# Patient Record
Sex: Female | Born: 1945 | Race: White | Hispanic: No | Marital: Married | Smoking: Former smoker
Health system: Southern US, Community
[De-identification: ages and names within clinical notes are randomized; demographics above are authoritative.]

## PROBLEM LIST (undated history)

## (undated) DIAGNOSIS — E119 Type 2 diabetes mellitus without complications: Secondary | ICD-10-CM

## (undated) DIAGNOSIS — F988 Other specified behavioral and emotional disorders with onset usually occurring in childhood and adolescence: Secondary | ICD-10-CM

## (undated) DIAGNOSIS — G4730 Sleep apnea, unspecified: Secondary | ICD-10-CM

## (undated) DIAGNOSIS — F419 Anxiety disorder, unspecified: Secondary | ICD-10-CM

## (undated) DIAGNOSIS — C50919 Malignant neoplasm of unspecified site of unspecified female breast: Secondary | ICD-10-CM

## (undated) DIAGNOSIS — R51 Headache: Secondary | ICD-10-CM

## (undated) DIAGNOSIS — R519 Headache, unspecified: Secondary | ICD-10-CM

## (undated) DIAGNOSIS — K219 Gastro-esophageal reflux disease without esophagitis: Secondary | ICD-10-CM

## (undated) DIAGNOSIS — M542 Cervicalgia: Secondary | ICD-10-CM

## (undated) DIAGNOSIS — E7800 Pure hypercholesterolemia, unspecified: Secondary | ICD-10-CM

## (undated) DIAGNOSIS — E669 Obesity, unspecified: Secondary | ICD-10-CM

## (undated) DIAGNOSIS — M25472 Effusion, left ankle: Secondary | ICD-10-CM

## (undated) DIAGNOSIS — M1990 Unspecified osteoarthritis, unspecified site: Secondary | ICD-10-CM

## (undated) DIAGNOSIS — Z923 Personal history of irradiation: Secondary | ICD-10-CM

## (undated) DIAGNOSIS — M25471 Effusion, right ankle: Secondary | ICD-10-CM

## (undated) HISTORY — PX: TUBAL LIGATION: SHX77

## (undated) HISTORY — PX: JOINT REPLACEMENT: SHX530

## (undated) HISTORY — PX: BREAST LUMPECTOMY: SHX2

## (undated) HISTORY — DX: Cervicalgia: M54.2

## (undated) HISTORY — DX: Headache, unspecified: R51.9

## (undated) HISTORY — PX: ABDOMINAL HYSTERECTOMY: SHX81

## (undated) HISTORY — PX: PAROTID GLAND TUMOR EXCISION: SHX5221

## (undated) HISTORY — PX: TONSILLECTOMY: SUR1361

## (undated) HISTORY — PX: OTHER SURGICAL HISTORY: SHX169

## (undated) HISTORY — PX: CHOLECYSTECTOMY: SHX55

## (undated) HISTORY — DX: Headache: R51

---

## 1997-10-22 ENCOUNTER — Other Ambulatory Visit: Admission: RE | Admit: 1997-10-22 | Discharge: 1997-10-22 | Payer: Self-pay | Admitting: *Deleted

## 1998-03-01 ENCOUNTER — Ambulatory Visit (HOSPITAL_COMMUNITY): Admission: RE | Admit: 1998-03-01 | Discharge: 1998-03-01 | Payer: Self-pay | Admitting: Gastroenterology

## 1999-01-06 ENCOUNTER — Other Ambulatory Visit: Admission: RE | Admit: 1999-01-06 | Discharge: 1999-01-06 | Payer: Self-pay | Admitting: *Deleted

## 1999-11-25 ENCOUNTER — Ambulatory Visit (HOSPITAL_COMMUNITY): Admission: RE | Admit: 1999-11-25 | Discharge: 1999-11-25 | Payer: Self-pay | Admitting: Family Medicine

## 2000-01-26 ENCOUNTER — Other Ambulatory Visit: Admission: RE | Admit: 2000-01-26 | Discharge: 2000-01-26 | Payer: Self-pay | Admitting: *Deleted

## 2000-06-05 ENCOUNTER — Encounter: Admission: RE | Admit: 2000-06-05 | Discharge: 2000-06-05 | Payer: Self-pay | Admitting: Family Medicine

## 2000-06-05 ENCOUNTER — Encounter: Payer: Self-pay | Admitting: Family Medicine

## 2001-01-02 ENCOUNTER — Other Ambulatory Visit: Admission: RE | Admit: 2001-01-02 | Discharge: 2001-01-02 | Payer: Self-pay | Admitting: Obstetrics and Gynecology

## 2001-02-15 ENCOUNTER — Encounter: Admission: RE | Admit: 2001-02-15 | Discharge: 2001-02-15 | Payer: Self-pay | Admitting: Family Medicine

## 2001-02-15 ENCOUNTER — Encounter: Payer: Self-pay | Admitting: Family Medicine

## 2001-02-27 ENCOUNTER — Encounter (INDEPENDENT_AMBULATORY_CARE_PROVIDER_SITE_OTHER): Payer: Self-pay | Admitting: *Deleted

## 2001-02-27 ENCOUNTER — Ambulatory Visit (HOSPITAL_COMMUNITY): Admission: RE | Admit: 2001-02-27 | Discharge: 2001-02-27 | Payer: Self-pay | Admitting: Gastroenterology

## 2002-03-07 ENCOUNTER — Ambulatory Visit (HOSPITAL_COMMUNITY): Admission: RE | Admit: 2002-03-07 | Discharge: 2002-03-07 | Payer: Self-pay | Admitting: Obstetrics and Gynecology

## 2002-03-07 ENCOUNTER — Encounter: Payer: Self-pay | Admitting: Obstetrics and Gynecology

## 2002-04-11 ENCOUNTER — Other Ambulatory Visit: Admission: RE | Admit: 2002-04-11 | Discharge: 2002-04-11 | Payer: Self-pay | Admitting: Obstetrics and Gynecology

## 2002-05-01 ENCOUNTER — Encounter (INDEPENDENT_AMBULATORY_CARE_PROVIDER_SITE_OTHER): Payer: Self-pay | Admitting: Specialist

## 2002-05-01 ENCOUNTER — Ambulatory Visit (HOSPITAL_COMMUNITY): Admission: RE | Admit: 2002-05-01 | Discharge: 2002-05-01 | Payer: Self-pay | Admitting: Obstetrics and Gynecology

## 2003-06-20 DIAGNOSIS — Z923 Personal history of irradiation: Secondary | ICD-10-CM

## 2003-06-20 HISTORY — DX: Personal history of irradiation: Z92.3

## 2003-06-22 ENCOUNTER — Encounter: Admission: RE | Admit: 2003-06-22 | Discharge: 2003-06-22 | Payer: Self-pay | Admitting: Obstetrics and Gynecology

## 2003-06-26 ENCOUNTER — Encounter (INDEPENDENT_AMBULATORY_CARE_PROVIDER_SITE_OTHER): Payer: Self-pay | Admitting: *Deleted

## 2003-06-26 ENCOUNTER — Encounter: Admission: RE | Admit: 2003-06-26 | Discharge: 2003-06-26 | Payer: Self-pay | Admitting: Obstetrics and Gynecology

## 2003-06-26 ENCOUNTER — Encounter (INDEPENDENT_AMBULATORY_CARE_PROVIDER_SITE_OTHER): Payer: Self-pay | Admitting: Obstetrics and Gynecology

## 2003-07-06 ENCOUNTER — Encounter: Admission: RE | Admit: 2003-07-06 | Discharge: 2003-07-06 | Payer: Self-pay | Admitting: General Surgery

## 2003-07-07 ENCOUNTER — Encounter (HOSPITAL_COMMUNITY): Admission: RE | Admit: 2003-07-07 | Discharge: 2003-10-05 | Payer: Self-pay | Admitting: General Surgery

## 2003-07-09 ENCOUNTER — Encounter (INDEPENDENT_AMBULATORY_CARE_PROVIDER_SITE_OTHER): Payer: Self-pay | Admitting: Specialist

## 2003-07-09 ENCOUNTER — Ambulatory Visit (HOSPITAL_COMMUNITY): Admission: RE | Admit: 2003-07-09 | Discharge: 2003-07-09 | Payer: Self-pay | Admitting: General Surgery

## 2003-07-09 ENCOUNTER — Ambulatory Visit (HOSPITAL_BASED_OUTPATIENT_CLINIC_OR_DEPARTMENT_OTHER): Admission: RE | Admit: 2003-07-09 | Discharge: 2003-07-09 | Payer: Self-pay | Admitting: General Surgery

## 2003-07-09 ENCOUNTER — Encounter: Admission: RE | Admit: 2003-07-09 | Discharge: 2003-07-09 | Payer: Self-pay | Admitting: General Surgery

## 2003-08-03 ENCOUNTER — Other Ambulatory Visit: Admission: RE | Admit: 2003-08-03 | Discharge: 2003-08-03 | Payer: Self-pay | Admitting: Obstetrics and Gynecology

## 2003-08-11 ENCOUNTER — Ambulatory Visit: Admission: RE | Admit: 2003-08-11 | Discharge: 2003-10-23 | Payer: Self-pay | Admitting: Radiation Oncology

## 2003-08-14 ENCOUNTER — Encounter: Admission: RE | Admit: 2003-08-14 | Discharge: 2003-08-14 | Payer: Self-pay | Admitting: Family Medicine

## 2003-11-12 ENCOUNTER — Ambulatory Visit: Admission: RE | Admit: 2003-11-12 | Discharge: 2003-11-12 | Payer: Self-pay | Admitting: Radiation Oncology

## 2004-02-19 ENCOUNTER — Encounter: Admission: RE | Admit: 2004-02-19 | Discharge: 2004-02-19 | Payer: Self-pay | Admitting: General Surgery

## 2004-08-04 ENCOUNTER — Other Ambulatory Visit: Admission: RE | Admit: 2004-08-04 | Discharge: 2004-08-04 | Payer: Self-pay | Admitting: Obstetrics and Gynecology

## 2004-08-04 ENCOUNTER — Encounter: Admission: RE | Admit: 2004-08-04 | Discharge: 2004-08-04 | Payer: Self-pay | Admitting: General Surgery

## 2005-09-06 ENCOUNTER — Encounter: Admission: RE | Admit: 2005-09-06 | Discharge: 2005-09-06 | Payer: Self-pay | Admitting: General Surgery

## 2005-10-26 ENCOUNTER — Other Ambulatory Visit: Admission: RE | Admit: 2005-10-26 | Discharge: 2005-10-26 | Payer: Self-pay | Admitting: Obstetrics and Gynecology

## 2005-11-14 ENCOUNTER — Encounter: Admission: RE | Admit: 2005-11-14 | Discharge: 2005-11-14 | Payer: Self-pay | Admitting: General Surgery

## 2006-01-04 ENCOUNTER — Ambulatory Visit (HOSPITAL_COMMUNITY): Admission: RE | Admit: 2006-01-04 | Discharge: 2006-01-05 | Payer: Self-pay | Admitting: Obstetrics and Gynecology

## 2006-01-04 ENCOUNTER — Encounter (INDEPENDENT_AMBULATORY_CARE_PROVIDER_SITE_OTHER): Payer: Self-pay | Admitting: *Deleted

## 2006-08-15 ENCOUNTER — Ambulatory Visit (HOSPITAL_COMMUNITY): Admission: RE | Admit: 2006-08-15 | Discharge: 2006-08-15 | Payer: Self-pay | Admitting: Family Medicine

## 2006-08-19 ENCOUNTER — Ambulatory Visit: Payer: Self-pay | Admitting: Internal Medicine

## 2006-10-15 ENCOUNTER — Encounter: Admission: RE | Admit: 2006-10-15 | Discharge: 2006-10-15 | Payer: Self-pay | Admitting: Obstetrics and Gynecology

## 2006-12-17 ENCOUNTER — Ambulatory Visit: Payer: Self-pay | Admitting: Internal Medicine

## 2006-12-17 ENCOUNTER — Observation Stay (HOSPITAL_COMMUNITY): Admission: EM | Admit: 2006-12-17 | Discharge: 2006-12-19 | Payer: Self-pay | Admitting: Emergency Medicine

## 2006-12-18 ENCOUNTER — Encounter (INDEPENDENT_AMBULATORY_CARE_PROVIDER_SITE_OTHER): Payer: Self-pay | Admitting: Hospitalist

## 2006-12-24 ENCOUNTER — Encounter (HOSPITAL_COMMUNITY): Admission: RE | Admit: 2006-12-24 | Discharge: 2006-12-25 | Payer: Self-pay | Admitting: Internal Medicine

## 2007-10-17 ENCOUNTER — Encounter: Admission: RE | Admit: 2007-10-17 | Discharge: 2007-10-17 | Payer: Self-pay | Admitting: Obstetrics and Gynecology

## 2008-10-09 ENCOUNTER — Encounter: Admission: RE | Admit: 2008-10-09 | Discharge: 2008-10-09 | Payer: Self-pay | Admitting: Sports Medicine

## 2008-10-20 ENCOUNTER — Encounter: Admission: RE | Admit: 2008-10-20 | Discharge: 2008-10-20 | Payer: Self-pay | Admitting: Obstetrics and Gynecology

## 2010-07-09 ENCOUNTER — Encounter: Payer: Self-pay | Admitting: Obstetrics and Gynecology

## 2010-11-01 NOTE — Discharge Summary (Signed)
NAME:  [name], [name]              ACCOUNT NO.:  [id number]   MEDICAL RECORD NO.:  [id number]          PATIENT TYPE:  OBV   LOCATION:  4739                         FACILITY:  [facility]   PHYSICIAN:  Jason Coop, MD DATE OF BIRTH:  October 15, 1945   DATE OF ADMISSION:  12/17/2006  DATE OF DISCHARGE:  12/19/2006                               DISCHARGE SUMMARY   DISCHARGE DIAGNOSES:  1. Atypical chest pain.  2. Hypokalemia.  3. Questionable hyperthyroidism.  4. Osteopenia.  5. Decreased vitamin D.  6. Depression.  7. Attention deficit disorder.  8. Obstructive sleep apnea.   MEDICATIONS ON DISCHARGE:  1. Aspirin 325 mg PO daily.  2. Lipitor 40 mg PO daily.  3. Lopressor 25 mg PO b.i.d.  4. Nitroglycerin 25 mg 1 spray for chest pain, can repeat in 5      minutes, total times 3. If still persistent call 911.  5. Calcium and vitamin D  take 1 pill p.o. tid.  6. Nasonex spray 2 sprays daily.  7. Protonix 40 mg PO daily.  8. Ritalin 10 mg PO daily.   DISPOSITION:  She is discharged home.  She will be seen by her primary  care physician Dr. Purnell Shoemaker and the patient will schedule the appointment  herself. She needs follow up on HTN and anti-hypertensive medications,  any persisting signs of chest pain,  any persisting signs of  hyperthyroidism and depression. She is scheduled for radioactive iodine  uptake on December 24, 2006 and will follow up with her PCP.   PROCEDURE:  1. 2D echo, dated July 1, ejection fraction of around 60-70%.  No left      ventricular regional wall motion abnormality.  The left ventricle      diastolic parameters are normal.  Left atrial size is normal.  2. Myoview shows normal left ventricular function with an estimated      ejection fraction of 71%.  No evidence of ischemia, apical thinning      was present.   CONSULTATIONS:  Cardiology.   BRIEF HISTORY ON ADMISSION:  She is a 65 year old female with past  history significant for OSA, hiatal hernia, right  breast cancer,  presented with a 24 hour history of chest pain.  It was central, 6-7/10,  nonradiating, dull and it started while she was eating breakfast.  It  occurred multiple times over the last 24 hours, each time lasting 1-2  minutes.  There is no aggravating or alleviating factor.  No associated  nausea or vomiting.  It does spread to her right side and patient took  nitroglycerin spray and aspirin, but there was no significant relief.   EXAMINATION:  VITAL SIGNS: Temperature 98.3.  BP 112/42.  Pulse 68-70.  Oxygen saturation 90% on 2 liters.  GENERAL:  She is obese and alert.  Her BMI is 36.  NECK:  JVD is negative.  RESPIRATORY SYSTEM:  Clear to auscultation bilaterally.  CARDIOVASCULAR:  Normal.  Regular rate and rhythm.  First and second  heart sound normal.  EXTREMITIES:  No edema, swelling.  No cyanosis.   LABS ON ADMISSION:  WBC  6.9, hemoglobin 12.6, hematocrit 37.3, platelets  264.  Sodium 140, potassium 3.3, chloride 104, bicarb 27, BUN 14,  creatinine 0.8 and glucose 106. PT 13, PTT 33, INR 1.0.  D-dimer is  0.38.  Cardiac enzyme WNL.  Magnesium 1.9.  Her TSH is 0.005, but free  T4 normal at 1.21 and her T3 3.1.  Her EKG was normal at admission.   HOSPITAL COURSE:  1. Chest Pain: We followed with repeated EKG and cardiac enzymes.      Cardiac enzymes were WNL and EKG was negative for new changes. 2D      echo and Myoview were WNL  and had ejection fraction of 60-70, no      wall motion abnormalities and no evidence of ischemia.  2. Questionable hyperthyroidism: She is scheduled her for radioactive      iodine uptake and she will follow it with her primary care      physician.  3. Hypokalemia: On potassium and her potassium at discharge is 3.6.  4. OSA:  On CPAP  5. For depression we continued her previous medication.  6. Attention deficit disorder: Ritalin.  7. Vitamin D deficiency and her osteopenia:  On calcium and vitamin D.   DISCHARGE LABS:  Prolactin 7.5.   FSH 77.8.  Lipid profile:  TG 185, HDL  54, LDL 128, VLDL 37,  potassium 3.6 and creatinine 0.76.   Vitals on discharge  T: 98.1 BP 110/58 P: 58 and R: 18 Oxygen sat 95 on RA      Jason Coop, MD  Electronically Signed     YP/MEDQ  D:  12/20/2006  T:  12/21/2006  Job:  [id number]   cc:   Lianne Bushy, M.D.

## 2010-11-04 NOTE — Op Note (Signed)
NAME:  [name], [name]                        ACCOUNT NO.:  [id number]   MEDICAL RECORD NO.:  [id number]                   PATIENT TYPE:  AMB   LOCATION:  DSC                                  FACILITY:  [facility]   PHYSICIAN:  Rose Phi. Young, M.D.                DATE OF BIRTH:  [birth year]   DATE OF PROCEDURE:  07/09/2003  DATE OF DISCHARGE:                                 OPERATIVE REPORT   PREOPERATIVE DIAGNOSIS:  Intraductal carcinoma of the right breast.   POSTOPERATIVE DIAGNOSIS:  Intraductal carcinoma of the right breast.   PROCEDURE:  Right partial mastectomy with needle localization and specimen  mammography.   SURGEON:  Rose Phi. Maple Hudson, M.D.   ANESTHESIA:  General.   DESCRIPTION OF PROCEDURE:  Prior to coming to the operating room, the  patient had a wire localization of the lesion in the medial portion of her  right breast.   After suitable general anesthesia was induced, the patient was placed in the  supine position and the right breast prepped and draped in the usual  fashion.  A radial incision incorporating a wedge of skin using the  previously placed wire as a guide was then outlined and made and then a wide  excision of the wire and surrounding tissue was carried out.  Specimen  mammography confirmed the removal of the lesion.  Hemostasis was obtained  with the cautery.  We then infiltrated the area with 0.25% Marcaine; 3-0  Vicryl and then subcuticular 4-0 Monocryl sutures were used to close the  wound and Steri-Strips were applied.  A dressing was then applied and the  patient transferred to the recovery room in satisfactory condition having  tolerated the procedure well.                                               Rose Phi. Maple Hudson, M.D.    PRY/MEDQ  D:  07/09/2003  T:  07/09/2003  Job:  [id number]

## 2010-11-04 NOTE — H&P (Signed)
   NAME:  [name], [name] NO.:  [id number]   MEDICAL RECORD NO.:  [id number]                   PATIENT TYPE:   LOCATION:                                       FACILITY:   PHYSICIAN:  Huel Cote, M.D.              DATE OF BIRTH:   DATE OF ADMISSION:  05/01/2002  DATE OF DISCHARGE:                                HISTORY & PHYSICAL   HISTORY OF PRESENT ILLNESS:  The patient is a 65 year old G2 P2 who is  admitted to undergo an operative hysteroscopy, dilatation and curettage  given a history of postmenopausal bleeding and a thickened endometrial  stripe on ultrasound.  The patient first began to experience postmenopausal  bleeding in September 2003, and at that point underwent an endometrial  biopsy which revealed disordered proliferative endometrium with a stripe on  ultrasound measuring 1.5 cm.  She has continued to have spotting although no  heavy bleeding.   PAST MEDICAL HISTORY:  1. History of a parotid tumor.  2. History of borderline hypercholesterolemia.   PAST SURGICAL HISTORY:  1. Cholecystectomy.  2. Parotid tumor resection.  3. Bilateral tubal ligation.  4. D&C.   PAST OBSTETRICAL HISTORY:  She had two normal spontaneous vaginal  deliveries.   PAST GYNECOLOGICAL HISTORY:  History of cervical conization approximately 15  years ago, with normal Pap smears after that point.   MEDICATIONS:  Prempro.  Wellbutrin.  Hydrochlorothiazide.  Allegra.   FAMILY HISTORY:  Significant for breast cancer in two maternal aunts and  colon cancer in her father.  Her mother also had a history of colon polyps.   ALLERGIES:  The patient has no known drug allergies.   PHYSICAL EXAMINATION:  VITAL SIGNS:  Blood pressure 130/90.  Weight 202  pounds.  BREASTS:  Normal.  With no masses, discharge, or adenopathy noted.  CARDIAC:  Regular rate and rhythm.  LUNGS:  Clear.  ABDOMEN:  Soft and nontender.  PELVIC:  Normal external genitalia.  Cervix is  normal, with no lesions.  The  uterus is normal in size.  The adnexa have no masses.  RECTAL:  Heme-negative.    PLAN:  The patient was counseled of the risks and benefits of operative  hysteroscopy including bleeding and infection and possible uterine  perforation.  She understands these risks and is desirous of proceeding with  the surgery as stated.  If there is any uterine pathology including a polyp  or submucosal fibroid, she understands we will resect that at the time of  surgery.                                               Huel Cote, M.D.    KR/MEDQ  D:  04/25/2002  T:  04/25/2002  Job:  [id number]

## 2010-11-04 NOTE — Op Note (Signed)
NAME:  [name], [name]              ACCOUNT NO.:  [id number]   MEDICAL RECORD NO.:  [id number]          PATIENT TYPE:  INP   LOCATION:  9399                          FACILITY:  [facility]   PHYSICIAN:  Huel Cote, M.D. DATE OF BIRTH:  06-16-1946   DATE OF PROCEDURE:  01/04/2006  DATE OF DISCHARGE:                                 OPERATIVE REPORT   PREOP DIAGNOSIS:  1.  Pelvic pain.  2.  Dyspareunia.  3.  Left ovarian mass.   POSTOP DIAGNOSIS:  1.  Pelvic pain.  2.  Dyspareunia.  3.  Left ovarian mass.  4.  Apparent simple cyst of left ovary.   PROCEDURE:  1.  Laparoscopic-assisted vaginal hysterectomy.  2.  Bilateral salpingo-oophorectomy and posterior repair.   SURGEON:  Huel Cote, M.D.   ASSISTANT:  Zenaida Niece, M.D.   ANESTHESIA:  General.   SPECIMENS:  Uterus, tubes, ovaries, and cervix were sent to pathology.   ESTIMATED BLOOD LOSS:  250 mL.  Urine output 200 mL clear urine.  IV fluids  2500 LR.   COMPLICATIONS:  Just as the patient was getting ready to be moved off the  table, there was some bleeding noted from the vagina which seemed excessive;  therefore, the packing was removed; and the vagina reinspected.  There was  found to be a small arterial bleeder along the posterior repair which  required two additional figure-of-eight sutures, and gained excellent  hemostasis, at this point; therefore, the packing was replaced and the  patient awakened.   FINDINGS:  There was a small uterus.  The left ovary had an apparent simple  cyst approximately 2 cm in size.  The right ovary was normal.  Posterior  repair was necessary given a large rectocele.  There was no significant  cystocele noted; after the removal of the cervix and uterus.   DESCRIPTION OF PROCEDURE:  The patient was taken to operating room where  general anesthesia was obtained without difficulty.  She was then prepped  and draped in the normal sterile fashion in the dorsal lithotomy  position.  The patient had a Foley catheter placed and attention was then turned to her  abdomen where infraumbilical incision was made with the scalpel after  injection with 1/4% Marcaine.  A Veress needle was then placed into the  peritoneal cavity with intraperitoneal placement confirmed by both  aspiration injection with normal saline.   The gas flow was then applied and a pneumoperitoneum obtained with  approximately 2.5 liters of CO2 gas.  The Excel 5-mm trocar was then placed  under direct visualization with the camera in place; and the peritoneal  cavity entered easily.  Two additional ports were then placed under direct  visualization 5-mm in size; in both the right and left mid quadrant area.  The patient was then placed in steep Trendelenburg; and as could best be  performed her bowel was swept away from the pelvic area.  This was somewhat  redundant and difficult to completely get out of the way.  The tube and  ovary were then grasped on the patient's left and  retracted medially,  exposing the infundibulopelvic ligament.  This was then taken down with the  harmonic scalpel, sequentially; and the remainder of the broad ligament and  round ligament taken down with the Harmonic as well, was slightly difficult  secondary to poor visualization, with the bowel having to be constantly  pushed away; however, this was accomplished down to the level of the bladder  flap.  The bladder flap was then created with the harmonic as well.   Attention was then turned to the patient's right where similarly the  infundibulopelvic ligament was exposed; and the right tube and ovary taken  down with the harmonic scalpel.  Good hemostasis was obtained; and this was  taken down to the level of the bladder flap, as well.  The Hulka tenaculum  was then utilized to retract the uterine fundus directly; and this did  assist with completing the bladder flap.  The bladder was then pushed away  nicely; and  no further dissection was felt possible; therefore, all  instruments were removed; and the trocar was left in place.  The gas was  turned off, and the abdomen covered.  Attention was then turned vaginally  where a weighted speculum was placed within the vagina.  The cervix was then  identified and though it was quite small in nature secondary to previous  conization; it was able to be grasped with [place] tenaculums x2.  This was  retracted downwards; and the loose solution of Pitressin injected  circumferentially around the cervix.  The Bovie cautery was then utilized to  circumferentially incise the cervix, and push away the overlying vaginal  mucosa.  The Mayo scissors were then utilized to enter the posterior cul-de-  sac; and the banana speculum placed within it.  Two bites were then taken,  the uterosacral ligaments were taken with the Zeppelin clamp, transected,  and suture ligated with #0 Vicryl bilaterally.  These were held in  hemostats.  Attention was then turned anteriorly where the anterior cul-de-  sac was then entered bluntly.  The bladder and rectum then isolated from the  uterus.   The remaining broad ligament was taken down bilaterally with a Zeppelin  clamp; and each segment was transected and suture ligated with #0 Vicryl.  Once the previous dissection, laparoscopically, had been reached; the uterus  was removed in its entirety.  A small area of bleeding along the right cuff  angle was controlled with a figure-of-eight suture of #0 Vicryl.  There is  no active bleeding noted from the anterior pedicles,  therefore, the short  weighted retractor was then placed within the vagina; and the Bonano  removed.  The posterior cuff was then run with a running locked suture of 2-  0 Vicryl and good hemostasis noted.  The uterosacral ligaments were then  reapproximated to one another; and the anterior vagina inspected.  There was no obvious cystocele noted.  Therefore, the vaginal  cuff was closed with 2-0  Vicryl in a running locked fashion.  This appeared hemostatic.   Attention was then returned posteriorly, where the posterior vagina was  grasped at each sulcal area at the introitus with Allis clamps.  A small  amount of mucosa was then trimmed away with Mayo scissors.  The mucosa,  itself, was then underscored after being injected with a dilute solution of  Pitressin.  This was underscored; and taken up the midline of the posterior  vagina, until it reached the vaginal cuff.  Once the vaginal  mucosa was  retracted laterally, the underlying rectovaginal fascia was trimmed away  from the mucosal flaps; and retracted medially, exposing the rectocele.  The  rectovaginal fascia was then reapproximated with several interrupted sutures  of #0 Vicryl; and the rectocele completely reduced.  The mucosal flaps were  then trimmed with Mayo scissors; and the mucosa overlying repaired with 2-0  Vicryl in a running locked suture.  Vaginal packing was then placed; and  attention was returned to the patient's abdomen.  Pneumoperitoneum was, once  again, obtained; and the camera was introduced into the patient's abdomen  and pelvis.  The pelvis was inspected with only two small areas of bleeding  noted, which were controlled with Kleppinger cautery.  These were along the  patient's left vaginal cuff angle.  There was no significant bleeding noted  further at that time; therefore, the ports were removed under direct  visualization, pneumoperitoneum reduced, and the umbilical port then removed  as well.  The incisions were then closed with 1 deep suture of #0 Vicryl at  the umbilicus as well as a 3-0 subcuticular stitch at each incision.   Sponge lap, and needle counts were correct x2; and preparations were made to  awaken the patient.  When the drapes were removed, and we were preparing to  remove the patient; it was noted that she had excessive vaginal bleeding;  and, therefore,  she was kept asleep.  The vaginal packing was removed; and  the vagina reinspected.  There was a small arterial bleeder identified along  the posterior repair which required two additional figure-of-eight sutures  of #0 Vicryl to control.  At this point, there was no other significant  active bleeding noted; and the vagina was repacked with Estrace coated  gauze.  Again, sponge, lap, and needle counts were correct; and the patient  was then awakened; and taken to the recovery room in good condition      Huel Cote, M.D.  Electronically Signed     KR/MEDQ  D:  01/04/2006  T:  01/04/2006  Job:  [id number]

## 2010-11-04 NOTE — H&P (Signed)
NAME:  [name], [name]              ACCOUNT NO.:  [id number]   MEDICAL RECORD NO.:  [id number]          PATIENT TYPE:  AMB   LOCATION:  SDC                           FACILITY:  [facility]   PHYSICIAN:  Huel Cote, M.D. DATE OF BIRTH:  1946/01/21   DATE OF ADMISSION:  01/04/2006  DATE OF DISCHARGE:                                HISTORY & PHYSICAL   PREOPERATIVE HISTORY AND PHYSICAL   DATE OF SURGERY:  January 04, 2006   The patient is a 65 year old G2 P2 who is coming in for an attempted  laparoscopic assisted vaginal hysterectomy and bilateral salpingo-  oophorectomy with a anterior/posterior repair.  She also has a good  possibility of requiring an abdominal hysterectomy.  The patient has had an  ongoing problem with dyspareunia with a persistent left-sided pain which has  been present for several years.  She actually has foregone sexual activity  related to this pain and recently was discovered to have a ovarian cyst  which appeared benign on the left.  This could not be clearly delineated due  to the patient's body habitus on ultrasound and the patient has significant  worry regarding ovarian cancer given a personal history of breast cancer as  well.  Given her longstanding issues with dyspareunia and pelvic pain of  unclear etiology the patient wishes to proceed with a complete hysterectomy,  both to solve the problem of the pelvic pain and to remove her ovaries in  terms of any future ovarian cancer risk.   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  She also has a history of a  parotid tumor and borderline cholesterol.   PAST SURGICAL HISTORY:  Includes cholecystectomy, parotid tumor, tubal  ligation, dilatation and curettage, and hysteroscopy in November 2003 with a  polyp and focal hyperplasia noted at that time.   PAST OBSTETRICAL HISTORY:  She has had 2 vaginal deliveries; one was a  breech delivery.   PAST GYN HISTORY:  History of colonization with Pap smears normal since  approximately 15 years ago.   FAMILY HISTORY:  Shows a breast cancer in 2 maternal aunts.  She has no  heart disease.  She has colon cancer in her father.  Mother has lung cancer  and polyps.   PHYSICAL EXAMINATION:  VITAL SIGNS:  The patient's weight is 217 pounds,  blood pressure 110/80.  BREAST EXAM:  Shows no masses, discharge, or adenopathy.  CARDIAC EXAM:  Regular rate and rhythm.  LUNGS:  Clear.  ABDOMEN:  Soft and nontender.  She has a redundant vagina with a grade 3  rectocele which is noted and visible at the introitus with Valsalva  maneuver.  She also has a grade 2-3 cystocele.  Uterus is small and the  adnexa are not palpable.  Cervix is also slightly small from her history of  multiple conizations.  She does have some descensus although it is not quite  as substantial as would be hoped with her redundant vagina.   CURRENT MEDICATIONS:  1.  Celexa.  2.  Strattera.  3.  Hydrochlorothiazide.  4.  Doxycycline.  5.  Flonase.  6.  Allegra.   ALLERGIES:  She has no specific drug allergies.   Recent CA125 was performed and was noted to be normal at 10.3.  The patient  also has significant prolapse of rectocele and cystocele.  She does wear  Poise pads daily and has urinary leakage which is not always associated with  exertion.  She does have some stress urinary incontinence but this seems to  be inconsistent.  Several options were reviewed with the patient who  adamantly desires a hysterectomy given her longstanding issues with pelvic  pain and her fear of ovarian cancer.  We discussed possibilities of  continued surveillance, although this would certainly not provide her any  relief from her dyspareunia and pelvic pain and given her additional  prolapse noted in the vagina it is certainly reasonable to be proceed with  surgery.  We discussed multiple options including abdominal laparoscopic   assisted vaginal  hysterectomy and a laparoscopic supracervical  hysterectomy.  The patient chooses to proceed with a laparoscopic assisted  vaginal hysterectomy as she wishes complete removal of her cervix given her  past history of multiple conizations and for that reason wishes the cervix  removed.  Given the uterus is small in size we will attempt a laparoscopic  assisted vaginal hysterectomy and if it proves too difficult given her  redundant vagina and difficult descensus convert to an abdominal  hysterectomy as needed.  The patient was encouraged to proceed with further  urological evaluation regarding her bladder symptoms as they appear to be a  mixed incontinence pattern and not specifically stress urinary incontinence.  It was advised that she may wish to delay the surgery and get Urology input  to see if she would benefit from any additional procedure with a sling at  the time of surgery.  The patient declined this evaluation and wishes to  proceed with surgery as stated with only workup of that if it is a more  persistent problem after the surgery.  She is aware that it could in fact  worsen with the hysterectomy.  We discussed multiple risks and benefits  including bleeding, infection, and possible damage to bowel and bladder.  She understands that any of these complications would likely require a  larger abdominal incision and is okay with this.      Huel Cote, M.D.  Electronically Signed     KR/MEDQ  D:  01/03/2006  T:  01/03/2006  Job:  [id number]

## 2010-11-04 NOTE — Op Note (Signed)
NAME:  [name], [name]                        ACCOUNT NO.:  [id number]   MEDICAL RECORD NO.:  [id number]                   PATIENT TYPE:  AMB   LOCATION:  DAY                                  FACILITY:  [facility]   PHYSICIAN:  Huel Cote, M.D.              DATE OF BIRTH:  1945/11/07   DATE OF PROCEDURE:  05/01/2002  DATE OF DISCHARGE:                                 OPERATIVE REPORT   PREOPERATIVE DIAGNOSES:  1. Postmenopausal bleeding.  2. Normal endometrial biopsy.  3. Thickened endometrial stripe on ultrasound.   POSTOPERATIVE DIAGNOSES:  1. Postmenopausal bleeding.  2. Normal endometrial biopsy.  3. Thickened endometrial stripe on ultrasound.  4. Endometrial polyp.   SURGEON:  Huel Cote, M.D.   ANESTHESIA:  General with LMA.   ESTIMATED BLOOD LOSS:  100 cc.   URINE OUTPUT:  200 cc prior to surgery.   INTRAVENOUS FLUIDS:  2000 cc LR.  Hysteroscopic deficit was 200 cc of  Sorbitol.   FINDINGS:  There was an endometrial polyp which was removed in fragments  from the right lateral uterine fundus.   PROCEDURE:  The patient was taken to the operating room where general  anesthesia was obtained without difficulty and an LMA placed. She was then  prepped and draped in the normal sterile fashion in the dorsal lithotomy  position. A speculum was then placed within the vagina and the cervix was  grasped with the single tooth tenaculum and the cervix sequentially dilated  to approximately 30 with the [facility] dilators.   The hysteroscope was then introduced into the uterus and with some  difficulty, the cavity was visualized, although a large endometrial polyp  obscured most of the cavity and the camera's view.  Because the base of the  polyp could not be clearly visualized and obscured the field, the  hysteroscope was removed and the polyp forceps introduced into the uterine  cavity. Several fragments of polyp were removed with the polyp forceps and  handed off to  pathology.   The hysteroscope was then reintroduced into the uterus and the cavity was  slightly better visualized. Several additional fragments of polyp were  shaved away with the resectoscope under direct visualization at the base of  the polyp. At the conclusion of the procedure endometrial curettage was  performed to remove any remaining fragments of polyp and these were also  handed to pathology.   The hysteroscope was introduced a final time into the uterine cavity and the  cavity appeared essentially  clear of any further large polyp fragments.  Therefore, the hysteroscope was removed and the tenaculum and speculum were  removed from the patient's vagina. There was a small area of bleeding at the  tenaculum site which was controlled with grasping  with a ring forceps and  clamping for several minutes.   At the conclusion of the procedure, there was no significant active bleeding  from the  vagina and therefore all instruments and sponges were removed from  the vagina and the patient was  awakened and transferred to the recovery  room in stable condition.                                                       Huel Cote, M.D.    KR/MEDQ  D:  05/01/2002  T:  05/01/2002  Job:  [id number]

## 2010-11-04 NOTE — Procedures (Signed)
NAME:  [name], [name]              ACCOUNT NO.:  [id number]   MEDICAL RECORD NO.:  [id number]          PATIENT TYPE:  OUT   LOCATION:  SLEEP CENTER                 FACILITY:  [facility]   PHYSICIAN:  Clinton D. Maple Hudson, MD, FCCP, FACPDATE OF BIRTH:  1945/12/13   DATE OF STUDY:  08/15/2006                            NOCTURNAL POLYSOMNOGRAM   INDICATION FOR STUDY:  Hypersomnia with sleep apnea.   EPWORTH SLEEPINESS SCORE:  15/24, BMI 35.8, weight 210 pounds.   MEDICATIONS:  Home medications are listed and reviewed, significant for  Ritalin and citalopram.   SLEEP ARCHITECTURE:  Total sleep time 367 minutes with sleep efficiency  84%.  Stage 1 was 9%, stage II 79%, stages III and IV 2%, REM 10% of  total sleep time.  Sleep latency 22 minutes, REM latency 217 minutes,  awake after sleep onset 50 minutes, arousal index 17.  No bedtime  medication was taken.   RESPIRATORY DATA:  Split study protocol.  Apnea-hypopnea index (AHI,  RDI) 38.2 obstructive events per hour, indicating moderately severe  obstructive sleep apnea/hypopnea syndrome before CPAP.  There were 33  obstructive apneas and 47 hypopneas before CPAP.  Most events occurred  while sleeping supine.  REM AHI 8.1.  CPAP control appeared to be  achieved at 8 CWP with an AHI of 3.1 per hour.  However, because of  persistent snoring the technician continued advancing pressures with  progressively higher AHI to a final CPAP of 24 CWP.  This may have  reflected increasing nasal congestion as airflow was increased.  A  medium ResMed Mirage Quattro mask was used with heated humidifier.   OXYGEN DATA:  Loud snoring with oxygen desaturation to a nadir of 82%.  Oxygen saturation with CPAP control was at 94% on room air.   CARDIAC DATA:  Normal sinus rhythm.   MOVEMENT-PARASOMNIA:  Occasional limb jerk with arousal, of doubtful  significance.   IMPRESSIONS-RECOMMENDATIONS:  1. Moderately severe obstructive sleep apnea/hypopnea syndrome,  apnea-      hypopnea index 38.2 per hour with events most common while supine,      but not exclusively related to supine sleep position.  Loud snoring      with oxygen desaturation to a nadir of 82%.  2. CPAP control was initially achieved at 8 CWP, apnea-hypopnea index      3.1 per hour, and this will be the recommended initial therapeutic      setting for home trial.  A medium ResMed Ultra Mirage full face      mask was      used with a heated humidifier.  Higher pressures were tried and      seemed associated with worsening scores consistent with increasing      nasal congestion.      Clinton D. Maple Hudson, MD, [facility], FACP  Diplomate, Biomedical engineer of Sleep Medicine  Electronically Signed     CDY/MEDQ  D:  08/19/2006 11:02:56  T:  08/19/2006 16:24:56  Job:  [id number]

## 2010-11-04 NOTE — Op Note (Signed)
[place]  Patient:    [name], [name] Visit Number: [id number] MRN: [id number]          Service Type: END Location: ENDO Attending Physician:  Nelda Marseille Dictated by:   Petra Kuba, M.D. Proc. Date: 02/27/01 Admit Date:  02/27/2001   CC:         Maricela Bo, M.D.   Operative Report  PROCEDURE PERFORMED:  Colonoscopy.  ENDOSCOPIST:  Petra Kuba, M.D.  INDICATIONS FOR PROCEDURE:  Patient with history of colon polyps.  Family history of colon cancer both sides of the family, due for colonic screening.  Consent was signed after risks, benefits, methods, and options were thoroughly discussed in the office on multiple occasions.  MEDICATIONS USED:  Demerol 75 mg, Versed 10 mg.  DESCRIPTION OF PROCEDURE:  Rectal inspection was pertinent for external hemorrhoids.  Digital exam was negative.  Video colonoscope was inserted and easily advanced around the colon to the cecum.  This did require some abdominal pressure and rolling her on her back.  No obvious abnormalities seen on insertion.  The cecum was identified by the appendiceal orifice and the ileocecal valve.  The prep was adequate.  There was some liquid stool that required washing and suctioning.  On slow withdrawal through the colon, cecum, ascending transverse and majority of the left of the colon was normal.  There was an occasional left-sided diverticulum.  There was also in the sigmoid and rectum some tiny hyperplastic appearing polyps, a few of which were cold biopsies.  The scope was retroflexed revealing some internal hemorrhoids. Scope was straightened, air was withdrawn and the scope removed.  The patient tolerated the procedure well.  There was no obvious immediate complication.  ENDOSCOPIC DIAGNOSIS: 1. Internal and external hemorrhoids. 2. Left occasional diverticula. 3. Some rectal and distal sigmoid tiny hyperplastic-appearing polyps,    cold  biopsies. 4. Otherwise within normal limits to the cecum.  PLAN:  Await pathology.  Will probably recheck colon screening in four to five years.  GI follow-up p.r.n.  Otherwise yearly rectals and guaiacs per Dr. Purnell Shoemaker as well as other health care maintenance. Dictated by:   Petra Kuba, M.D. Attending Physician:  Nelda Marseille DD:  02/27/01 TD:  02/27/01 Job: [id number]

## 2011-03-13 ENCOUNTER — Other Ambulatory Visit: Payer: Self-pay | Admitting: Gastroenterology

## 2011-04-04 LAB — CBC
HCT: 36.9
Hemoglobin: 12.4
MCV: 89.3
RBC: 4.13
WBC: 4.8

## 2011-04-04 LAB — CARDIAC PANEL(CRET KIN+CKTOT+MB+TROPI)
CK, MB: 1.1
Relative Index: INVALID
Troponin I: 0.01

## 2011-04-04 LAB — BASIC METABOLIC PANEL
Chloride: 102
GFR calc Af Amer: >60
Potassium: 3.6
Sodium: 143

## 2011-04-04 LAB — T3, FREE: T3, Free: 3.1 (ref 2.3–4.2)

## 2011-04-04 LAB — LIPID PANEL: VLDL: 37

## 2011-04-04 LAB — ACTH: C206 ACTH: 9 — ABNORMAL LOW

## 2011-04-05 LAB — I-STAT 8, (EC8 V) (CONVERTED LAB)
Acid-Base Excess: 1
BUN: 14
Bicarbonate: 26.5 — ABNORMAL HIGH
Chloride: 106
Glucose, Bld: 106 — ABNORMAL HIGH
HCT: 39
Hemoglobin: 13.3
Operator id: 288331
Potassium: 3.3 — ABNORMAL LOW
Sodium: 140
TCO2: 28
pCO2, Ven: 46.5
pH, Ven: 7.364 — ABNORMAL HIGH

## 2011-04-05 LAB — POCT CARDIAC MARKERS
CKMB, poc: <1 — ABNORMAL LOW
CKMB, poc: <1 — ABNORMAL LOW
Myoglobin, poc: 68.7
Myoglobin, poc: 76.4
Operator id: 151321
Operator id: 288331
Troponin i, poc: <0.05
Troponin i, poc: <0.05

## 2011-04-05 LAB — URINALYSIS, ROUTINE W REFLEX MICROSCOPIC
Ketones, ur: NEGATIVE
Nitrite: NEGATIVE
pH: 7

## 2011-04-05 LAB — BASIC METABOLIC PANEL
BUN: 11
CO2: 29
Chloride: 105
Creatinine, Ser: 0.81
Glucose, Bld: 87

## 2011-04-05 LAB — POCT I-STAT CREATININE
Creatinine, Ser: 0.8
Operator id: 288331

## 2011-04-05 LAB — BASIC METABOLIC PANEL WITH GFR
Calcium: 9.3
GFR calc Af Amer: >60
GFR calc non Af Amer: >60
Potassium: 3.5
Sodium: 142

## 2011-04-05 LAB — CBC
HCT: 37.3
Hemoglobin: 12.6
MCHC: 33.8
MCV: 89.9
Platelets: 264
RBC: 4.15
RDW: 13.5
WBC: 6.9

## 2011-04-05 LAB — TSH: TSH: 0.005 — ABNORMAL LOW

## 2011-04-05 LAB — APTT: aPTT: 33

## 2011-04-05 LAB — MAGNESIUM: Magnesium: 1.9

## 2011-04-05 LAB — CK TOTAL AND CKMB (NOT AT ARMC)
CK, MB: 1.4
Relative Index: INVALID
Total CK: 76

## 2011-04-05 LAB — CARDIAC PANEL(CRET KIN+CKTOT+MB+TROPI): Total CK: 90

## 2011-04-05 LAB — PROTIME-INR
INR: 1
Prothrombin Time: 13

## 2011-04-05 LAB — TROPONIN I: Troponin I: <0.01

## 2011-04-05 LAB — D-DIMER, QUANTITATIVE: D-Dimer, Quant: 0.38

## 2011-04-05 LAB — RAPID URINE DRUG SCREEN, HOSP PERFORMED
Cocaine: NOT DETECTED
Tetrahydrocannabinol: NOT DETECTED

## 2011-04-23 ENCOUNTER — Emergency Department (HOSPITAL_COMMUNITY)
Admission: EM | Admit: 2011-04-23 | Discharge: 2011-04-23 | Disposition: A | Discharge status: Home / Self Care | Payer: Worker's Compensation | Attending: Emergency Medicine | Admitting: Emergency Medicine

## 2011-04-23 ENCOUNTER — Emergency Department (HOSPITAL_COMMUNITY): Payer: Worker's Compensation

## 2011-04-23 DIAGNOSIS — M25521 Pain in right elbow: Secondary | ICD-10-CM

## 2011-04-23 DIAGNOSIS — M79609 Pain in unspecified limb: Secondary | ICD-10-CM | POA: Insufficient documentation

## 2011-04-23 DIAGNOSIS — M25529 Pain in unspecified elbow: Secondary | ICD-10-CM | POA: Insufficient documentation

## 2011-04-23 NOTE — ED Provider Notes (Signed)
History     CSN: [id number] Arrival date & time: 04/23/2011 12:06 PM   First MD Initiated Contact with Patient 04/23/11 1437      Chief Complaint  Patient presents with  . Arm Pain    (Consider location/radiation/quality/duration/timing/severity/associated sxs/prior treatment) Patient is a 65 y.o. female presenting with arm injury.  Arm Injury  The incident occurred yesterday. The injury mechanism was a pulled limb.   Pt presents to the ED with complaints of right elbow pain. The patient says that he went to grab a heavy table that was falling yesterday when she reached out with her right arm to grab it and immediately felt severe right elbow pain. The pain died down a bit after she put it in a sling last night. However, this morning she has noticed that she has not been able to move her arm appropriately.    History reviewed. No pertinent past medical history.  Past Surgical History  Procedure Date  . Abdominal hysterectomy   . Breast lumpectomy   . Cholecystectomy     History reviewed. No pertinent family history.  History  Substance Use Topics  . Smoking status: Former Games developer  . Smokeless tobacco: Not on file  . Alcohol Use: Yes     seldom    OB History    Grav Para Term Preterm Abortions TAB SAB Ect Mult Living                  Review of Systems  Constitutional: Negative.   HENT: Negative.   Eyes: Negative.   Respiratory: Negative.   Cardiovascular: Negative.   Gastrointestinal: Negative.   Genitourinary: Negative.   Musculoskeletal: Positive for joint swelling.  Skin: Negative.   Neurological: Negative.   Hematological: Negative.   Psychiatric/Behavioral: Negative.   All other systems reviewed and are negative.    Allergies  Nickel  Home Medications   Current Outpatient Rx  Name Route Sig Dispense Refill  . VITAMIN D3 1000 UNITS PO CAPS Oral Take 2 capsules by mouth daily.      Marland Kitchen CITALOPRAM HYDROBROMIDE 40 MG PO TABS Oral Take 40 mg by mouth  daily.      Marland Kitchen FEXOFENADINE HCL 180 MG PO TABS Oral Take 180 mg by mouth daily.      Marland Kitchen FLUTICASONE PROPIONATE 50 MCG/ACT NA SUSP Nasal Place 2 sprays into the nose daily.      Marland Kitchen HYDROCHLOROTHIAZIDE 25 MG PO TABS Oral Take 25 mg by mouth daily.      Carma Leaven M PLUS PO TABS Oral Take 1 tablet by mouth daily.      Marland Kitchen OMEPRAZOLE 40 MG PO CPDR Oral Take 40 mg by mouth daily.      . SULINDAC 200 MG PO TABS Oral Take 200 mg by mouth 2 (two) times daily with a meal.        BP 133/62  Pulse 60  Temp(Src) 98 F (36.7 C) (Oral)  Resp 18  SpO2 94%  Physical Exam  Constitutional: She is oriented to person, place, and time. Vital signs are normal. She appears well-developed and well-nourished.  HENT:  Head: Normocephalic and atraumatic.  Eyes: Conjunctivae are normal. Pupils are equal, round, and reactive to light.  Neck: Trachea normal, normal range of motion and full passive range of motion without pain. Neck supple.  Cardiovascular: Normal rate, regular rhythm, normal heart sounds and normal pulses.   Pulmonary/Chest: Effort normal and breath sounds normal. Chest wall is not dull to percussion. She  exhibits no tenderness, no crepitus, no edema, no deformity and no retraction.  Abdominal: Soft. Normal appearance and bowel sounds are normal.  Musculoskeletal: Normal range of motion.       Arms: Lymphadenopathy:       Head (right side): No submental, no submandibular, no tonsillar, no preauricular, no posterior auricular and no occipital adenopathy present.       Head (left side): No submental, no submandibular, no tonsillar, no preauricular, no posterior auricular and no occipital adenopathy present.    She has no cervical adenopathy.    She has no axillary adenopathy.  Neurological: She is alert and oriented to person, place, and time. She has normal strength.  Skin: Skin is warm, dry and intact.  Psychiatric: She has a normal mood and affect. Her speech is normal and behavior is normal. Judgment  and thought content normal. Cognition and memory are normal.    ED Course  Procedures (including critical care time)  Labs Reviewed - No data to display No results found.   No diagnosis found.  Dg Elbow Complete Right  04/23/2011  *RADIOLOGY REPORT*  Clinical Data: Fall, pop, pain.  RIGHT ELBOW - COMPLETE 3+ VIEW  Comparison: None.  Findings: Early arthritic changes in the right elbow. No acute bony abnormality.  Specifically, no fracture, subluxation, or dislocation.  Soft tissues are intact.  No joint effusion.  IMPRESSION: No acute bony abnormality.  Original Report Authenticated By: Cyndie Chime, M.D.     MDM  No fracture noted to the right elbow. Pt can follow-up with an Orthopedist if she continue to have pain symptoms.       Dorthula Matas, PA 04/29/11 [phone]

## 2011-04-23 NOTE — ED Notes (Signed)
Pt states she injured her right arm yesterday while lifting a table. States she heard "a popping and a crunching sound." Pt has pain that increases upon movement.

## 2011-04-29 NOTE — ED Provider Notes (Signed)
Medical screening examination/treatment/procedure(s) were performed by non-physician practitioner and as supervising physician I was immediately available for consultation/collaboration.  Cyndra Numbers, MD 04/29/11 [phone]

## 2011-05-01 ENCOUNTER — Other Ambulatory Visit: Payer: Self-pay | Admitting: Obstetrics and Gynecology

## 2011-05-01 DIAGNOSIS — C50919 Malignant neoplasm of unspecified site of unspecified female breast: Secondary | ICD-10-CM

## 2011-05-01 DIAGNOSIS — Z9889 Other specified postprocedural states: Secondary | ICD-10-CM

## 2011-05-02 ENCOUNTER — Other Ambulatory Visit: Payer: Self-pay | Admitting: Orthopedic Surgery

## 2011-05-02 DIAGNOSIS — M25521 Pain in right elbow: Secondary | ICD-10-CM

## 2011-05-05 ENCOUNTER — Ambulatory Visit
Admission: RE | Admit: 2011-05-05 | Discharge: 2011-05-05 | Disposition: A | Discharge status: Home / Self Care | Payer: 59 | Source: Ambulatory Visit | Attending: Orthopedic Surgery | Admitting: Orthopedic Surgery

## 2011-05-05 DIAGNOSIS — M25521 Pain in right elbow: Secondary | ICD-10-CM

## 2011-05-08 ENCOUNTER — Ambulatory Visit
Admission: RE | Admit: 2011-05-08 | Discharge: 2011-05-08 | Disposition: A | Discharge status: Home / Self Care | Payer: 59 | Source: Ambulatory Visit | Attending: Obstetrics and Gynecology | Admitting: Obstetrics and Gynecology

## 2011-05-08 DIAGNOSIS — C50919 Malignant neoplasm of unspecified site of unspecified female breast: Secondary | ICD-10-CM

## 2011-05-08 DIAGNOSIS — Z9889 Other specified postprocedural states: Secondary | ICD-10-CM

## 2012-01-01 ENCOUNTER — Other Ambulatory Visit: Payer: Self-pay | Admitting: Orthopedic Surgery

## 2012-01-01 DIAGNOSIS — M25561 Pain in right knee: Secondary | ICD-10-CM

## 2012-01-07 ENCOUNTER — Ambulatory Visit
Admission: RE | Admit: 2012-01-07 | Discharge: 2012-01-07 | Disposition: A | Discharge status: Home / Self Care | Payer: 59 | Source: Ambulatory Visit | Attending: Orthopedic Surgery | Admitting: Orthopedic Surgery

## 2012-01-07 DIAGNOSIS — M25561 Pain in right knee: Secondary | ICD-10-CM

## 2012-04-03 ENCOUNTER — Other Ambulatory Visit: Payer: Self-pay | Admitting: Obstetrics and Gynecology

## 2012-04-03 DIAGNOSIS — Z1231 Encounter for screening mammogram for malignant neoplasm of breast: Secondary | ICD-10-CM

## 2012-04-03 DIAGNOSIS — Z9889 Other specified postprocedural states: Secondary | ICD-10-CM

## 2012-04-03 DIAGNOSIS — Z853 Personal history of malignant neoplasm of breast: Secondary | ICD-10-CM

## 2012-05-09 ENCOUNTER — Ambulatory Visit
Admission: RE | Admit: 2012-05-09 | Discharge: 2012-05-09 | Disposition: A | Discharge status: Home / Self Care | Payer: Medicare Other | Source: Ambulatory Visit | Attending: Obstetrics and Gynecology | Admitting: Obstetrics and Gynecology

## 2012-05-09 DIAGNOSIS — Z9889 Other specified postprocedural states: Secondary | ICD-10-CM

## 2012-05-09 DIAGNOSIS — Z853 Personal history of malignant neoplasm of breast: Secondary | ICD-10-CM

## 2012-05-09 DIAGNOSIS — Z1231 Encounter for screening mammogram for malignant neoplasm of breast: Secondary | ICD-10-CM

## 2012-07-05 ENCOUNTER — Encounter (HOSPITAL_COMMUNITY): Payer: Self-pay | Admitting: Pharmacy Technician

## 2012-07-09 ENCOUNTER — Other Ambulatory Visit: Payer: Self-pay | Admitting: Physician Assistant

## 2012-07-10 NOTE — Pre-Procedure Instructions (Signed)
[name]  07/10/2012   Your procedure is scheduled on:  Friday, January 31st.  Report to [facility] at 5:30AM.  Call this number if you have problems the morning of surgery: [phone]   Remember:   Do not eat food or drink liquids after midnight.    Take these medicines the morning of surgery with A SIP OF WATER: Citalopram (Celexa), Ranitidine (Zantac) and Fexofenadine (Allegra).  Use Nasal Spray.   Do not wear jewelry, make-up or nail polish.  Do not wear lotions, powders, or perfumes. You may wear deodorant.  Do not shave 48 hours prior to surgery.   Do not bring valuables to the hospital.  Contacts, dentures or bridgework may not be worn into surgery.  Leave suitcase in the car. After surgery it may be brought to your room.  For patients admitted to the hospital, checkout time is 11:00 AM the day of  discharge.   Patients discharged the day of surgery will not be allowed to drive  home.  Name and phone number of your driver: NA   Special Instructions: Shower using CHG 2 nights before surgery and the night before surgery.  If you shower the day of surgery use CHG.  Use special wash - you have one bottle of CHG for all showers.  You should use approximately 1/3 of the bottle for each shower.   Please read over the following fact sheets that you were given: Pain Booklet, Coughing and Deep Breathing, Blood Transfusion Information and Surgical Site Infection Prevention

## 2012-07-11 ENCOUNTER — Encounter (HOSPITAL_COMMUNITY)
Admission: RE | Admit: 2012-07-11 | Discharge: 2012-07-11 | Disposition: A | Discharge status: Home / Self Care | Payer: Medicare Other | Source: Ambulatory Visit | Attending: Orthopedic Surgery | Admitting: Orthopedic Surgery

## 2012-07-11 ENCOUNTER — Other Ambulatory Visit: Payer: Self-pay

## 2012-07-11 ENCOUNTER — Encounter (HOSPITAL_COMMUNITY): Payer: Self-pay

## 2012-07-11 HISTORY — DX: Other specified behavioral and emotional disorders with onset usually occurring in childhood and adolescence: F98.8

## 2012-07-11 HISTORY — DX: Effusion, left ankle: M25.472

## 2012-07-11 HISTORY — DX: Unspecified osteoarthritis, unspecified site: M19.90

## 2012-07-11 HISTORY — DX: Gastro-esophageal reflux disease without esophagitis: K21.9

## 2012-07-11 HISTORY — DX: Effusion, left ankle: M25.471

## 2012-07-11 HISTORY — DX: Sleep apnea, unspecified: G47.30

## 2012-07-11 HISTORY — DX: Anxiety disorder, unspecified: F41.9

## 2012-07-11 LAB — CBC WITH DIFFERENTIAL/PLATELET
Basophils Absolute: 0.1 10*3/uL (ref 0.0–0.1)
Basophils Relative: 1 % (ref 0–1)
Eosinophils Absolute: 0.2 10*3/uL (ref 0.0–0.7)
Eosinophils Relative: 3 % (ref 0–5)
Lymphocytes Relative: 28 % (ref 12–46)
MCH: 31.5 pg (ref 26.0–34.0)
MCHC: 33.8 g/dL (ref 30.0–36.0)
MCV: 93.3 fL (ref 78.0–100.0)
Platelets: 283 10*3/uL (ref 150–400)
RDW: 13 % (ref 11.5–15.5)
WBC: 7.5 10*3/uL (ref 4.0–10.5)

## 2012-07-11 LAB — URINALYSIS, ROUTINE W REFLEX MICROSCOPIC
Hgb urine dipstick: NEGATIVE
Ketones, ur: NEGATIVE mg/dL
Leukocytes, UA: NEGATIVE
Protein, ur: NEGATIVE mg/dL
Urobilinogen, UA: 1 mg/dL (ref 0.0–1.0)

## 2012-07-11 LAB — COMPREHENSIVE METABOLIC PANEL
ALT: 40 U/L — ABNORMAL HIGH (ref 0–35)
AST: 37 U/L (ref 0–37)
Albumin: 4 g/dL (ref 3.5–5.2)
Calcium: 9.9 mg/dL (ref 8.4–10.5)
Sodium: 140 mEq/L (ref 135–145)
Total Protein: 7.9 g/dL (ref 6.0–8.3)

## 2012-07-11 LAB — PROTIME-INR: INR: 0.93 (ref 0.00–1.49)

## 2012-07-11 LAB — ABO/RH: ABO/RH(D): O NEG

## 2012-07-11 LAB — SURGICAL PCR SCREEN: Staphylococcus aureus: NEGATIVE

## 2012-07-11 LAB — TYPE AND SCREEN
ABO/RH(D): O NEG
Antibody Screen: NEGATIVE

## 2012-07-12 LAB — URINE CULTURE: Culture: NO GROWTH

## 2012-07-18 MED ORDER — BUPIVACAINE-EPINEPHRINE PF 0.25-1:200000 % IJ SOLN
INTRAMUSCULAR | Status: AC
  Filled 2012-07-18: qty 30

## 2012-07-18 MED ORDER — DEXTROSE 5 % IV SOLN
3.0000 g | INTRAVENOUS | Status: AC
  Administered 2012-07-19: 3 g via INTRAVENOUS
  Filled 2012-07-18: qty 3000

## 2012-07-18 MED ORDER — SODIUM CHLORIDE 0.9 % IV SOLN: INTRAVENOUS | Status: DC

## 2012-07-19 ENCOUNTER — Encounter (HOSPITAL_COMMUNITY)
Admission: RE | Disposition: A | Discharge status: Skilled Nursing Facility | Payer: Self-pay | Source: Ambulatory Visit | Attending: Orthopedic Surgery

## 2012-07-19 ENCOUNTER — Encounter (HOSPITAL_COMMUNITY): Payer: Self-pay | Admitting: *Deleted

## 2012-07-19 ENCOUNTER — Inpatient Hospital Stay (HOSPITAL_COMMUNITY)
Admission: RE | Admit: 2012-07-19 | Discharge: 2012-07-22 | DRG: 470 | Disposition: A | Discharge status: Skilled Nursing Facility | Payer: Medicare Other | Source: Ambulatory Visit | Attending: Orthopedic Surgery | Admitting: Orthopedic Surgery

## 2012-07-19 ENCOUNTER — Encounter (HOSPITAL_COMMUNITY): Payer: Self-pay | Admitting: Anesthesiology

## 2012-07-19 ENCOUNTER — Ambulatory Visit (HOSPITAL_COMMUNITY): Payer: Medicare Other | Admitting: Anesthesiology

## 2012-07-19 DIAGNOSIS — F988 Other specified behavioral and emotional disorders with onset usually occurring in childhood and adolescence: Secondary | ICD-10-CM | POA: Diagnosis present

## 2012-07-19 DIAGNOSIS — K5900 Constipation, unspecified: Secondary | ICD-10-CM | POA: Diagnosis present

## 2012-07-19 DIAGNOSIS — Z79899 Other long term (current) drug therapy: Secondary | ICD-10-CM

## 2012-07-19 DIAGNOSIS — Z87891 Personal history of nicotine dependence: Secondary | ICD-10-CM

## 2012-07-19 DIAGNOSIS — K219 Gastro-esophageal reflux disease without esophagitis: Secondary | ICD-10-CM | POA: Diagnosis present

## 2012-07-19 DIAGNOSIS — M1710 Unilateral primary osteoarthritis, unspecified knee: Principal | ICD-10-CM | POA: Diagnosis present

## 2012-07-19 DIAGNOSIS — Z01812 Encounter for preprocedural laboratory examination: Secondary | ICD-10-CM

## 2012-07-19 DIAGNOSIS — D62 Acute posthemorrhagic anemia: Secondary | ICD-10-CM | POA: Diagnosis not present

## 2012-07-19 DIAGNOSIS — G4730 Sleep apnea, unspecified: Secondary | ICD-10-CM | POA: Diagnosis present

## 2012-07-19 DIAGNOSIS — F411 Generalized anxiety disorder: Secondary | ICD-10-CM | POA: Diagnosis present

## 2012-07-19 HISTORY — PX: TOTAL KNEE ARTHROPLASTY: SHX125

## 2012-07-19 LAB — CBC
HCT: 36 % (ref 36.0–46.0)
Hemoglobin: 11.9 g/dL — ABNORMAL LOW (ref 12.0–15.0)
MCH: 30.7 pg (ref 26.0–34.0)
MCHC: 33.1 g/dL (ref 30.0–36.0)
RDW: 12.8 % (ref 11.5–15.5)

## 2012-07-19 LAB — CREATININE, SERUM: GFR calc non Af Amer: 71 mL/min — ABNORMAL LOW (ref >90)

## 2012-07-19 SURGERY — ARTHROPLASTY, KNEE, TOTAL
Anesthesia: General | Site: Knee | Laterality: Right | Wound class: Clean

## 2012-07-19 MED ORDER — HYDROMORPHONE HCL PF 1 MG/ML IJ SOLN
INTRAMUSCULAR | Status: AC
  Administered 2012-07-19: 0.5 mg
  Filled 2012-07-19: qty 1

## 2012-07-19 MED ORDER — ACETAMINOPHEN 10 MG/ML IV SOLN
INTRAVENOUS | Status: AC
Start: 2012-07-19 — End: 2012-07-19
  Filled 2012-07-19: qty 100

## 2012-07-19 MED ORDER — BISACODYL 10 MG RE SUPP: 10.0000 mg | Freq: Every day | RECTAL | Status: DC | PRN

## 2012-07-19 MED ORDER — OMEGA-3-ACID ETHYL ESTERS 1 G PO CAPS
1.0000 g | ORAL_CAPSULE | Freq: Every day | ORAL | Status: DC
  Administered 2012-07-19 – 2012-07-22 (×4): 1 g via ORAL
  Filled 2012-07-19 (×4): qty 1

## 2012-07-19 MED ORDER — HYDROMORPHONE HCL PF 1 MG/ML IJ SOLN
1.0000 mg | INTRAMUSCULAR | Status: DC | PRN
  Administered 2012-07-19 – 2012-07-20 (×3): 1 mg via INTRAVENOUS
  Filled 2012-07-19 (×3): qty 1

## 2012-07-19 MED ORDER — METOCLOPRAMIDE HCL 5 MG/ML IJ SOLN: 5.0000 mg | Freq: Three times a day (TID) | INTRAMUSCULAR | Status: DC | PRN

## 2012-07-19 MED ORDER — FAMOTIDINE 20 MG PO TABS
20.0000 mg | ORAL_TABLET | Freq: Every day | ORAL | Status: DC
  Administered 2012-07-19 – 2012-07-22 (×4): 20 mg via ORAL
  Filled 2012-07-19 (×4): qty 1

## 2012-07-19 MED ORDER — ONDANSETRON HCL 4 MG/2ML IJ SOLN: 4.0000 mg | Freq: Once | INTRAMUSCULAR | Status: DC | PRN

## 2012-07-19 MED ORDER — METOCLOPRAMIDE HCL 5 MG PO TABS
5.0000 mg | ORAL_TABLET | Freq: Three times a day (TID) | ORAL | Status: DC | PRN
  Filled 2012-07-19: qty 2

## 2012-07-19 MED ORDER — LACTATED RINGERS IV SOLN
INTRAVENOUS | Status: DC | PRN
  Administered 2012-07-19 (×2): via INTRAVENOUS

## 2012-07-19 MED ORDER — HYDROCHLOROTHIAZIDE 25 MG PO TABS
25.0000 mg | ORAL_TABLET | Freq: Every day | ORAL | Status: DC
  Administered 2012-07-19 – 2012-07-22 (×4): 25 mg via ORAL
  Filled 2012-07-19 (×5): qty 1

## 2012-07-19 MED ORDER — METHOCARBAMOL 500 MG PO TABS
500.0000 mg | ORAL_TABLET | Freq: Four times a day (QID) | ORAL | Status: DC | PRN
  Administered 2012-07-19 – 2012-07-21 (×6): 500 mg via ORAL
  Filled 2012-07-19 (×7): qty 1

## 2012-07-19 MED ORDER — SODIUM CHLORIDE 0.9 % IR SOLN
Status: DC | PRN
  Administered 2012-07-19: 3000 mL

## 2012-07-19 MED ORDER — SIMVASTATIN 20 MG PO TABS
20.0000 mg | ORAL_TABLET | Freq: Every day | ORAL | Status: DC
  Administered 2012-07-19 – 2012-07-21 (×3): 20 mg via ORAL
  Filled 2012-07-19 (×4): qty 1

## 2012-07-19 MED ORDER — ACETAMINOPHEN 10 MG/ML IV SOLN
1000.0000 mg | Freq: Four times a day (QID) | INTRAVENOUS | Status: DC
  Administered 2012-07-19: 1000 mg via INTRAVENOUS
  Filled 2012-07-19 (×3): qty 100

## 2012-07-19 MED ORDER — CHLORHEXIDINE GLUCONATE 4 % EX LIQD: 60.0000 mL | Freq: Once | CUTANEOUS | Status: DC

## 2012-07-19 MED ORDER — ONDANSETRON HCL 4 MG PO TABS: 4.0000 mg | ORAL_TABLET | Freq: Four times a day (QID) | ORAL | Status: DC | PRN

## 2012-07-19 MED ORDER — DEXAMETHASONE SODIUM PHOSPHATE 4 MG/ML IJ SOLN
INTRAMUSCULAR | Status: DC | PRN
  Administered 2012-07-19: 4 mg via INTRAVENOUS

## 2012-07-19 MED ORDER — DOCUSATE SODIUM 100 MG PO CAPS
100.0000 mg | ORAL_CAPSULE | Freq: Two times a day (BID) | ORAL | Status: DC
  Administered 2012-07-19 – 2012-07-22 (×7): 100 mg via ORAL
  Filled 2012-07-19 (×8): qty 1

## 2012-07-19 MED ORDER — GLYCOPYRROLATE 0.2 MG/ML IJ SOLN
INTRAMUSCULAR | Status: DC | PRN
  Administered 2012-07-19: .4 mg via INTRAVENOUS

## 2012-07-19 MED ORDER — HYDROMORPHONE HCL PF 1 MG/ML IJ SOLN
INTRAMUSCULAR | Status: AC
  Filled 2012-07-19: qty 1

## 2012-07-19 MED ORDER — ACETAMINOPHEN 325 MG PO TABS
650.0000 mg | ORAL_TABLET | Freq: Four times a day (QID) | ORAL | Status: DC | PRN
  Administered 2012-07-20: 650 mg via ORAL
  Filled 2012-07-19: qty 2

## 2012-07-19 MED ORDER — ONDANSETRON HCL 4 MG/2ML IJ SOLN: 4.0000 mg | Freq: Four times a day (QID) | INTRAMUSCULAR | Status: DC | PRN

## 2012-07-19 MED ORDER — ACETAMINOPHEN 650 MG RE SUPP: 650.0000 mg | Freq: Four times a day (QID) | RECTAL | Status: DC | PRN

## 2012-07-19 MED ORDER — ENOXAPARIN SODIUM 30 MG/0.3ML ~~LOC~~ SOLN
30.0000 mg | Freq: Two times a day (BID) | SUBCUTANEOUS | Status: DC
  Administered 2012-07-20 – 2012-07-22 (×5): 30 mg via SUBCUTANEOUS
  Filled 2012-07-19 (×7): qty 0.3

## 2012-07-19 MED ORDER — SODIUM CHLORIDE 0.9 % IV SOLN
INTRAVENOUS | Status: DC
  Administered 2012-07-19: 20:00:00 via INTRAVENOUS

## 2012-07-19 MED ORDER — MENTHOL 3 MG MT LOZG: 1.0000 | LOZENGE | OROMUCOSAL | Status: DC | PRN

## 2012-07-19 MED ORDER — OMEGA-3 FATTY ACIDS 1000 MG PO CAPS: 2.0000 g | ORAL_CAPSULE | Freq: Every day | ORAL | Status: DC

## 2012-07-19 MED ORDER — 0.9 % SODIUM CHLORIDE (POUR BTL) OPTIME
TOPICAL | Status: DC | PRN
  Administered 2012-07-19: 1000 mL

## 2012-07-19 MED ORDER — CITALOPRAM HYDROBROMIDE 40 MG PO TABS
40.0000 mg | ORAL_TABLET | Freq: Every day | ORAL | Status: DC
  Administered 2012-07-20 – 2012-07-22 (×3): 40 mg via ORAL
  Filled 2012-07-19 (×3): qty 1

## 2012-07-19 MED ORDER — CEFAZOLIN SODIUM-DEXTROSE 2-3 GM-% IV SOLR
2.0000 g | Freq: Four times a day (QID) | INTRAVENOUS | Status: AC
  Administered 2012-07-19 (×2): 2 g via INTRAVENOUS
  Filled 2012-07-19 (×2): qty 50

## 2012-07-19 MED ORDER — FENTANYL CITRATE 0.05 MG/ML IJ SOLN
INTRAMUSCULAR | Status: DC | PRN
  Administered 2012-07-19: 100 ug via INTRAVENOUS
  Administered 2012-07-19: 150 ug via INTRAVENOUS
  Administered 2012-07-19: 100 ug via INTRAVENOUS

## 2012-07-19 MED ORDER — NEOSTIGMINE METHYLSULFATE 1 MG/ML IJ SOLN
INTRAMUSCULAR | Status: DC | PRN
  Administered 2012-07-19: 3 mg via INTRAVENOUS

## 2012-07-19 MED ORDER — DIPHENHYDRAMINE HCL 12.5 MG/5ML PO ELIX: 12.5000 mg | ORAL_SOLUTION | ORAL | Status: DC | PRN

## 2012-07-19 MED ORDER — ACETAMINOPHEN 10 MG/ML IV SOLN
1000.0000 mg | Freq: Four times a day (QID) | INTRAVENOUS | Status: AC
  Administered 2012-07-19 – 2012-07-20 (×3): 1000 mg via INTRAVENOUS
  Filled 2012-07-19 (×4): qty 100

## 2012-07-19 MED ORDER — METHOCARBAMOL 100 MG/ML IJ SOLN
500.0000 mg | Freq: Four times a day (QID) | INTRAVENOUS | Status: DC | PRN
  Administered 2012-07-19: 500 mg via INTRAVENOUS
  Filled 2012-07-19: qty 5

## 2012-07-19 MED ORDER — SENNOSIDES-DOCUSATE SODIUM 8.6-50 MG PO TABS
1.0000 | ORAL_TABLET | Freq: Every evening | ORAL | Status: DC | PRN
  Administered 2012-07-21: 1 via ORAL
  Filled 2012-07-19: qty 1

## 2012-07-19 MED ORDER — FLEET ENEMA 7-19 GM/118ML RE ENEM: 1.0000 | ENEMA | Freq: Once | RECTAL | Status: AC | PRN

## 2012-07-19 MED ORDER — HYDROMORPHONE HCL PF 1 MG/ML IJ SOLN
0.2500 mg | INTRAMUSCULAR | Status: DC | PRN
  Administered 2012-07-19: 0.5 mg via INTRAVENOUS

## 2012-07-19 MED ORDER — SUCCINYLCHOLINE CHLORIDE 20 MG/ML IJ SOLN
INTRAMUSCULAR | Status: DC | PRN
  Administered 2012-07-19: 140 mg via INTRAVENOUS

## 2012-07-19 MED ORDER — ONDANSETRON HCL 4 MG/2ML IJ SOLN
INTRAMUSCULAR | Status: DC | PRN
  Administered 2012-07-19: 4 mg via INTRAVENOUS

## 2012-07-19 MED ORDER — ROCURONIUM BROMIDE 100 MG/10ML IV SOLN
INTRAVENOUS | Status: DC | PRN
  Administered 2012-07-19: 50 mg via INTRAVENOUS
  Administered 2012-07-19: 15 mg via INTRAVENOUS

## 2012-07-19 MED ORDER — FLUTICASONE PROPIONATE 50 MCG/ACT NA SUSP
2.0000 | Freq: Every day | NASAL | Status: DC | PRN
  Administered 2012-07-20 – 2012-07-21 (×2): 2 via NASAL
  Filled 2012-07-19: qty 16

## 2012-07-19 MED ORDER — PROPOFOL 10 MG/ML IV BOLUS
INTRAVENOUS | Status: DC | PRN
  Administered 2012-07-19: 200 mg via INTRAVENOUS

## 2012-07-19 MED ORDER — LORATADINE 10 MG PO TABS
10.0000 mg | ORAL_TABLET | Freq: Every day | ORAL | Status: DC | PRN
  Administered 2012-07-20 – 2012-07-21 (×2): 10 mg via ORAL
  Filled 2012-07-19 (×3): qty 1

## 2012-07-19 MED ORDER — LIDOCAINE HCL 4 % MT SOLN
OROMUCOSAL | Status: DC | PRN
  Administered 2012-07-19: 4 mL via TOPICAL

## 2012-07-19 MED ORDER — VITAMIN D3 25 MCG (1000 UT) PO CAPS: 3.0000 | ORAL_CAPSULE | Freq: Every day | ORAL | Status: DC

## 2012-07-19 MED ORDER — PHENOL 1.4 % MT LIQD: 1.0000 | OROMUCOSAL | Status: DC | PRN

## 2012-07-19 MED ORDER — LIDOCAINE HCL (CARDIAC) 20 MG/ML IV SOLN
INTRAVENOUS | Status: DC | PRN
  Administered 2012-07-19: 100 mg via INTRAVENOUS

## 2012-07-19 MED ORDER — OXYCODONE HCL 5 MG PO TABS
5.0000 mg | ORAL_TABLET | ORAL | Status: DC | PRN
  Administered 2012-07-19 – 2012-07-21 (×12): 10 mg via ORAL
  Administered 2012-07-22: 5 mg via ORAL
  Administered 2012-07-22: 10 mg via ORAL
  Filled 2012-07-19 (×8): qty 2
  Filled 2012-07-19: qty 1
  Filled 2012-07-19 (×5): qty 2

## 2012-07-19 MED ORDER — MIDAZOLAM HCL 5 MG/5ML IJ SOLN
INTRAMUSCULAR | Status: DC | PRN
  Administered 2012-07-19: 2 mg via INTRAVENOUS

## 2012-07-19 MED ORDER — VITAMIN D3 25 MCG (1000 UNIT) PO TABS
3000.0000 [IU] | ORAL_TABLET | Freq: Every day | ORAL | Status: DC
  Administered 2012-07-19 – 2012-07-22 (×4): 3000 [IU] via ORAL
  Filled 2012-07-19 (×4): qty 3

## 2012-07-19 SURGICAL SUPPLY — 66 items
45MM RIMMED SPEED PIN ×1 IMPLANT
BANDAGE ELASTIC 4 VELCRO ST LF (GAUZE/BANDAGES/DRESSINGS) ×2 IMPLANT
BANDAGE ELASTIC 6 VELCRO ST LF (GAUZE/BANDAGES/DRESSINGS) ×2 IMPLANT
BANDAGE ESMARK 6X9 LF (GAUZE/BANDAGES/DRESSINGS) ×1 IMPLANT
BLADE SAGITTAL 25.0X1.19X90 (BLADE) ×2 IMPLANT
BLADE SAW SAG 90X13X1.27 (BLADE) ×2 IMPLANT
BLADE SURG 10 STRL SS (BLADE) ×1 IMPLANT
BNDG CMPR 9X6 STRL LF SNTH (GAUZE/BANDAGES/DRESSINGS) ×1
BNDG ESMARK 6X9 LF (GAUZE/BANDAGES/DRESSINGS) ×2
BOWL SMART MIX CTS (DISPOSABLE) ×2 IMPLANT
CEMENT HV SMART SET (Cement) ×2 IMPLANT
CLOTH BEACON ORANGE TIMEOUT ST (SAFETY) ×2 IMPLANT
COVER BACK TABLE 24X17X13 BIG (DRAPES) IMPLANT
COVER SURGICAL LIGHT HANDLE (MISCELLANEOUS) ×2 IMPLANT
CUFF TOURNIQUET SINGLE 34IN LL (TOURNIQUET CUFF) ×2 IMPLANT
CUFF TOURNIQUET SINGLE 44IN (TOURNIQUET CUFF) IMPLANT
DRAPE INCISE IOBAN 66X45 STRL (DRAPES) IMPLANT
DRAPE ORTHO SPLIT 77X108 STRL (DRAPES) ×4
DRAPE SURG ORHT 6 SPLT 77X108 (DRAPES) ×2 IMPLANT
DRAPE U-SHAPE 47X51 STRL (DRAPES) ×2 IMPLANT
DRSG ADAPTIC 3X8 NADH LF (GAUZE/BANDAGES/DRESSINGS) ×2 IMPLANT
DRSG PAD ABDOMINAL 8X10 ST (GAUZE/BANDAGES/DRESSINGS) ×3 IMPLANT
DURAPREP 26ML APPLICATOR (WOUND CARE) ×2 IMPLANT
ELECT REM PT RETURN 9FT ADLT (ELECTROSURGICAL) ×2
ELECTRODE REM PT RTRN 9FT ADLT (ELECTROSURGICAL) ×1 IMPLANT
EVACUATOR 1/8 PVC DRAIN (DRAIN) ×2 IMPLANT
FACESHIELD LNG OPTICON STERILE (SAFETY) ×4 IMPLANT
FLOSEAL 10ML (HEMOSTASIS) IMPLANT
GLOVE BIOGEL PI IND STRL 8 (GLOVE) ×4 IMPLANT
GLOVE BIOGEL PI INDICATOR 8 (GLOVE) ×4
GLOVE ORTHO TXT STRL SZ7.5 (GLOVE) ×6 IMPLANT
GLOVE SURG ORTHO 8.0 STRL STRW (GLOVE) ×6 IMPLANT
GOWN PREVENTION PLUS XLARGE (GOWN DISPOSABLE) ×2 IMPLANT
GOWN PREVENTION PLUS XXLARGE (GOWN DISPOSABLE) ×2 IMPLANT
GOWN STRL NON-REIN LRG LVL3 (GOWN DISPOSABLE) ×4 IMPLANT
HANDPIECE INTERPULSE COAX TIP (DISPOSABLE) ×2
HOOD PEEL AWAY FACE SHEILD DIS (HOOD) ×2 IMPLANT
IMMOBILIZER KNEE 22 UNIV (SOFTGOODS) ×1 IMPLANT
KIT BASIN OR (CUSTOM PROCEDURE TRAY) ×2 IMPLANT
KIT ROOM TURNOVER OR (KITS) ×2 IMPLANT
MANIFOLD NEPTUNE II (INSTRUMENTS) ×2 IMPLANT
NEEDLE 22X1 1/2 (OR ONLY) (NEEDLE) IMPLANT
NS IRRIG 1000ML POUR BTL (IV SOLUTION) ×2 IMPLANT
PACK TOTAL JOINT (CUSTOM PROCEDURE TRAY) ×2 IMPLANT
PACK UNIVERSAL I (CUSTOM PROCEDURE TRAY) ×1 IMPLANT
PAD ARMBOARD 7.5X6 YLW CONV (MISCELLANEOUS) ×4 IMPLANT
PAD CAST 4YDX4 CTTN HI CHSV (CAST SUPPLIES) ×1 IMPLANT
PADDING CAST COTTON 4X4 STRL (CAST SUPPLIES) ×2
PADDING CAST COTTON 6X4 STRL (CAST SUPPLIES) ×2 IMPLANT
PADDING CAST SYNTHETIC 4 (CAST SUPPLIES) ×1
PADDING CAST SYNTHETIC 4X4 STR (CAST SUPPLIES) IMPLANT
SET HNDPC FAN SPRY TIP SCT (DISPOSABLE) ×1 IMPLANT
SPONGE GAUZE 4X4 12PLY (GAUZE/BANDAGES/DRESSINGS) ×2 IMPLANT
STAPLER VISISTAT 35W (STAPLE) ×2 IMPLANT
SUCTION FRAZIER TIP 10 FR DISP (SUCTIONS) ×2 IMPLANT
SUT ETHIBOND NAB CT1 #1 30IN (SUTURE) ×6 IMPLANT
SUT ETHILON 3 0 PS 1 (SUTURE) ×2 IMPLANT
SUT VIC AB 0 CT1 27 (SUTURE) ×2
SUT VIC AB 0 CT1 27XBRD ANBCTR (SUTURE) ×1 IMPLANT
SUT VIC AB 2-0 CT1 27 (SUTURE) ×4
SUT VIC AB 2-0 CT1 TAPERPNT 27 (SUTURE) ×2 IMPLANT
SYR CONTROL 10ML LL (SYRINGE) IMPLANT
TOWEL OR 17X24 6PK STRL BLUE (TOWEL DISPOSABLE) ×2 IMPLANT
TOWEL OR 17X26 10 PK STRL BLUE (TOWEL DISPOSABLE) ×2 IMPLANT
TRAY FOLEY CATH 14FR (SET/KITS/TRAYS/PACK) ×2 IMPLANT
WATER STERILE IRR 1000ML POUR (IV SOLUTION) ×6 IMPLANT

## 2012-07-19 NOTE — Brief Op Note (Signed)
07/19/2012  11:11 AM  PATIENT:  [name]  68 y.o. female  PRE-OPERATIVE DIAGNOSIS:  OA RIGHT KNEE  POST-OPERATIVE DIAGNOSIS:  OA RIGHT KNEE  PROCEDURE:  Procedure(s) (LRB) with comments: TOTAL KNEE ARTHROPLASTY (Right) - right total knee arthroplasty  SURGEON:  Surgeon(s) and Role:    * W D Carloyn Manner., MD - Primary  PHYSICIAN ASSISTANT:   ASSISTANTS: Margart Sickles, PA-C   ANESTHESIA:   general and femoral block  EBL:  Total I/O In: 1500 [I.V.:1500] Out: 330 [Urine:300; Blood:30]  BLOOD ADMINISTERED:none  DRAINS:hemovac right knee self suction  LOCAL MEDICATIONS USED:  NONE  SPECIMEN:  No Specimen  DISPOSITION OF SPECIMEN:  N/A  COUNTS:  YES  TOURNIQUET:   Total Tourniquet Time Documented: Thigh (Right) - 95 minutes  DICTATION: .Other Dictation: Dictation Number   PLAN OF CARE: Admit to inpatient   PATIENT DISPOSITION:  PACU - hemodynamically stable.   Delay start of Pharmacological VTE agent (>24hrs) due to surgical blood loss or risk of bleeding: yes

## 2012-07-19 NOTE — Anesthesia Postprocedure Evaluation (Signed)
Anesthesia Post Note  Patient: [name]  Procedure(s) Performed: Procedure(s) (LRB): TOTAL KNEE ARTHROPLASTY (Right)  Anesthesia type: general  Patient location: PACU  Post pain: Pain level controlled  Post assessment: Patient's Cardiovascular Status Stable  Last Vitals:  Filed Vitals:   07/19/12 1200  BP:   Pulse:   Temp: 36.4 C  Resp:     Post vital signs: Reviewed and stable  Level of consciousness: sedated  Complications: No apparent anesthesia complications

## 2012-07-19 NOTE — H&P (Signed)
TOTAL KNEE ADMISSION H&P  Patient is being admitted for right total knee arthroplasty.  Subjective:  Chief Complaint:right knee pain.  HPI: [name], 67 y.o. female, has a history of pain and functional disability in the right knee due to arthritis and has failed non-surgical conservative treatments for greater than 12 weeks to includeNSAID's and/or analgesics, corticosteriod injections, viscosupplementation injections and activity modification.  Onset of symptoms was gradual, starting 5 years ago with gradually worsening course since that time. The patient noted no past surgery on the right knee(s).  Patient currently rates pain in the right knee(s) as moderate to severe with activity. Patient has night pain, worsening of pain with activity and weight bearing, pain that interferes with activities of daily living, pain with passive range of motion, crepitus and joint swelling.  Patient has evidence of periarticular osteophytes and joint space narrowing by imaging studies. There is no active infection.  There are no active problems to display for this patient.  Past Medical History  Diagnosis Date  . Swelling of both ankles   . GERD (gastroesophageal reflux disease)     barretts  . ADD (attention deficit disorder)   . Anxiety   . Arthritis     os  . Sleep apnea     cpap   last sleep study> 3 yrs    Past Surgical History  Procedure Date  . Abdominal hysterectomy   . Breast lumpectomy   . Cholecystectomy   . Tubal ligation   . Bicept surgery      (Not in a hospital admission) Allergies  Allergen Reactions  . Nickel Other (See Comments)    UNKNOWN     History  Substance Use Topics  . Smoking status: Former Smoker    Quit date: 07/11/1997  . Smokeless tobacco: Not on file  . Alcohol Use: Yes     Comment: seldom    No family history on file.   Review of Systems  Constitutional: Negative.   HENT: Positive for nosebleeds. Negative for hearing loss, ear pain,  congestion, sore throat, tinnitus and ear discharge.   Eyes: Negative.   Respiratory: Negative.  Negative for stridor.   Cardiovascular: Positive for chest pain. Negative for palpitations, orthopnea, claudication and leg swelling.  Gastrointestinal: Positive for constipation. Negative for heartburn, nausea, vomiting, abdominal pain, diarrhea, blood in stool and melena.  Genitourinary: Positive for dysuria, frequency and hematuria. Negative for urgency and flank pain.  Musculoskeletal: Positive for joint pain. Negative for falls.  Skin: Positive for rash. Negative for itching.  Neurological: Positive for headaches. Negative for dizziness, tingling, tremors, sensory change, speech change, focal weakness, seizures and loss of consciousness.  Endo/Heme/Allergies: Negative.   Psychiatric/Behavioral: Negative for depression, suicidal ideas and substance abuse.    Objective:  Physical Exam  Constitutional: She is oriented to person, place, and time. She appears well-developed and well-nourished. No distress.  HENT:  Head: Normocephalic and atraumatic.  Nose: Nose normal.  Eyes: Conjunctivae normal and EOM are normal. Pupils are equal, round, and reactive to light.  Neck: Normal range of motion. Neck supple.  Cardiovascular: Normal rate, regular rhythm and normal heart sounds.   No murmur heard. Respiratory: Effort normal and breath sounds normal. No respiratory distress. She has no wheezes.  GI: Soft. Bowel sounds are normal. She exhibits no distension. There is no tenderness.  Musculoskeletal:       Right knee: tenderness found.       RLE NVI, intact DF and PF with the ankle,   no calf tenderness, ROM knee 0-110, positive patellofemoral crepitus, stable ligamentous testing  Lymphadenopathy:    She has no cervical adenopathy.  Neurological: She is alert and oriented to person, place, and time. No cranial nerve deficit.  Skin: Skin is warm and dry. No rash noted. No erythema.  Psychiatric: She  has a normal mood and affect. Her behavior is normal.    Vital signs in last 24 hours: @VSRANGES@  Labs:   Ht 5'4'' Wt 235 BMI 40.3  Imaging Review Plain radiographs demonstrate severe degenerative joint disease of the right knee(s). The overall alignment isneutral. The bone quality appears to be good for age and reported activity level.  Assessment/Plan:  End stage arthritis, right knee   The patient history, physical examination, clinical judgment of the provider and imaging studies are consistent with end stage degenerative joint disease of the right knee(s) and total knee arthroplasty is deemed medically necessary. The treatment options including medical management, injection therapy arthroscopy and arthroplasty were discussed at length. The risks and benefits of total knee arthroplasty were presented and reviewed. The risks due to aseptic loosening, infection, stiffness, patella tracking problems, thromboembolic complications and other imponderables were discussed. The patient acknowledged the explanation, agreed to proceed with the plan and consent was signed. Patient is being admitted for inpatient treatment for surgery, pain control, PT, OT, prophylactic antibiotics, VTE prophylaxis, progressive ambulation and ADL's and discharge planning. The patient is planning to be discharged to skilled nursing facility [facility]  Patient with nickel allergy will use [employer] system possible revision stem due to BMI, no surgical staples.   

## 2012-07-19 NOTE — Interval H&P Note (Signed)
History and Physical Interval Note:  07/19/2012 7:28 AM  [name]  has presented today for surgery, with the diagnosis of OA RIGHT KNEE  The various methods of treatment have been discussed with the patient and family. After consideration of risks, benefits and other options for treatment, the patient has consented to  Procedure(s) (LRB) with comments: TOTAL KNEE ARTHROPLASTY (Right) -  Left Knee arthroplasty knee medial and leteral compartments with patella resurfacing  as a surgical intervention .  The patient's history has been reviewed, patient examined, no change in status, stable for surgery.  I have reviewed the patient's chart and labs.  Questions were answered to the patient's satisfaction.     [name],W D

## 2012-07-19 NOTE — Transfer of Care (Signed)
Immediate Anesthesia Transfer of Care Note  Patient: [name]  Procedure(s) Performed: Procedure(s) (LRB) with comments: TOTAL KNEE ARTHROPLASTY (Right) - right total knee arthroplasty  Patient Location: PACU  Anesthesia Type:General  Level of Consciousness: awake, alert  and oriented  Airway & Oxygen Therapy: Patient Spontanous Breathing and Patient connected to face mask oxygen  Post-op Assessment: Report given to PACU RN and Post -op Vital signs reviewed and stable  Post vital signs: Reviewed and stable  Complications: No apparent anesthesia complications

## 2012-07-19 NOTE — Op Note (Signed)
NAME:  [name], [name] NO.:  [id number]  MEDICAL RECORD NO.:  [id number]  LOCATION:  [facility]                         FACILITY:  [facility]  PHYSICIAN:  Dyke Brackett, M.D.    DATE OF BIRTH:  1945-12-30  DATE OF PROCEDURE:  07/19/2012 DATE OF DISCHARGE:                              OPERATIVE REPORT   INDICATIONS:  A 67 year old female with metal allergy with an end-stage knee thought to be amenable to outpatient surgery.  PREOPERATIVE DIAGNOSIS:  Severe osteoarthritis, right knee.  POSTOPERATIVE DIAGNOSIS:  Severe osteoarthritis, right knee.  OPERATION:  Right total knee replacement ([employer] Oxinium posterior stabilized knee) size 5 femur, size 3 tibia, 13 mm tibial insert with 32 mm all poly patella one.  Also, open lateral release of the right knee is secondary procedure.  SURGEON:  Dyke Brackett, M.D.  ASSISTANT:  Margart Sickles, PA-C.  TOURNIQUET TIME:  90 minutes.  DESCRIPTION OF PROCEDURES:  After sterile prep and drape, exsanguination leg inflation to 350.  She had a straight skin incision, medial parapatellar approach __________ made, cut the distal femur cut 5 degree valgus and then dropped back 4 mm to get enough bone.  We then cut the tibia at about 9 mm below the most diseased medial compartment.  We then created extension gap eventually measuring 13 mm.  We then placed the external rotation guide to set the rotation of the anterior-posterior chamfer cuts with the 3-degree external rotation and pinned this and then eventually cut this for a size 5 femur.  We then sized the tibial trial with tibial base plates to be a size 3, pinned this in place. Again, the box cut was made for the femur to a combination of reaming and chisel cut and also the trial was then assembled using the appropriate attachment for the box.  Patella was cut leaving about 14 mm of native patella for 9 mm cut resurfacing the patella with a size 32, 9 mm  patella.  Attention was next directed back at the tibia of the base plate and holes were cut for the tibia.  Prior to this, we did a trial with 9, 11, and then 13 insert, 13 full extension was noted with good stability medially laterally.  We did have to perform arthroscopic open lateral release to improve patellar tracking.  The trials were all removed. Cement was attached, which was prepared on the back table __________ batches of cement.  Cement was inserted in the doughy state __________ the prosthesis tibia followed by femur patella with a trial on the tibia.  Cement was allowed to harden and excess cement was removed from the surfaces of all the prosthetic components.  Again, trial bearing was placed __________ 13 mm bearing was deemed to be acceptable.  The tourniquet was released.  No excessive bleeding was noted.  Prior to this, no excessive cement was noted in the posterior aspect of the knee. Then, the final bearing fixed bearing knee was placed and snapped into place, and all parameters acceptable.  Hemovac drain was placed superolaterally.  Closure was effected with #1 Ethibond, 2-0 Vicryl, and due to the patient even stating that she had  metal sensitivity to staples, interrupted 3-0 nylon on the skin. __________ compressive sterile dressing, knee immobilizer applied, taken to recovery room in stable condition.     Dyke Brackett, M.D.     WDC/MEDQ  D:  07/19/2012  T:  07/19/2012  Job:  [id number]

## 2012-07-19 NOTE — H&P (View-Only) (Signed)
TOTAL KNEE ADMISSION H&P  Patient is being admitted for right total knee arthroplasty.  Subjective:  Chief Complaint:right knee pain.  HPI: [name], 67 y.o. female, has a history of pain and functional disability in the right knee due to arthritis and has failed non-surgical conservative treatments for greater than 12 weeks to includeNSAID's and/or analgesics, corticosteriod injections, viscosupplementation injections and activity modification.  Onset of symptoms was gradual, starting 5 years ago with gradually worsening course since that time. The patient noted no past surgery on the right knee(s).  Patient currently rates pain in the right knee(s) as moderate to severe with activity. Patient has night pain, worsening of pain with activity and weight bearing, pain that interferes with activities of daily living, pain with passive range of motion, crepitus and joint swelling.  Patient has evidence of periarticular osteophytes and joint space narrowing by imaging studies. There is no active infection.  There are no active problems to display for this patient.  Past Medical History  Diagnosis Date  . Swelling of both ankles   . GERD (gastroesophageal reflux disease)     barretts  . ADD (attention deficit disorder)   . Anxiety   . Arthritis     os  . Sleep apnea     cpap   last sleep study> 3 yrs    Past Surgical History  Procedure Date  . Abdominal hysterectomy   . Breast lumpectomy   . Cholecystectomy   . Tubal ligation   . Bicept surgery      (Not in a hospital admission) Allergies  Allergen Reactions  . Nickel Other (See Comments)    UNKNOWN     History  Substance Use Topics  . Smoking status: Former Smoker    Quit date: 07/11/1997  . Smokeless tobacco: Not on file  . Alcohol Use: Yes     Comment: seldom    No family history on file.   Review of Systems  Constitutional: Negative.   HENT: Positive for nosebleeds. Negative for hearing loss, ear pain,  congestion, sore throat, tinnitus and ear discharge.   Eyes: Negative.   Respiratory: Negative.  Negative for stridor.   Cardiovascular: Positive for chest pain. Negative for palpitations, orthopnea, claudication and leg swelling.  Gastrointestinal: Positive for constipation. Negative for heartburn, nausea, vomiting, abdominal pain, diarrhea, blood in stool and melena.  Genitourinary: Positive for dysuria, frequency and hematuria. Negative for urgency and flank pain.  Musculoskeletal: Positive for joint pain. Negative for falls.  Skin: Positive for rash. Negative for itching.  Neurological: Positive for headaches. Negative for dizziness, tingling, tremors, sensory change, speech change, focal weakness, seizures and loss of consciousness.  Endo/Heme/Allergies: Negative.   Psychiatric/Behavioral: Negative for depression, suicidal ideas and substance abuse.    Objective:  Physical Exam  Constitutional: She is oriented to person, place, and time. She appears well-developed and well-nourished. No distress.  HENT:  Head: Normocephalic and atraumatic.  Nose: Nose normal.  Eyes: Conjunctivae normal and EOM are normal. Pupils are equal, round, and reactive to light.  Neck: Normal range of motion. Neck supple.  Cardiovascular: Normal rate, regular rhythm and normal heart sounds.   No murmur heard. Respiratory: Effort normal and breath sounds normal. No respiratory distress. She has no wheezes.  GI: Soft. Bowel sounds are normal. She exhibits no distension. There is no tenderness.  Musculoskeletal:       Right knee: tenderness found.       RLE NVI, intact DF and PF with the ankle,  no calf tenderness, ROM knee 0-110, positive patellofemoral crepitus, stable ligamentous testing  Lymphadenopathy:    She has no cervical adenopathy.  Neurological: She is alert and oriented to person, place, and time. No cranial nerve deficit.  Skin: Skin is warm and dry. No rash noted. No erythema.  Psychiatric: She  has a normal mood and affect. Her behavior is normal.    Vital signs in last 24 hours: @VSRANGES @  Labs:   Ht 5'4'' Wt 235 BMI 40.3  Imaging Review Plain radiographs demonstrate severe degenerative joint disease of the right knee(s). The overall alignment isneutral. The bone quality appears to be good for age and reported activity level.  Assessment/Plan:  End stage arthritis, right knee   The patient history, physical examination, clinical judgment of the provider and imaging studies are consistent with end stage degenerative joint disease of the right knee(s) and total knee arthroplasty is deemed medically necessary. The treatment options including medical management, injection therapy arthroscopy and arthroplasty were discussed at length. The risks and benefits of total knee arthroplasty were presented and reviewed. The risks due to aseptic loosening, infection, stiffness, patella tracking problems, thromboembolic complications and other imponderables were discussed. The patient acknowledged the explanation, agreed to proceed with the plan and consent was signed. Patient is being admitted for inpatient treatment for surgery, pain control, PT, OT, prophylactic antibiotics, VTE prophylaxis, progressive ambulation and ADL's and discharge planning. The patient is planning to be discharged to skilled nursing facility [facility]  Patient with nickel allergy will use [employer] Oxinium system possible revision stem due to BMI, no surgical staples.

## 2012-07-19 NOTE — Preoperative (Signed)
Beta Blockers   Reason not to administer Beta Blockers:Not Applicable 

## 2012-07-19 NOTE — Progress Notes (Signed)
CARE MANAGEMENT NOTE 07/19/2012  Patient:  [name], [name]   Account Number:  [id number]  Date Initiated:  07/19/2012  Documentation initiated by:  Vance Peper  Subjective/Objective Assessment:   67 yr old female s/p right total knee arthroplasty.     Action/Plan:   Patient is for shortterm rehab at [facility]. Social Worker is aware.   Anticipated DC Date:  07/22/2012   Anticipated DC Plan:  SKILLED NURSING FACILITY  In-house referral  Clinical Social Worker      DC Planning Services  CM consult      Choice offered to / List presented to:             Status of service:  Completed, signed off Medicare Important Message given?   (If response is "NO", the following Medicare IM given date fields will be blank) Date Medicare IM given:   Date Additional Medicare IM given:    Discharge Disposition:  SKILLED NURSING FACILITY  Per UR Regulation:    If discussed at Long Length of Stay Meetings, dates discussed:    Comments:

## 2012-07-19 NOTE — Progress Notes (Signed)
Orthopedic Tech Progress Note Patient Details:  [name] [facility] October 11, 1945 [id number] CPM applied to Right LE with appropriate settings. OHF applied to bed.  CPM Right Knee CPM Right Knee: On Right Knee Flexion (Degrees): 60  Right Knee Extension (Degrees): 0    Asia R Thompson 07/19/2012, 11:32 AM

## 2012-07-19 NOTE — Progress Notes (Signed)
UR COMPLETED  

## 2012-07-19 NOTE — Anesthesia Preprocedure Evaluation (Signed)
Anesthesia Evaluation  Patient identified by MRN, date of birth, ID band Patient awake    Reviewed: Allergy & Precautions, H&P , NPO status , Patient's Chart, lab work & pertinent test results  Airway Mallampati: I TM Distance: >3 FB Neck ROM: Full    Dental   Pulmonary sleep apnea ,          Cardiovascular     Neuro/Psych    GI/Hepatic GERD-  Poorly Controlled and Medicated,  Endo/Other    Renal/GU      Musculoskeletal   Abdominal   Peds  Hematology   Anesthesia Other Findings   Reproductive/Obstetrics                           Anesthesia Physical Anesthesia Plan  ASA: III  Anesthesia Plan: General   Post-op Pain Management:    Induction: Intravenous, Rapid sequence and Cricoid pressure planned  Airway Management Planned: Oral ETT  Additional Equipment:   Intra-op Plan:   Post-operative Plan: Extubation in OR  Informed Consent: I have reviewed the patients History and Physical, chart, labs and discussed the procedure including the risks, benefits and alternatives for the proposed anesthesia with the patient or authorized representative who has indicated his/her understanding and acceptance.     Plan Discussed with: CRNA and Surgeon  Anesthesia Plan Comments:         Anesthesia Quick Evaluation

## 2012-07-20 LAB — CBC
HCT: 30.9 % — ABNORMAL LOW (ref 36.0–46.0)
MCHC: 32.7 g/dL (ref 30.0–36.0)
RDW: 12.9 % (ref 11.5–15.5)
WBC: 8.2 10*3/uL (ref 4.0–10.5)

## 2012-07-20 LAB — BASIC METABOLIC PANEL
BUN: 14 mg/dL (ref 6–23)
Chloride: 102 mEq/L (ref 96–112)
GFR calc Af Amer: 74 mL/min — ABNORMAL LOW (ref >90)
GFR calc non Af Amer: 64 mL/min — ABNORMAL LOW (ref >90)
Potassium: 3.8 mEq/L (ref 3.5–5.1)
Sodium: 138 mEq/L (ref 135–145)

## 2012-07-20 MED ORDER — ACETAMINOPHEN 10 MG/ML IV SOLN
1000.0000 mg | Freq: Four times a day (QID) | INTRAVENOUS | Status: AC
  Administered 2012-07-21: 1000 mg via INTRAVENOUS
  Filled 2012-07-20 (×4): qty 100

## 2012-07-20 MED ORDER — ACETAMINOPHEN 10 MG/ML IV SOLN
1000.0000 mg | Freq: Four times a day (QID) | INTRAVENOUS | Status: DC
  Filled 2012-07-20 (×3): qty 100

## 2012-07-20 NOTE — Progress Notes (Signed)
Physical Therapy Treatment Patient Details Name: [name] MRN: [id number] DOB: 10/29/1945 Today's Date: 07/20/2012 Time: 1631-1700 PT Time Calculation (min): 29 min  PT Assessment / Plan / Recommendation Comments on Treatment Session  Pt unable to tolerate gait trainning secondary to pain.  Pt upset about sitting in chair all day and not being placed in CPM.  Applied CPM to RLE.  Pt unable to tolerat 0-60 degrees.  Started pt at 0-45 degrees.     Follow Up Recommendations  SNF;Supervision - Intermittent     Does the patient have the potential to tolerate intense rehabilitation     Barriers to Discharge        Equipment Recommendations  Other (comment)    Recommendations for Other Services    Frequency 7X/week   Plan Discharge plan remains appropriate;Frequency remains appropriate    Precautions / Restrictions Precautions Precautions: Fall Required Braces or Orthoses: Knee Immobilizer - Right Knee Immobilizer - Right: On when out of bed or walking Restrictions Weight Bearing Restrictions: Yes RLE Weight Bearing: Weight bearing as tolerated   Pertinent Vitals/Pain 8/10 pain in knee with activity.  Repositioned for pain relief and notified RN.    Mobility  Bed Mobility Bed Mobility: Sit to Supine (Simultaneous filing. User may not have seen previous data.) Supine to Sit: 3: Mod assist Sit to Supine: 3: Mod assist Details for Bed Mobility Assistance: Assist for bilateral LE, cues for technique.  Transfers Transfers: Sit to Stand;Stand to Sit Sit to Stand: 3: Mod assist Stand to Sit: 3: Mod assist Details for Transfer Assistance: VCs for hand placement assist to steady pt and assist to manage pt's body wt.  Manual facilitation to position L LE forward to minmize pain when transitioning from stand to sit.   Ambulation/Gait Ambulation/Gait Assistance: Not tested (comment)    Exercises     PT Diagnosis:    PT Problem List:   PT Treatment Interventions:     PT  Goals Acute Rehab PT Goals PT Goal Formulation: With patient Time For Goal Achievement: 08/03/12 Potential to Achieve Goals: Good Pt will go Supine/Side to Sit: Independently Pt will go Sit to Supine/Side: Independently;with HOB 0 degrees PT Goal: Sit to Supine/Side - Progress: Progressing toward goal Pt will Transfer Bed to Chair/Chair to Bed: with modified independence PT Transfer Goal: Bed to Chair/Chair to Bed - Progress: Progressing toward goal Pt will Ambulate: 51 - 150 feet;with modified independence;with rolling walker Pt will Perform Home Exercise Program: Independently  Visit Information  Assistance Needed: +1    Subjective Data  Subjective: I sat in this chair all day Patient Stated Goal: walk without pain.    Cognition  Overall Cognitive Status: Appears within functional limits for tasks assessed/performed Arousal/Alertness: Awake/alert Orientation Level: Appears intact for tasks assessed Behavior During Session: [facility] for tasks performed    Balance     End of Session PT - End of Session Equipment Utilized During Treatment: Gait belt;Right knee immobilizer Activity Tolerance: Patient limited by pain Patient left: in bed;in CPM;with call bell/phone within reach;with bed alarm set CPM Right Knee CPM Right Knee: On Right Knee Flexion (Degrees): 45  Right Knee Extension (Degrees): 0  Additional Comments: CPM on at 1645   GP     Brenn Deziel 07/20/2012, 5:20 PM Leeann Bady L. Bayan Kushnir DPT [phone]

## 2012-07-20 NOTE — Evaluation (Signed)
Physical Therapy Evaluation Patient Details Name: [name] MRN: [id number] DOB: [birth year] Today's Date: 07/20/2012 Time: 4098-1191 PT Time Calculation (min): 38 min  PT Assessment / Plan / Recommendation Clinical Impression  Pt is a 67 y/o female s/p R TKA.  Pt lives with her spouse who works full time and travels for work.  Spouse will not be available to assist pt at home.  Pt would benefit from continued PT to maximize functional mobility.  Short term SNF most appropriate setting for continuned PT.       PT Assessment  Patient needs continued PT services    Follow Up Recommendations  SNF;Supervision - Intermittent    Does the patient have the potential to tolerate intense rehabilitation      Barriers to Discharge Decreased caregiver support spouse unavailable    Equipment Recommendations  Other (comment) (3 in 1)    Recommendations for Other Services     Frequency 7X/week    Precautions / Restrictions Precautions Precautions: Fall Required Braces or Orthoses: Knee Immobilizer - Right Knee Immobilizer - Right: On when out of bed or walking Restrictions Weight Bearing Restrictions: Yes RLE Weight Bearing: Weight bearing as tolerated   Pertinent Vitals/Pain 3-4/10 pain in R knee.  Pt medicated prior to session.       Mobility  Bed Mobility Bed Mobility: Supine to Sit;Sit to Supine Supine to Sit: 4: Min assist;HOB elevated Details for Bed Mobility Assistance: Assist for RLE cues for technique.  Transfers Transfers: Sit to Stand;Stand to Sit Sit to Stand: 3: Mod assist;From bed;With upper extremity assist Stand to Sit: 3: Mod assist;To chair/3-in-1;With upper extremity assist Details for Transfer Assistance: VCs for hand placement assist to steady pt and assist to manage pt's body wt.  Manual facilitation to position L LE forward to minmize pain when transitioning from stand to sit.   Ambulation/Gait Ambulation/Gait Assistance: 4: Min assist Ambulation  Distance (Feet): 18 Feet Assistive device: Rolling walker Ambulation/Gait Assistance Details: Cues for gait sequencing and WBAT on RLE.  VC to take full step with LLE.   Gait Pattern: Step-to pattern;Decreased stance time - right;Decreased weight shift to right Gait velocity: slow Stairs: No    Shoulder Instructions     Exercises Total Joint Exercises Ankle Circles/Pumps: 10 reps;Both Goniometric ROM: 0-44 degrees AAROM   PT Diagnosis: Difficulty walking;Generalized weakness;Abnormality of gait;Acute pain  PT Problem List: Decreased strength;Decreased range of motion;Decreased activity tolerance;Decreased mobility;Decreased knowledge of use of DME;Decreased knowledge of precautions;Pain;Obesity PT Treatment Interventions: Gait training;Functional mobility training;Therapeutic activities;Therapeutic exercise;Patient/family education;DME instruction   PT Goals Acute Rehab PT Goals PT Goal Formulation: With patient Time For Goal Achievement: 08/03/12 Potential to Achieve Goals: Good Pt will go Supine/Side to Sit: Independently PT Goal: Supine/Side to Sit - Progress: Goal set today Pt will go Sit to Supine/Side: Independently;with HOB 0 degrees PT Goal: Sit to Supine/Side - Progress: Goal set today Pt will Transfer Bed to Chair/Chair to Bed: with modified independence PT Transfer Goal: Bed to Chair/Chair to Bed - Progress: Goal set today Pt will Ambulate: 51 - 150 feet;with modified independence;with rolling walker PT Goal: Ambulate - Progress: Goal set today Pt will Perform Home Exercise Program: Independently PT Goal: Perform Home Exercise Program - Progress: Goal set today  Visit Information  Last PT Received On: 07/20/12 Assistance Needed: +1    Subjective Data  Subjective: Agree to eval.  Patient Stated Goal: walk without pain.    Prior Functioning  Home Living Lives With: Spouse Available Help at  Discharge: Skilled Nursing Facility Home Adaptive Equipment: Dan Humphreys -  rolling;Straight cane Prior Function Level of Independence: Independent with assistive device(s) Able to Take Stairs?: Yes Driving: Yes Vocation: On disability Communication Communication: No difficulties    Cognition  Overall Cognitive Status: Appears within functional limits for tasks assessed/performed Arousal/Alertness: Awake/alert Orientation Level: Appears intact for tasks assessed Behavior During Session: [facility] for tasks performed    Extremity/Trunk Assessment Right Upper Extremity Assessment RUE ROM/Strength/Tone: Within functional levels Left Upper Extremity Assessment LUE ROM/Strength/Tone: Within functional levels Right Lower Extremity Assessment RLE ROM/Strength/Tone: Unable to fully assess Left Lower Extremity Assessment LLE ROM/Strength/Tone: WFL for tasks assessed   Balance    End of Session PT - End of Session Equipment Utilized During Treatment: Gait belt;Right knee immobilizer Activity Tolerance: Patient tolerated treatment well Patient left: in chair;with call bell/phone within reach CPM Right Knee CPM Right Knee: Off  GP     Yeng Frankie 07/20/2012, 10:52 AM Theron Arista L. Randolf Sansoucie DPT [phone]

## 2012-07-20 NOTE — Progress Notes (Signed)
Occupational Therapy Evaluation Patient Details Name: [name] MRN: [id number] DOB: 1946/05/07 Today's Date: 07/20/2012 Time: 4098-1191 OT Time Calculation (min): 12 min  OT Assessment / Plan / Recommendation Clinical Impression  67 yo morbidly obese pt s/p R TKA. Pt will benefit from SNF prior to return home with husband. All further OT to be addressed at next venue of care.    OT Assessment  All further OT needs can be met in the next venue of care    Follow Up Recommendations  SNF    Barriers to Discharge  decreased caregiver support. Husband works out of town.    Equipment Recommendations  None recommended by OT (TBA at SNF)    Recommendations for Other Services  none  Frequency    eval only   Precautions / Restrictions Precautions Precautions: Fall Required Braces or Orthoses: Knee Immobilizer - Right Knee Immobilizer - Right: On when out of bed or walking Restrictions Weight Bearing Restrictions: Yes RLE Weight Bearing: Weight bearing as tolerated   Pertinent Vitals/Pain 10. Pain meds given    ADL  Grooming: Set up Where Assessed - Grooming: Unsupported sitting Upper Body Bathing: Set up Where Assessed - Upper Body Bathing: Unsupported sitting Lower Body Bathing: Moderate assistance Where Assessed - Lower Body Bathing: Supported sit to stand Upper Body Dressing: Set up Where Assessed - Upper Body Dressing: Unsupported sitting Lower Body Dressing: Moderate assistance Where Assessed - Lower Body Dressing: Supported sit to Pharmacist, hospital: Moderate assistance;Simulated Toilet Transfer Method: Sit to Barista: Other (comment) (bed - bed) Equipment Used: Knee Immobilizer;Rolling walker;Gait belt Transfers/Ambulation Related to ADLs: mod A. Pt fatigues this pm and required increased assistance ADL Comments: limited by pain, mobility and body habitus    OT Diagnosis: Generalized weakness;Acute pain  OT Problem List: Decreased  strength;Decreased range of motion;Decreased activity tolerance;Decreased knowledge of use of DME or AE;Decreased knowledge of precautions;Obesity;Pain;Increased edema OT Treatment Interventions:     OT Goals Acute Rehab OT Goals OT Goal Formulation: With patient (Eval only)  Visit Information  Last OT Received On: 07/20/12 Assistance Needed: +1    Subjective Data   I want to go to [facility]   Prior Functioning     Home Living Lives With: Spouse Available Help at Discharge: Skilled Nursing Facility Home Adaptive Equipment: Walker - rolling;Straight cane Prior Function Level of Independence: Independent with assistive device(s) Able to Take Stairs?: Yes Driving: Yes Vocation: On disability Communication Communication: No difficulties Dominant Hand: Right         Vision/Perception     Cognition  Overall Cognitive Status: Appears within functional limits for tasks assessed/performed Arousal/Alertness: Awake/alert Orientation Level: Appears intact for tasks assessed Behavior During Session: [facility] for tasks performed    Extremity/Trunk Assessment Right Upper Extremity Assessment RUE ROM/Strength/Tone: Deficits RUE ROM/Strength/Tone Deficits: ruptured R biceps tendon RUE Sensation: WFL - Light Touch;WFL - Proprioception Left Upper Extremity Assessment LUE ROM/Strength/Tone: WFL for tasks assessed LUE Sensation: WFL - Light Touch;WFL - Proprioception Right Lower Extremity Assessment RLE ROM/Strength/Tone: Unable to fully assess;Due to pain (TKA) Left Lower Extremity Assessment LLE ROM/Strength/Tone: [facility] for tasks assessed Trunk Assessment Trunk Assessment: Normal     Mobility Bed Mobility Bed Mobility: Sit to Supine (Simultaneous filing. User may not have seen previous data.) Supine to Sit: 3: Mod assist Sit to Supine: 3: Mod assist Details for Bed Mobility Assistance: Assist for bilateral LE, cues for technique.  Transfers Transfers: Sit to Stand;Stand to  Sit Sit to Stand: 3:  Mod assist Stand to Sit: 3: Mod assist Details for Transfer Assistance: VCs for hand placement assist to steady pt and assist to manage pt's body wt.  Manual facilitation to position L LE forward to minmize pain when transitioning from stand to sit.       Shoulder Instructions     Exercise     Balance  S   End of Session OT - End of Session Activity Tolerance: Patient limited by pain;Patient limited by fatigue Patient left: in bed;with call bell/phone within reach;Other (comment) (in CPM) Nurse Communication: Mobility status CPM Right Knee CPM Right Knee: On Right Knee Flexion (Degrees): 45  Right Knee Extension (Degrees): 0  Additional Comments: CPM on at 1645  GO     Beth Spackman,HILLARY 07/20/2012, 5:20 PM [facility], OTR/L  [phone] 07/20/2012

## 2012-07-20 NOTE — Progress Notes (Signed)
SPORTS MEDICINE AND JOINT REPLACEMENT  Georgena Spurling, MD   Altamese Cabal, PA-C [address] [place], [place], [place]  [address]                             [phone]   PROGRESS NOTE  Subjective:  negative for Chest Pain  negative for Shortness of Breath  negative for Nausea/Vomiting   negative for Calf Pain  negative for Bowel Movement   Tolerating Diet: yes         Patient reports pain as 5 on 0-10 scale.    Objective: Vital signs in last 24 hours:   Patient Vitals for the past 24 hrs:  BP Temp Temp src Pulse Resp SpO2 Height Weight  07/20/12 0539 113/51 mmHg 98.2 F (36.8 C) - 73  20  98 % - -  07/20/12 0154 120/59 mmHg 99.1 F (37.3 C) - 79  18  96 % - -  07/19/12 2043 123/51 mmHg 97.9 F (36.6 C) - 82  20  97 % - -  07/19/12 1730 126/62 mmHg 98.1 F (36.7 C) - 76  20  97 % - -  07/19/12 1552 135/62 mmHg 98.5 F (36.9 C) - 78  20  97 % - -  07/19/12 1245 124/68 mmHg 98.2 F (36.8 C) Oral 68  18  94 % 5\' 4"  (1.626 m) 122.471 kg (270 lb)  07/19/12 1200 - 97.5 F (36.4 C) - - - - - -  07/19/12 1022 - 97.8 F (36.6 C) - - - - - -    @flow {1959:LAST@   Intake/Output from previous day:   01/31 0701 - 02/01 0700 In: 2918.8 [P.O.:360; I.V.:2203.8] Out: 1745 [Urine:1650; Drains:65]   Intake/Output this shift:       Intake/Output      01/31 0701 - 02/01 0700 02/01 0701 - 02/02 0700   P.O. 360    I.V. (mL/kg) 2203.8 (18)    IV Piggyback 355    Total Intake(mL/kg) 2918.8 (23.8)    Urine (mL/kg/hr) 1650 (0.6)    Drains 65    Blood 30    Total Output 1745    Net +1173.8            LABORATORY DATA:  Basename 07/20/12 0623 07/19/12 1320  WBC 8.2 14.4*  HGB 10.1* 11.9*  HCT 30.9* 36.0  PLT 260 276    Basename 07/20/12 0623 07/19/12 1320  NA 138 --  K 3.8 --  CL 102 --  CO2 29 --  BUN 14 --  CREATININE 0.92 0.84  GLUCOSE 118* --  CALCIUM 9.5 --   Lab Results  Component Value Date   INR 0.93 07/11/2012   INR 1.0 12/17/2006     Examination:  General appearance: alert, cooperative and no distress Extremities: Homans sign is negative, no sign of DVT  Wound Exam: clean, dry, intact   Drainage:  Scant/small amount Serosanguinous exudate  Motor Exam: EHL and FHL Intact  Sensory Exam: Deep Peroneal normal   Assessment:    1 Day Post-Op  Procedure(s) (LRB): TOTAL KNEE ARTHROPLASTY (Right)  ADDITIONAL DIAGNOSIS:  Active Problems:  * No active hospital problems. *   Acute Blood Loss Anemia   Plan: Physical Therapy as ordered Weight Bearing as Tolerated (WBAT)  DVT Prophylaxis:  Lovenox  DISCHARGE PLAN: Skilled Nursing Facility/Rehab  DISCHARGE NEEDS: HHPT, CPM, Walker and 3-in-1 comode seat         [name] 07/20/2012,  9:58 AM

## 2012-07-21 LAB — CBC
HCT: 30.5 % — ABNORMAL LOW (ref 36.0–46.0)
Hemoglobin: 10 g/dL — ABNORMAL LOW (ref 12.0–15.0)
RBC: 3.22 MIL/uL — ABNORMAL LOW (ref 3.87–5.11)
WBC: 8.6 10*3/uL (ref 4.0–10.5)

## 2012-07-21 NOTE — Clinical Social Work Placement (Signed)
Clinical Social Work Department CLINICAL SOCIAL WORK PLACEMENT NOTE 07/21/2012  Patient:  [name], [name]  Account Number:  [id number] Admit date:  07/19/2012  Clinical Social Worker:  Skip Mayer  Date/time:  07/21/2012 04:00 PM  Clinical Social Work is seeking post-discharge placement for this patient at the following level of care:   SKILLED NURSING   (*CSW will update this form in Epic as items are completed)   07/21/2012  Patient/family provided with [facility] Department of Clinical Social Work's list of facilities offering this level of care within the geographic area requested by the patient (or if unable, by the patient's family).  07/21/2012  Patient/family informed of their freedom to choose among providers that offer the needed level of care, that participate in Medicare, Medicaid or managed care program needed by the patient, have an available bed and are willing to accept the patient.  07/21/2012  Patient/family informed of [facility]' ownership interest in [facility], as well as of the fact that they are under no obligation to receive care at this facility.  PASARR submitted to EDS on 07/21/2012 PASARR number received from EDS on 07/21/2012  FL2 transmitted to all facilities in geographic area requested by pt/family on  07/21/2012 FL2 transmitted to all facilities within larger geographic area on   Patient informed that his/her managed care company has contracts with or will negotiate with  certain facilities, including the following:     Patient/family informed of bed offers received:   Patient chooses bed at  Physician recommends and patient chooses bed at    Patient to be transferred to  on   Patient to be transferred to facility by   The following physician request were entered in Epic:   Additional Comments:

## 2012-07-21 NOTE — Progress Notes (Signed)
SPORTS MEDICINE AND JOINT REPLACEMENT  Georgena Spurling, MD   Altamese Cabal, PA-C [address] [place], [place], [place]  [address]                             [phone]   PROGRESS NOTE  Subjective:  negative for Chest Pain  negative for Shortness of Breath  negative for Nausea/Vomiting   negative for Calf Pain  negative for Bowel Movement   Tolerating Diet: yes         Patient reports pain as 5 on 0-10 scale.    Objective: Vital signs in last 24 hours:   Patient Vitals for the past 24 hrs:  BP Temp Temp src Pulse Resp SpO2  07/21/12 0737 124/54 mmHg 98.8 F (37.1 C) Oral 87  16  92 %  07/21/12 0130 - 99.6 F (37.6 C) Oral - - -  07/21/12 0011 - - - - - 93 %  07/20/12 2309 132/56 mmHg 102 F (38.9 C) Oral 107  16  85 %  07/20/12 1500 129/54 mmHg 100.4 F (38 C) Oral 87  18  96 %    @flow {1959:LAST@   Intake/Output from previous day:   02/01 0701 - 02/02 0700 In: 480 [P.O.:480] Out: 1800 [Urine:1750; Drains:50]   Intake/Output this shift:       Intake/Output      02/01 0701 - 02/02 0700 02/02 0701 - 02/03 0700   P.O. 480    I.V. (mL/kg)     IV Piggyback     Total Intake(mL/kg) 480 (3.9)    Urine (mL/kg/hr) 1750 (0.6)    Drains 50    Blood     Total Output 1800    Net -1320            LABORATORY DATA:  Basename 07/21/12 0620 07/20/12 0623 07/19/12 1320  WBC 8.6 8.2 14.4*  HGB 10.0* 10.1* 11.9*  HCT 30.5* 30.9* 36.0  PLT 232 260 276    Basename 07/20/12 0623 07/19/12 1320  NA 138 --  K 3.8 --  CL 102 --  CO2 29 --  BUN 14 --  CREATININE 0.92 0.84  GLUCOSE 118* --  CALCIUM 9.5 --   Lab Results  Component Value Date   INR 0.93 07/11/2012   INR 1.0 12/17/2006    Examination:  General appearance: alert, cooperative and no distress Extremities: Homans sign is negative, no sign of DVT  Wound Exam: clean, dry, intact   Drainage:  None: wound tissue dry  Motor Exam: EHL and FHL Intact  Sensory Exam: Deep Peroneal normal   Assessment:     2 Days Post-Op  Procedure(s) (LRB): TOTAL KNEE ARTHROPLASTY (Right)  ADDITIONAL DIAGNOSIS:  Active Problems:  * No active hospital problems. *   Acute Blood Loss Anemia   Plan: Physical Therapy as ordered Weight Bearing as Tolerated (WBAT)  DVT Prophylaxis:  Lovenox  DISCHARGE PLAN: Skilled Nursing Facility/Rehab  DISCHARGE NEEDS: HHPT, CPM, Walker and 3-in-1 comode seat         Jewelia Bocchino 07/21/2012, 10:06 AM

## 2012-07-21 NOTE — Progress Notes (Signed)
Physical Therapy Progress Note  07/21/12 1339  PT Visit Information  Last PT Received On 07/21/12  Assistance Needed +1  PT Time Calculation  PT Start Time 1309  PT Stop Time 1338  PT Time Calculation (min) 29 min  Subjective Data  Subjective "I'm ready to get back in bed."  Precautions  Precautions Knee;Fall  Required Braces or Orthoses Knee Immobilizer - Right  Knee Immobilizer - Right On when out of bed or walking  Restrictions  Weight Bearing Restrictions Yes  RLE Weight Bearing WBAT  Cognition  Overall Cognitive Status Appears within functional limits for tasks assessed/performed  Arousal/Alertness Awake/alert  Orientation Level Appears intact for tasks assessed  Behavior During Session [facility] for tasks performed  Bed Mobility  Bed Mobility Sit to Supine  Sit to Supine 4: Min assist;HOB flat  Details for Bed Mobility Assistance Verbal cues for technique.  Assist to raise LE onto bed.  Transfers  Transfers Sit to Stand;Stand to Sit  Sit to Stand 3: Mod assist;With upper extremity assist;With armrests;From chair/3-in-1  Stand to Sit 4: Min assist;With upper extremity assist;With armrests;To chair/3-in-1;To bed  Details for Transfer Assistance Verbal cues for hand placement and placement of RLE during transitions  Ambulation/Gait  Ambulation/Gait Assistance 4: Min assist  Ambulation Distance (Feet) 42 Feet  Assistive device Rolling walker  Ambulation/Gait Assistance Details Cues to stand upright.  Safe use of RW.  Gait Pattern Step-to pattern;Decreased stance time - right;Decreased weight shift to right  Gait velocity slow  Exercises  Exercises Total Joint  Total Joint Exercises  Ankle Circles/Pumps AROM;Both;10 reps;Supine  Quad Sets AROM;Right;10 reps;Supine  Short Arc Quad AAROM;Right;10 reps;Supine  Heel Slides AAROM;Right;10 reps;Supine  Hip ABduction/ADduction AAROM;Right;10 reps;Supine  PT - End of Session  Equipment Utilized During Treatment Gait belt;Right knee  immobilizer  Activity Tolerance Patient limited by pain;Patient limited by fatigue  Patient left in bed;in CPM;with call bell/phone within reach  Nurse Communication Mobility status (CPM reapplied at 13:30)  PT - Assessment/Plan  Comments on Treatment Session Mobility continues to improve.  Pain remains limiting factor.  PT Plan Discharge plan remains appropriate;Frequency remains appropriate  PT Frequency 7X/week  Follow Up Recommendations SNF  PT equipment Other (comment) (3-in-1)  Acute Rehab PT Goals  PT Goal: Sit to Supine/Side - Progress Progressing toward goal  PT Transfer Goal: Bed to Chair/Chair to Bed - Progress Progressing toward goal  PT Goal: Ambulate - Progress Progressing toward goal  PT Goal: Perform Home Exercise Program - Progress Progressing toward goal  PT General Charges  $$ ACUTE PT VISIT 1 Procedure  PT Treatments  $Gait Training 8-22 mins  $Therapeutic Exercise 8-22 mins   Durenda Hurt. Renaldo Fiddler, [facility] Acute Rehab Services Pager [phone]

## 2012-07-21 NOTE — Clinical Social Work Psychosocial (Signed)
Clinical Social Work Department BRIEF PSYCHOSOCIAL ASSESSMENT 07/21/2012  Patient:  AZRAEL, HUSS     Account Number:  [id number]     Admit date:  07/19/2012  Clinical Social Worker:  Skip Mayer  Date/Time:  07/21/2012 04:00 PM  Referred by:  Physician  Date Referred:  07/21/2012 Referred for  SNF Placement   Other Referral:   Interview type:  Patient Other interview type:    PSYCHOSOCIAL DATA Living Status:  FAMILY Admitted from facility:   Level of care:   Primary support name:  Molly Maduro Primary support relationship to patient:  SPOUSE Degree of support available:   Adequate, per pt    CURRENT CONCERNS Current Concerns  Post-Acute Placement   Other Concerns:    SOCIAL WORK ASSESSMENT / PLAN CSW met with pt re: PT/OT recommendation for SNF.  CSW explained placement process and answered questions.  Pt reports preference is [facility].  CSW completed FL2 and initiated SNF search.  Weekday CSW to f/u with offers.   Assessment/plan status:  Information/Referral to [facility] Other assessment/ plan:   Information/referral to community resources:   SNF  PTAR    PATIENT'S/FAMILY'S RESPONSE TO PLAN OF CARE: Pt reports agreeable to ST SNF stay at [facility], [facility], or [facility].  Pt verbalized understanding of placement process and appreciation for CSW assist.        Dellie Burns, MSW, LCSWA [phone] (Weekends 8:00am-4:30pm)

## 2012-07-21 NOTE — Progress Notes (Signed)
RT Note: Pt placed on CPAP 8cmH20 with a full face mask and 3L 02 bleed in. Pt tolerating well. RT/RN will continue to monitor.

## 2012-07-21 NOTE — Progress Notes (Signed)
RT Note: placed pt on CPAP 8cmH20 with 3L 02 bleed in and a full face mask. Pt tolerating well. RT will continue to monitor.

## 2012-07-21 NOTE — Progress Notes (Signed)
Physical Therapy Treatment Patient Details Name: [name] MRN: [id number] DOB: [birth year] Today's Date: 07/21/2012 Time: 6295-2841 PT Time Calculation (min): 20 min  PT Assessment / Plan / Recommendation Comments on Treatment Session  Improved with ambulation.  Pain continues to impact mobility.    Follow Up Recommendations  SNF     Does the patient have the potential to tolerate intense rehabilitation     Barriers to Discharge        Equipment Recommendations  Other (comment) (3-in-1)    Recommendations for Other Services    Frequency 7X/week   Plan Discharge plan remains appropriate;Frequency remains appropriate    Precautions / Restrictions Precautions Precautions: Knee;Fall Required Braces or Orthoses: Knee Immobilizer - Right Knee Immobilizer - Right: On when out of bed or walking Restrictions Weight Bearing Restrictions: Yes RLE Weight Bearing: Weight bearing as tolerated   Pertinent Vitals/Pain Pain 8/10 - impacting mobility    Mobility  Bed Mobility Bed Mobility: Not assessed Transfers Transfers: Sit to Stand;Stand to Sit Sit to Stand: 3: Mod assist;With upper extremity assist;With armrests;From chair/3-in-1 Stand to Sit: 3: Mod assist;With upper extremity assist;With armrests;To chair/3-in-1 Details for Transfer Assistance: Verbal cues for hand placement.  Assist to rise from [facility]. Ambulation/Gait Ambulation/Gait Assistance: 4: Min assist Ambulation Distance (Feet): 96 Feet Assistive device: Rolling walker Ambulation/Gait Assistance Details: Verbal cues for gait sequence and to stand upright during gait. Gait Pattern: Step-to pattern;Decreased stance time - right;Decreased weight shift to right Gait velocity: slow    Exercises Total Joint Exercises Ankle Circles/Pumps: AROM;Both;10 reps;Seated Quad Sets: AROM;Right;10 reps;Seated     PT Goals Acute Rehab PT Goals PT Goal: Ambulate - Progress: Progressing toward goal PT Goal: Perform Home  Exercise Program - Progress: Progressing toward goal  Visit Information  Last PT Received On: 07/21/12 Assistance Needed: +1    Subjective Data  Subjective: "Please don't let me stay in this chair all day"   Cognition  Overall Cognitive Status: Appears within functional limits for tasks assessed/performed Arousal/Alertness: Awake/alert Orientation Level: Appears intact for tasks assessed Behavior During Session: [facility] for tasks performed    Balance     End of Session PT - End of Session Equipment Utilized During Treatment: Gait belt;Right knee immobilizer Activity Tolerance: Patient limited by pain Patient left: in chair;with call bell/phone within reach Nurse Communication: Mobility status   GP     Vena Austria 07/21/2012, 12:34 PM Durenda Hurt. Renaldo Fiddler, [facility] Acute Rehab Services Pager [phone]

## 2012-07-22 ENCOUNTER — Encounter (HOSPITAL_COMMUNITY): Payer: Self-pay | Admitting: Orthopedic Surgery

## 2012-07-22 LAB — CBC
HCT: 29.4 % — ABNORMAL LOW (ref 36.0–46.0)
Hemoglobin: 9.6 g/dL — ABNORMAL LOW (ref 12.0–15.0)
MCH: 30.8 pg (ref 26.0–34.0)
MCV: 94.2 fL (ref 78.0–100.0)
Platelets: 234 10*3/uL (ref 150–400)
RBC: 3.12 MIL/uL — ABNORMAL LOW (ref 3.87–5.11)
WBC: 9.1 10*3/uL (ref 4.0–10.5)

## 2012-07-22 MED ORDER — OXYCODONE-ACETAMINOPHEN 5-325 MG PO TABS: ORAL_TABLET | ORAL | Status: DC

## 2012-07-22 MED ORDER — ENOXAPARIN SODIUM 30 MG/0.3ML ~~LOC~~ SOLN: 30.0000 mg | Freq: Two times a day (BID) | SUBCUTANEOUS | Status: DC

## 2012-07-22 MED ORDER — METHOCARBAMOL 500 MG PO TABS: 500.0000 mg | ORAL_TABLET | Freq: Four times a day (QID) | ORAL | Status: DC | PRN

## 2012-07-22 MED ORDER — DSS 100 MG PO CAPS: 100.0000 mg | ORAL_CAPSULE | Freq: Two times a day (BID) | ORAL | Status: DC

## 2012-07-22 NOTE — Discharge Summary (Signed)
Skilled Nursing Facility/Rehab  PATIENT ID: [name]        MRN:  [id number]          DOB/AGE: 1945/12/26 / 67 y.o.    DISCHARGE SUMMARY  ADMISSION DATE:    07/19/2012 DISCHARGE DATE:   07/22/2012   ADMISSION DIAGNOSIS: OA RIGHT KNEE    DISCHARGE DIAGNOSIS:  OA RIGHT KNEE    ADDITIONAL DIAGNOSIS: Active Problems:  * No active hospital problems. *   Past Medical History  Diagnosis Date  . Swelling of both ankles   . GERD (gastroesophageal reflux disease)     barretts  . ADD (attention deficit disorder)   . Anxiety   . Arthritis     os  . Sleep apnea     cpap   last sleep study> 3 yrs    PROCEDURE: Procedure(s): TOTAL KNEE ARTHROPLASTY  Right  on 07/19/2012  CONSULTS:     HISTORY: see admission H&P   HOSPITAL COURSE:  [name] is a 67 y.o. admitted on 07/19/2012 and found to have a diagnosis of OA RIGHT KNEE.  After appropriate laboratory studies were obtained  they were taken to the operating room on 07/19/2012 and underwent  Right Procedure(s): TOTAL KNEE ARTHROPLASTY.   They were given perioperative antibiotics:  Anti-infectives     Start     Dose/Rate Route Frequency Ordered Stop   07/19/12 1330   ceFAZolin (ANCEF) IVPB 2 g/50 mL premix        2 g 100 mL/hr over 30 Minutes Intravenous Every 6 hours 07/19/12 1252 07/19/12 2050   07/19/12 0600   ceFAZolin (ANCEF) 3 g in dextrose 5 % 50 mL IVPB        3 g 160 mL/hr over 30 Minutes Intravenous On call to O.R. 07/18/12 1454 07/19/12 0732        .  Tolerated the procedure well.  Placed with a foley intraoperatively.  Given Ofirmev at induction and for 24 hours.    POD #1, allowed out of bed to a chair.  PT for ambulation and exercise program.  Foley D/C'd in morning.  IV saline locked.  O2 discontionued.   POD #2, continued PT and ambulation.   Hemovac pulled. Dressing changed.   The remainder of the hospital course was dedicated to ambulation and strengthening.   The patient was discharged on 3  Days Post-Op in  Stable condition.  Blood products given:  none  DIAGNOSTIC STUDIES: Recent vital signs: Patient Vitals for the past 24 hrs:  BP Temp Temp src Pulse Resp SpO2  07/22/12 0652 119/55 mmHg 98.9 F (37.2 C) Oral 80  18  100 %  07/22/12 0400 - - - - 19  -  07/22/12 0000 - - - - 18  -  2012-08-06 2153 133/68 mmHg 98.9 F (37.2 C) Oral 95  18  92 %  06-Aug-2012 2000 - - - - 18  -  2012-08-06 1400 106/50 mmHg 98.8 F (37.1 C) Oral 82  18  93 %       Recent laboratory studies:  Basename 07/22/12 0918 Aug 06, 2012 0620 07/20/12 0623 07/19/12 1320  WBC 9.1 8.6 8.2 14.4*  HGB 9.6* 10.0* 10.1* 11.9*  HCT 29.4* 30.5* 30.9* 36.0  PLT 234 232 260 276    Basename 07/20/12 0623 07/19/12 1320  NA 138 --  K 3.8 --  CL 102 --  CO2 29 --  BUN 14 --  CREATININE 0.92 0.84  GLUCOSE 118* --  CALCIUM  9.5 --   Lab Results  Component Value Date   INR 0.93 07/11/2012   INR 1.0 12/17/2006     Recent Radiographic Studies :  Dg Chest 2 View  07/11/2012  *RADIOLOGY REPORT*  Clinical Data: Knee osteoarthritis. Pre-op respiratory exam. Orthopnea.  CHEST - 2 VIEW  Comparison:  12/17/2006  Findings:  The heart size and mediastinal contours are within normal limits.  Both lungs are clear.  The visualized skeletal structures are unremarkable.  IMPRESSION: No active cardiopulmonary disease.   Original Report Authenticated By: Myles Rosenthal, M.D.     DISCHARGE INSTRUCTIONS:   DISCHARGE MEDICATIONS:     Medication List     As of 07/22/2012 12:32 PM    STOP taking these medications         HYDROcodone-acetaminophen 5-325 MG per tablet   Commonly known as: NORCO/VICODIN      meloxicam 15 MG tablet   Commonly known as: MOBIC      sulindac 200 MG tablet   Commonly known as: CLINORIL      TAKE these medications         citalopram 40 MG tablet   Commonly known as: CELEXA   Take 40 mg by mouth daily.      fexofenadine 180 MG tablet   Commonly known as: ALLEGRA   Take 180 mg by mouth daily.       fish oil-omega-3 fatty acids 1000 MG capsule   Take 2 g by mouth daily.      fluticasone 50 MCG/ACT nasal spray   Commonly known as: FLONASE   Place 2 sprays into the nose daily.      hydrochlorothiazide 25 MG tablet   Commonly known as: HYDRODIURIL   Take 25 mg by mouth daily.      methylphenidate 10 MG tablet   Commonly known as: RITALIN   Take 10 mg by mouth daily.      multivitamins ther. w/minerals Tabs   Take 1 tablet by mouth daily.      ranitidine 150 MG tablet   Commonly known as: ZANTAC   Take 150 mg by mouth daily.      simvastatin 20 MG tablet   Commonly known as: ZOCOR   Take 20 mg by mouth every evening.      Vitamin D3 1000 UNITS Caps   Take 3 capsules by mouth daily.        FOLLOW UP VISIT:       Follow-up Information    Schedule an appointment as soon as possible for a visit with CAFFREY JR,W D, MD. (to be seen on 08/01/12)    Contact information:   [address] [place] [address] [phone]          DISPOSITION:  SNF  CONDITION:  {Stable  Margart Sickles 07/22/2012, 12:32 PM

## 2012-07-22 NOTE — Progress Notes (Signed)
Physical Therapy Treatment Patient Details Name: [name] MRN: [id number] DOB: 12/23/1945 Today's Date: 07/22/2012 Time: 4098-1191 PT Time Calculation (min): 12 min  PT Assessment / Plan / Recommendation Comments on Treatment Session  pt presents with R TKA.  pt noting having been up to bathroom multiple times today and that she is fatigued, but does agree to ambulate into hallway.      Follow Up Recommendations  SNF     Does the patient have the potential to tolerate intense rehabilitation     Barriers to Discharge        Equipment Recommendations   (3-in-1)    Recommendations for Other Services    Frequency 7X/week   Plan Discharge plan remains appropriate;Frequency remains appropriate    Precautions / Restrictions Precautions Precautions: Knee;Fall Required Braces or Orthoses: Knee Immobilizer - Right Knee Immobilizer - Right: On when out of bed or walking Restrictions Weight Bearing Restrictions: Yes RLE Weight Bearing: Weight bearing as tolerated   Pertinent Vitals/Pain Indicates pain 5/10.      Mobility  Bed Mobility Bed Mobility: Supine to Sit;Sitting - Scoot to Edge of Bed Supine to Sit: 4: Min assist Sitting - Scoot to [place] of Bed: 4: Min assist Details for Bed Mobility Assistance: cues for sequencing and A for R LE only.   Transfers Transfers: Sit to Stand;Stand to Sit Sit to Stand: 4: Min assist;With upper extremity assist;From bed Stand to Sit: 4: Min guard;With upper extremity assist;To bed Details for Transfer Assistance: cues for UE use and controlling descent to bed when sitting.   Ambulation/Gait Ambulation/Gait Assistance: 4: Min guard Ambulation Distance (Feet): 60 Feet Assistive device: Rolling walker Ambulation/Gait Assistance Details: cues for positioning in RW, upright posture.   Gait Pattern: Step-to pattern;Decreased step length - left;Decreased stance time - right;Decreased stride length;Trunk flexed Stairs: No Wheelchair  Mobility Wheelchair Mobility: No    Exercises     PT Diagnosis:    PT Problem List:   PT Treatment Interventions:     PT Goals Acute Rehab PT Goals Time For Goal Achievement: 08/03/12 Potential to Achieve Goals: Good PT Goal: Supine/Side to Sit - Progress: Progressing toward goal PT Goal: Sit to Supine/Side - Progress: Progressing toward goal PT Goal: Ambulate - Progress: Progressing toward goal  Visit Information  Last PT Received On: 07/22/12 Assistance Needed: +1    Subjective Data  Subjective: I have already been up today.     Cognition  Cognition Overall Cognitive Status: Appears within functional limits for tasks assessed/performed Arousal/Alertness: Awake/alert Orientation Level: Appears intact for tasks assessed Behavior During Session: [facility] for tasks performed    Balance  Balance Balance Assessed: No  End of Session PT - End of Session Equipment Utilized During Treatment: Gait belt;Right knee immobilizer Activity Tolerance: Patient limited by pain;Patient limited by fatigue Patient left: in bed;with call bell/phone within reach;with nursing in room (Sitting EOB with RN) Nurse Communication: Mobility status   GP     Reagen Haberman, Alison Murray, PT [phone] 07/22/2012, 1:27 PM

## 2012-07-22 NOTE — Progress Notes (Signed)
Subjective: 3 Days Post-Op Procedure(s) (LRB): TOTAL KNEE ARTHROPLASTY (Right) Patient reports pain as mild and moderate.    Objective: Vital signs in last 24 hours: Temp:  [98.8 F (37.1 C)-98.9 F (37.2 C)] 98.9 F (37.2 C) (02/03 0652) Pulse Rate:  [80-95] 80  (02/03 0652) Resp:  [18-19] 18  (02/03 0652) BP: (106-133)/(50-68) 119/55 mmHg (02/03 0652) SpO2:  [92 %-100 %] 100 % (02/03 0652)  Intake/Output from previous day: 02/02 0701 - 02/03 0700 In: 680 [P.O.:680] Out: -  Intake/Output this shift:     Basename 07/22/12 0918 07/21/12 0620 07/20/12 0623 07/19/12 1320  HGB 9.6* 10.0* 10.1* 11.9*    Basename 07/22/12 0918 07/21/12 0620  WBC 9.1 8.6  RBC 3.12* 3.22*  HCT 29.4* 30.5*  PLT 234 232    Basename 07/20/12 0623 07/19/12 1320  NA 138 --  K 3.8 --  CL 102 --  CO2 29 --  BUN 14 --  CREATININE 0.92 0.84  GLUCOSE 118* --  CALCIUM 9.5 --   No results found for this basename: LABPT:2,INR:2 in the last 72 hours  Neurovascular intact Sensation intact distally Intact pulses distally Dorsiflexion/Plantar flexion intact Incision: dressing C/D/I and scant drainage No cellulitis present Compartment soft  Assessment/Plan: 3 Days Post-Op Procedure(s) (LRB): TOTAL KNEE ARTHROPLASTY (Right) Up with therapy Discharge to SNF Discussed FDA warning as addressed by pharmacy with her home dose she is to discuss further with prescriber to determine appropriate dosing.  Margart Sickles 07/22/2012, 12:30 PM

## 2013-05-07 ENCOUNTER — Other Ambulatory Visit: Payer: Self-pay

## 2013-05-07 DIAGNOSIS — Z1231 Encounter for screening mammogram for malignant neoplasm of breast: Secondary | ICD-10-CM

## 2013-06-06 ENCOUNTER — Ambulatory Visit
Admission: RE | Admit: 2013-06-06 | Discharge: 2013-06-06 | Disposition: A | Discharge status: Home / Self Care | Payer: Medicare Other | Source: Ambulatory Visit

## 2013-06-06 DIAGNOSIS — Z1231 Encounter for screening mammogram for malignant neoplasm of breast: Secondary | ICD-10-CM

## 2014-03-26 ENCOUNTER — Other Ambulatory Visit: Payer: Self-pay

## 2014-06-09 ENCOUNTER — Other Ambulatory Visit: Payer: Self-pay | Admitting: Orthopedic Surgery

## 2014-06-09 DIAGNOSIS — M5416 Radiculopathy, lumbar region: Secondary | ICD-10-CM

## 2014-06-14 ENCOUNTER — Ambulatory Visit
Admission: RE | Admit: 2014-06-14 | Discharge: 2014-06-14 | Disposition: A | Discharge status: Home / Self Care | Payer: Medicare Other | Source: Ambulatory Visit | Attending: Orthopedic Surgery | Admitting: Orthopedic Surgery

## 2014-06-14 DIAGNOSIS — M5416 Radiculopathy, lumbar region: Secondary | ICD-10-CM

## 2014-08-19 ENCOUNTER — Other Ambulatory Visit: Payer: Self-pay

## 2014-08-19 DIAGNOSIS — Z1231 Encounter for screening mammogram for malignant neoplasm of breast: Secondary | ICD-10-CM

## 2014-08-19 DIAGNOSIS — Z853 Personal history of malignant neoplasm of breast: Secondary | ICD-10-CM

## 2014-09-11 ENCOUNTER — Ambulatory Visit
Admission: RE | Admit: 2014-09-11 | Discharge: 2014-09-11 | Disposition: A | Discharge status: Home / Self Care | Payer: Medicare Other | Source: Ambulatory Visit

## 2014-09-11 DIAGNOSIS — Z853 Personal history of malignant neoplasm of breast: Secondary | ICD-10-CM

## 2014-09-11 DIAGNOSIS — Z1231 Encounter for screening mammogram for malignant neoplasm of breast: Secondary | ICD-10-CM

## 2014-12-14 ENCOUNTER — Other Ambulatory Visit: Payer: Self-pay

## 2015-02-02 ENCOUNTER — Other Ambulatory Visit: Payer: Self-pay | Admitting: Family Medicine

## 2015-02-02 DIAGNOSIS — R220 Localized swelling, mass and lump, head: Secondary | ICD-10-CM

## 2015-02-05 ENCOUNTER — Ambulatory Visit
Admission: RE | Admit: 2015-02-05 | Discharge: 2015-02-05 | Disposition: A | Discharge status: Home / Self Care | Payer: Medicare Other | Source: Ambulatory Visit | Attending: Family Medicine | Admitting: Family Medicine

## 2015-02-05 DIAGNOSIS — R220 Localized swelling, mass and lump, head: Secondary | ICD-10-CM

## 2015-08-12 ENCOUNTER — Other Ambulatory Visit: Payer: Self-pay

## 2015-08-12 DIAGNOSIS — Z1231 Encounter for screening mammogram for malignant neoplasm of breast: Secondary | ICD-10-CM

## 2015-08-18 ENCOUNTER — Other Ambulatory Visit: Payer: Self-pay | Admitting: Gastroenterology

## 2015-09-14 ENCOUNTER — Ambulatory Visit
Admission: RE | Admit: 2015-09-14 | Discharge: 2015-09-14 | Disposition: A | Discharge status: Home / Self Care | Payer: Medicare Other | Source: Ambulatory Visit

## 2015-09-14 DIAGNOSIS — Z1231 Encounter for screening mammogram for malignant neoplasm of breast: Secondary | ICD-10-CM

## 2015-12-24 ENCOUNTER — Other Ambulatory Visit: Payer: Self-pay | Admitting: Family Medicine

## 2015-12-24 DIAGNOSIS — R519 Headache, unspecified: Secondary | ICD-10-CM

## 2015-12-24 DIAGNOSIS — R51 Headache: Principal | ICD-10-CM

## 2015-12-29 ENCOUNTER — Ambulatory Visit
Admission: RE | Admit: 2015-12-29 | Discharge: 2015-12-29 | Disposition: A | Discharge status: Home / Self Care | Payer: Medicare Other | Source: Ambulatory Visit | Attending: Family Medicine | Admitting: Family Medicine

## 2015-12-29 DIAGNOSIS — R519 Headache, unspecified: Secondary | ICD-10-CM

## 2015-12-29 DIAGNOSIS — R51 Headache: Principal | ICD-10-CM

## 2016-01-04 ENCOUNTER — Other Ambulatory Visit: Payer: Self-pay | Admitting: Orthopedic Surgery

## 2016-01-04 DIAGNOSIS — M542 Cervicalgia: Secondary | ICD-10-CM

## 2016-01-05 ENCOUNTER — Ambulatory Visit
Admission: RE | Admit: 2016-01-05 | Discharge: 2016-01-05 | Disposition: A | Discharge status: Home / Self Care | Payer: Medicare Other | Source: Ambulatory Visit | Attending: Orthopedic Surgery | Admitting: Orthopedic Surgery

## 2016-01-05 DIAGNOSIS — M542 Cervicalgia: Secondary | ICD-10-CM

## 2016-02-09 ENCOUNTER — Other Ambulatory Visit: Payer: Self-pay | Admitting: Neurological Surgery

## 2016-02-14 ENCOUNTER — Encounter (HOSPITAL_COMMUNITY)
Admission: RE | Admit: 2016-02-14 | Discharge: 2016-02-14 | Disposition: A | Discharge status: Home / Self Care | Payer: Medicare Other | Source: Ambulatory Visit | Attending: Neurological Surgery | Admitting: Neurological Surgery

## 2016-02-14 ENCOUNTER — Encounter (HOSPITAL_COMMUNITY): Payer: Self-pay

## 2016-02-14 HISTORY — DX: Pure hypercholesterolemia, unspecified: E78.00

## 2016-02-14 HISTORY — DX: Obesity, unspecified: E66.9

## 2016-02-14 LAB — CBC
HCT: 42.4 % (ref 36.0–46.0)
Hemoglobin: 13.5 g/dL (ref 12.0–15.0)
MCH: 31 pg (ref 26.0–34.0)
MCHC: 31.8 g/dL (ref 30.0–36.0)
MCV: 97.2 fL (ref 78.0–100.0)
PLATELETS: 232 10*3/uL (ref 150–400)
RBC: 4.36 MIL/uL (ref 3.87–5.11)
RDW: 12.9 % (ref 11.5–15.5)
WBC: 7.6 10*3/uL (ref 4.0–10.5)

## 2016-02-14 LAB — BASIC METABOLIC PANEL
Anion gap: 7 (ref 5–15)
BUN: 12 mg/dL (ref 6–20)
CO2: 25 mmol/L (ref 22–32)
CREATININE: 0.99 mg/dL (ref 0.44–1.00)
Calcium: 9.8 mg/dL (ref 8.9–10.3)
Chloride: 106 mmol/L (ref 101–111)
GFR, EST NON AFRICAN AMERICAN: 57 mL/min — AB (ref >60)
Glucose, Bld: 74 mg/dL (ref 65–99)
Potassium: 3.6 mmol/L (ref 3.5–5.1)
SODIUM: 138 mmol/L (ref 135–145)

## 2016-02-14 LAB — SURGICAL PCR SCREEN
MRSA, PCR: NEGATIVE
STAPHYLOCOCCUS AUREUS: NEGATIVE

## 2016-02-14 NOTE — Pre-Procedure Instructions (Signed)
    Tracie Brose Dantuono  02/14/2016      [id number] - [address], [place] [address] Phone: [phone] Fax: [place], [place] [address] Phone: [phone] Fax: [phone]    Your procedure is scheduled on 02/15/16.  Report to [facility] Admitting at 1230 pm  Call this number if you have problems the morning of surgery:  [phone]   Remember:  Do not eat food or drink liquids after midnight.  Take these medicines the morning of surgery with A SIP OF WATER --celexa,allegra,hydrocodone,zantac   Do not wear jewelry, make-up or nail polish.  Do not wear lotions, powders, or perfumes, or deoderant.  Do not shave 48 hours prior to surgery.  Men may shave face and neck.  Do not bring valuables to the hospital.  [facility] is not responsible for any belongings or valuables.  Contacts, dentures or bridgework may not be worn into surgery.  Leave your suitcase in the car.  After surgery it may be brought to your room.  For patients admitted to the hospital, discharge time will be determined by your treatment team.  Patients discharged the day of surgery will not be allowed to drive home.   Name and phone number of your driver:    Special instructions:  Do not take any aspirin,anti-inflammatories,vitamins,or herbal supplements 5-7 days prior to surgery.  Please read over the following fact sheets that you were given. MRSA Information

## 2016-02-15 ENCOUNTER — Inpatient Hospital Stay (HOSPITAL_COMMUNITY): Payer: Medicare Other | Admitting: Certified Registered"

## 2016-02-15 ENCOUNTER — Inpatient Hospital Stay (HOSPITAL_COMMUNITY): Payer: Medicare Other

## 2016-02-15 ENCOUNTER — Inpatient Hospital Stay (HOSPITAL_COMMUNITY)
Admission: RE | Admit: 2016-02-15 | Discharge: 2016-02-17 | DRG: 472 | Disposition: A | Discharge status: Home / Self Care | Payer: Medicare Other | Source: Ambulatory Visit | Attending: Neurological Surgery | Admitting: Neurological Surgery

## 2016-02-15 ENCOUNTER — Encounter (HOSPITAL_COMMUNITY): Payer: Self-pay | Admitting: Surgery

## 2016-02-15 ENCOUNTER — Encounter (HOSPITAL_COMMUNITY)
Admission: RE | Disposition: A | Discharge status: Home / Self Care | Payer: Self-pay | Source: Ambulatory Visit | Attending: Neurological Surgery

## 2016-02-15 DIAGNOSIS — Z96651 Presence of right artificial knee joint: Secondary | ICD-10-CM | POA: Diagnosis present

## 2016-02-15 DIAGNOSIS — E669 Obesity, unspecified: Secondary | ICD-10-CM | POA: Diagnosis present

## 2016-02-15 DIAGNOSIS — K219 Gastro-esophageal reflux disease without esophagitis: Secondary | ICD-10-CM | POA: Diagnosis present

## 2016-02-15 DIAGNOSIS — M50022 Cervical disc disorder at C5-C6 level with myelopathy: Secondary | ICD-10-CM | POA: Diagnosis present

## 2016-02-15 DIAGNOSIS — M5001 Cervical disc disorder with myelopathy,  high cervical region: Secondary | ICD-10-CM | POA: Diagnosis present

## 2016-02-15 DIAGNOSIS — F988 Other specified behavioral and emotional disorders with onset usually occurring in childhood and adolescence: Secondary | ICD-10-CM | POA: Diagnosis present

## 2016-02-15 DIAGNOSIS — K2270 Barrett's esophagus without dysplasia: Secondary | ICD-10-CM | POA: Diagnosis present

## 2016-02-15 DIAGNOSIS — M50023 Cervical disc disorder at C6-C7 level with myelopathy: Secondary | ICD-10-CM | POA: Diagnosis present

## 2016-02-15 DIAGNOSIS — G959 Disease of spinal cord, unspecified: Secondary | ICD-10-CM | POA: Diagnosis present

## 2016-02-15 DIAGNOSIS — F419 Anxiety disorder, unspecified: Secondary | ICD-10-CM | POA: Diagnosis present

## 2016-02-15 DIAGNOSIS — Z79899 Other long term (current) drug therapy: Secondary | ICD-10-CM

## 2016-02-15 DIAGNOSIS — Z7982 Long term (current) use of aspirin: Secondary | ICD-10-CM

## 2016-02-15 DIAGNOSIS — E7800 Pure hypercholesterolemia, unspecified: Secondary | ICD-10-CM | POA: Diagnosis present

## 2016-02-15 DIAGNOSIS — G4730 Sleep apnea, unspecified: Secondary | ICD-10-CM | POA: Diagnosis present

## 2016-02-15 DIAGNOSIS — Z6830 Body mass index (BMI) 30.0-30.9, adult: Secondary | ICD-10-CM

## 2016-02-15 DIAGNOSIS — M542 Cervicalgia: Secondary | ICD-10-CM | POA: Diagnosis present

## 2016-02-15 DIAGNOSIS — M4712 Other spondylosis with myelopathy, cervical region: Secondary | ICD-10-CM | POA: Diagnosis present

## 2016-02-15 DIAGNOSIS — Z87891 Personal history of nicotine dependence: Secondary | ICD-10-CM | POA: Diagnosis not present

## 2016-02-15 DIAGNOSIS — M5412 Radiculopathy, cervical region: Secondary | ICD-10-CM | POA: Diagnosis present

## 2016-02-15 DIAGNOSIS — Z419 Encounter for procedure for purposes other than remedying health state, unspecified: Secondary | ICD-10-CM

## 2016-02-15 HISTORY — PX: ANTERIOR CERVICAL DECOMP/DISCECTOMY FUSION: SHX1161

## 2016-02-15 SURGERY — ANTERIOR CERVICAL DECOMPRESSION/DISCECTOMY FUSION 3 LEVELS
Anesthesia: General

## 2016-02-15 MED ORDER — EPHEDRINE SULFATE 50 MG/ML IJ SOLN
INTRAMUSCULAR | Status: DC | PRN
  Administered 2016-02-15 (×2): 10 mg via INTRAVENOUS
  Administered 2016-02-15: 5 mg via INTRAVENOUS

## 2016-02-15 MED ORDER — THROMBIN 20000 UNITS EX SOLR
CUTANEOUS | Status: DC | PRN
  Administered 2016-02-15: 20 mL via TOPICAL

## 2016-02-15 MED ORDER — ROCURONIUM BROMIDE 10 MG/ML (PF) SYRINGE
PREFILLED_SYRINGE | INTRAVENOUS | Status: AC
  Filled 2016-02-15: qty 10

## 2016-02-15 MED ORDER — DEXAMETHASONE SODIUM PHOSPHATE 10 MG/ML IJ SOLN
INTRAMUSCULAR | Status: DC | PRN
  Administered 2016-02-15: 10 mg via INTRAVENOUS

## 2016-02-15 MED ORDER — CEFAZOLIN SODIUM 1 G IJ SOLR
INTRAMUSCULAR | Status: DC | PRN
  Administered 2016-02-15: 2 g via INTRAMUSCULAR

## 2016-02-15 MED ORDER — OXYCODONE HCL 5 MG/5ML PO SOLN: 5.0000 mg | Freq: Once | ORAL | Status: DC | PRN

## 2016-02-15 MED ORDER — SODIUM CHLORIDE 0.9 % IV SOLN: 250.0000 mL | INTRAVENOUS | Status: DC

## 2016-02-15 MED ORDER — POLYETHYLENE GLYCOL 3350 17 G PO PACK: 17.0000 g | PACK | Freq: Every day | ORAL | Status: DC | PRN

## 2016-02-15 MED ORDER — ONDANSETRON HCL 4 MG/2ML IJ SOLN: 4.0000 mg | INTRAMUSCULAR | Status: DC | PRN

## 2016-02-15 MED ORDER — FAMOTIDINE 20 MG PO TABS
20.0000 mg | ORAL_TABLET | Freq: Every day | ORAL | Status: DC
  Administered 2016-02-15 – 2016-02-17 (×3): 20 mg via ORAL
  Filled 2016-02-15 (×3): qty 1

## 2016-02-15 MED ORDER — FENTANYL CITRATE (PF) 100 MCG/2ML IJ SOLN: 25.0000 ug | INTRAMUSCULAR | Status: DC | PRN

## 2016-02-15 MED ORDER — POLYVINYL ALCOHOL 1.4 % OP SOLN
1.0000 [drp] | OPHTHALMIC | Status: DC | PRN
  Filled 2016-02-15: qty 15

## 2016-02-15 MED ORDER — PROPYLENE GLYCOL 0.6 % OP SOLN: Freq: Every day | OPHTHALMIC | Status: DC

## 2016-02-15 MED ORDER — ACETAMINOPHEN 650 MG RE SUPP: 650.0000 mg | RECTAL | Status: DC | PRN

## 2016-02-15 MED ORDER — TIZANIDINE HCL 4 MG PO TABS: 2.0000 mg | ORAL_TABLET | Freq: Two times a day (BID) | ORAL | Status: DC | PRN

## 2016-02-15 MED ORDER — SUGAMMADEX SODIUM 500 MG/5ML IV SOLN
INTRAVENOUS | Status: AC
  Filled 2016-02-15: qty 5

## 2016-02-15 MED ORDER — DOCUSATE SODIUM 100 MG PO CAPS
100.0000 mg | ORAL_CAPSULE | Freq: Two times a day (BID) | ORAL | Status: DC
  Administered 2016-02-15 – 2016-02-17 (×4): 100 mg via ORAL
  Filled 2016-02-15 (×4): qty 1

## 2016-02-15 MED ORDER — METHYLPHENIDATE HCL 10 MG PO TABS: 10.0000 mg | ORAL_TABLET | Freq: Every day | ORAL | Status: DC | PRN

## 2016-02-15 MED ORDER — SODIUM CHLORIDE 0.9 % IR SOLN
Status: DC | PRN
  Administered 2016-02-15: 500 mL

## 2016-02-15 MED ORDER — MIDAZOLAM HCL 5 MG/5ML IJ SOLN
INTRAMUSCULAR | Status: DC | PRN
  Administered 2016-02-15: 2 mg via INTRAVENOUS

## 2016-02-15 MED ORDER — MELATONIN 3 MG PO TABS
9.0000 mg | ORAL_TABLET | Freq: Every day | ORAL | Status: DC
  Administered 2016-02-16: 9 mg via ORAL
  Filled 2016-02-15 (×3): qty 3

## 2016-02-15 MED ORDER — SALINE SPRAY 0.65 % NA SOLN
1.0000 | Freq: Every day | NASAL | Status: DC
  Filled 2016-02-15: qty 44

## 2016-02-15 MED ORDER — BISACODYL 10 MG RE SUPP: 10.0000 mg | Freq: Every day | RECTAL | Status: DC | PRN

## 2016-02-15 MED ORDER — METHOCARBAMOL 1000 MG/10ML IJ SOLN
500.0000 mg | Freq: Four times a day (QID) | INTRAVENOUS | Status: DC | PRN
  Filled 2016-02-15: qty 5

## 2016-02-15 MED ORDER — LIDOCAINE 2% (20 MG/ML) 5 ML SYRINGE
INTRAMUSCULAR | Status: AC
  Filled 2016-02-15: qty 5

## 2016-02-15 MED ORDER — ONDANSETRON HCL 4 MG/2ML IJ SOLN
INTRAMUSCULAR | Status: AC
  Filled 2016-02-15: qty 2

## 2016-02-15 MED ORDER — ROCURONIUM BROMIDE 100 MG/10ML IV SOLN
INTRAVENOUS | Status: DC | PRN
  Administered 2016-02-15: 60 mg via INTRAVENOUS
  Administered 2016-02-15: 40 mg via INTRAVENOUS

## 2016-02-15 MED ORDER — OXYCODONE-ACETAMINOPHEN 5-325 MG PO TABS
ORAL_TABLET | ORAL | Status: AC
  Administered 2016-02-15: 19:00:00
  Filled 2016-02-15: qty 2

## 2016-02-15 MED ORDER — CITALOPRAM HYDROBROMIDE 40 MG PO TABS
40.0000 mg | ORAL_TABLET | Freq: Every day | ORAL | Status: DC
  Administered 2016-02-16 – 2016-02-17 (×2): 40 mg via ORAL
  Filled 2016-02-15 (×2): qty 1

## 2016-02-15 MED ORDER — BUPIVACAINE HCL (PF) 0.25 % IJ SOLN
INTRAMUSCULAR | Status: DC | PRN
  Administered 2016-02-15: 3 mL

## 2016-02-15 MED ORDER — SUGAMMADEX SODIUM 200 MG/2ML IV SOLN
INTRAVENOUS | Status: DC | PRN
  Administered 2016-02-15: 175 mg via INTRAVENOUS

## 2016-02-15 MED ORDER — FENTANYL CITRATE (PF) 100 MCG/2ML IJ SOLN
25.0000 ug | INTRAMUSCULAR | Status: DC | PRN
  Administered 2016-02-15 (×2): 50 ug via INTRAVENOUS

## 2016-02-15 MED ORDER — LACTATED RINGERS IV SOLN
INTRAVENOUS | Status: DC
  Administered 2016-02-15 (×3): via INTRAVENOUS

## 2016-02-15 MED ORDER — PROPOFOL 10 MG/ML IV BOLUS
INTRAVENOUS | Status: AC
  Filled 2016-02-15: qty 20

## 2016-02-15 MED ORDER — DEXAMETHASONE SODIUM PHOSPHATE 10 MG/ML IJ SOLN
INTRAMUSCULAR | Status: AC
  Filled 2016-02-15: qty 1

## 2016-02-15 MED ORDER — FENTANYL CITRATE (PF) 100 MCG/2ML IJ SOLN
INTRAMUSCULAR | Status: AC
  Filled 2016-02-15: qty 4

## 2016-02-15 MED ORDER — ACETAMINOPHEN 325 MG PO TABS: 650.0000 mg | ORAL_TABLET | ORAL | Status: DC | PRN

## 2016-02-15 MED ORDER — OXYCODONE HCL 5 MG PO TABS: 5.0000 mg | ORAL_TABLET | Freq: Once | ORAL | Status: DC | PRN

## 2016-02-15 MED ORDER — SIMVASTATIN 20 MG PO TABS
20.0000 mg | ORAL_TABLET | Freq: Every day | ORAL | Status: DC
  Administered 2016-02-16 – 2016-02-17 (×2): 20 mg via ORAL
  Filled 2016-02-15 (×2): qty 1

## 2016-02-15 MED ORDER — FENTANYL CITRATE (PF) 100 MCG/2ML IJ SOLN
INTRAMUSCULAR | Status: AC
  Administered 2016-02-15: 19:00:00
  Filled 2016-02-15: qty 2

## 2016-02-15 MED ORDER — EPHEDRINE 5 MG/ML INJ
INTRAVENOUS | Status: AC
  Filled 2016-02-15: qty 10

## 2016-02-15 MED ORDER — MENTHOL 3 MG MT LOZG: 1.0000 | LOZENGE | OROMUCOSAL | Status: DC | PRN

## 2016-02-15 MED ORDER — ONDANSETRON HCL 4 MG/2ML IJ SOLN: 4.0000 mg | Freq: Once | INTRAMUSCULAR | Status: DC | PRN

## 2016-02-15 MED ORDER — SODIUM CHLORIDE 0.9% FLUSH: 3.0000 mL | Freq: Two times a day (BID) | INTRAVENOUS | Status: DC

## 2016-02-15 MED ORDER — SODIUM CHLORIDE 0.9% FLUSH: 3.0000 mL | INTRAVENOUS | Status: DC | PRN

## 2016-02-15 MED ORDER — HYDROCODONE-ACETAMINOPHEN 5-325 MG PO TABS: 1.0000 | ORAL_TABLET | Freq: Four times a day (QID) | ORAL | Status: DC | PRN

## 2016-02-15 MED ORDER — THROMBIN 5000 UNITS EX SOLR
OROMUCOSAL | Status: DC | PRN
  Administered 2016-02-15 (×2): via TOPICAL

## 2016-02-15 MED ORDER — MIDAZOLAM HCL 2 MG/2ML IJ SOLN
INTRAMUSCULAR | Status: AC
  Filled 2016-02-15: qty 2

## 2016-02-15 MED ORDER — MORPHINE SULFATE (PF) 2 MG/ML IV SOLN
1.0000 mg | INTRAVENOUS | Status: DC | PRN
  Administered 2016-02-15: 4 mg via INTRAVENOUS
  Filled 2016-02-15: qty 2

## 2016-02-15 MED ORDER — METHOCARBAMOL 500 MG PO TABS
500.0000 mg | ORAL_TABLET | Freq: Four times a day (QID) | ORAL | Status: DC | PRN
  Administered 2016-02-15 – 2016-02-17 (×5): 500 mg via ORAL
  Filled 2016-02-15 (×5): qty 1

## 2016-02-15 MED ORDER — SENNA 8.6 MG PO TABS
1.0000 | ORAL_TABLET | Freq: Two times a day (BID) | ORAL | Status: DC
  Administered 2016-02-15 – 2016-02-17 (×4): 8.6 mg via ORAL
  Filled 2016-02-15 (×4): qty 1

## 2016-02-15 MED ORDER — TRIAMTERENE-HCTZ 37.5-25 MG PO TABS
1.0000 | ORAL_TABLET | Freq: Every day | ORAL | Status: DC
  Administered 2016-02-16 – 2016-02-17 (×2): 1 via ORAL
  Filled 2016-02-15 (×3): qty 1

## 2016-02-15 MED ORDER — 0.9 % SODIUM CHLORIDE (POUR BTL) OPTIME
TOPICAL | Status: DC | PRN
  Administered 2016-02-15: 1000 mL

## 2016-02-15 MED ORDER — LORATADINE 10 MG PO TABS
10.0000 mg | ORAL_TABLET | Freq: Every day | ORAL | Status: DC
  Administered 2016-02-15 – 2016-02-17 (×3): 10 mg via ORAL
  Filled 2016-02-15 (×3): qty 1

## 2016-02-15 MED ORDER — PROPOFOL 10 MG/ML IV BOLUS
INTRAVENOUS | Status: DC | PRN
  Administered 2016-02-15: 190 mg via INTRAVENOUS

## 2016-02-15 MED ORDER — LIDOCAINE-EPINEPHRINE 1 %-1:100000 IJ SOLN
INTRAMUSCULAR | Status: DC | PRN
  Administered 2016-02-15: 3 mL

## 2016-02-15 MED ORDER — ALUM & MAG HYDROXIDE-SIMETH 200-200-20 MG/5ML PO SUSP: 30.0000 mL | Freq: Four times a day (QID) | ORAL | Status: DC | PRN

## 2016-02-15 MED ORDER — PHENOL 1.4 % MT LIQD: 1.0000 | OROMUCOSAL | Status: DC | PRN

## 2016-02-15 MED ORDER — OXYCODONE-ACETAMINOPHEN 5-325 MG PO TABS
1.0000 | ORAL_TABLET | ORAL | Status: DC | PRN
  Administered 2016-02-15 – 2016-02-17 (×7): 2 via ORAL
  Filled 2016-02-15 (×6): qty 2

## 2016-02-15 MED ORDER — FENTANYL CITRATE (PF) 100 MCG/2ML IJ SOLN
INTRAMUSCULAR | Status: AC
  Filled 2016-02-15: qty 2

## 2016-02-15 MED ORDER — ONDANSETRON HCL 4 MG/2ML IJ SOLN
INTRAMUSCULAR | Status: DC | PRN
  Administered 2016-02-15: 4 mg via INTRAVENOUS

## 2016-02-15 MED ORDER — POLYETHYL GLYCOL-PROPYL GLYCOL 0.4-0.3 % OP SOLN: Freq: Every day | OPHTHALMIC | Status: DC | PRN

## 2016-02-15 MED ORDER — FLUTICASONE PROPIONATE 50 MCG/ACT NA SUSP
2.0000 | Freq: Every day | NASAL | Status: DC
  Administered 2016-02-16: 2 via NASAL
  Filled 2016-02-15: qty 16

## 2016-02-15 MED ORDER — LIDOCAINE HCL (CARDIAC) 20 MG/ML IV SOLN
INTRAVENOUS | Status: DC | PRN
  Administered 2016-02-15: 60 mg via INTRAVENOUS

## 2016-02-15 MED ORDER — FENTANYL CITRATE (PF) 100 MCG/2ML IJ SOLN
INTRAMUSCULAR | Status: DC | PRN
  Administered 2016-02-15 (×8): 50 ug via INTRAVENOUS

## 2016-02-15 SURGICAL SUPPLY — 61 items
ADH SKN CLS APL DERMABOND .7 (GAUZE/BANDAGES/DRESSINGS) ×1
ALLOGRAFT LORDOTIC 8X11X14 (Bone Implant) ×2 IMPLANT
ALLOGRAFT TRIAD LORDOTIC CC (Bone Implant) ×1 IMPLANT
BAG DECANTER FOR FLEXI CONT (MISCELLANEOUS) ×2 IMPLANT
BIT DRILL NEURO 2X3.1 SFT TUCH (MISCELLANEOUS) ×1 IMPLANT
BIT DRILL POWER (BIT) IMPLANT
BNDG GAUZE ELAST 4 BULKY (GAUZE/BANDAGES/DRESSINGS) IMPLANT
BUR BARREL STRAIGHT FLUTE 4.0 (BURR) ×2 IMPLANT
CANISTER SUCT 3000ML PPV (MISCELLANEOUS) ×2 IMPLANT
DECANTER SPIKE VIAL GLASS SM (MISCELLANEOUS) ×2 IMPLANT
DERMABOND ADVANCED (GAUZE/BANDAGES/DRESSINGS) ×1
DERMABOND ADVANCED .7 DNX12 (GAUZE/BANDAGES/DRESSINGS) ×1 IMPLANT
DRAPE LAPAROTOMY 100X72 PEDS (DRAPES) ×2 IMPLANT
DRAPE MICROSCOPE LEICA (MISCELLANEOUS) IMPLANT
DRAPE POUCH INSTRU U-SHP 10X18 (DRAPES) ×2 IMPLANT
DRILL BIT POWER (BIT) ×2
DRILL NEURO 2X3.1 SOFT TOUCH (MISCELLANEOUS) ×2
DURAPREP 6ML APPLICATOR 50/CS (WOUND CARE) ×2 IMPLANT
ELECT REM PT RETURN 9FT ADLT (ELECTROSURGICAL) ×2
ELECTRODE REM PT RTRN 9FT ADLT (ELECTROSURGICAL) ×1 IMPLANT
GAUZE SPONGE 4X4 16PLY XRAY LF (GAUZE/BANDAGES/DRESSINGS) IMPLANT
GLOVE BIO SURGEON STRL SZ7.5 (GLOVE) IMPLANT
GLOVE BIOGEL PI IND STRL 7.5 (GLOVE) IMPLANT
GLOVE BIOGEL PI IND STRL 8.5 (GLOVE) ×1 IMPLANT
GLOVE BIOGEL PI INDICATOR 7.5 (GLOVE)
GLOVE BIOGEL PI INDICATOR 8.5 (GLOVE) ×1
GLOVE ECLIPSE 8.5 STRL (GLOVE) ×2 IMPLANT
GLOVE EXAM NITRILE LRG STRL (GLOVE) IMPLANT
GLOVE EXAM NITRILE XL STR (GLOVE) IMPLANT
GLOVE EXAM NITRILE XS STR PU (GLOVE) IMPLANT
GLOVE INDICATOR 7.5 STRL GRN (GLOVE) ×1 IMPLANT
GLOVE SS BIOGEL STRL SZ 7 (GLOVE) IMPLANT
GLOVE SUPERSENSE BIOGEL SZ 7 (GLOVE) ×1
GOWN STRL REUS W/ TWL LRG LVL3 (GOWN DISPOSABLE) IMPLANT
GOWN STRL REUS W/ TWL XL LVL3 (GOWN DISPOSABLE) ×1 IMPLANT
GOWN STRL REUS W/TWL 2XL LVL3 (GOWN DISPOSABLE) ×2 IMPLANT
GOWN STRL REUS W/TWL LRG LVL3 (GOWN DISPOSABLE) ×2
GOWN STRL REUS W/TWL XL LVL3 (GOWN DISPOSABLE) ×2
HALTER HD/CHIN CERV TRACTION D (MISCELLANEOUS) ×2 IMPLANT
HEMOSTAT POWDER KIT SURGIFOAM (HEMOSTASIS) ×3 IMPLANT
KIT BASIN OR (CUSTOM PROCEDURE TRAY) ×2 IMPLANT
KIT ROOM TURNOVER OR (KITS) ×2 IMPLANT
NDL SPNL 22GX3.5 QUINCKE BK (NEEDLE) ×1 IMPLANT
NEEDLE HYPO 22GX1.5 SAFETY (NEEDLE) ×2 IMPLANT
NEEDLE SPNL 22GX3.5 QUINCKE BK (NEEDLE) ×2 IMPLANT
NS IRRIG 1000ML POUR BTL (IV SOLUTION) ×2 IMPLANT
PACK LAMINECTOMY NEURO (CUSTOM PROCEDURE TRAY) ×2 IMPLANT
PAD ARMBOARD 7.5X6 YLW CONV (MISCELLANEOUS) ×6 IMPLANT
PATTIES SURGICAL 1X1 (DISPOSABLE) ×1 IMPLANT
PIN ARCHON ACP FIXATION TEMP (EXFIX) ×2
PIN FXSTD TEMP SPNE ARCHON (EXFIX) IMPLANT
PLATE ARCHON 1-LEVEL 22MM (Plate) ×1 IMPLANT
PLATE ARCHON 2-LEVEL 46MM (Plate) ×1 IMPLANT
RUBBERBAND STERILE (MISCELLANEOUS) IMPLANT
SCREW ARCHON ST VAR 4.0X15MM (Screw) ×20 IMPLANT
SCREW BN 15X4XST VA NS SPN (Screw) IMPLANT
SPONGE INTESTINAL PEANUT (DISPOSABLE) ×3 IMPLANT
SUT VIC AB 3-0 SH 8-18 (SUTURE) ×4 IMPLANT
TOWEL OR 17X24 6PK STRL BLUE (TOWEL DISPOSABLE) ×2 IMPLANT
TOWEL OR 17X26 10 PK STRL BLUE (TOWEL DISPOSABLE) ×2 IMPLANT
WATER STERILE IRR 1000ML POUR (IV SOLUTION) ×2 IMPLANT

## 2016-02-15 NOTE — Op Note (Signed)
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 Postoperative diagnosis: Same Procedure: Anterior cervical decompression C3-4, C5-6, C6-7. Anterior interbody arthrodesis with structural allograft. Anterior plate fixation 075-GRM and C5-C7 using nuvasive plates. Surgeon: Kristeen Miss M.D. First assistant: Cyndy Freeze M.D. Anesthesia: Gen. endotracheal Indications: [name] is a 70 year old individual who's had progressively worsening neck pain shoulder pain with weakness in her arms and hands and some difficulty with her gait. She has evidence of advanced spondylitic changes with central disc protrusion at C3-4 C5-6 and C6-C7. She's been advised regarding the need for surgical decompression and arthrodesis of these 3 levels.  Procedure: The patient was brought to the operating room supine on a stretcher. After the smooth induction of general endotracheal anesthesia her neck was prepped with alcohol and DuraPrep and draped in a sterile fashion. A transverse incision was made in the lower midportion of the neck. The dissection was carried down to the platysma and the plane between the sternocleidomastoid and strap muscles dissected bluntly. The first identifiable disc space was noted be that of C4-C5 on the radiograph. Then by dissecting inferiorly C5-6 was isolated. Large ventral osteophytes were removed with a Leksell rongeur and high-speed drill and osteophytectomy tools. The disc space was entered. The disc was noted be severely degenerated and desiccated. The endplates were drilled smoothed. The region of the posterior longitudinal ligament was reached. Ligament was lifted and dissected free and clear on either side. The exiting nerve roots were decompressed individually on the right and on the left. Hemostasis was obtained in the epidural space from around the dura and the nerve roots. When good decompression was  obtained the interspace was sized and it was felt that an 8 mm lordotic allograft would fit best. This was placed into the interspace. Attention was then turned to C6-C7 were similar dissection was carried out in a similar size graft was required to fill the interspace adequately. Then the retractors were removed from the lower portion of the incision and dissection was carried out superiorly to expose C3-C4. Caspar type retractors were again placed here in the disc space was identified and isolated. He was entered after removing some ventral osteophytosis. The disc space was evacuated of a significant quantity of moderately severely degenerated disc material. As region of the posterior longitudinal ligament was reached was identified to be subligamentous disc herniation in this area. This was taken up with a 2 mm Kerrison punch. The ligament was ultimately opened from side side in the lateral recesses were decompressed. The endplates were decompressed. The stasis was achieved in the epidural spaces and the interspace was then again sized for appropriate size spacer. Is felt that a 6 mm tall lordotic spacer would fit best into this interval. This was placed into the interspace. Traction was removed. A 22 mm plate was then fitted to the ventral aspect of the vertebral bodies at the C3 C4 level and secured with 15 mm locking screws. At C5-C7 a 46 mm plate was placed and secured with 6:15 millimeters screws. A final radiograph was obtained demonstrating good placement of the hardware. Is not able to be seen fully down below the level of C5. The wound was carefully irrigated with antibiotic irrigating solution hemostasis was meticulously obtained and then the platysma was closed with 3-0 Vicryl interrupted fashion and 3-0 Vicryl was used in the subcuticular tissues. Blood loss for the procedure was estimated at approximately 100 mL.

## 2016-02-15 NOTE — Progress Notes (Signed)
Patient ID: [name], female   DOB: 1946/06/07, 69 y.o.   MRN: [id number] Vital signs are stable Motor function appears good Incision is clean and dry Patient feels comfortable

## 2016-02-15 NOTE — Anesthesia Preprocedure Evaluation (Addendum)
Anesthesia Evaluation  Patient identified by MRN, date of birth, ID band Patient awake    Reviewed: Allergy & Precautions, NPO status , Patient's Chart, lab work & pertinent test results  Airway Mallampati: II  TM Distance: >3 FB Neck ROM: Full    Dental  (+) Teeth Intact, Dental Advisory Given   Pulmonary former smoker,    breath sounds clear to auscultation       Cardiovascular  Rhythm:Regular Rate:Normal     Neuro/Psych    GI/Hepatic   Endo/Other    Renal/GU      Musculoskeletal   Abdominal (+) + obese,   Peds  Hematology   Anesthesia Other Findings   Reproductive/Obstetrics                            Anesthesia Physical Anesthesia Plan  ASA: III  Anesthesia Plan: General   Post-op Pain Management:    Induction: Intravenous  Airway Management Planned: Oral ETT  Additional Equipment:   Intra-op Plan:   Post-operative Plan: Extubation in OR  Informed Consent: I have reviewed the patients History and Physical, chart, labs and discussed the procedure including the risks, benefits and alternatives for the proposed anesthesia with the patient or authorized representative who has indicated his/her understanding and acceptance.   Dental advisory given  Plan Discussed with: CRNA and Anesthesiologist  Anesthesia Plan Comments:        Anesthesia Quick Evaluation

## 2016-02-15 NOTE — H&P (Signed)
[name] is an 70 y.o. female.   Chief Complaint: Headaches with neck shoulder arm pain and weakness in the upper extremities. HPI: Patient is a 70 year old individual is had significant problems with headaches and neck pain and she was seen and evaluated by Dr. Flossie Dibble for this problem. In mid July she underwent an MRI of the cervical spine as she is not getting better with conservative efforts and was noted that she had evidence of cord compression at C3-4 and severe spondylitic narrowing at C5-6 and C6-C7. C4-5 appeared relatively spared. After careful consideration of her options I discussed that she would be a candidate for three-level anterior decompression arthrodesis. She is now admitted for this procedure.  Past Medical History:  Diagnosis Date  . ADD (attention deficit disorder)   . Anxiety   . Arthritis    os  . GERD (gastroesophageal reflux disease)    barretts  . High cholesterol   . Obesity   . Sleep apnea    cpap   last sleep study> 3 yrs  . Swelling of both ankles     Past Surgical History:  Procedure Laterality Date  . ABDOMINAL HYSTERECTOMY    . bicept surgery    . BREAST LUMPECTOMY    . CHOLECYSTECTOMY    . JOINT REPLACEMENT    . PAROTID GLAND TUMOR EXCISION    . TONSILLECTOMY    . TOTAL KNEE ARTHROPLASTY  07/19/2012   Procedure: TOTAL KNEE ARTHROPLASTY;  Surgeon: Yvette Rack., MD;  Location: [facility];  Service: Orthopedics;  Laterality: Right;  right total knee arthroplasty  . TUBAL LIGATION      History reviewed. No pertinent family history. Social History:  reports that she quit smoking about 18 years ago. She does not have any smokeless tobacco history on file. She reports that she drinks alcohol. She reports that she does not use drugs.  Allergies:  Allergies  Allergen Reactions  . Nickel Hives and Rash    Medications Prior to Admission  Medication Sig Dispense Refill  . aspirin EC 81 MG tablet Take 81 mg by mouth at bedtime.    Marland Kitchen b  complex vitamins capsule Take 1 capsule by mouth daily.    . Cholecalciferol (VITAMIN D3) 5000 units CAPS Take 10,000 Units by mouth daily.    . citalopram (CELEXA) 40 MG tablet Take 40 mg by mouth daily.      . Eyelid Cleansers (OCUSOFT LID SCRUB EX) Apply 1 application topically daily as needed (dry eyes).    . fexofenadine (ALLEGRA) 180 MG tablet Take 180 mg by mouth at bedtime.     . fluticasone (FLONASE) 50 MCG/ACT nasal spray Place 2 sprays into the nose daily.      Marland Kitchen HYDROcodone-acetaminophen (NORCO/VICODIN) 5-325 MG tablet Take 1 tablet by mouth every 6 (six) hours as needed for moderate pain.    . Melatonin 10 MG TABS Take 10 mg by mouth at bedtime.    . meloxicam (MOBIC) 15 MG tablet Take 15 mg by mouth daily.    Vladimir Faster Glycol-Propyl Glycol (SYSTANE ULTRA OP) Apply 1 drop to eye daily as needed (dry eyes).    . Propylene Glycol (SYSTANE BALANCE OP) Apply 1 drop to eye at bedtime.    . ranitidine (ZANTAC) 150 MG tablet Take 150 mg by mouth daily.    . simvastatin (ZOCOR) 20 MG tablet Take 20 mg by mouth daily.     . sodium chloride (OCEAN) 0.65 % SOLN nasal spray  Place 1 spray into both nostrils at bedtime.    Marland Kitchen tiZANidine (ZANAFLEX) 2 MG tablet Take 2 mg by mouth 2 (two) times daily as needed for muscle spasms.   0  . triamterene-hydrochlorothiazide (MAXZIDE-25) 37.5-25 MG tablet Take 1 tablet by mouth daily.  0  . methylphenidate (RITALIN) 10 MG tablet Take 10 mg by mouth daily as needed (focus).       Results for orders placed or performed during the hospital encounter of 02/14/16 (from the past 48 hour(s))  CBC     Status: None   Collection Time: 02/14/16  2:31 PM  Result Value Ref Range   WBC 7.6 4.0 - 10.5 K/uL   RBC 4.36 3.87 - 5.11 MIL/uL   Hemoglobin 13.5 12.0 - 15.0 g/dL   HCT 42.4 36.0 - 46.0 %   MCV 97.2 78.0 - 100.0 fL   MCH 31.0 26.0 - 34.0 pg   MCHC 31.8 30.0 - 36.0 g/dL   RDW 12.9 11.5 - 15.5 %   Platelets 232 150 - 400 K/uL  Basic metabolic panel      Status: Abnormal   Collection Time: 02/14/16  2:31 PM  Result Value Ref Range   Sodium 138 135 - 145 mmol/L   Potassium 3.6 3.5 - 5.1 mmol/L   Chloride 106 101 - 111 mmol/L   CO2 25 22 - 32 mmol/L   Glucose, Bld 74 65 - 99 mg/dL   BUN 12 6 - 20 mg/dL   Creatinine, Ser 0.99 0.44 - 1.00 mg/dL   Calcium 9.8 8.9 - 10.3 mg/dL   GFR calc non Af Amer 57 (L) >60 mL/min   GFR calc Af Amer >60 >60 mL/min    Comment: (NOTE) The eGFR has been calculated using the CKD EPI equation. This calculation has not been validated in all clinical situations. eGFR's persistently <60 mL/min signify possible Chronic Kidney Disease.    Anion gap 7 5 - 15  Surgical pcr screen     Status: None   Collection Time: 02/14/16  2:37 PM  Result Value Ref Range   MRSA, PCR NEGATIVE NEGATIVE   Staphylococcus aureus NEGATIVE NEGATIVE    Comment:        The Xpert SA Assay (FDA approved for NASAL specimens in patients over 90 years of age), is one component of a comprehensive surveillance program.  Test performance has been validated by [facility] for patients greater than or equal to 84 year old. It is not intended to diagnose infection nor to guide or monitor treatment.    No results found.  Review of Systems  Eyes: Negative.   Respiratory: Negative.   Cardiovascular: Negative.   Gastrointestinal: Negative.   Genitourinary: Negative.   Musculoskeletal: Positive for neck pain.  Neurological: Positive for tingling, sensory change, weakness and headaches.  Endo/Heme/Allergies: Negative.   Psychiatric/Behavioral: Negative.     Blood pressure 124/60, pulse (!) 57, temperature 97.9 F (36.6 C), temperature source Oral, resp. rate 20, height 5' 4"  (1.626 m), weight 81.2 kg (179 lb), SpO2 97 %. Physical Exam  Constitutional: She is oriented to person, place, and time. She appears well-developed and well-nourished.  HENT:  Head: Normocephalic and atraumatic.  Eyes: Conjunctivae and EOM are normal. Pupils  are equal, round, and reactive to light.  Neck: Normal range of motion. Neck supple.  Cardiovascular: Normal rate and regular rhythm.   Respiratory: Effort normal and breath sounds normal.  GI: Soft. Bowel sounds are normal.  Musculoskeletal: Normal range of motion.  Neurological: She is alert and oriented to person, place, and time.  Mild weakness in the deltoids. Grip strength is 4-5 bilaterally. No evidence of atrophy. 10 reflexes in upper extremities are depressed in the biceps and triceps 1+ in the patellae trace in the Achilles. Babinski reflexes are downgoing. Asian gait are intact.  Skin: Skin is warm and dry.  Psychiatric: She has a normal mood and affect. Her behavior is normal. Judgment and thought content normal.     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  Earleen Newport, MD 02/15/2016, 3:04 PM

## 2016-02-15 NOTE — Anesthesia Procedure Notes (Signed)
Procedure Name: Intubation Date/Time: 02/15/2016 3:42 PM Performed by: Lance Coon Pre-anesthesia Checklist: Patient identified, Emergency Drugs available, Suction available, Patient being monitored and Timeout performed Patient Re-evaluated:Patient Re-evaluated prior to inductionOxygen Delivery Method: Circle system utilized Preoxygenation: Pre-oxygenation with 100% oxygen Intubation Type: IV induction Ventilation: Oral airway inserted - appropriate to patient size and Two handed mask ventilation required Laryngoscope Size: Miller and 2 Grade View: Grade II Tube type: Oral Tube size: 7.0 mm Number of attempts: 1 Airway Equipment and Method: Stylet Placement Confirmation: ETT inserted through vocal cords under direct vision,  positive ETCO2 and breath sounds checked- equal and bilateral Secured at: 21 cm Tube secured with: Tape Dental Injury: Teeth and Oropharynx as per pre-operative assessment

## 2016-02-15 NOTE — Transfer of Care (Signed)
Immediate Anesthesia Transfer of Care Note  Patient: [name]  Procedure(s) Performed: Procedure(s): Cervical three four-Cervical five-six, Cervical six-seven Anterior cervical decompression/diskectomy/fusion (N/A)  Patient Location: PACU  Anesthesia Type:General  Level of Consciousness: awake, alert , oriented and patient cooperative  Airway & Oxygen Therapy: Patient Spontanous Breathing and Patient connected to face mask oxygen  Post-op Assessment: Report given to RN, Post -op Vital signs reviewed and stable and Patient moving all extremities X 4  Post vital signs: Reviewed and stable  Last Vitals:  Vitals:   02/15/16 1301  BP: 124/60  Pulse: (!) 57  Resp: 20  Temp: 36.6 C    Last Pain:  Vitals:   02/15/16 1301  TempSrc: Oral  PainSc: 3       Patients Stated Pain Goal: 3 ([id number])  Complications: No apparent anesthesia complications

## 2016-02-15 NOTE — Progress Notes (Signed)
In good spiirits, joking with staff.Marland KitchenMarland Kitchen"Put my head in a basket or a wagon and let me pull it along with me. IOt feels like its going to fall off."

## 2016-02-15 NOTE — Anesthesia Postprocedure Evaluation (Signed)
Anesthesia Post Note  Patient: [name]  Procedure(s) Performed: Procedure(s) (LRB): Cervical three four-Cervical five-six, Cervical six-seven Anterior cervical decompression/diskectomy/fusion (N/A)  Patient location during evaluation: PACU Anesthesia Type: General Level of consciousness: awake Pain management: pain level controlled Vital Signs Assessment: post-procedure vital signs reviewed and stable Respiratory status: spontaneous breathing Cardiovascular status: stable Anesthetic complications: no    Last Vitals:  Vitals:   02/15/16 1915 02/15/16 1935  BP:  (!) 151/75  Pulse: 78 81  Resp: 18 20  Temp:  36.9 C    Last Pain:  Vitals:   02/15/16 2006  TempSrc:   PainSc: 8                  EDWARDS,Helmer Dull

## 2016-02-16 ENCOUNTER — Encounter (HOSPITAL_COMMUNITY): Payer: Self-pay | Admitting: Neurological Surgery

## 2016-02-16 DIAGNOSIS — M4712 Other spondylosis with myelopathy, cervical region: Secondary | ICD-10-CM | POA: Diagnosis present

## 2016-02-16 DIAGNOSIS — G4730 Sleep apnea, unspecified: Secondary | ICD-10-CM | POA: Diagnosis present

## 2016-02-16 DIAGNOSIS — Z96651 Presence of right artificial knee joint: Secondary | ICD-10-CM | POA: Diagnosis present

## 2016-02-16 DIAGNOSIS — F988 Other specified behavioral and emotional disorders with onset usually occurring in childhood and adolescence: Secondary | ICD-10-CM | POA: Diagnosis present

## 2016-02-16 DIAGNOSIS — K219 Gastro-esophageal reflux disease without esophagitis: Secondary | ICD-10-CM | POA: Diagnosis present

## 2016-02-16 DIAGNOSIS — Z6830 Body mass index (BMI) 30.0-30.9, adult: Secondary | ICD-10-CM | POA: Diagnosis not present

## 2016-02-16 DIAGNOSIS — M5001 Cervical disc disorder with myelopathy,  high cervical region: Secondary | ICD-10-CM | POA: Diagnosis present

## 2016-02-16 DIAGNOSIS — Z87891 Personal history of nicotine dependence: Secondary | ICD-10-CM | POA: Diagnosis not present

## 2016-02-16 DIAGNOSIS — M50022 Cervical disc disorder at C5-C6 level with myelopathy: Secondary | ICD-10-CM | POA: Diagnosis present

## 2016-02-16 DIAGNOSIS — K2270 Barrett's esophagus without dysplasia: Secondary | ICD-10-CM | POA: Diagnosis present

## 2016-02-16 DIAGNOSIS — E7800 Pure hypercholesterolemia, unspecified: Secondary | ICD-10-CM | POA: Diagnosis present

## 2016-02-16 DIAGNOSIS — M50023 Cervical disc disorder at C6-C7 level with myelopathy: Secondary | ICD-10-CM | POA: Diagnosis present

## 2016-02-16 DIAGNOSIS — Z79899 Other long term (current) drug therapy: Secondary | ICD-10-CM | POA: Diagnosis not present

## 2016-02-16 DIAGNOSIS — Z7982 Long term (current) use of aspirin: Secondary | ICD-10-CM | POA: Diagnosis not present

## 2016-02-16 DIAGNOSIS — M542 Cervicalgia: Secondary | ICD-10-CM | POA: Diagnosis present

## 2016-02-16 DIAGNOSIS — F419 Anxiety disorder, unspecified: Secondary | ICD-10-CM | POA: Diagnosis present

## 2016-02-16 DIAGNOSIS — E669 Obesity, unspecified: Secondary | ICD-10-CM | POA: Diagnosis present

## 2016-02-16 MED ORDER — DEXAMETHASONE 4 MG PO TABS
4.0000 mg | ORAL_TABLET | Freq: Every day | ORAL | Status: DC
  Administered 2016-02-16 – 2016-02-17 (×2): 4 mg via ORAL
  Filled 2016-02-16 (×2): qty 1

## 2016-02-16 NOTE — Evaluation (Signed)
Physical Therapy Evaluation Patient Details Name: [name] MRN: [id number] DOB: 04/07/1946 Today's Date: 02/16/2016   History of Present Illness  70 y/o female s/p C3-7 ACDF (02/15/16)  PMH: ADD, anxiety, GERD, OSA, back pain  Clinical Impression  Pt admitted with above diagnosis. Pt currently with functional limitations due to the deficits listed below (see PT Problem List). Pt will benefit from skilled PT to increase their independence and safety with mobility to allow discharge to the venue listed below.  Pt's mobility limited more by back pain (8/10) than neck pain (3/10).  Discussed use of use of RW to A with back pain and decrease fall risk.        Follow Up Recommendations No PT follow up;Supervision - Intermittent    Equipment Recommendations  None recommended by PT    Recommendations for Other Services       Precautions / Restrictions Precautions Precautions: Cervical Precaution Comments: Issued illustrated precaution handout Restrictions Weight Bearing Restrictions: No      Mobility  Bed Mobility               General bed mobility comments: sitting EOB, got up with OT  Transfers Overall transfer level: Needs assistance Equipment used: Rolling walker (2 wheeled);None Transfers: Sit to/from Stand Sit to Stand: Min guard         General transfer comment: cues for scooting hips forward to edge and for proper hand placement  Ambulation/Gait Ambulation/Gait assistance: Min guard Ambulation Distance (Feet): 420 Feet Assistive device: Rolling walker (2 wheeled);None Gait Pattern/deviations: Step-through pattern;Decreased step length - right;Decreased step length - left;Trunk flexed Gait velocity: decreased   General Gait Details: Amb with RW for ~ 3/4 of gait with RW and last 1/4 without AD.  Increased back pain without use of RW and needing to hold onto rail for increased support.  Cues not to turn neck during gait when talking.  Stairs            Wheelchair Mobility    Modified Rankin (Stroke Patients Only)       Balance Overall balance assessment: History of Falls (2 falls in same night a few weeks ago)                                           Pertinent Vitals/Pain Pain Assessment: 0-10 Pain Score: 3  Pain Location: neck and 8/10 in back with gait without RW Pain Descriptors / Indicators: Aching;Sore Pain Intervention(s): Monitored during session    Home Living Family/patient expects to be discharged to:: Private residence Living Arrangements: Spouse/significant other     Home Access: Stairs to enter   Technical brewer of Steps: 1 (small threshold type step) Home Layout: One level Home Equipment: Walker - 4 wheels      Prior Function Level of Independence: Independent;Independent with assistive device(s)         Comments: At home, amb without AD, but tends to furniture walk.  Uses rollator for longer community distances     Hand Dominance   Dominant Hand: Right    Extremity/Trunk Assessment   Upper Extremity Assessment: Defer to OT evaluation           Lower Extremity Assessment: Overall WFL for tasks assessed;Generalized weakness         Communication   Communication: No difficulties  Cognition Arousal/Alertness: Awake/alert Behavior During Therapy: WFL for tasks assessed/performed  Overall Cognitive Status: Within Functional Limits for tasks assessed                      General Comments      Exercises        Assessment/Plan    PT Assessment Patient needs continued PT services  PT Diagnosis Difficulty walking   PT Problem List Decreased activity tolerance;Decreased balance;Decreased knowledge of precautions;Decreased knowledge of use of DME;Decreased mobility  PT Treatment Interventions DME instruction;Gait training;Stair training;Functional mobility training;Therapeutic activities;Therapeutic exercise;Balance training;Patient/family  education   PT Goals (Current goals can be found in the Care Plan section) Acute Rehab PT Goals Patient Stated Goal: Get neck better so she can get back surgery PT Goal Formulation: With patient Time For Goal Achievement: 02/19/16 Potential to Achieve Goals: Good    Frequency Min 5X/week   Barriers to discharge        Co-evaluation               End of Session Equipment Utilized During Treatment: Gait belt Activity Tolerance: Patient tolerated treatment well;Patient limited by pain (back pain > neck pain) Patient left: in chair;with call bell/phone within reach Nurse Communication: Mobility status    Functional Assessment Tool Used: objective findings and clinical judgement Functional Limitation: Mobility: Walking and moving around Mobility: Walking and Moving Around Current Status [id number]): At least 1 percent but less than 20 percent impaired, limited or restricted Mobility: Walking and Moving Around Goal Status [phone]): At least 1 percent but less than 20 percent impaired, limited or restricted    Time: 1040-1100 PT Time Calculation (min) (ACUTE ONLY): 20 min   Charges:   PT Evaluation $PT Eval Low Complexity: 1 Procedure     PT G Codes:   PT G-Codes **NOT FOR INPATIENT CLASS** Functional Assessment Tool Used: objective findings and clinical judgement Functional Limitation: Mobility: Walking and moving around Mobility: Walking and Moving Around Current Status [id number]): At least 1 percent but less than 20 percent impaired, limited or restricted Mobility: Walking and Moving Around Goal Status [phone]): At least 1 percent but less than 20 percent impaired, limited or restricted    [facility] LUBECK 02/16/2016, 12:48 PM

## 2016-02-16 NOTE — Progress Notes (Signed)
Patient ID: [name], female   DOB: 1946/05/09, 70 y.o.   MRN: [id number] Vital signs are stable Patient appears to be doing well Motor function is good Voice is somewhat hoarse She wishes to stay another day I agree is necessary because of her morbid obesity and slow movement and instability. Hopeful for discharge tomorrow.

## 2016-02-16 NOTE — Evaluation (Signed)
Occupational Therapy Evaluation Patient Details Name: [name] MRN: [id number] DOB: 1945-12-01 Today's Date: 02/16/2016    History of Present Illness 70 y/o female s/p C3-7 ACDF (02/15/16)  PMH: ADD, anxiety, GERD, OSA, back pain   Clinical Impression   Patient is s/p ACDF C3-7  surgery resulting in functional limitations due to the deficits listed below (see OT problem list). PTA mod I and used a riding cart at [employer] for balance per patient.  Patient will benefit from skilled OT acutely to increase independence and safety with ADLS to allow discharge home with 3n1.     Follow Up Recommendations  No OT follow up    Equipment Recommendations  3 in 1 bedside comode    Recommendations for Other Services       Precautions / Restrictions Precautions Precautions: Cervical Precaution Comments: educated on cervical precautions and adls Restrictions Weight Bearing Restrictions: No      Mobility Bed Mobility Overal bed mobility: Needs Assistance Bed Mobility: Rolling;Supine to Sit Rolling: Min guard   Supine to sit: Min guard     General bed mobility comments: Pt guarded to ensure not rolling off bed surface. Pt reaching for environmental supports. Pt educated on sequence and use of UE to push up  Transfers Overall transfer level: Needs assistance Equipment used: Rolling walker (2 wheeled) Transfers: Sit to/from Stand Sit to Stand: Min guard         General transfer comment: needed instructions on hand placement and not to pull On RW    Balance Overall balance assessment: History of Falls (x2 falls in one day last week)                                          ADL Overall ADL's : Needs assistance/impaired     Grooming: Wash/dry hands;Min guard   Upper Body Bathing: Min guard   Lower Body Bathing: Maximal assistance       Lower Body Dressing: Maximal assistance Lower Body Dressing Details (indicate cue type and reason): pt able  to don shoes that are slip on MOD I. pt will have spouse (A).  Toilet Transfer: Magazine features editor Details (indicate cue type and reason): pt reports fall in bathroom prior to admission. recommending 3n1 to help with hand placement. Pt reports that once hip flexion starts voiding bladder is immediate and descend to toilet is fast and uncontrolled. Pt reports having somewhere to put her hands would help greatly Toileting- Clothing Manipulation and Hygiene: Maximal assistance Toileting - Clothing Manipulation Details (indicate cue type and reason): educated on toilet aide. Pt plans to have spouse purchase      Functional mobility during ADLs: Min guard General ADL Comments: pt educated on bed mobility sequence and excellent return demo. pt has x3 dogs and x2 cats at home. pt advised animals should not sleep in the bed with her during recovery     Vision     Perception     Praxis      Pertinent Vitals/Pain Pain Assessment: Faces Pain Score: 3  Faces Pain Scale: Hurts little more Pain Location: neck Pain Descriptors / Indicators: Operative site guarding Pain Intervention(s): Monitored during session;Premedicated before session;Repositioned     Hand Dominance Right   Extremity/Trunk Assessment Upper Extremity Assessment Upper Extremity Assessment: Overall WFL for tasks assessed;RUE deficits/detail RUE Deficits / Details: hx of 20 pound weight lifiting restriction  due to bicep tear per patient   Lower Extremity Assessment Lower Extremity Assessment: Defer to PT evaluation   Cervical / Trunk Assessment Cervical / Trunk Assessment: Other exceptions Cervical / Trunk Exceptions: s/p surg   Communication Communication Communication: No difficulties   Cognition Arousal/Alertness: Awake/alert Behavior During Therapy: WFL for tasks assessed/performed Overall Cognitive Status: Within Functional Limits for tasks assessed                     General Comments        Exercises       Shoulder Instructions      Home Living Family/patient expects to be discharged to:: Private residence Living Arrangements: Spouse/significant other Available Help at Discharge: Family Type of Home: House Home Access: Stairs to enter Technical brewer of Steps: 1   Home Layout: One level     Bathroom Shower/Tub: Teacher, early years/pre: Standard     Home Equipment: Environmental consultant - 4 wheels;Shower seat;Adaptive equipment;Hand held shower head Adaptive Equipment: Reacher Additional Comments: spouse will be home in the evenings to (A)      Prior Functioning/Environment Level of Independence: Independent with assistive device(s)        Comments: At home, amb without AD, but tends to furniture walk.  Uses rollator for longer community distances    OT Diagnosis: Generalized weakness;Acute pain   OT Problem List:     OT Treatment/Interventions:      OT Goals(Current goals can be found in the care plan section) Acute Rehab OT Goals Patient Stated Goal: to have back surgery next OT Goal Formulation: With patient Time For Goal Achievement: 02/23/16 Potential to Achieve Goals: Good  OT Frequency:     Barriers to D/C:            Co-evaluation              End of Session Equipment Utilized During Treatment: Gait belt;Rolling walker Nurse Communication: Mobility status;Precautions  Activity Tolerance: Patient tolerated treatment well Patient left: Other (comment) (PT Santiago Glad present to address needs)   Time: 1016-1040 OT Time Calculation (min): 24 min Charges:  OT General Charges $OT Visit: 1 Procedure OT Evaluation $OT Eval Moderate Complexity: 1 Procedure OT Treatments $Self Care/Home Management : 8-22 mins G-Codes:    Peri Maris 02/17/2016, 3:31 PM    Jeri Modena   OTR/L Pager: [phone] Office: [phone] .

## 2016-02-17 MED ORDER — DIAZEPAM 5 MG PO TABS: 5.0000 mg | ORAL_TABLET | Freq: Four times a day (QID) | ORAL | 0 refills | Status: DC | PRN

## 2016-02-17 MED ORDER — DEXAMETHASONE 1 MG PO TABS: ORAL_TABLET | ORAL | 0 refills | Status: DC

## 2016-02-17 MED ORDER — OXYCODONE HCL 5 MG PO TABS: 5.0000 mg | ORAL_TABLET | Freq: Once | ORAL | 0 refills | Status: DC | PRN

## 2016-02-17 NOTE — Discharge Summary (Signed)
Physician Discharge Summary  Patient ID: [name] MRN: [id number] DOB/AGE: 03-05-1946 70 y.o.  Admit date: 02/15/2016 Discharge date: 02/17/2016  Admission Diagnoses:Cervical spondylosis with myelopathy C3-4 C5-6 and C6-C7  Discharge Diagnoses: Cervical spondylosis with myelopathy C3-4 C5-6 and C6-C7, cervical radiculopathy Active Problems:   Cervical myelopathy with cervical radiculopathy   Discharged Condition: good  Hospital Course: Patient was admitted to undergo three-level anterior decompression arthrodesis which he tolerated well. His vision is clean and dry.  Consults: None  Significant Diagnostic Studies: Anterior cervical decompression arthrodesis C3-4 C5-6 and C6-C7  Treatments: surgery: Anterior cervical decompression C3-4 C5-6 and C6-C7  Discharge Exam: Blood pressure 126/71, pulse 66, temperature 98.1 F (36.7 C), temperature source Oral, resp. rate 18, height 5\' 4"  (1.626 m), weight 81.2 kg (179 lb), SpO2 97 %. Incision is clean and dry motor function is intact in the upper extremities. Station and gait are normal. Patient swallowed capacity is normal.  Disposition: Discharge home  Discharge Instructions    Call MD for:  redness, tenderness, or signs of infection (pain, swelling, redness, odor or green/yellow discharge around incision site)    Complete by:  As directed   Call MD for:  severe uncontrolled pain    Complete by:  As directed   Call MD for:  temperature >100.4    Complete by:  As directed   Diet - low sodium heart healthy    Complete by:  As directed   Discharge instructions    Complete by:  As directed   Okay to shower. Do not apply salves or appointments to incision. No heavy lifting with the upper extremities greater than 15 pounds. May resume driving when not requiring pain medication and patient feels comfortable with doing so.   Increase activity slowly    Complete by:  As directed       Medication List    TAKE these medications    aspirin EC 81 MG tablet Take 81 mg by mouth at bedtime.   b complex vitamins capsule Take 1 capsule by mouth daily.   citalopram 40 MG tablet Commonly known as:  CELEXA Take 40 mg by mouth daily.   dexamethasone 1 MG tablet Commonly known as:  DECADRON 2 tablets twice daily for 2 days, one tablet twice daily for 2 days, one tablet daily for 2 days.   diazepam 5 MG tablet Commonly known as:  VALIUM Take 1 tablet (5 mg total) by mouth every 6 (six) hours as needed for muscle spasms.   fexofenadine 180 MG tablet Commonly known as:  ALLEGRA Take 180 mg by mouth at bedtime.   fluticasone 50 MCG/ACT nasal spray Commonly known as:  FLONASE Place 2 sprays into the nose daily.   HYDROcodone-acetaminophen 5-325 MG tablet Commonly known as:  NORCO/VICODIN Take 1 tablet by mouth every 6 (six) hours as needed for moderate pain.   Melatonin 10 MG Tabs Take 10 mg by mouth at bedtime.   meloxicam 15 MG tablet Commonly known as:  MOBIC Take 15 mg by mouth daily.   methylphenidate 10 MG tablet Commonly known as:  RITALIN Take 10 mg by mouth daily as needed (focus).   OCUSOFT LID SCRUB EX Apply 1 application topically daily as needed (dry eyes).   oxyCODONE 5 MG immediate release tablet Commonly known as:  Oxy IR/ROXICODONE Take 1 tablet (5 mg total) by mouth once as needed for severe pain (for pain score of 1-4).   ranitidine 150 MG tablet Commonly known as:  ZANTAC  Take 150 mg by mouth daily.   simvastatin 20 MG tablet Commonly known as:  ZOCOR Take 20 mg by mouth daily.   sodium chloride 0.65 % Soln nasal spray Commonly known as:  OCEAN Place 1 spray into both nostrils at bedtime.   SYSTANE BALANCE OP Apply 1 drop to eye at bedtime.   SYSTANE ULTRA OP Apply 1 drop to eye daily as needed (dry eyes).   tiZANidine 2 MG tablet Commonly known as:  ZANAFLEX Take 2 mg by mouth 2 (two) times daily as needed for muscle spasms.   triamterene-hydrochlorothiazide 37.5-25  MG tablet Commonly known as:  MAXZIDE-25 Take 1 tablet by mouth daily.   Vitamin D3 5000 units Caps Take 10,000 Units by mouth daily.        SignedEarleen Newport 02/17/2016, 9:18 AM

## 2016-02-17 NOTE — Progress Notes (Signed)
Physical Therapy Treatment/ Discharge Patient Details Name: [name] MRN: [id number] DOB: February 15, 1946 Today's Date: Mar 11, 2016    History of Present Illness 70 y/o female s/p C3-7 ACDF (02/15/16)  PMH: ADD, anxiety, GERD, OSA, back pain    PT Comments    Pt very pleasant and moving well. Pt able to state all restrictions and only needed cues for safe transition to standing with RW in front of her. Pt will have necessary assist from spouse and safe from a mobility stand point for return home. All education completed and no further acute needs at this time. Will sign off, pt aware and agreeable.   Follow Up Recommendations  No PT follow up;Supervision - Intermittent     Equipment Recommendations       Recommendations for Other Services       Precautions / Restrictions Precautions Precautions: Cervical    Mobility  Bed Mobility Overal bed mobility: Modified Independent             General bed mobility comments: with bed flat and no rail pt able to enter and exit bed safely with increased time  Transfers Overall transfer level: Needs assistance   Transfers: Sit to/from Stand           General transfer comment: cues for hand placement not to pull on RW  Ambulation/Gait Ambulation/Gait assistance: Modified independent (Device/Increase time) Ambulation Distance (Feet): 400 Feet Assistive device: Rolling walker (2 wheeled) Gait Pattern/deviations: Step-through pattern   Gait velocity interpretation: at or above normal speed for age/gender General Gait Details: Pt utilized RW throughout gait without difficulty and good posture   Stairs Stairs: Yes Stairs assistance: Modified independent (Device/Increase time) Stair Management: One rail Right;Forwards Number of Stairs: 1    Wheelchair Mobility    Modified Rankin (Stroke Patients Only)       Balance                                    Cognition Arousal/Alertness:  Awake/alert Behavior During Therapy: WFL for tasks assessed/performed Overall Cognitive Status: Within Functional Limits for tasks assessed                      Exercises      General Comments        Pertinent Vitals/Pain Pain Score: 3  Pain Location: neck Pain Descriptors / Indicators: Sore Pain Intervention(s): Limited activity within patient's tolerance;Premedicated before session    Home Living                      Prior Function            PT Goals (current goals can now be found in the care plan section) Progress towards PT goals: Goals met/education completed, patient discharged from PT    Frequency       PT Plan Current plan remains appropriate    Co-evaluation             End of Session   Activity Tolerance: Patient tolerated treatment well Patient left: in chair;with call bell/phone within reach     Time: 0801-0824 PT Time Calculation (min) (ACUTE ONLY): 23 min  Charges:  $Gait Training: 8-22 mins                    G Codes:      Melford Aase 03/11/16, 9:03 AM Lanetta Inch  [place], [place]

## 2016-02-17 NOTE — Progress Notes (Signed)
Patient alert and oriented, mae's well, voiding adequate amount of urine, swallowing without difficulty, c/o moderate pain and meds given prior to discharged for ride and discomfort. Patient discharged home with family. Script and discharged instructions given to patient. Patient and family stated understanding of instructions given.   

## 2016-05-23 ENCOUNTER — Other Ambulatory Visit: Payer: Self-pay | Admitting: Orthopedic Surgery

## 2016-05-23 DIAGNOSIS — G8929 Other chronic pain: Secondary | ICD-10-CM

## 2016-05-23 DIAGNOSIS — M25511 Pain in right shoulder: Principal | ICD-10-CM

## 2016-05-31 ENCOUNTER — Ambulatory Visit
Admission: RE | Admit: 2016-05-31 | Discharge: 2016-05-31 | Disposition: A | Discharge status: Home / Self Care | Payer: Medicare Other | Source: Ambulatory Visit | Attending: Orthopedic Surgery | Admitting: Orthopedic Surgery

## 2016-05-31 DIAGNOSIS — M25511 Pain in right shoulder: Principal | ICD-10-CM

## 2016-05-31 DIAGNOSIS — G8929 Other chronic pain: Secondary | ICD-10-CM

## 2016-06-05 ENCOUNTER — Other Ambulatory Visit: Payer: Self-pay | Admitting: Family Medicine

## 2016-06-05 DIAGNOSIS — E2839 Other primary ovarian failure: Secondary | ICD-10-CM

## 2016-06-09 ENCOUNTER — Ambulatory Visit
Admission: RE | Admit: 2016-06-09 | Discharge: 2016-06-09 | Disposition: A | Discharge status: Home / Self Care | Payer: Medicare Other | Source: Ambulatory Visit | Attending: Family Medicine | Admitting: Family Medicine

## 2016-06-09 DIAGNOSIS — E2839 Other primary ovarian failure: Secondary | ICD-10-CM

## 2016-10-27 ENCOUNTER — Other Ambulatory Visit: Payer: Self-pay | Admitting: Family Medicine

## 2016-10-27 DIAGNOSIS — Z1231 Encounter for screening mammogram for malignant neoplasm of breast: Secondary | ICD-10-CM

## 2016-11-10 ENCOUNTER — Ambulatory Visit
Admission: RE | Admit: 2016-11-10 | Discharge: 2016-11-10 | Disposition: A | Discharge status: Home / Self Care | Payer: Medicare Other | Source: Ambulatory Visit | Attending: Family Medicine | Admitting: Family Medicine

## 2016-11-10 DIAGNOSIS — Z1231 Encounter for screening mammogram for malignant neoplasm of breast: Secondary | ICD-10-CM

## 2016-11-10 HISTORY — DX: Personal history of irradiation: Z92.3

## 2016-11-15 ENCOUNTER — Other Ambulatory Visit: Payer: Self-pay | Admitting: Family Medicine

## 2016-11-15 DIAGNOSIS — R928 Other abnormal and inconclusive findings on diagnostic imaging of breast: Secondary | ICD-10-CM

## 2016-11-17 ENCOUNTER — Other Ambulatory Visit: Payer: Self-pay | Admitting: Family Medicine

## 2016-11-17 ENCOUNTER — Ambulatory Visit
Admission: RE | Admit: 2016-11-17 | Discharge: 2016-11-17 | Disposition: A | Discharge status: Home / Self Care | Payer: Medicare Other | Source: Ambulatory Visit | Attending: Family Medicine | Admitting: Family Medicine

## 2016-11-17 DIAGNOSIS — R928 Other abnormal and inconclusive findings on diagnostic imaging of breast: Secondary | ICD-10-CM

## 2016-11-17 DIAGNOSIS — N63 Unspecified lump in unspecified breast: Secondary | ICD-10-CM

## 2016-11-17 DIAGNOSIS — Z1231 Encounter for screening mammogram for malignant neoplasm of breast: Secondary | ICD-10-CM

## 2017-05-21 ENCOUNTER — Ambulatory Visit
Admission: RE | Admit: 2017-05-21 | Discharge: 2017-05-21 | Disposition: A | Discharge status: Home / Self Care | Payer: Medicare Other | Source: Ambulatory Visit | Attending: Family Medicine | Admitting: Family Medicine

## 2017-05-21 ENCOUNTER — Other Ambulatory Visit: Payer: Self-pay | Admitting: Orthopedic Surgery

## 2017-05-21 DIAGNOSIS — N63 Unspecified lump in unspecified breast: Secondary | ICD-10-CM

## 2017-05-21 DIAGNOSIS — R2230 Localized swelling, mass and lump, unspecified upper limb: Secondary | ICD-10-CM

## 2017-06-02 ENCOUNTER — Ambulatory Visit
Admission: RE | Admit: 2017-06-02 | Discharge: 2017-06-02 | Disposition: A | Discharge status: Home / Self Care | Payer: Medicare Other | Source: Ambulatory Visit | Attending: Orthopedic Surgery | Admitting: Orthopedic Surgery

## 2017-06-02 DIAGNOSIS — R2230 Localized swelling, mass and lump, unspecified upper limb: Secondary | ICD-10-CM

## 2017-10-02 DIAGNOSIS — R51 Headache: Secondary | ICD-10-CM | POA: Diagnosis not present

## 2017-10-02 DIAGNOSIS — G4451 Hemicrania continua: Secondary | ICD-10-CM | POA: Diagnosis not present

## 2017-10-16 DIAGNOSIS — E876 Hypokalemia: Secondary | ICD-10-CM | POA: Diagnosis not present

## 2017-10-23 ENCOUNTER — Ambulatory Visit: Payer: Medicare Other | Admitting: Neurology

## 2017-10-23 ENCOUNTER — Encounter: Payer: Self-pay | Admitting: Neurology

## 2017-10-23 ENCOUNTER — Telehealth: Payer: Self-pay | Admitting: Neurology

## 2017-10-23 VITALS — BP 111/64 | HR 64 | Ht 64.0 in | Wt 242.8 lb

## 2017-10-23 DIAGNOSIS — R51 Headache: Secondary | ICD-10-CM | POA: Diagnosis not present

## 2017-10-23 DIAGNOSIS — R519 Headache, unspecified: Secondary | ICD-10-CM | POA: Insufficient documentation

## 2017-10-23 MED ORDER — SUMATRIPTAN SUCCINATE 25 MG PO TABS: 25.0000 mg | ORAL_TABLET | ORAL | 5 refills | Status: DC | PRN

## 2017-10-23 MED ORDER — TIZANIDINE HCL 2 MG PO CAPS: 2.0000 mg | ORAL_CAPSULE | Freq: Three times a day (TID) | ORAL | 6 refills | Status: DC

## 2017-10-23 MED ORDER — NORTRIPTYLINE HCL 10 MG PO CAPS: 20.0000 mg | ORAL_CAPSULE | Freq: Every day | ORAL | 11 refills | Status: DC

## 2017-10-23 NOTE — Telephone Encounter (Signed)
[employer] Josem Kaufmann: [id number] (exp. 10/23/17 to 11/21/17) order faxed to [facility] since patient wants open MRI. They will reach out tot he patient to schedule.

## 2017-10-23 NOTE — Progress Notes (Addendum)
PATIENT: [name] DOB: 08-13-1945  Chief Complaint  Patient presents with  . Cervicogenic Headache    After having cervical surgery in August 2017, she developed daily headaches.  She is currently under the care of pain management. She was sent here for an evaluation of Botox treatment.  Marland Kitchen PCP    Leonard Downing, MD  . Pain Management    Clydell Hakim, MD (referring MD)  . Neurosurgeon    Kristeen Miss, MD     HISTORICAL  [name] is a 72 years old female, seen in refer by her pain management doctor Clydell Hakim for evaluation of neck pain, frequent headaches, potential Botox injection, her primary care physician is Dr. Arelia Sneddon, [facility], initial evaluation was on Oct 23, 2017.  She has past medical history of hyperlipidemia, cervical decompression surgery in the past.  Since 2015, he presented with frequent headaches, at that time she also has significant neck pain, eventually had anterior cervical decompression with fusion of C3-4, C5-6, and C6 and 7 by Dr. Ellene Route in August 2017, surgery did help her neck pain, but when she gradually tapering off the posterior surgical pain medications, she began to notice recurrence of her headaches.  Gradually getting worse over the past few years, now she has constant left-sided neck pain, radiating to left occipital left parietal temporal region, involving her left jaw, upper tooth constant deep achy pain, over the years, she has been taking 2 tablets of extra strength Tylenol on a regular basis, usually she consume 12 to14 tablets of double strength a day, she also tried multiple over-the-counter CBD oil, chiropractor adjustment, without helping her symptoms.  She had a history of migraine headaches in the past, that was hormone related, her typical migraine are lateralized severe pounding headache with associated light noise sensitivity, current she has constant 9 out of 10 headaches, difficulty functioning due to her  headaches, she denies significant autonomic features.  She was seen by pain management Dr. Luan Pulling, diagnosed with cervicogenic pain, was giving a trial of indomethacin twice a day for 6 days, with mild improvement, but has recurrent headaches once the medicine was stopped.  She has such constant deep achy pain, also complains of chewing difficulty, left upper tooth pain, even was evaluated by dentist in the past without clear etiology found, she has been taking Topamax titrating dose, currently topiramate ER 200 mg 2 tablets every night, which have provided some help,  REVIEW OF SYSTEMS: Full 14 system review of systems performed and notable only for headache, snoring, incontinence  ALLERGIES: Allergies  Allergen Reactions  . Nickel Hives and Rash    HOME MEDICATIONS: Current Outpatient Medications  Medication Sig Dispense Refill  . acetaminophen (TYLENOL) 500 MG tablet Take 1,000 mg by mouth as needed.    . Cholecalciferol (VITAMIN D3) 5000 units CAPS Take 5,000 Units by mouth daily.     . citalopram (CELEXA) 40 MG tablet Take 40 mg by mouth daily.      . fexofenadine (ALLEGRA) 180 MG tablet Take 180 mg by mouth at bedtime.     . fluticasone (FLONASE) 50 MCG/ACT nasal spray Place 2 sprays into the nose daily.      Marland Kitchen HYDROcodone-acetaminophen (NORCO/VICODIN) 5-325 MG tablet Take 1 tablet by mouth every 6 (six) hours as needed for moderate pain.    . meloxicam (MOBIC) 15 MG tablet Take 15 mg by mouth daily.    . methylphenidate (RITALIN) 10 MG tablet Take 10 mg by  mouth daily as needed (focus).     . ranitidine (ZANTAC) 150 MG tablet Take 150 mg by mouth daily.    . simvastatin (ZOCOR) 20 MG tablet Take 20 mg by mouth daily.     . Topiramate ER (TROKENDI XR) 200 MG CP24 Take 400 mg by mouth 2 (two) times daily.    Marland Kitchen triamterene-hydrochlorothiazide (MAXZIDE-25) 37.5-25 MG tablet Take 1 tablet by mouth daily.  0  . UNABLE TO FIND CBD oil for pain.     No current facility-administered  medications for this visit.     PAST MEDICAL HISTORY: Past Medical History:  Diagnosis Date  . ADD (attention deficit disorder)   . Anxiety   . Arthritis    os  . GERD (gastroesophageal reflux disease)    barretts  . Headache   . High cholesterol   . Neck pain   . Obesity   . Personal history of radiation therapy 2005  . Sleep apnea    cpap   last sleep study> 3 yrs  . Swelling of both ankles     PAST SURGICAL HISTORY: Past Surgical History:  Procedure Laterality Date  . ABDOMINAL HYSTERECTOMY    . ANTERIOR CERVICAL DECOMP/DISCECTOMY FUSION N/A 02/15/2016   Procedure: Cervical three four-Cervical five-six, Cervical six-seven Anterior cervical decompression/diskectomy/fusion;  Surgeon: Kristeen Miss, MD;  Location: [facility];  Service: Neurosurgery;  Laterality: N/A;  . bicept surgery    . BREAST LUMPECTOMY    . CHOLECYSTECTOMY    . JOINT REPLACEMENT    . PAROTID GLAND TUMOR EXCISION    . TONSILLECTOMY    . TOTAL KNEE ARTHROPLASTY  07/19/2012   Procedure: TOTAL KNEE ARTHROPLASTY;  Surgeon: Yvette Rack., MD;  Location: [facility];  Service: Orthopedics;  Laterality: Right;  right total knee arthroplasty  . TUBAL LIGATION      FAMILY HISTORY: Family History  Problem Relation Age of Onset  . Breast cancer Maternal Aunt   . Breast cancer Paternal Aunt   . Lung cancer Mother        age 70  . Colon cancer Father        age 38    SOCIAL HISTORY:  Social History   Socioeconomic History  . Marital status: Married    Spouse name: Not on file  . Number of children: 2  . Years of education: some college  . Highest education level: Not on file  Occupational History  . Occupation: Retired  Scientific laboratory technician  . Financial resource strain: Not on file  . Food insecurity:    Worry: Not on file    Inability: Not on file  . Transportation needs:    Medical: Not on file    Non-medical: Not on file  Tobacco Use  . Smoking status: Former Smoker    Last attempt to quit:  07/11/1997    Years since quitting: 20.2  . Smokeless tobacco: Never Used  Substance and Sexual Activity  . Alcohol use: Not Currently  . Drug use: No  . Sexual activity: Not on file  Lifestyle  . Physical activity:    Days per week: Not on file    Minutes per session: Not on file  . Stress: Not on file  Relationships  . Social connections:    Talks on phone: Not on file    Gets together: Not on file    Attends religious service: Not on file    Active member of club or organization: Not on file  Attends meetings of clubs or organizations: Not on file    Relationship status: Not on file  . Intimate partner violence:    Fear of current or ex partner: Not on file    Emotionally abused: Not on file    Physically abused: Not on file    Forced sexual activity: Not on file  Other Topics Concern  . Not on file  Social History Narrative   Has her own house but her husband (separated) comes over daily.   32 ounces per day.   Right-handed.     PHYSICAL EXAM   Vitals:   10/23/17 0825  BP: 111/64  Pulse: 64  Weight: 242 lb 12 oz (110.1 kg)  Height: 5' 4"  (1.626 m)    Not recorded      Body mass index is 41.67 kg/m.  PHYSICAL EXAMNIATION:  Gen: NAD, conversant, well nourised, obese, well groomed                     Cardiovascular: Regular rate rhythm, no peripheral edema, warm, nontender. Eyes: Conjunctivae clear without exudates or hemorrhage Neck: Supple, no carotid bruits. Pulmonary: Clear to auscultation bilaterally   NEUROLOGICAL EXAM:  MENTAL STATUS: Speech:    Speech is normal; fluent and spontaneous with normal comprehension.  Cognition:     Orientation to time, place and person     Normal recent and remote memory     Normal Attention span and concentration     Normal Language, naming, repeating,spontaneous speech     Fund of knowledge   CRANIAL NERVES: CN II: Visual fields are full to confrontation. Fundoscopic exam is normal with sharp discs and no  vascular changes. Pupils are round equal and briskly reactive to light. CN III, IV, VI: extraocular movement are normal. No ptosis. CN V: Facial sensation is intact to pinprick in all 3 divisions bilaterally. Corneal responses are intact.  CN VII: Face is symmetric with normal eye closure and smile. CN VIII: Hearing is normal to rubbing fingers CN IX, X: Palate elevates symmetrically. Phonation is normal. CN XI: Head turning and shoulder shrug are intact CN XII: Tongue is midline with normal movements and no atrophy.  MOTOR: There is no pronator drift of out-stretched arms. Muscle bulk and tone are normal. Muscle strength is normal.  REFLEXES: Reflexes are 2+ and symmetric at the biceps, triceps, knees, and ankles. Plantar responses are flexor.  SENSORY: Intact to light touch, pinprick, positional sensation and vibratory sensation are intact in fingers and toes.  COORDINATION: Rapid alternating movements and fine finger movements are intact. There is no dysmetria on finger-to-nose and heel-knee-shin.    GAIT/STANCE: Posture is normal. Gait is steady with normal steps, base, arm swing, and turning. Heel and toe walking are normal. Tandem gait is normal.  Romberg is absent.   DIAGNOSTIC DATA (LABS, IMAGING, TESTING) - I reviewed patient records, labs, notes, testing and imaging myself where available.   ASSESSMENT AND PLAN  [name] is a 72 y.o. female    Persistent left-sided headaches,  Need to rule out temporal arteritis, laboratory evaluation including ESR C-reactive protein,  MRI of brain  No significant improvement with trial of indomethacin treatment,  Continue current dose of topiramate xr 200 mg 2 tablets every night, add on nortriptyline 10 mg titrating to 20 mg every night as preventive medications,  Stop daily over-the-counter medication use,  Imitrex 25 mg as needed    Marcial Pacas, M.D. Ph.D.  [facility] [id number]  [address] [place], [place] [address] Ph: [phone] Fax: [phone]  CC: Clydell Hakim, MD, Leonard Downing, MD  Addendum: MRI of the brain report from Oct 24, 2017, [place], no evidence of intracranial hemorrhage, no acute abnormality, mild small vessel disease, trace amount of fluid in the right mastoid cells,  We have MRI CD on file

## 2017-10-24 ENCOUNTER — Telehealth: Payer: Self-pay | Admitting: Neurology

## 2017-10-24 DIAGNOSIS — R51 Headache: Secondary | ICD-10-CM | POA: Diagnosis not present

## 2017-10-24 DIAGNOSIS — R899 Unspecified abnormal finding in specimens from other organs, systems and tissues: Secondary | ICD-10-CM

## 2017-10-24 LAB — BUN+CREAT
BUN/Creatinine Ratio: 11 — ABNORMAL LOW (ref 12–28)
BUN: 14 mg/dL (ref 8–27)
CREATININE: 1.27 mg/dL — AB (ref 0.57–1.00)
GFR, EST AFRICAN AMERICAN: 49 mL/min/{1.73_m2} — AB (ref >59)
GFR, EST NON AFRICAN AMERICAN: 43 mL/min/{1.73_m2} — AB (ref >59)

## 2017-10-24 LAB — TSH: TSH: 1.99 u[IU]/mL (ref 0.450–4.500)

## 2017-10-24 LAB — VITAMIN B12: Vitamin B-12: 251 pg/mL (ref 232–1245)

## 2017-10-24 LAB — HGB A1C W/O EAG: Hgb A1c MFr Bld: 5.5 % (ref 4.8–5.6)

## 2017-10-24 LAB — SEDIMENTATION RATE: Sed Rate: 16 mm/hr (ref 0–40)

## 2017-10-24 LAB — ANA W/REFLEX IF POSITIVE: ANA: NEGATIVE

## 2017-10-24 LAB — C-REACTIVE PROTEIN: CRP: 64.9 mg/L — ABNORMAL HIGH (ref 0.0–4.9)

## 2017-10-24 NOTE — Telephone Encounter (Signed)
Please call patient, laboratory evaluation showed significantly elevated C-reactive protein 65, normal ESR,  #1, I will repeat laboratory evaluations, lab order was placed,  #2, above findings could indicate systemic inflammatory process, ask if she has any signs of active infection, UTI, upper respiratory infection,  3, based on the repeat laboratory values, will make a decision for potential treatment.

## 2017-10-24 NOTE — Telephone Encounter (Signed)
Denies any current signs of infection.  Says a few weeks ago she had some stinging and burning with urination.  However, after increasing her water intake, these symptoms resolved without treatment.  She is agreeable to having repeat labs and will come by our office no later than next week.

## 2017-10-30 ENCOUNTER — Other Ambulatory Visit (INDEPENDENT_AMBULATORY_CARE_PROVIDER_SITE_OTHER): Payer: Self-pay

## 2017-10-30 DIAGNOSIS — Z0289 Encounter for other administrative examinations: Secondary | ICD-10-CM

## 2017-10-30 DIAGNOSIS — R899 Unspecified abnormal finding in specimens from other organs, systems and tissues: Secondary | ICD-10-CM | POA: Diagnosis not present

## 2017-10-31 ENCOUNTER — Telehealth: Payer: Self-pay | Admitting: Neurology

## 2017-10-31 LAB — C-REACTIVE PROTEIN: CRP: 7.9 mg/L — ABNORMAL HIGH (ref 0.0–4.9)

## 2017-10-31 LAB — SEDIMENTATION RATE: SED RATE: 20 mm/h (ref 0–40)

## 2017-10-31 LAB — CK: Total CK: 43 U/L (ref 24–173)

## 2017-10-31 NOTE — Telephone Encounter (Signed)
Spoke with [name] and reviewed below lab results and that there is not currently enough clinical evidence for Dr. Krista Blue to make a clear dx.  She verbalized understanding of same/fim

## 2017-10-31 NOTE — Telephone Encounter (Signed)
Please call patient, repeat laboratory evaluation showed normal ESR, CPK number has much improved but remain mildly elevated only 7.9, in comparison to previous number of 64.9, normal CPK level,  There is not enough evidence to support a diagnosis of systematic inflammatory process, will continue to observe her symptoms,

## 2017-11-14 ENCOUNTER — Telehealth: Payer: Self-pay | Admitting: Neurology

## 2017-11-14 NOTE — Telephone Encounter (Signed)
Patient calling to get MRI results. °

## 2017-11-14 NOTE — Telephone Encounter (Signed)
Spoke to patient - she is aware to expect a call to discuss results tomorrow when Dr. Krista Blue returns.  The patient says she has an doctor's appt at 1:45pm but will be available all day otherwise.

## 2017-11-15 NOTE — Telephone Encounter (Signed)
Spoke to patient - she is aware of her MRI results.  She will keep her pending appt for further discussion.

## 2017-11-15 NOTE — Telephone Encounter (Signed)
MRI brain showed mild age related changes, there is no acute abnormality.   We will review MRI at her next visit.

## 2017-12-06 ENCOUNTER — Telehealth: Payer: Self-pay | Admitting: Neurology

## 2017-12-06 ENCOUNTER — Ambulatory Visit: Payer: Medicare Other | Admitting: Neurology

## 2017-12-06 ENCOUNTER — Encounter: Payer: Self-pay | Admitting: Neurology

## 2017-12-06 VITALS — BP 119/73 | HR 73 | Ht 64.0 in | Wt 247.0 lb

## 2017-12-06 DIAGNOSIS — G43709 Chronic migraine without aura, not intractable, without status migrainosus: Secondary | ICD-10-CM | POA: Diagnosis not present

## 2017-12-06 DIAGNOSIS — IMO0002 Reserved for concepts with insufficient information to code with codable children: Secondary | ICD-10-CM

## 2017-12-06 NOTE — Telephone Encounter (Signed)
Called [facility] and voided sumatriptan refills on file.

## 2017-12-06 NOTE — Telephone Encounter (Signed)
Please call her pharmacy to cancel her imitrex 25mg  order.

## 2017-12-06 NOTE — Telephone Encounter (Signed)
1 month btx

## 2017-12-06 NOTE — Addendum Note (Signed)
Addended by: Noberto Retort C on: 12/06/2017 04:30 PM   Modules accepted: Orders

## 2017-12-06 NOTE — Progress Notes (Signed)
PATIENT: [name] DOB: 1946-02-14  Chief Complaint  Patient presents with  . Follow-up    Pt here alone. Pt reports that she is doing a little better since starting 7194m of CBD oil.      HISTORICAL  Patricia GARSKEis a 72years old female, seen in refer by her pain management doctor HClydell Hakimfor evaluation of neck pain, frequent headaches, potential Botox injection, her primary care physician is Dr. EArelia Sneddon WCurt Jews initial evaluation was on Oct 23, 2017.  She has past medical history of hyperlipidemia, cervical decompression surgery in the past.  Since 2015, he presented with frequent headaches, at that time she also has significant neck pain, eventually had anterior cervical decompression with fusion of C3-4, C5-6, and C6 and 7 by Dr. EEllene Routein August 2017, surgery did help her neck pain, but when she gradually tapering off the posterior surgical pain medications, she began to notice recurrence of her headaches.  Gradually getting worse over the past few years, now she has constant left-sided neck pain, radiating to left occipital left parietal temporal region, involving her left jaw, upper tooth constant deep achy pain, over the years, she has been taking 2 tablets of extra strength Tylenol on a regular basis, usually she consume 12 to14 tablets of double strength a day, she also tried multiple over-the-counter CBD oil, chiropractor adjustment, without helping her symptoms.  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  She was seen by pain management Dr. HLuan Pulling diagnosed with cervicogenic pain, was given a trial of indomethacin twice a day for 6 days, with mild improvement, but has recurrent  headaches once the medicine was stopped.  She has such constant deep achy pain, also complains of chewing difficulty, left upper tooth pain, even was evaluated by dentist in the past without clear etiology found, she has been taking Topamax titrating dose, currently topiramate ER 200 mg 71 tablets every night, which have provided some help,  UPDATE December 06 2017: [facility] [place] there is evidence of periventricular white matter small vessel disease, also involving pons, no acute abnormality,  She has tried Imitrex 25 mg as needed, which has been very helpful for headaches, but she used up 15 tablets within a short period of time, when she read about the potential side effect including stroke, she is very upset, decided not to take it anymore, continue with 15 tablets of Imitrex, after discussed with patient, we decided to discard it into disposal bin  Over the past few weeks, she has been taking CBD oil, which has been helpful,,  But she could not afford her long-term use, preferred to proceed with Botox injection as migraine prevention  Laboratory evaluation initially showed elevated C-reactive protein 65, correlate with her urinary tract infection, much improved to 7.9 at repeat laboratory evaluation without treatment, normal ESR, there is no evidence of temporal arteritis  Normal B12, TSH, negative ANA, A1c was 5.5,   REVIEW OF SYSTEMS: Full 14 system review of systems performed and notable only for headache, snoring, incontinence  ALLERGIES: Allergies  Allergen Reactions  . Nickel Hives and Rash    HOME MEDICATIONS: Current Outpatient Medications  Medication Sig Dispense Refill  .  Cholecalciferol (VITAMIN D3) 5000 units CAPS Take 5,000 Units by mouth daily.     . citalopram (CELEXA) 40 MG tablet Take 40 mg by mouth daily.      . fexofenadine (ALLEGRA) 180 MG tablet Take 180 mg by mouth at bedtime.     . fluticasone  (FLONASE) 50 MCG/ACT nasal spray Place 2 sprays into the nose daily.      . meloxicam (MOBIC) 15 MG tablet Take 15 mg by mouth daily.    . methylphenidate (RITALIN) 10 MG tablet Take 10 mg by mouth daily as needed (focus).     . nortriptyline (PAMELOR) 10 MG capsule Take 2 capsules (20 mg total) by mouth at bedtime. 60 capsule 11  . ranitidine (ZANTAC) 150 MG tablet Take 150 mg by mouth daily.    . simvastatin (ZOCOR) 20 MG tablet Take 20 mg by mouth daily.     . tizanidine (ZANAFLEX) 2 MG capsule Take 1 capsule (2 mg total) by mouth 3 (three) times daily. 60 capsule 6  . Topiramate ER (TROKENDI XR) 200 MG CP24 Take 400 mg by mouth 2 (two) times daily.    Marland Kitchen triamterene-hydrochlorothiazide (MAXZIDE-25) 37.5-25 MG tablet Take 1 tablet by mouth daily.  0  . UNABLE TO FIND CBD oil 5019m Twice a day for pain.    . SUMAtriptan (IMITREX) 25 MG tablet Take 1 tablet (25 mg total) by mouth every 2 (two) hours as needed for migraine. May repeat in 2 hours if headache persists or recurs. (Patient not taking: Reported on 12/06/2017) 15 tablet 5   No current facility-administered medications for this visit.     PAST MEDICAL HISTORY: Past Medical History:  Diagnosis Date  . ADD (attention deficit disorder)   . Anxiety   . Arthritis    os  . GERD (gastroesophageal reflux disease)    barretts  . Headache   . High cholesterol   . Neck pain   . Obesity   . Personal history of radiation therapy 2005  . Sleep apnea    cpap   last sleep study> 3 yrs  . Swelling of both ankles     PAST SURGICAL HISTORY: Past Surgical History:  Procedure Laterality Date  . ABDOMINAL HYSTERECTOMY    . ANTERIOR CERVICAL DECOMP/DISCECTOMY FUSION N/A 02/15/2016   Procedure: Cervical three four-Cervical five-six, Cervical six-seven Anterior cervical decompression/diskectomy/fusion;  Surgeon: HKristeen Miss MD;  Location: [facility];  Service: Neurosurgery;  Laterality: N/A;  . bicept surgery    . BREAST LUMPECTOMY    .  CHOLECYSTECTOMY    . JOINT REPLACEMENT    . PAROTID GLAND TUMOR EXCISION    . TONSILLECTOMY    . TOTAL KNEE ARTHROPLASTY  07/19/2012   Procedure: TOTAL KNEE ARTHROPLASTY;  Surgeon: WYvette Rack, MD;  Location: [facility]  Service: Orthopedics;  Laterality: Right;  right total knee arthroplasty  . TUBAL LIGATION      FAMILY HISTORY: Family History  Problem Relation Age of Onset  . Breast cancer Maternal Aunt   . Breast cancer Paternal Aunt   . Lung cancer Mother        age [id number] . Colon cancer Father        age 71   SOCIAL HISTORY:  Social History   Socioeconomic History  . Marital status: Married    Spouse name: Not on file  . Number of children: 2  . Years of education: some college  . Highest education level: Not on file  Occupational History  . Occupation: Retired  Scientific laboratory technician  . Financial resource strain: Not on file  . Food insecurity:    Worry: Not on file    Inability: Not on file  . Transportation needs:    Medical: Not on file    Non-medical: Not on file  Tobacco Use  . Smoking status: Former Smoker    Last attempt to quit: 07/11/1997    Years since quitting: 20.4  . Smokeless tobacco: Never Used  Substance and Sexual Activity  . Alcohol use: Not Currently  . Drug use: No  . Sexual activity: Not on file  Lifestyle  . Physical activity:    Days per week: Not on file    Minutes per session: Not on file  . Stress: Not on file  Relationships  . Social connections:    Talks on phone: Not on file    Gets together: Not on file    Attends religious service: Not on file    Active member of club or organization: Not on file    Attends meetings of clubs or organizations: Not on file    Relationship status: Not on file  . Intimate partner violence:    Fear of current or ex partner: Not on file    Emotionally abused: Not on file    Physically abused: Not on file    Forced sexual activity: Not on file  Other Topics Concern  . Not on file  Social History  Narrative   Has her own house but her husband (separated) comes over daily.   32 ounces per day.   Right-handed.     PHYSICAL EXAM   Vitals:   12/06/17 1256  BP: 119/73  Pulse: 73  Weight: 247 lb (112 kg)  Height: _0  (1.626 m)    Not recorded      Body mass index is 42.4 kg/m.  PHYSICAL EXAMNIATION:  Gen: NAD, conversant, well nourised, obese, well groomed                     Cardiovascular: Regular rate rhythm, no peripheral edema, warm, nontender. Eyes: Conjunctivae clear without exudates or hemorrhage Neck: Supple, no carotid bruits. Pulmonary: Clear to auscultation bilaterally   NEUROLOGICAL EXAM:  MENTAL STATUS: Speech:    Speech is normal; fluent and spontaneous with normal comprehension.  Cognition:     Orientation to time, place and person     Normal recent and remote memory     Normal Attention span and concentration     Normal Language, naming, repeating,spontaneous speech     Fund of knowledge   CRANIAL NERVES: CN II: Visual fields are full to confrontation. Fundoscopic exam is normal with sharp discs and no vascular changes. Pupils are round equal and briskly reactive to light. CN III, IV, VI: extraocular movement are normal. No ptosis. CN V: Facial sensation is intact to pinprick in all 3 divisions bilaterally. Corneal responses are intact.  CN VII: Face is symmetric with normal eye closure and smile. CN VIII: Hearing is normal to rubbing fingers CN IX, X: Palate elevates symmetrically. Phonation is normal. CN XI: Head turning and shoulder shrug are intact CN XII: Tongue is midline with normal movements and no atrophy.  MOTOR: There is no pronator drift of out-stretched arms. Muscle bulk and tone are normal. Muscle strength is normal.  REFLEXES: Reflexes are 2+ and symmetric at the biceps, triceps, knees, and ankles. Plantar responses are flexor.  SENSORY: Intact to light  touch, pinprick, positional sensation and vibratory sensation are  intact in fingers and toes.  COORDINATION: Rapid alternating movements and fine finger movements are intact. There is no dysmetria on finger-to-nose and heel-knee-shin.    GAIT/STANCE: Posture is normal. Gait is steady with normal steps, base, arm swing, and turning. Heel and toe walking are normal. Tandem gait is normal.  Romberg is absent.   DIAGNOSTIC DATA (LABS, IMAGING, TESTING) - I reviewed patient records, labs, notes, testing and imaging myself where available.   ASSESSMENT AND PLAN  [name] is a 72 y.o. female    Persistent left-sided headaches,  Need to rule out temporal arteritis, laboratory evaluation including ESR C-reactive protein,  MRI of brain  No significant improvement with trial of indomethacin treatment,  Continue current dose of topiramate xr 200 mg 2 tablets every night, add on nortriptyline 10 mg titrating to 20 mg every night as preventive medications,  Stop daily over-the-counter medication use,  Imitrex 25 mg as needed    Marcial Pacas, M.D. Ph.D.  [facility] [address], [place], [place] [address] Ph: [phone] Fax: [phone]  CC: Clydell Hakim, MD, Leonard Downing, MD  Addendum: MRI of the brain report from Oct 24, 2017, [place], no evidence of intracranial hemorrhage, no acute abnormality, mild small vessel disease, trace amount of fluid in the right mastoid cells,      PATIENT: [name] DOB: 07-Jul-1945  Chief Complaint  Patient presents with  . Follow-up    Pt here alone. Pt reports that she is doing a little better since starting 509m of CBD oil.      HISTORICAL  Patricia SHISLERis a 72years old female, seen in refer by her pain management doctor HClydell Hakimfor evaluation of neck pain, frequent headaches, potential Botox injection, her primary care physician is Dr. EArelia Sneddon WCurt Jews initial evaluation was on Oct 23, 2017.  She has past medical history of  hyperlipidemia, cervical decompression surgery in the past.  Since 2015, he presented with frequent headaches, at that time she also has significant neck pain, eventually had anterior cervical decompression with fusion of C3-4, C5-6, and C6 and 7 by Dr. EEllene Routein August 2017, surgery did help her neck pain, but when she gradually tapering off the posterior surgical pain medications, she began to notice recurrence of her headaches.  Gradually getting worse over the past few years, now she has constant left-sided neck pain, radiating to left occipital left parietal temporal region, involving her left jaw, upper tooth constant deep achy pain, over the years, she has been taking 2 tablets of extra strength Tylenol on a regular basis, usually she consume 12 to14 tablets of double strength a day, she also tried multiple over-the-counter CBD oil, chiropractor adjustment, without helping her symptoms.  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  She was seen by pain management Dr. HLuan Pulling diagnosed with cervicogenic pain, was giving a trial of indomethacin twice a day for 6 days, with mild improvement, but has recurrent headaches once the medicine was stopped.  She has such constant deep achy pain, also complains of chewing difficulty, left upper tooth pain, even was evaluated by dentist in the past without clear etiology found, she has been  taking Topamax titrating dose, currently topiramate ER 200 mg 71 tablets every night, which have provided some help,  REVIEW OF SYSTEMS: Full 14 system review of systems performed and notable only for headache, snoring, incontinence  ALLERGIES: Allergies  Allergen Reactions  . Nickel Hives and Rash     HOME MEDICATIONS: Current Outpatient Medications  Medication Sig Dispense Refill  . Cholecalciferol (VITAMIN D3) 5000 units CAPS Take 5,000 Units by mouth daily.     . citalopram (CELEXA) 40 MG tablet Take 40 mg by mouth daily.      . fexofenadine (ALLEGRA) 180 MG tablet Take 180 mg by mouth at bedtime.     . fluticasone (FLONASE) 50 MCG/ACT nasal spray Place 2 sprays into the nose daily.      . meloxicam (MOBIC) 15 MG tablet Take 15 mg by mouth daily.    . methylphenidate (RITALIN) 10 MG tablet Take 10 mg by mouth daily as needed (focus).     . nortriptyline (PAMELOR) 10 MG capsule Take 2 capsules (20 mg total) by mouth at bedtime. 60 capsule 11  . ranitidine (ZANTAC) 150 MG tablet Take 150 mg by mouth daily.    . simvastatin (ZOCOR) 20 MG tablet Take 20 mg by mouth daily.     . tizanidine (ZANAFLEX) 2 MG capsule Take 1 capsule (2 mg total) by mouth 3 (three) times daily. 60 capsule 6  . Topiramate ER (TROKENDI XR) 200 MG CP24 Take 400 mg by mouth 2 (two) times daily.    Marland Kitchen triamterene-hydrochlorothiazide (MAXZIDE-25) 37.5-25 MG tablet Take 1 tablet by mouth daily.  0  . UNABLE TO FIND CBD oil 5042m Twice a day for pain.    . SUMAtriptan (IMITREX) 25 MG tablet Take 1 tablet (25 mg total) by mouth every 2 (two) hours as needed for migraine. May repeat in 2 hours if headache persists or recurs. (Patient not taking: Reported on 12/06/2017) 15 tablet 5   No current facility-administered medications for this visit.     PAST MEDICAL HISTORY: Past Medical History:  Diagnosis Date  . ADD (attention deficit disorder)   . Anxiety   . Arthritis    os  . GERD (gastroesophageal reflux disease)    barretts  . Headache   . High cholesterol   . Neck pain   . Obesity   . Personal history of radiation therapy 2005  . Sleep apnea    cpap   last sleep study> 3 yrs  . Swelling of both ankles     PAST SURGICAL HISTORY: Past Surgical History:  Procedure Laterality Date  . ABDOMINAL  HYSTERECTOMY    . ANTERIOR CERVICAL DECOMP/DISCECTOMY FUSION N/A 02/15/2016   Procedure: Cervical three four-Cervical five-six, Cervical six-seven Anterior cervical decompression/diskectomy/fusion;  Surgeon: HKristeen Miss MD;  Location: [facility];  Service: Neurosurgery;  Laterality: N/A;  . bicept surgery    . BREAST LUMPECTOMY    . CHOLECYSTECTOMY    . JOINT REPLACEMENT    . PAROTID GLAND TUMOR EXCISION    . TONSILLECTOMY    . TOTAL KNEE ARTHROPLASTY  07/19/2012   Procedure: TOTAL KNEE ARTHROPLASTY;  Surgeon: WYvette Rack, MD;  Location: [facility]  Service: Orthopedics;  Laterality: Right;  right total knee arthroplasty  . TUBAL LIGATION      FAMILY HISTORY: Family History  Problem Relation Age of Onset  . Breast cancer Maternal Aunt   . Breast cancer Paternal Aunt   . Lung cancer Mother  age 19  . Colon cancer Father        age 25    SOCIAL HISTORY:  Social History   Socioeconomic History  . Marital status: Married    Spouse name: Not on file  . Number of children: 2  . Years of education: some college  . Highest education level: Not on file  Occupational History  . Occupation: Retired  Scientific laboratory technician  . Financial resource strain: Not on file  . Food insecurity:    Worry: Not on file    Inability: Not on file  . Transportation needs:    Medical: Not on file    Non-medical: Not on file  Tobacco Use  . Smoking status: Former Smoker    Last attempt to quit: 07/11/1997    Years since quitting: 20.4  . Smokeless tobacco: Never Used  Substance and Sexual Activity  . Alcohol use: Not Currently  . Drug use: No  . Sexual activity: Not on file  Lifestyle  . Physical activity:    Days per week: Not on file    Minutes per session: Not on file  . Stress: Not on file  Relationships  . Social connections:    Talks on phone: Not on file    Gets together: Not on file    Attends religious service: Not on file    Active member of club or organization: Not on file     Attends meetings of clubs or organizations: Not on file    Relationship status: Not on file  . Intimate partner violence:    Fear of current or ex partner: Not on file    Emotionally abused: Not on file    Physically abused: Not on file    Forced sexual activity: Not on file  Other Topics Concern  . Not on file  Social History Narrative   Has her own house but her husband (separated) comes over daily.   32 ounces per day.   Right-handed.     PHYSICAL EXAM   Vitals:   12/06/17 1256  BP: 119/73  Pulse: 73  Weight: 247 lb (112 kg)  Height: _0  (1.626 m)    Not recorded      Body mass index is 42.4 kg/m.  PHYSICAL EXAMNIATION:  Gen: NAD, conversant, well nourised, obese, well groomed                     Cardiovascular: Regular rate rhythm, no peripheral edema, warm, nontender. Eyes: Conjunctivae clear without exudates or hemorrhage Neck: Supple, no carotid bruits. Pulmonary: Clear to auscultation bilaterally   NEUROLOGICAL EXAM:  MENTAL STATUS: Speech:    Speech is normal; fluent and spontaneous with normal comprehension.  Cognition:     Orientation to time, place and person     Normal recent and remote memory     Normal Attention span and concentration     Normal Language, naming, repeating,spontaneous speech     Fund of knowledge   CRANIAL NERVES: CN II: Visual fields are full to confrontation. Fundoscopic exam is normal with sharp discs and no vascular changes. Pupils are round equal and briskly reactive to light. CN III, IV, VI: extraocular movement are normal. No ptosis. CN V: Facial sensation is intact to pinprick in all 3 divisions bilaterally. Corneal responses are intact.  CN VII: Face is symmetric with normal eye closure and smile. CN VIII: Hearing is normal to rubbing fingers CN IX, X: Palate elevates symmetrically. Phonation is normal.  CN XI: Head turning and shoulder shrug are intact CN XII: Tongue is midline with normal movements and no  atrophy.  MOTOR: There is no pronator drift of out-stretched arms. Muscle bulk and tone are normal. Muscle strength is normal.  REFLEXES: Reflexes are 2+ and symmetric at the biceps, triceps, knees, and ankles. Plantar responses are flexor.  SENSORY: Intact to light touch, pinprick, positional sensation and vibratory sensation are intact in fingers and toes.  COORDINATION: Rapid alternating movements and fine finger movements are intact. There is no dysmetria on finger-to-nose and heel-knee-shin.    GAIT/STANCE: Posture is normal. Gait is steady with normal steps, base, arm swing, and turning. Heel and toe walking are normal. Tandem gait is normal.  Romberg is absent.   DIAGNOSTIC DATA (LABS, IMAGING, TESTING) - I reviewed patient records, labs, notes, testing and imaging myself where available.   ASSESSMENT AND PLAN  [name] is a 72 y.o. female    Persistent left-sided headaches,  No evidence of temporal arteritis  MRI of the brain showed small vessel disease,  Her headache has some migraine features,  responded very well to low-dose Imitrex 25 mg as needed, but she worried about the potential long-term side effects including vasoconstriction side effect  Continue Topamax 200 mg 2 tablets every night xr  Botox prior authorization as migraine prevention  NSAIDs as needed  Marcial Pacas, M.D. Ph.D.  [facility] [address] [place], [place] [address] Ph: [phone] Fax: [phone]  CC: Clydell Hakim, MD, Leonard Downing, MD  Addendum: MRI of the brain report from Oct 24, 2017, [place], no evidence of intracranial hemorrhage, no acute abnormality, mild small vessel disease, trace amount of fluid in the right mastoid cells,  We have MRI CD on file

## 2017-12-12 NOTE — Telephone Encounter (Signed)
I called to schedule the patient for her injection, she did not answer so I left a VM asking her to call me back.   I called the [employer] listed on the patient account to check eligibility and authorization requirements. I spoke with [place] [id number]  Benefits, No deductible, 5,900 out of pocket. Provider is an in network provider. 20 percent co insurance for in network for the drug.  Ref# Ruby M. 12/12/17 @8 :31.

## 2017-12-13 ENCOUNTER — Telehealth: Payer: Self-pay | Admitting: Neurology

## 2017-12-13 NOTE — Telephone Encounter (Signed)
I called the patient and scheduled her for August 7th. She was unhappy with this time and has stated that she does not want to wait a month to get these. She would like to know if there is any way she can be moved to an earlier time. Please call and advise.

## 2017-12-13 NOTE — Telephone Encounter (Signed)
Pt returning Danielle's call would like a call back at [phone]

## 2017-12-13 NOTE — Telephone Encounter (Signed)
Spoke to patient - her appt has been moved to 01/10/18 at 9am.  She is aware to arrive early for check-in.

## 2017-12-17 NOTE — Telephone Encounter (Signed)
Noted, thank you

## 2017-12-24 NOTE — Telephone Encounter (Signed)
I called the patient's [employer]BS plan and spoke with Joy to check authorization requirements for the codes [phone] and [phone]. According to [place] is required for either codes. Reference # [id number] 12-28-17. DW

## 2017-12-24 NOTE — Telephone Encounter (Signed)
Error

## 2017-12-26 ENCOUNTER — Ambulatory Visit: Payer: Medicare Other | Admitting: Neurology

## 2018-01-02 DIAGNOSIS — M47812 Spondylosis without myelopathy or radiculopathy, cervical region: Secondary | ICD-10-CM | POA: Diagnosis not present

## 2018-01-02 DIAGNOSIS — M25511 Pain in right shoulder: Secondary | ICD-10-CM | POA: Diagnosis not present

## 2018-01-10 ENCOUNTER — Ambulatory Visit (INDEPENDENT_AMBULATORY_CARE_PROVIDER_SITE_OTHER): Payer: Medicare Other | Admitting: Neurology

## 2018-01-10 ENCOUNTER — Encounter

## 2018-01-10 ENCOUNTER — Encounter: Payer: Self-pay | Admitting: Neurology

## 2018-01-10 VITALS — BP 101/63 | HR 68 | Ht 64.0 in | Wt 246.0 lb

## 2018-01-10 DIAGNOSIS — G43709 Chronic migraine without aura, not intractable, without status migrainosus: Secondary | ICD-10-CM | POA: Diagnosis not present

## 2018-01-10 DIAGNOSIS — IMO0002 Reserved for concepts with insufficient information to code with codable children: Secondary | ICD-10-CM

## 2018-01-10 MED ORDER — ONABOTULINUMTOXINA 100 UNITS IJ SOLR
200.0000 [IU] | Freq: Once | INTRAMUSCULAR | Status: AC
  Administered 2018-01-10: 200 [IU] via INTRAMUSCULAR

## 2018-01-10 NOTE — Addendum Note (Signed)
Addended by: Marcial Pacas on: 01/10/2018 10:04 AM   Modules accepted: Orders

## 2018-01-10 NOTE — Progress Notes (Signed)
**  Botox 100 units x 2 vials, NDC 4171-2787-18, Lot [id number], Exp 07/2020, office supply.//mck,rn**

## 2018-01-10 NOTE — Progress Notes (Signed)
PATIENT: [name] DOB: 1945/08/07  Chief Complaint  Patient presents with  . Migraine    Botox 100 units x 2 vials - office supply     HISTORICAL  [name] is a 72 years old female, seen in refer by her pain management doctor Clydell Hakim for evaluation of neck pain, frequent headaches, potential Botox injection, her primary care physician is Dr. Arelia Sneddon, [facility], initial evaluation was on Oct 23, 2017.  She has past medical history of hyperlipidemia, cervical decompression surgery in the past.  Since 2015, she presented with frequent headaches, at that time she also has significant neck pain, eventually had anterior cervical decompression with fusion of C3-4, C5-6, and C6 and 7 by Dr. Ellene Route in August 2017, surgery did help her neck pain, but when she gradually tapering off the posterior surgical pain medications, she began to notice recurrence of her headaches.  Gradually getting worse over the past few years, now she has constant left-sided neck pain, radiating to left occipital left parietal temporal region, involving her left jaw, upper tooth constant deep achy pain, over the years, she has been taking 2 tablets of extra strength Tylenol on a regular basis, usually she consume 12 to14 tablets of double strength a day, she also tried multiple over-the-counter CBD oil, chiropractor adjustment, without helping her symptoms.  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  She was seen by pain management Dr. Luan Pulling, diagnosed with cervicogenic pain, was given a trial of indomethacin twice a day for 6 days, with mild improvement, but has recurrent headaches once the medicine was stopped.  She has such  constant deep achy pain, also complains of chewing difficulty, left upper tooth pain, even was evaluated by dentist in the past without clear etiology found, she has been taking Topamax titrating dose, currently topiramate ER 200 mg 2 tablets every night, which have provided some help,  UPDATE December 06 2017: We have personally reviewed MRI of the brain from [facility] in May 2019, there is evidence of periventricular white matter small vessel disease, also involving pons, no acute abnormality,  She has tried Imitrex 25 mg as needed, which has been very helpful for headaches, but she used up 15 tablets within a short period of time, when she read about the potential side effect including stroke, she is very upset, decided not to take it anymore, continue with 15 tablets of Imitrex, after discussed with patient, we decided to discard it into disposal bin  Over the past few weeks, she has been taking CBD oil, which has been helpful,,  But she could not afford her long-term use, preferred to proceed with Botox injection as migraine prevention  Laboratory evaluation initially showed elevated C-reactive protein 65, correlate with her urinary tract infection, much improved to 7.9 at repeat laboratory evaluation without treatment, normal ESR, there is no evidence of temporal arteritis  Normal B12, TSH, negative ANA, A1c was 5.5,  UPDATE January 10 2018: She complains of frequent left side headaches, lasting for 2-3 hours, light noise sensitive, she is now taking CBD oil, tylenol 2 tabs bid daily x 2 weeks,   She came in for her first Botox injection as migraine prevention, she is also taking Trokendi XR 200 mg  2 tablets every night,  REVIEW OF SYSTEMS: Full 14 system review of systems performed and notable only for as above ALLERGIES: Allergies  Allergen Reactions  . Nickel Hives and Rash    HOME MEDICATIONS: Current Outpatient Medications  Medication Sig Dispense Refill  . Cholecalciferol  (VITAMIN D3) 5000 units CAPS Take 5,000 Units by mouth daily.     . citalopram (CELEXA) 40 MG tablet Take 40 mg by mouth daily.      . fexofenadine (ALLEGRA) 180 MG tablet Take 180 mg by mouth at bedtime.     . fluticasone (FLONASE) 50 MCG/ACT nasal spray Place 2 sprays into the nose daily.      . meloxicam (MOBIC) 15 MG tablet Take 15 mg by mouth daily.    . methylphenidate (RITALIN) 10 MG tablet Take 10 mg by mouth daily as needed (focus).     . nortriptyline (PAMELOR) 10 MG capsule Take 2 capsules (20 mg total) by mouth at bedtime. 60 capsule 11  . OnabotulinumtoxinA (BOTOX IM) Inject 200 Units into the muscle every 3 (three) months.    . ranitidine (ZANTAC) 150 MG tablet Take 150 mg by mouth daily.    . simvastatin (ZOCOR) 20 MG tablet Take 20 mg by mouth daily.     . tizanidine (ZANAFLEX) 2 MG capsule Take 1 capsule (2 mg total) by mouth 3 (three) times daily. 60 capsule 6  . Topiramate ER (TROKENDI XR) 200 MG CP24 Take 400 mg by mouth 2 (two) times daily.    Marland Kitchen triamterene-hydrochlorothiazide (MAXZIDE-25) 37.5-25 MG tablet Take 1 tablet by mouth daily.  0  . UNABLE TO FIND CBD oil '5000mg'$  Twice a day for pain.     No current facility-administered medications for this visit.     PAST MEDICAL HISTORY: Past Medical History:  Diagnosis Date  . ADD (attention deficit disorder)   . Anxiety   . Arthritis    os  . GERD (gastroesophageal reflux disease)    barretts  . Headache   . High cholesterol   . Neck pain   . Obesity   . Personal history of radiation therapy 2005  . Sleep apnea    cpap   last sleep study> 3 yrs  . Swelling of both ankles     PAST SURGICAL HISTORY: Past Surgical History:  Procedure Laterality Date  . ABDOMINAL HYSTERECTOMY    . ANTERIOR CERVICAL DECOMP/DISCECTOMY FUSION N/A 02/15/2016   Procedure: Cervical three four-Cervical five-six, Cervical six-seven Anterior cervical decompression/diskectomy/fusion;  Surgeon: Kristeen Miss, MD;  Location: [facility];   Service: Neurosurgery;  Laterality: N/A;  . bicept surgery    . BREAST LUMPECTOMY    . CHOLECYSTECTOMY    . JOINT REPLACEMENT    . PAROTID GLAND TUMOR EXCISION    . TONSILLECTOMY    . TOTAL KNEE ARTHROPLASTY  07/19/2012   Procedure: TOTAL KNEE ARTHROPLASTY;  Surgeon: Yvette Rack., MD;  Location: [facility];  Service: Orthopedics;  Laterality: Right;  right total knee arthroplasty  . TUBAL LIGATION      FAMILY HISTORY: Family History  Problem Relation Age of Onset  . Breast cancer Maternal Aunt   . Breast cancer Paternal Aunt   . Lung cancer Mother        age 50  . Colon cancer Father        age 101    SOCIAL HISTORY:  Social History   Socioeconomic History  . Marital status: Married    Spouse name: Not on  file  . Number of children: 2  . Years of education: some college  . Highest education level: Not on file  Occupational History  . Occupation: Retired  Scientific laboratory technician  . Financial resource strain: Not on file  . Food insecurity:    Worry: Not on file    Inability: Not on file  . Transportation needs:    Medical: Not on file    Non-medical: Not on file  Tobacco Use  . Smoking status: Former Smoker    Last attempt to quit: 07/11/1997    Years since quitting: 20.5  . Smokeless tobacco: Never Used  Substance and Sexual Activity  . Alcohol use: Not Currently  . Drug use: No  . Sexual activity: Not on file  Lifestyle  . Physical activity:    Days per week: Not on file    Minutes per session: Not on file  . Stress: Not on file  Relationships  . Social connections:    Talks on phone: Not on file    Gets together: Not on file    Attends religious service: Not on file    Active member of club or organization: Not on file    Attends meetings of clubs or organizations: Not on file    Relationship status: Not on file  . Intimate partner violence:    Fear of current or ex partner: Not on file    Emotionally abused: Not on file    Physically abused: Not on file     Forced sexual activity: Not on file  Other Topics Concern  . Not on file  Social History Narrative   Has her own house but her husband (separated) comes over daily.   32 ounces per day.   Right-handed.     PHYSICAL EXAM   Vitals:   01/10/18 0840  BP: 101/63  Pulse: 68  Weight: 246 lb (111.6 kg)  Height: '5\' 4"'$  (1.626 m)    Not recorded      Body mass index is 42.23 kg/m.  PHYSICAL EXAMNIATION:  Gen: NAD, conversant, well nourised, obese, well groomed                     Cardiovascular: Regular rate rhythm, no peripheral edema, warm, nontender. Eyes: Conjunctivae clear without exudates or hemorrhage Neck: Supple, no carotid bruits. Pulmonary: Clear to auscultation bilaterally   NEUROLOGICAL EXAM:  MENTAL STATUS: Speech:    Speech is normal; fluent and spontaneous with normal comprehension.  Cognition:     Orientation to time, place and person     Normal recent and remote memory     Normal Attention span and concentration     Normal Language, naming, repeating,spontaneous speech     Fund of knowledge   CRANIAL NERVES: CN II: Visual fields are full to confrontation. Fundoscopic exam is normal with sharp discs and no vascular changes. Pupils are round equal and briskly reactive to light. CN III, IV, VI: extraocular movement are normal. No ptosis. CN V: Facial sensation is intact to pinprick in all 3 divisions bilaterally. Corneal responses are intact.  CN VII: Face is symmetric with normal eye closure and smile. CN VIII: Hearing is normal to rubbing fingers CN IX, X: Palate elevates symmetrically. Phonation is normal. CN XI: Head turning and shoulder shrug are intact CN XII: Tongue is midline with normal movements and no atrophy.  MOTOR: There is no pronator drift of out-stretched arms. Muscle bulk and tone are normal. Muscle strength is normal.  REFLEXES: Reflexes are 2+ and symmetric at the biceps, triceps, knees, and ankles. Plantar responses are  flexor.  SENSORY: Intact to light touch, pinprick, positional sensation and vibratory sensation are intact in fingers and toes.  COORDINATION: Rapid alternating movements and fine finger movements are intact. There is no dysmetria on finger-to-nose and heel-knee-shin.    GAIT/STANCE: Posture is normal. Gait is steady with normal steps, base, arm swing, and turning. Heel and toe walking are normal. Tandem gait is normal.  Romberg is absent.   DIAGNOSTIC DATA (LABS, IMAGING, TESTING) - I reviewed patient records, labs, notes, testing and imaging myself where available.   ASSESSMENT AND PLAN  TOMASINA KEASLING is a 72 y.o. female    Persistent left-sided headaches,  Need to rule out temporal arteritis, laboratory evaluation including ESR C-reactive protein,  MRI of brain  No significant improvement with trial of indomethacin treatment,  Continue current dose of topiramate xr 200 mg 2 tablets every night, add on nortriptyline 10 mg titrating to 20 mg every night as preventive medications,  Stop daily over-the-counter medication use,  Imitrex 25 mg as needed    Marcial Pacas, M.D. Ph.D.  [facility] [address], [place],  [address] Ph: [phone] Fax: [phone]  CC: Clydell Hakim, MD, Leonard Downing, MD  Addendum: MRI of the brain report from Oct 24, 2017, [facility], no evidence of intracranial hemorrhage, no acute abnormality, mild small vessel disease, trace amount of fluid in the right mastoid cells,      PATIENT: [name] DOB: [birth year]  Chief Complaint  Patient presents with  . Migraine    Botox 100 units x 2 vials - office supply     HISTORICAL  [name] is a 72 years old female, seen in refer by her pain management doctor Clydell Hakim for evaluation of neck pain, frequent headaches, potential Botox injection, her primary care physician is Dr. Arelia Sneddon, [facility], initial evaluation was on Oct 23, 2017.  She has past medical history of hyperlipidemia, cervical decompression surgery in the past.  Since 2015, he presented with frequent headaches, at that time she also has significant neck pain, eventually had anterior cervical decompression with fusion of C3-4, C5-6, and C6 and 7 by Dr. Ellene Route in August 2017, surgery did help her neck pain, but when she gradually tapering off the posterior surgical pain medications, she began to notice recurrence of her headaches.  Gradually getting worse over the past few years, now she has constant left-sided neck pain, radiating to left occipital left parietal temporal region, involving her left jaw, upper tooth constant deep achy pain, over the years, she has been taking 2 tablets of extra strength Tylenol on a regular basis, usually she consume 12 to14 tablets of double strength a day, she also tried multiple over-the-counter CBD oil, chiropractor adjustment, without helping her symptoms.  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  She was seen by pain management Dr. Luan Pulling, diagnosed with cervicogenic pain, was giving a trial of indomethacin twice a day for 6 days, with mild improvement, but has recurrent headaches once the medicine was stopped.  She has such constant deep achy pain, also complains of chewing difficulty, left upper tooth pain, even was evaluated  by dentist in the past without clear etiology found, she has been taking Topamax titrating dose, currently topiramate ER 200 mg 2 tablets every night, which have provided some help,  REVIEW OF SYSTEMS: Full 14 system review of systems performed and notable only for headache, snoring, incontinence  ALLERGIES: Allergies   Allergen Reactions  . Nickel Hives and Rash    HOME MEDICATIONS: Current Outpatient Medications  Medication Sig Dispense Refill  . Cholecalciferol (VITAMIN D3) 5000 units CAPS Take 5,000 Units by mouth daily.     . citalopram (CELEXA) 40 MG tablet Take 40 mg by mouth daily.      . fexofenadine (ALLEGRA) 180 MG tablet Take 180 mg by mouth at bedtime.     . fluticasone (FLONASE) 50 MCG/ACT nasal spray Place 2 sprays into the nose daily.      . meloxicam (MOBIC) 15 MG tablet Take 15 mg by mouth daily.    . methylphenidate (RITALIN) 10 MG tablet Take 10 mg by mouth daily as needed (focus).     . nortriptyline (PAMELOR) 10 MG capsule Take 2 capsules (20 mg total) by mouth at bedtime. 60 capsule 11  . OnabotulinumtoxinA (BOTOX IM) Inject 200 Units into the muscle every 3 (three) months.    . ranitidine (ZANTAC) 150 MG tablet Take 150 mg by mouth daily.    . simvastatin (ZOCOR) 20 MG tablet Take 20 mg by mouth daily.     . tizanidine (ZANAFLEX) 2 MG capsule Take 1 capsule (2 mg total) by mouth 3 (three) times daily. 60 capsule 6  . Topiramate ER (TROKENDI XR) 200 MG CP24 Take 400 mg by mouth 2 (two) times daily.    Marland Kitchen triamterene-hydrochlorothiazide (MAXZIDE-25) 37.5-25 MG tablet Take 1 tablet by mouth daily.  0  . UNABLE TO FIND CBD oil '5000mg'$  Twice a day for pain.     No current facility-administered medications for this visit.     PAST MEDICAL HISTORY: Past Medical History:  Diagnosis Date  . ADD (attention deficit disorder)   . Anxiety   . Arthritis    os  . GERD (gastroesophageal reflux disease)    barretts  . Headache   . High cholesterol   . Neck pain   . Obesity   . Personal history of radiation therapy 2005  . Sleep apnea    cpap   last sleep study> 3 yrs  . Swelling of both ankles     PAST SURGICAL HISTORY: Past Surgical History:  Procedure Laterality Date  . ABDOMINAL HYSTERECTOMY    . ANTERIOR CERVICAL DECOMP/DISCECTOMY FUSION N/A 02/15/2016   Procedure: Cervical  three four-Cervical five-six, Cervical six-seven Anterior cervical decompression/diskectomy/fusion;  Surgeon: Kristeen Miss, MD;  Location: [facility];  Service: Neurosurgery;  Laterality: N/A;  . bicept surgery    . BREAST LUMPECTOMY    . CHOLECYSTECTOMY    . JOINT REPLACEMENT    . PAROTID GLAND TUMOR EXCISION    . TONSILLECTOMY    . TOTAL KNEE ARTHROPLASTY  07/19/2012   Procedure: TOTAL KNEE ARTHROPLASTY;  Surgeon: Yvette Rack., MD;  Location: [facility];  Service: Orthopedics;  Laterality: Right;  right total knee arthroplasty  . TUBAL LIGATION      FAMILY HISTORY: Family History  Problem Relation Age of Onset  . Breast cancer Maternal Aunt   . Breast cancer Paternal Aunt   . Lung cancer Mother        age 43  . Colon cancer Father  age 51    SOCIAL HISTORY:  Social History   Socioeconomic History  . Marital status: Married    Spouse name: Not on file  . Number of children: 2  . Years of education: some college  . Highest education level: Not on file  Occupational History  . Occupation: Retired  Scientific laboratory technician  . Financial resource strain: Not on file  . Food insecurity:    Worry: Not on file    Inability: Not on file  . Transportation needs:    Medical: Not on file    Non-medical: Not on file  Tobacco Use  . Smoking status: Former Smoker    Last attempt to quit: 07/11/1997    Years since quitting: 20.5  . Smokeless tobacco: Never Used  Substance and Sexual Activity  . Alcohol use: Not Currently  . Drug use: No  . Sexual activity: Not on file  Lifestyle  . Physical activity:    Days per week: Not on file    Minutes per session: Not on file  . Stress: Not on file  Relationships  . Social connections:    Talks on phone: Not on file    Gets together: Not on file    Attends religious service: Not on file    Active member of club or organization: Not on file    Attends meetings of clubs or organizations: Not on file    Relationship status: Not on file  .  Intimate partner violence:    Fear of current or ex partner: Not on file    Emotionally abused: Not on file    Physically abused: Not on file    Forced sexual activity: Not on file  Other Topics Concern  . Not on file  Social History Narrative   Has her own house but her husband (separated) comes over daily.   32 ounces per day.   Right-handed.     PHYSICAL EXAM   Vitals:   01/10/18 0840  BP: 101/63  Pulse: 68  Weight: 246 lb (111.6 kg)  Height: '5\' 4"'$  (1.626 m)    Not recorded      Body mass index is 42.23 kg/m.  PHYSICAL EXAMNIATION:  Gen: NAD, conversant, well nourised, obese, well groomed                     Cardiovascular: Regular rate rhythm, no peripheral edema, warm, nontender. Eyes: Conjunctivae clear without exudates or hemorrhage Neck: Supple, no carotid bruits. Pulmonary: Clear to auscultation bilaterally   NEUROLOGICAL EXAM:  MENTAL STATUS: Speech:    Speech is normal; fluent and spontaneous with normal comprehension.  Cognition:     Orientation to time, place and person     Normal recent and remote memory     Normal Attention span and concentration     Normal Language, naming, repeating,spontaneous speech     Fund of knowledge   CRANIAL NERVES: CN II: Visual fields are full to confrontation. Fundoscopic exam is normal with sharp discs and no vascular changes. Pupils are round equal and briskly reactive to light. CN III, IV, VI: extraocular movement are normal. No ptosis. CN V: Facial sensation is intact to pinprick in all 3 divisions bilaterally. Corneal responses are intact.  CN VII: Face is symmetric with normal eye closure and smile. CN VIII: Hearing is normal to rubbing fingers CN IX, X: Palate elevates symmetrically. Phonation is normal. CN XI: Head turning and shoulder shrug are intact CN XII: Tongue is midline  with normal movements and no atrophy.  MOTOR: There is no pronator drift of out-stretched arms. Muscle bulk and tone are normal.  Muscle strength is normal.  REFLEXES: Reflexes are 2+ and symmetric at the biceps, triceps, knees, and ankles. Plantar responses are flexor.  SENSORY: Intact to light touch, pinprick, positional sensation and vibratory sensation are intact in fingers and toes.  COORDINATION: Rapid alternating movements and fine finger movements are intact. There is no dysmetria on finger-to-nose and heel-knee-shin.    GAIT/STANCE: Posture is normal. Gait is steady with normal steps, base, arm swing, and turning. Heel and toe walking are normal. Tandem gait is normal.  Romberg is absent.   DIAGNOSTIC DATA (LABS, IMAGING, TESTING) - I reviewed patient records, labs, notes, testing and imaging myself where available.   ASSESSMENT AND PLAN  [name] is a 72 y.o. female    Persistent left-sided headaches,  No evidence of temporal arteritis  MRI of the brain showed small vessel disease,  Her headache has some migraine features,  responded very well to low-dose Imitrex 25 mg as needed, but she worried about the potential long-term side effects including vasoconstriction side effect, after discussion, we have discarded all her imitrex, called pharmacy to cancel previous order,  Decrease Topamax xr 200 mg to 1 tablet every night as migraine prevention  Botox injection for chronic migraine prevention, injection was performed according to [place] protocol,  5 units of Botox was injected into each side, for 31 injection sites, total of 155 units  Bilateral frontalis 4 injection sites Bilateral corrugate 2 injection sites Procerus 1 injection sites. Bilateral temporalis 8 injection sites Bilateral occipitalis 6 injection sites Bilateral cervical paraspinals 4 injection sites Bilateral upper trapezius 6 injection sites  Extra 45 unites were injected into left temporal, parietal, occipital region    Marcial Pacas, M.D. Ph.D.  [facility] [address] [place],  [place] [address] Ph: [phone] Fax: [phone]  CC: Clydell Hakim, MD, Leonard Downing, MD

## 2018-01-23 ENCOUNTER — Ambulatory Visit: Payer: Medicare Other | Admitting: Neurology

## 2018-02-28 DIAGNOSIS — E785 Hyperlipidemia, unspecified: Secondary | ICD-10-CM | POA: Diagnosis not present

## 2018-02-28 DIAGNOSIS — Z049 Encounter for examination and observation for unspecified reason: Secondary | ICD-10-CM | POA: Diagnosis not present

## 2018-03-07 DIAGNOSIS — R51 Headache: Secondary | ICD-10-CM | POA: Diagnosis not present

## 2018-03-07 DIAGNOSIS — Z23 Encounter for immunization: Secondary | ICD-10-CM | POA: Diagnosis not present

## 2018-04-03 NOTE — Telephone Encounter (Signed)
I called the patients insurance to inquired about pre cert requirements.   [name]. 04/03/18 @9 :30. DW

## 2018-04-10 ENCOUNTER — Encounter: Payer: Self-pay | Admitting: Neurology

## 2018-04-10 ENCOUNTER — Ambulatory Visit (INDEPENDENT_AMBULATORY_CARE_PROVIDER_SITE_OTHER): Payer: Medicare Other | Admitting: Neurology

## 2018-04-10 VITALS — BP 108/62 | HR 68 | Ht 64.0 in | Wt 246.5 lb

## 2018-04-10 DIAGNOSIS — IMO0002 Reserved for concepts with insufficient information to code with codable children: Secondary | ICD-10-CM

## 2018-04-10 DIAGNOSIS — G43709 Chronic migraine without aura, not intractable, without status migrainosus: Secondary | ICD-10-CM | POA: Diagnosis not present

## 2018-04-10 MED ORDER — ONABOTULINUMTOXINA 100 UNITS IJ SOLR
200.0000 [IU] | Freq: Once | INTRAMUSCULAR | Status: AC
  Administered 2018-04-10: 200 [IU] via INTRAMUSCULAR

## 2018-04-10 MED ORDER — BUTALBITAL-APAP-CAFFEINE 50-325-40 MG PO TABS: 1.0000 | ORAL_TABLET | Freq: Four times a day (QID) | ORAL | 3 refills | Status: DC | PRN

## 2018-04-10 NOTE — Progress Notes (Addendum)
PATIENT: [name] DOB: 1945/08/07  Chief Complaint  Patient presents with  . Migraine    Botox 100 units x 2 vials - office supply     HISTORICAL  CIANNA Wilson is a 72 years old female, seen in refer by her pain management doctor Clydell Hakim for evaluation of neck pain, frequent headaches, potential Botox injection, her primary care physician is Dr. Arelia Sneddon, [facility], initial evaluation was on Oct 23, 2017.  She has past medical history of hyperlipidemia, cervical decompression surgery in the past.  Since 2015, she presented with frequent headaches, at that time she also has significant neck pain, eventually had anterior cervical decompression with fusion of C3-4, C5-6, and C6 and 7 by Dr. Ellene Route in August 2017, surgery did help her neck pain, but when she gradually tapering off the posterior surgical pain medications, she began to notice recurrence of her headaches.  Gradually getting worse over the past few years, now she has constant left-sided neck pain, radiating to left occipital left parietal temporal region, involving her left jaw, upper tooth constant deep achy pain, over the years, she has been taking 2 tablets of extra strength Tylenol on a regular basis, usually she consume 12 to14 tablets of double strength a day, she also tried multiple over-the-counter CBD oil, chiropractor adjustment, without helping her symptoms.  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  She was seen by pain management Dr. Luan Pulling, diagnosed with cervicogenic pain, was given a trial of indomethacin twice a day for 6 days, with mild improvement, but has recurrent headaches once the medicine was stopped.  She has such  constant deep achy pain, also complains of chewing difficulty, left upper tooth pain, even was evaluated by dentist in the past without clear etiology found, she has been taking Topamax titrating dose, currently topiramate ER 200 mg 2 tablets every night, which have provided some help,  UPDATE December 06 2017: We have personally reviewed MRI of the brain from [facility] in May 2019, there is evidence of periventricular white matter small vessel disease, also involving pons, no acute abnormality,  She has tried Imitrex 25 mg as needed, which has been very helpful for headaches, but she used up 15 tablets within a short period of time, when she read about the potential side effect including stroke, she is very upset, decided not to take it anymore, continue with 15 tablets of Imitrex, after discussed with patient, we decided to discard it into disposal bin  Over the past few weeks, she has been taking CBD oil, which has been helpful,,  But she could not afford her long-term use, preferred to proceed with Botox injection as migraine prevention  Laboratory evaluation initially showed elevated C-reactive protein 65, correlate with her urinary tract infection, much improved to 7.9 at repeat laboratory evaluation without treatment, normal ESR, there is no evidence of temporal arteritis  Normal B12, TSH, negative ANA, A1c was 5.5,  UPDATE January 10 2018: She complains of frequent left side headaches, lasting for 2-3 hours, light noise sensitive, she is now taking CBD oil, tylenol 2 tabs bid daily x 2 weeks,   She came in for her first Botox injection as migraine prevention, she is also taking Trokendi XR 200 mg  2 tablets every night,  UPDATE Apr 10 2018: She responded very well to her first Botox injection in July 2019, she has much less frequent and less severe headaches, headache is mainly on the left side,    REVIEW OF SYSTEMS: Full 14 system review of systems performed and notable only for as  above ALLERGIES: Allergies  Allergen Reactions  . Nickel Hives and Rash    HOME MEDICATIONS: Current Outpatient Medications  Medication Sig Dispense Refill  . Cholecalciferol (VITAMIN D3) 5000 units CAPS Take 5,000 Units by mouth daily.     . citalopram (CELEXA) 40 MG tablet Take 40 mg by mouth daily.      . fexofenadine (ALLEGRA) 180 MG tablet Take 180 mg by mouth at bedtime.     . fluticasone (FLONASE) 50 MCG/ACT nasal spray Place 2 sprays into the nose daily.      . meloxicam (MOBIC) 15 MG tablet Take 15 mg by mouth daily.    . methylphenidate (RITALIN) 10 MG tablet Take 10 mg by mouth daily as needed (focus).     . nortriptyline (PAMELOR) 10 MG capsule Take 2 capsules (20 mg total) by mouth at bedtime. 60 capsule 11  . OnabotulinumtoxinA (BOTOX IM) Inject 200 Units into the muscle every 3 (three) months.    . ranitidine (ZANTAC) 150 MG tablet Take 150 mg by mouth daily.    . simvastatin (ZOCOR) 20 MG tablet Take 20 mg by mouth daily.     . tizanidine (ZANAFLEX) 2 MG capsule Take 1 capsule (2 mg total) by mouth 3 (three) times daily. 60 capsule 6  . Topiramate ER (TROKENDI XR) 200 MG CP24 Take 400 mg by mouth 2 (two) times daily.    Marland Kitchen triamterene-hydrochlorothiazide (MAXZIDE-25) 37.5-25 MG tablet Take 1 tablet by mouth daily.  0  . UNABLE TO FIND CBD oil 5016m Twice a day for pain.     No current facility-administered medications for this visit.     PAST MEDICAL HISTORY: Past Medical History:  Diagnosis Date  . ADD (attention deficit disorder)   . Anxiety   . Arthritis    os  . GERD (gastroesophageal reflux disease)    barretts  . Headache   . High cholesterol   . Neck pain   . Obesity   . Personal history of radiation therapy 2005  . Sleep apnea    cpap   last sleep study> 3 yrs  . Swelling of both ankles     PAST SURGICAL HISTORY: Past Surgical History:  Procedure Laterality Date  . ABDOMINAL HYSTERECTOMY    . ANTERIOR CERVICAL DECOMP/DISCECTOMY FUSION N/A  02/15/2016   Procedure: Cervical three four-Cervical five-six, Cervical six-seven Anterior cervical decompression/diskectomy/fusion;  Surgeon: HKristeen Miss MD;  Location: [facility];  Service: Neurosurgery;  Laterality: N/A;  . bicept surgery    . BREAST LUMPECTOMY    . CHOLECYSTECTOMY    . JOINT REPLACEMENT    . PAROTID GLAND TUMOR EXCISION    . TONSILLECTOMY    . TOTAL KNEE ARTHROPLASTY  07/19/2012   Procedure: TOTAL KNEE ARTHROPLASTY;  Surgeon: WYvette Rack, MD;  [place] [facility]  Service: Orthopedics;  Laterality: Right;  right total knee arthroplasty  . TUBAL LIGATION      FAMILY HISTORY: Family History  Problem Relation Age of Onset  . Breast cancer Maternal Aunt   . Breast cancer Paternal Aunt   . Lung cancer Mother        age 71 . Colon  cancer Father        age 37    SOCIAL HISTORY:  Social History   Socioeconomic History  . Marital status: Married    Spouse name: Not on file  . Number of children: 2  . Years of education: some college  . Highest education level: Not on file  Occupational History  . Occupation: Retired  Scientific laboratory technician  . Financial resource strain: Not on file  . Food insecurity:    Worry: Not on file    Inability: Not on file  . Transportation needs:    Medical: Not on file    Non-medical: Not on file  Tobacco Use  . Smoking status: Former Smoker    Last attempt to quit: 07/11/1997    Years since quitting: 20.7  . Smokeless tobacco: Never Used  Substance and Sexual Activity  . Alcohol use: Not Currently  . Drug use: No  . Sexual activity: Not on file  Lifestyle  . Physical activity:    Days per week: Not on file    Minutes per session: Not on file  . Stress: Not on file  Relationships  . Social connections:    Talks on phone: Not on file    Gets together: Not on file    Attends religious service: Not on file    Active member of club or organization: Not on file    Attends meetings of clubs or organizations: Not on file     Relationship status: Not on file  . Intimate partner violence:    Fear of current or ex partner: Not on file    Emotionally abused: Not on file    Physically abused: Not on file    Forced sexual activity: Not on file  Other Topics Concern  . Not on file  Social History Narrative   Has her own house but her husband (separated) comes over daily.   32 ounces per day.   Right-handed.     PHYSICAL EXAM   Vitals:   04/10/18 1026  BP: 108/62  Pulse: 68  Weight: 246 lb 8 oz (111.8 kg)  Height: 5' 4" (1.626 m)    Not recorded      Body mass index is 42.31 kg/m.  PHYSICAL EXAMNIATION:  Gen: NAD, conversant, well nourised, obese, well groomed                     Cardiovascular: Regular rate rhythm, no peripheral edema, warm, nontender. Eyes: Conjunctivae clear without exudates or hemorrhage Neck: Supple, no carotid bruits. Pulmonary: Clear to auscultation bilaterally   NEUROLOGICAL EXAM:  MENTAL STATUS: Speech:    Speech is normal; fluent and spontaneous with normal comprehension.  Cognition:     Orientation to time, place and person     Normal recent and remote memory     Normal Attention span and concentration     Normal Language, naming, repeating,spontaneous speech     Fund of knowledge   CRANIAL NERVES: CN II: Visual fields are full to confrontation. Fundoscopic exam is normal with sharp discs and no vascular changes. Pupils are round equal and briskly reactive to light. CN III, IV, VI: extraocular movement are normal. No ptosis. CN V: Facial sensation is intact to pinprick in all 3 divisions bilaterally. Corneal responses are intact.  CN VII: Face is symmetric with normal eye closure and smile. CN VIII: Hearing is normal to rubbing fingers CN IX, X: Palate elevates symmetrically. Phonation is normal. CN XI: Head  turning and shoulder shrug are intact CN XII: Tongue is midline with normal movements and no atrophy.  MOTOR: There is no pronator drift of  out-stretched arms. Muscle bulk and tone are normal. Muscle strength is normal.  REFLEXES: Reflexes are 2+ and symmetric at the biceps, triceps, knees, and ankles. Plantar responses are flexor.  SENSORY: Intact to light touch, pinprick, positional sensation and vibratory sensation are intact in fingers and toes.  COORDINATION: Rapid alternating movements and fine finger movements are intact. There is no dysmetria on finger-to-nose and heel-knee-shin.    GAIT/STANCE: Posture is normal. Gait is steady with normal steps, base, arm swing, and turning. Heel and toe walking are normal. Tandem gait is normal.  Romberg is absent.   DIAGNOSTIC DATA (LABS, IMAGING, TESTING) - I reviewed patient records, labs, notes, testing and imaging myself where available.   ASSESSMENT AND PLAN  LORELI DEBRULER is a 72 y.o. female    Persistent left-sided headaches,  Need to rule out temporal arteritis, laboratory evaluation including ESR C-reactive protein,  MRI of brain  No significant improvement with trial of indomethacin treatment,  Continue current dose of topiramate xr 200 mg 2 tablets every night, add on nortriptyline 10 mg titrating to 20 mg every night as preventive medications,  Stop daily over-the-counter medication use,  Imitrex 25 mg as needed    Marcial Pacas, M.D. Ph.D.  [facility] [address], [place], [place] [address] Ph: [phone] Fax: [phone]  CC: Clydell Hakim, MD, Leonard Downing, MD  Addendum: MRI of the brain report from Oct 24, 2017, [place], no evidence of intracranial hemorrhage, no acute abnormality, mild small vessel disease, trace amount of fluid in the right mastoid cells,      PATIENT: [name] DOB: 03-26-1946  Chief Complaint  Patient presents with  . Migraine    Botox 100 units x 2 vials - office supply     HISTORICAL  MISHAWN DIDION is a 72 years old female, seen in refer by her pain  management doctor Clydell Hakim for evaluation of neck pain, frequent headaches, potential Botox injection, her primary care physician is Dr. Arelia Sneddon, [facility], initial evaluation was on Oct 23, 2017.  She has past medical history of hyperlipidemia, cervical decompression surgery in the past.  Since 2015, he presented with frequent headaches, at that time she also has significant neck pain, eventually had anterior cervical decompression with fusion of C3-4, C5-6, and C6 and 7 by Dr. Ellene Route in August 2017, surgery did help her neck pain, but when she gradually tapering off the posterior surgical pain medications, she began to notice recurrence of her headaches.  Gradually getting worse over the past few years, now she has constant left-sided neck pain, radiating to left occipital left parietal temporal region, involving her left jaw, upper tooth constant deep achy pain, over the years, she has been taking 2 tablets of extra strength Tylenol on a regular basis, usually she consume 12 to14 tablets of double strength a day, she also tried multiple over-the-counter CBD oil, chiropractor adjustment, without helping her symptoms.  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  She was seen by pain management Dr. Luan Pulling, diagnosed with cervicogenic pain, was giving a trial of indomethacin  twice a day for 6 days, with mild improvement, but has recurrent headaches once the medicine was stopped.  She has such constant deep achy pain, also complains of chewing difficulty, left upper tooth pain, even was evaluated by dentist in the past without clear etiology found, she has been taking Topamax titrating dose, currently topiramate ER 200 mg 2 tablets  every night, which have provided some help,  REVIEW OF SYSTEMS: Full 14 system review of systems performed and notable only for headache, snoring, incontinence  ALLERGIES: Allergies  Allergen Reactions  . Nickel Hives and Rash    HOME MEDICATIONS: Current Outpatient Medications  Medication Sig Dispense Refill  . Cholecalciferol (VITAMIN D3) 5000 units CAPS Take 5,000 Units by mouth daily.     . citalopram (CELEXA) 40 MG tablet Take 40 mg by mouth daily.      . fexofenadine (ALLEGRA) 180 MG tablet Take 180 mg by mouth at bedtime.     . fluticasone (FLONASE) 50 MCG/ACT nasal spray Place 2 sprays into the nose daily.      . meloxicam (MOBIC) 15 MG tablet Take 15 mg by mouth daily.    . methylphenidate (RITALIN) 10 MG tablet Take 10 mg by mouth daily as needed (focus).     . nortriptyline (PAMELOR) 10 MG capsule Take 2 capsules (20 mg total) by mouth at bedtime. 60 capsule 11  . OnabotulinumtoxinA (BOTOX IM) Inject 200 Units into the muscle every 3 (three) months.    . ranitidine (ZANTAC) 150 MG tablet Take 150 mg by mouth daily.    . simvastatin (ZOCOR) 20 MG tablet Take 20 mg by mouth daily.     . tizanidine (ZANAFLEX) 2 MG capsule Take 1 capsule (2 mg total) by mouth 3 (three) times daily. 60 capsule 6  . Topiramate ER (TROKENDI XR) 200 MG CP24 Take 400 mg by mouth 2 (two) times daily.    Marland Kitchen triamterene-hydrochlorothiazide (MAXZIDE-25) 37.5-25 MG tablet Take 1 tablet by mouth daily.  0  . UNABLE TO FIND CBD oil 5048m Twice a day for pain.     No current facility-administered medications for this visit.     PAST MEDICAL HISTORY: Past Medical History:  Diagnosis Date  . ADD (attention deficit disorder)   . Anxiety   . Arthritis    os  . GERD (gastroesophageal reflux disease)    barretts  . Headache   . High cholesterol   . Neck pain   . Obesity   . Personal history of radiation therapy 2005  . Sleep apnea    cpap   last sleep study> 3 yrs  . Swelling of both ankles      PAST SURGICAL HISTORY: Past Surgical History:  Procedure Laterality Date  . ABDOMINAL HYSTERECTOMY    . ANTERIOR CERVICAL DECOMP/DISCECTOMY FUSION N/A 02/15/2016   Procedure: Cervical three four-Cervical five-six, Cervical six-seven Anterior cervical decompression/diskectomy/fusion;  Surgeon: HKristeen Miss MD;  Location: [facility];  Service: Neurosurgery;  Laterality: N/A;  . bicept surgery    . BREAST LUMPECTOMY    . CHOLECYSTECTOMY    . JOINT REPLACEMENT    . PAROTID GLAND TUMOR EXCISION    . TONSILLECTOMY    . TOTAL KNEE ARTHROPLASTY  07/19/2012   Procedure: TOTAL KNEE ARTHROPLASTY;  Surgeon: WYvette Rack, MD;  [place] [facility]  Service: Orthopedics;  Laterality: Right;  right total knee arthroplasty  . TUBAL LIGATION      FAMILY HISTORY: Family History  Problem Relation Age of Onset  .  Breast cancer Maternal Aunt   . Breast cancer Paternal Aunt   . Lung cancer Mother        age 75  . Colon cancer Father        age 42    SOCIAL HISTORY:  Social History   Socioeconomic History  . Marital status: Married    Spouse name: Not on file  . Number of children: 2  . Years of education: some college  . Highest education level: Not on file  Occupational History  . Occupation: Retired  Scientific laboratory technician  . Financial resource strain: Not on file  . Food insecurity:    Worry: Not on file    Inability: Not on file  . Transportation needs:    Medical: Not on file    Non-medical: Not on file  Tobacco Use  . Smoking status: Former Smoker    Last attempt to quit: 07/11/1997    Years since quitting: 20.7  . Smokeless tobacco: Never Used  Substance and Sexual Activity  . Alcohol use: Not Currently  . Drug use: No  . Sexual activity: Not on file  Lifestyle  . Physical activity:    Days per week: Not on file    Minutes per session: Not on file  . Stress: Not on file  Relationships  . Social connections:    Talks on phone: Not on file    Gets together: Not on file     Attends religious service: Not on file    Active member of club or organization: Not on file    Attends meetings of clubs or organizations: Not on file    Relationship status: Not on file  . Intimate partner violence:    Fear of current or ex partner: Not on file    Emotionally abused: Not on file    Physically abused: Not on file    Forced sexual activity: Not on file  Other Topics Concern  . Not on file  Social History Narrative   Has her own house but her husband (separated) comes over daily.   32 ounces per day.   Right-handed.     PHYSICAL EXAM   Vitals:   04/10/18 1026  BP: 108/62  Pulse: 68  Weight: 246 lb 8 oz (111.8 kg)  Height: 5' 4" (1.626 m)    Not recorded      Body mass index is 42.31 kg/m.  PHYSICAL EXAMNIATION:  Gen: NAD, conversant, well nourised, obese, well groomed                     Cardiovascular: Regular rate rhythm, no peripheral edema, warm, nontender. Eyes: Conjunctivae clear without exudates or hemorrhage Neck: Supple, no carotid bruits. Pulmonary: Clear to auscultation bilaterally   NEUROLOGICAL EXAM:  MENTAL STATUS: Speech:    Speech is normal; fluent and spontaneous with normal comprehension.  Cognition:     Orientation to time, place and person     Normal recent and remote memory     Normal Attention span and concentration     Normal Language, naming, repeating,spontaneous speech     Fund of knowledge   CRANIAL NERVES: CN II: Visual fields are full to confrontation. Fundoscopic exam is normal with sharp discs and no vascular changes. Pupils are round equal and briskly reactive to light. CN III, IV, VI: extraocular movement are normal. No ptosis. CN V: Facial sensation is intact to pinprick in all 3 divisions bilaterally. Corneal responses are intact.  CN VII:  Face is symmetric with normal eye closure and smile. CN VIII: Hearing is normal to rubbing fingers CN IX, X: Palate elevates symmetrically. Phonation is normal. CN XI:  Head turning and shoulder shrug are intact CN XII: Tongue is midline with normal movements and no atrophy.  MOTOR: There is no pronator drift of out-stretched arms. Muscle bulk and tone are normal. Muscle strength is normal.  REFLEXES: Reflexes are 2+ and symmetric at the biceps, triceps, knees, and ankles. Plantar responses are flexor.  SENSORY: Intact to light touch, pinprick, positional sensation and vibratory sensation are intact in fingers and toes.  COORDINATION: Rapid alternating movements and fine finger movements are intact. There is no dysmetria on finger-to-nose and heel-knee-shin.    GAIT/STANCE: Posture is normal. Gait is steady with normal steps, base, arm swing, and turning. Heel and toe walking are normal. Tandem gait is normal.  Romberg is absent.   DIAGNOSTIC DATA (LABS, IMAGING, TESTING) - I reviewed patient records, labs, notes, testing and imaging myself where available.   ASSESSMENT AND PLAN  JADENCE KINLAW is a 72 y.o. female    Persistent left-sided headaches,  No evidence of temporal arteritis  MRI of the brain showed small vessel disease,  Her headache has some migraine features,  responded very well to low-dose Imitrex 25 mg as needed, but she worried about the potential long-term side effects including vasoconstriction side effect, after discussion, we have discarded all her imitrex, called pharmacy to cancel previous order,  Decrease Topamax xr 200 mg to 1 tablet every night as migraine prevention  Botox injection for chronic migraine prevention, injection was performed according to [place] protocol,  5 units of Botox was injected into each side, for 31 injection sites, total of 155 units  Bilateral frontalis 4 injection sites Bilateral corrugate 2 injection sites Procerus 1 injection sites. Bilateral temporalis 8 injection sites Bilateral occipitalis 6 injection sites Bilateral cervical paraspinals 4 injection sites Bilateral upper trapezius  6 injection sites  Extra 45 unites were injected into left temporal, parietal, occipital region, and the left masseter muscle  Fioricet as needed  Marcial Pacas, M.D. Ph.D.  [facility] [address], [place], [place] [address] Ph: [phone] Fax: [phone]  CC: Clydell Hakim, MD, Leonard Downing, MD

## 2018-04-10 NOTE — Progress Notes (Signed)
**  Botox 100 units x 2 vials, NDC 3448-3015-99, Lot [id number], Exp 09/2020, office supply.//mck,rn**

## 2018-04-25 ENCOUNTER — Telehealth: Payer: Self-pay | Admitting: Neurology

## 2018-04-25 ENCOUNTER — Encounter: Payer: Self-pay | Admitting: *Deleted

## 2018-04-25 ENCOUNTER — Other Ambulatory Visit: Payer: Self-pay | Admitting: *Deleted

## 2018-04-25 MED ORDER — TRAMADOL HCL 50 MG PO TABS: 50.0000 mg | ORAL_TABLET | Freq: Two times a day (BID) | ORAL | 0 refills | Status: DC | PRN

## 2018-04-25 NOTE — Telephone Encounter (Signed)
Pt is having headaches which last all day.  Pt states from last Botox her left eye is partially closed and is affecting her vision.  Pt states she can live with the eye but not the headaches, please call

## 2018-04-25 NOTE — Telephone Encounter (Addendum)
I spoke to the patient.  She has tried Fioricet three times without much relief.  She is already using tizanidine 2mg  TID for other pain.  She has tried Tylenol sparingly.  She would like an alternate medication to help with her current migraine.  Per vo by Dr. Krista Blue, provide her with Tramadol 50mg , one tablet q12h prn, #15 x 0.  Also, try ice pack over left droopy eye to see if it will provide her some relief.  However, the droopiness will improve with  time.  Dr. Krista Blue will examine her first at the next visit, prior to the Botox being mixed.  Patient verbalized understanding of the above plan as was in agreement.

## 2018-06-10 ENCOUNTER — Telehealth: Payer: Self-pay | Admitting: *Deleted

## 2018-06-10 ENCOUNTER — Other Ambulatory Visit: Payer: Self-pay | Admitting: Neurology

## 2018-06-10 MED ORDER — GABAPENTIN 100 MG PO CAPS: 400.0000 mg | ORAL_CAPSULE | Freq: Two times a day (BID) | ORAL | 5 refills | Status: DC

## 2018-06-10 NOTE — Telephone Encounter (Signed)
Spoke to patient about the following:  1) Tizanidine 2mg  is non-formulary with her insurance plan but she does not wish to change meds.  She plans to pay with cash and a [email or web address] coupon.  2)  She does not wish to continue Botox.  She did not think it was helpful for her migraines.  Her appt next month has been changed to a follow up.  3) She tried her dog's gabapentin prescription.  States she searched the internet for an appropriate migraine dose.  She has been taking gabapentin 400mg  BID and feels it is doing an excellent job controlling her migraines.  She is asking for a prescription to continue this treatment.  Per vo by Dr. Krista Blue, okay to send in new rx for gabapentin 100mg , take 4 capsules BID.

## 2018-06-26 ENCOUNTER — Telehealth: Payer: Self-pay | Admitting: Neurology

## 2018-06-26 DIAGNOSIS — R51 Headache: Secondary | ICD-10-CM | POA: Diagnosis not present

## 2018-06-26 NOTE — Telephone Encounter (Signed)
Patient in lobby - upset thought she had a regular apt today- said she had cancelled any Botox apts (one in there for 1-22) -her best call back [phone] to clear up the confustion

## 2018-06-26 NOTE — Telephone Encounter (Addendum)
I returned the call to the patient.  Stated she was actually suppose to be at her pain management doctor's office today at 1pm.

## 2018-06-28 ENCOUNTER — Other Ambulatory Visit: Payer: Self-pay | Admitting: Family Medicine

## 2018-06-28 DIAGNOSIS — Z1231 Encounter for screening mammogram for malignant neoplasm of breast: Secondary | ICD-10-CM

## 2018-07-01 DIAGNOSIS — L309 Dermatitis, unspecified: Secondary | ICD-10-CM | POA: Diagnosis not present

## 2018-07-01 DIAGNOSIS — E669 Obesity, unspecified: Secondary | ICD-10-CM | POA: Diagnosis not present

## 2018-07-01 DIAGNOSIS — R51 Headache: Secondary | ICD-10-CM | POA: Diagnosis not present

## 2018-07-03 ENCOUNTER — Ambulatory Visit
Admission: RE | Admit: 2018-07-03 | Discharge: 2018-07-03 | Disposition: A | Discharge status: Home / Self Care | Payer: Medicare Other | Source: Ambulatory Visit | Attending: Family Medicine | Admitting: Family Medicine

## 2018-07-03 DIAGNOSIS — Z1231 Encounter for screening mammogram for malignant neoplasm of breast: Secondary | ICD-10-CM

## 2018-07-04 ENCOUNTER — Ambulatory Visit: Payer: Medicare Other | Admitting: Neurology

## 2018-07-04 ENCOUNTER — Other Ambulatory Visit: Payer: Self-pay

## 2018-07-04 ENCOUNTER — Encounter: Payer: Self-pay | Admitting: Neurology

## 2018-07-04 VITALS — BP 138/83 | HR 64 | Resp 20 | Ht 64.0 in | Wt 255.5 lb

## 2018-07-04 DIAGNOSIS — G43709 Chronic migraine without aura, not intractable, without status migrainosus: Secondary | ICD-10-CM

## 2018-07-04 DIAGNOSIS — IMO0002 Reserved for concepts with insufficient information to code with codable children: Secondary | ICD-10-CM

## 2018-07-04 MED ORDER — GABAPENTIN 300 MG PO CAPS: 600.0000 mg | ORAL_CAPSULE | Freq: Two times a day (BID) | ORAL | 11 refills | Status: DC

## 2018-07-04 NOTE — Progress Notes (Signed)
PATIENT: [name] DOB: 12-Dec-1945  Chief Complaint  Patient presents with  . Migraines    New room. Sts. h/a's are less frequent, same severity since starting Gabapentin 473m bid. Sts. she started having "breakthru h/a's" in the afternoon, so she increased to 5036mbid.  Doesn't feel Botox was beneficial/fim     HISTORICAL [name] a [age]ears old female, seen in refer by her pain management doctor Patricia Hakimor evaluation of neck pain, frequent headaches, potential Botox injection, her primary care physician is [name].  She has past medical history of hyperlipidemia, cervical decompression surgery in the past.  Since 2015, she presented with frequent headaches, at that time she also has significant neck pain, eventually had anterior cervical decompression with fusion of C3-4, C5-6, and C6 and 7 by [name] August 2017, surgery did help her neck pain, but when she gradually tapering off the posterior surgical pain medications, she began to notice recurrence of her headaches.  Gradually getting worse over the past few years, now she has constant left-sided neck pain, radiating to left occipital left parietal temporal region, involving her left jaw, upper tooth constant deep achy pain, over the years, she has been taking 2 tablets of extra strength Tylenol on a regular basis, usually she consume 12 to14 tablets of double strength a day, she also tried multiple over-the-counter CBD oil, chiropractor adjustment, without helping her symptoms.  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  She was seen by pain management Dr. HaLuan Pullingdiagnosed  with cervicogenic pain, was given a trial of indomethacin twice a day for 6 days, with mild improvement, but has recurrent headaches once the medicine was stopped.  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, also involving pons, no acute abnormality,  She has tried Imitrex 25 mg as needed, which has been very helpful for headaches, but she used up 15 tablets within a short period of time, when she read about the potential side effect including stroke, she is very upset, decided not to take it anymore, continue with 15 tablets of Imitrex, after discussed with patient, we decided to discard it into disposal bin  Over the past few weeks, she has been taking CBD oil, which has been helpful,,  But she could not afford her long-term use, preferred to proceed with Botox injection as migraine prevention  Laboratory evaluation initially showed elevated C-reactive protein 65, correlate with her urinary tract infection, much improved to 7.9 at repeat laboratory evaluation without treatment, normal ESR, there is no evidence of temporal arteritis  Normal B12, TSH, negative ANA, A1c was 5.5,  UPDATE July 25 Wilson: She complains of frequent left side headaches, lasting for 2-3 hours, light noise sensitive, she is now taking CBD oil, tylenol 2 tabs bid  daily x 2 weeks,   She came in for her first Botox injection as migraine prevention, she is also taking Trokendi XR 200 mg 2 tablets every night,  UPDATE Oct 23 Wilson: She responded very well to her first Botox injection in July  Wilson, she has much less frequent and less severe headaches, headache is mainly on the left side  UPDATE Jul 04 2018: [name] has history of chronic migraines for many years. Her pain has always been on the left of her head. In the last 3 weeks, she started taking her dogs gabapentin medication 400 mg BID to help her headaches. She found that it alleviated her headaches. She called and let us know and we sent in a prescription for her with this dose. She recently started having breakthrough headaches in the afternoon, late in the day. She increased her gabapentin to 500 mg BID. This has really helped her headaches for the past 3 weeks. She has had 2 headaches in the past 3 weeks. The headaches occur to the left side of her head. When she gets a headache, she will lay down, and take a fiorcet. The headache will subside in 2-3 hours. The severity has greatly reduced. She denies n/v/photophobia. She had botox in October and didn't notice any benefit from it. She isn't interested in having any more botox. She reports having a droopy left eye lid following BOTOX Injection. She presents today for evaluation and is very pleased that the gabapentin has decreased her headaches.    REVIEW OF SYSTEMS: Full 14 system review of systems performed and notable only for headache. All other review of systems were negative.  ALLERGIES: Allergies  Allergen Reactions  . Nickel Hives and Rash    HOME MEDICATIONS: Current Outpatient Medications  Medication Sig Dispense Refill  . butalbital-acetaminophen-caffeine (FIORICET, ESGIC) 50-325-40 MG tablet Take 1 tablet by mouth every 6 (six) hours as needed for headache. Do not refill in less than 30 days 12 tablet 3  . Cholecalciferol (VITAMIN D3) 5000 units CAPS Take 5,000 Units by mouth daily.     . citalopram (CELEXA) 40 MG tablet Take 40 mg by mouth daily.      . fexofenadine (ALLEGRA) 180 MG tablet Take 180 mg by mouth at bedtime.     . fluticasone (FLONASE) 50  MCG/ACT nasal spray Place 2 sprays into the nose daily.      Marland Kitchen gabapentin (NEURONTIN) 100 MG capsule Take 4 capsules (400 mg total) by mouth 2 (two) times daily. 240 capsule 5  . meloxicam (MOBIC) 15 MG tablet Take 15 mg by mouth daily.    . methylphenidate (RITALIN) 10 MG tablet Take 10 mg by mouth daily as needed (focus).     . nortriptyline (PAMELOR) 10 MG capsule Take 2 capsules (20 mg total) by mouth at bedtime. 60 capsule 11  . OnabotulinumtoxinA (BOTOX IM) Inject 200 Units into the muscle every 3 (three) months.    . simvastatin (ZOCOR) 20 MG tablet Take 20 mg by mouth daily.     . tizanidine (ZANAFLEX) 2 MG capsule TAKE 1 CAPSULE BY MOUTH THREE TIMES DAILY 90 capsule 5  . traMADol (ULTRAM) 50 MG tablet Take 1 tablet (50 mg total) by mouth every 12 (twelve) hours as needed (migraine). 15 tablet 0  . triamterene-hydrochlorothiazide (MAXZIDE-25) 37.5-25 MG tablet Take 1 tablet by mouth daily.  0   No current facility-administered medications for this visit.     PAST MEDICAL HISTORY: Past Medical History:  Diagnosis  Date  . ADD (attention deficit disorder)   . Anxiety   . Arthritis    os  . GERD (gastroesophageal reflux disease)    barretts  . Headache   . High cholesterol   . Neck pain   . Obesity   . Personal history of radiation therapy 2005  . Sleep apnea    cpap   last sleep study> 3 yrs  . Swelling of both ankles     PAST SURGICAL HISTORY: Past Surgical History:  Procedure Laterality Date  . ABDOMINAL HYSTERECTOMY    . ANTERIOR CERVICAL DECOMP/DISCECTOMY FUSION N/A 02/15/2016   Procedure: Cervical three four-Cervical five-six, Cervical six-seven Anterior cervical decompression/diskectomy/fusion;  Surgeon: Kristeen Miss, MD;  Location: [facility];  Service: Neurosurgery;  Laterality: N/A;  . bicept surgery    . BREAST LUMPECTOMY    . CHOLECYSTECTOMY    . JOINT REPLACEMENT    . PAROTID GLAND TUMOR EXCISION    . TONSILLECTOMY    . TOTAL KNEE ARTHROPLASTY  07/19/2012    Procedure: TOTAL KNEE ARTHROPLASTY;  Surgeon: Yvette Rack., MD;  Location: [place];  Service: Orthopedics;  Laterality: Right;  right total knee arthroplasty  . TUBAL LIGATION      FAMILY HISTORY: Family History  Problem Relation Age of Onset  . Breast cancer Maternal Aunt   . Breast cancer Paternal Aunt   . Lung cancer Mother        age 43  . Colon cancer Father        age 80    SOCIAL HISTORY: Social History   Socioeconomic History  . Marital status: Married    Spouse name: Not on file  . Number of children: 2  . Years of education: some college  . Highest education level: Not on file  Occupational History  . Occupation: Retired  Scientific laboratory technician  . Financial resource strain: Not on file  . Food insecurity:    Worry: Not on file    Inability: Not on file  . Transportation needs:    Medical: Not on file    Non-medical: Not on file  Tobacco Use  . Smoking status: Former Smoker    Last attempt to quit: 07/11/1997    Years since quitting: 20.9  . Smokeless tobacco: Never Used  Substance and Sexual Activity  . Alcohol use: Not Currently  . Drug use: No  . Sexual activity: Not on file  Lifestyle  . Physical activity:    Days per week: Not on file    Minutes per session: Not on file  . Stress: Not on file  Relationships  . Social connections:    Talks on phone: Not on file    Gets together: Not on file    Attends religious service: Not on file    Active member of club or organization: Not on file    Attends meetings of clubs or organizations: Not on file    Relationship status: Not on file  . Intimate partner violence:    Fear of current or ex partner: Not on file    Emotionally abused: Not on file    Physically abused: Not on file    Forced sexual activity: Not on file  Other Topics Concern  . Not on file  Social History Narrative   Has her own house but her husband (separated) comes over daily.   32 ounces per day.   Right-handed.     PHYSICAL EXAM     Vitals:  07/04/18 1407  BP: 138/83  Pulse: 64  Resp: 20  Weight: 255 lb 8 oz (115.9 kg)  Height: _0  (1.626 m)    Not recorded      Body mass index is 43.86 kg/m.  PHYSICAL EXAMNIATION:  Gen: NAD, conversant, well nourised, obese, well groomed                     Cardiovascular: Regular rate rhythm, no peripheral edema, warm, nontender. Eyes: Conjunctivae clear without exudates or hemorrhage Neck: Supple, no carotid bruits. Pulmonary: Clear to auscultation bilaterally   NEUROLOGICAL EXAM:  MENTAL STATUS: Speech:    Speech is normal; fluent and spontaneous with normal comprehension.  Cognition:     Orientation to time, place and person     Normal recent and remote memory     Normal Attention span and concentration     Normal Language, naming, repeating,spontaneous speech     Fund of knowledge   CRANIAL NERVES: CN II: Visual fields are full to confrontation. Pupils are round equal and briskly reactive to light. CN III, IV, VI: extraocular movement are normal. No ptosis. CN V: Facial sensation is intact to pinprick in all 3 divisions bilaterally. Corneal responses are intact.  CN VII: Face is symmetric with normal eye closure and smile. CN IX, X: Palate elevates symmetrically. Phonation is normal. CN XI: Head turning and shoulder shrug are intact CN XII: Tongue is midline with normal movements and no atrophy.  MOTOR: There is no pronator drift of out-stretched arms. Muscle bulk and tone are normal. Muscle strength is normal.  REFLEXES: Reflexes are 2+ and symmetric at the biceps, triceps, knees, and ankles. Plantar responses are flexor.  SENSORY: Intact to light touch, pinprick, positional sensation and vibratory sensation are intact in fingers and toes.  COORDINATION: Rapid alternating movements and fine finger movements are intact. There is no dysmetria on finger-to-nose and heel-knee-shin.    GAIT/STANCE: Posture is normal. Gait is steady with normal  steps, base, arm swing, and turning. Heel and toe walking are normal. Tandem gait is normal.  Romberg is absent.  DIAGNOSTIC DATA (LABS, IMAGING, TESTING) - I reviewed patient records, labs, notes, testing and imaging myself where available.   ASSESSMENT AND PLAN  MICHEAL SHEEN is a 73 y.o. female    Left sided headache  Much improved with Gabapentin, 2 headaches in the last 3 weeks  Increase Gabapentin to 600 mg BID   Continue taking Fiorcet PRN for acute treatment, not to exceed 2-3 times per week  Continue taking nortriptyline 20 mg at bedtime for migraine prevention    Will not continue botox for migraine, due to lack of efficacy per patient report and a left droopy eye lid  Follow-up with our office on an as needed basis   Marcial Pacas, M.D. Ph.D.  [facility] [address] [place], [place] [address] Ph: [phone] Fax: [phone]  CC: Referring Provider

## 2018-07-10 ENCOUNTER — Ambulatory Visit: Payer: Self-pay | Admitting: Neurology

## 2018-07-25 DIAGNOSIS — R51 Headache: Secondary | ICD-10-CM | POA: Diagnosis not present

## 2018-07-30 DIAGNOSIS — G4730 Sleep apnea, unspecified: Secondary | ICD-10-CM | POA: Diagnosis not present

## 2018-09-29 ENCOUNTER — Other Ambulatory Visit: Payer: Self-pay | Admitting: Neurology

## 2018-10-22 DIAGNOSIS — G4733 Obstructive sleep apnea (adult) (pediatric): Secondary | ICD-10-CM | POA: Diagnosis not present

## 2018-11-15 DIAGNOSIS — K5900 Constipation, unspecified: Secondary | ICD-10-CM | POA: Diagnosis not present

## 2018-11-15 DIAGNOSIS — K219 Gastro-esophageal reflux disease without esophagitis: Secondary | ICD-10-CM | POA: Diagnosis not present

## 2018-11-15 DIAGNOSIS — K2270 Barrett's esophagus without dysplasia: Secondary | ICD-10-CM | POA: Diagnosis not present

## 2018-11-15 DIAGNOSIS — R1013 Epigastric pain: Secondary | ICD-10-CM | POA: Diagnosis not present

## 2018-11-18 DIAGNOSIS — R1013 Epigastric pain: Secondary | ICD-10-CM | POA: Diagnosis not present

## 2018-11-18 DIAGNOSIS — K5900 Constipation, unspecified: Secondary | ICD-10-CM | POA: Diagnosis not present

## 2018-11-18 DIAGNOSIS — D649 Anemia, unspecified: Secondary | ICD-10-CM | POA: Diagnosis not present

## 2018-11-20 DIAGNOSIS — D500 Iron deficiency anemia secondary to blood loss (chronic): Secondary | ICD-10-CM | POA: Diagnosis not present

## 2018-11-20 DIAGNOSIS — K5904 Chronic idiopathic constipation: Secondary | ICD-10-CM | POA: Diagnosis not present

## 2018-11-20 DIAGNOSIS — R1013 Epigastric pain: Secondary | ICD-10-CM | POA: Diagnosis not present

## 2018-11-20 DIAGNOSIS — K921 Melena: Secondary | ICD-10-CM | POA: Diagnosis not present

## 2018-11-27 DIAGNOSIS — K449 Diaphragmatic hernia without obstruction or gangrene: Secondary | ICD-10-CM | POA: Diagnosis not present

## 2018-11-27 DIAGNOSIS — R1013 Epigastric pain: Secondary | ICD-10-CM | POA: Diagnosis not present

## 2018-11-27 DIAGNOSIS — K259 Gastric ulcer, unspecified as acute or chronic, without hemorrhage or perforation: Secondary | ICD-10-CM | POA: Diagnosis not present

## 2018-11-27 DIAGNOSIS — K319 Disease of stomach and duodenum, unspecified: Secondary | ICD-10-CM | POA: Diagnosis not present

## 2018-11-27 DIAGNOSIS — Z86010 Personal history of colonic polyps: Secondary | ICD-10-CM | POA: Diagnosis not present

## 2018-11-27 DIAGNOSIS — K621 Rectal polyp: Secondary | ICD-10-CM | POA: Diagnosis not present

## 2018-11-27 DIAGNOSIS — D175 Benign lipomatous neoplasm of intra-abdominal organs: Secondary | ICD-10-CM | POA: Diagnosis not present

## 2018-11-27 DIAGNOSIS — K635 Polyp of colon: Secondary | ICD-10-CM | POA: Diagnosis not present

## 2018-11-27 DIAGNOSIS — K2270 Barrett's esophagus without dysplasia: Secondary | ICD-10-CM | POA: Diagnosis not present

## 2018-11-27 DIAGNOSIS — K3189 Other diseases of stomach and duodenum: Secondary | ICD-10-CM | POA: Diagnosis not present

## 2018-11-27 DIAGNOSIS — K921 Melena: Secondary | ICD-10-CM | POA: Diagnosis not present

## 2018-11-27 DIAGNOSIS — K5730 Diverticulosis of large intestine without perforation or abscess without bleeding: Secondary | ICD-10-CM | POA: Diagnosis not present

## 2018-12-03 DIAGNOSIS — K319 Disease of stomach and duodenum, unspecified: Secondary | ICD-10-CM | POA: Diagnosis not present

## 2018-12-03 DIAGNOSIS — K635 Polyp of colon: Secondary | ICD-10-CM | POA: Diagnosis not present

## 2018-12-03 DIAGNOSIS — K2270 Barrett's esophagus without dysplasia: Secondary | ICD-10-CM | POA: Diagnosis not present

## 2018-12-30 DIAGNOSIS — Z049 Encounter for examination and observation for unspecified reason: Secondary | ICD-10-CM | POA: Diagnosis not present

## 2019-01-16 DIAGNOSIS — D229 Melanocytic nevi, unspecified: Secondary | ICD-10-CM | POA: Diagnosis not present

## 2019-01-16 DIAGNOSIS — L218 Other seborrheic dermatitis: Secondary | ICD-10-CM | POA: Diagnosis not present

## 2019-01-16 DIAGNOSIS — L989 Disorder of the skin and subcutaneous tissue, unspecified: Secondary | ICD-10-CM | POA: Diagnosis not present

## 2019-01-16 DIAGNOSIS — L821 Other seborrheic keratosis: Secondary | ICD-10-CM | POA: Diagnosis not present

## 2019-01-16 DIAGNOSIS — L819 Disorder of pigmentation, unspecified: Secondary | ICD-10-CM | POA: Diagnosis not present

## 2019-01-20 DIAGNOSIS — G4733 Obstructive sleep apnea (adult) (pediatric): Secondary | ICD-10-CM | POA: Diagnosis not present

## 2019-01-30 ENCOUNTER — Emergency Department (HOSPITAL_COMMUNITY): Payer: Medicare Other

## 2019-01-30 ENCOUNTER — Observation Stay (HOSPITAL_COMMUNITY): Payer: Medicare Other

## 2019-01-30 ENCOUNTER — Other Ambulatory Visit: Payer: Self-pay

## 2019-01-30 ENCOUNTER — Inpatient Hospital Stay (HOSPITAL_COMMUNITY)
Admission: EM | Admit: 2019-01-30 | Discharge: 2019-02-01 | DRG: 074 | Disposition: A | Discharge status: Home / Self Care | Payer: Medicare Other | Attending: Internal Medicine | Admitting: Internal Medicine

## 2019-01-30 ENCOUNTER — Encounter (HOSPITAL_COMMUNITY): Payer: Self-pay | Admitting: Emergency Medicine

## 2019-01-30 DIAGNOSIS — M5412 Radiculopathy, cervical region: Secondary | ICD-10-CM | POA: Diagnosis not present

## 2019-01-30 DIAGNOSIS — M4804 Spinal stenosis, thoracic region: Secondary | ICD-10-CM | POA: Diagnosis present

## 2019-01-30 DIAGNOSIS — Z90710 Acquired absence of both cervix and uterus: Secondary | ICD-10-CM | POA: Diagnosis not present

## 2019-01-30 DIAGNOSIS — M4802 Spinal stenosis, cervical region: Secondary | ICD-10-CM | POA: Diagnosis not present

## 2019-01-30 DIAGNOSIS — Z96651 Presence of right artificial knee joint: Secondary | ICD-10-CM | POA: Diagnosis present

## 2019-01-30 DIAGNOSIS — R0902 Hypoxemia: Secondary | ICD-10-CM | POA: Diagnosis not present

## 2019-01-30 DIAGNOSIS — R531 Weakness: Secondary | ICD-10-CM | POA: Diagnosis present

## 2019-01-30 DIAGNOSIS — D631 Anemia in chronic kidney disease: Secondary | ICD-10-CM | POA: Diagnosis present

## 2019-01-30 DIAGNOSIS — F419 Anxiety disorder, unspecified: Secondary | ICD-10-CM | POA: Diagnosis present

## 2019-01-30 DIAGNOSIS — I129 Hypertensive chronic kidney disease with stage 1 through stage 4 chronic kidney disease, or unspecified chronic kidney disease: Secondary | ICD-10-CM | POA: Diagnosis present

## 2019-01-30 DIAGNOSIS — Z6841 Body Mass Index (BMI) 40.0 and over, adult: Secondary | ICD-10-CM | POA: Diagnosis not present

## 2019-01-30 DIAGNOSIS — Z87891 Personal history of nicotine dependence: Secondary | ICD-10-CM

## 2019-01-30 DIAGNOSIS — Z800 Family history of malignant neoplasm of digestive organs: Secondary | ICD-10-CM | POA: Diagnosis not present

## 2019-01-30 DIAGNOSIS — K2270 Barrett's esophagus without dysplasia: Secondary | ICD-10-CM | POA: Diagnosis present

## 2019-01-30 DIAGNOSIS — K219 Gastro-esophageal reflux disease without esophagitis: Secondary | ICD-10-CM | POA: Diagnosis not present

## 2019-01-30 DIAGNOSIS — Z923 Personal history of irradiation: Secondary | ICD-10-CM

## 2019-01-30 DIAGNOSIS — Z801 Family history of malignant neoplasm of trachea, bronchus and lung: Secondary | ICD-10-CM | POA: Diagnosis not present

## 2019-01-30 DIAGNOSIS — R29818 Other symptoms and signs involving the nervous system: Secondary | ICD-10-CM | POA: Diagnosis not present

## 2019-01-30 DIAGNOSIS — E876 Hypokalemia: Secondary | ICD-10-CM | POA: Diagnosis not present

## 2019-01-30 DIAGNOSIS — G4733 Obstructive sleep apnea (adult) (pediatric): Secondary | ICD-10-CM | POA: Diagnosis present

## 2019-01-30 DIAGNOSIS — Z981 Arthrodesis status: Secondary | ICD-10-CM

## 2019-01-30 DIAGNOSIS — E785 Hyperlipidemia, unspecified: Secondary | ICD-10-CM | POA: Diagnosis not present

## 2019-01-30 DIAGNOSIS — Z79899 Other long term (current) drug therapy: Secondary | ICD-10-CM

## 2019-01-30 DIAGNOSIS — N183 Chronic kidney disease, stage 3 (moderate): Secondary | ICD-10-CM | POA: Diagnosis not present

## 2019-01-30 DIAGNOSIS — R7303 Prediabetes: Secondary | ICD-10-CM | POA: Diagnosis present

## 2019-01-30 DIAGNOSIS — Z888 Allergy status to other drugs, medicaments and biological substances status: Secondary | ICD-10-CM

## 2019-01-30 DIAGNOSIS — R079 Chest pain, unspecified: Secondary | ICD-10-CM | POA: Diagnosis not present

## 2019-01-30 DIAGNOSIS — Z79891 Long term (current) use of opiate analgesic: Secondary | ICD-10-CM

## 2019-01-30 DIAGNOSIS — M25519 Pain in unspecified shoulder: Secondary | ICD-10-CM | POA: Diagnosis not present

## 2019-01-30 DIAGNOSIS — R0989 Other specified symptoms and signs involving the circulatory and respiratory systems: Secondary | ICD-10-CM | POA: Diagnosis not present

## 2019-01-30 DIAGNOSIS — R200 Anesthesia of skin: Secondary | ICD-10-CM

## 2019-01-30 DIAGNOSIS — Z803 Family history of malignant neoplasm of breast: Secondary | ICD-10-CM | POA: Diagnosis not present

## 2019-01-30 DIAGNOSIS — F329 Major depressive disorder, single episode, unspecified: Secondary | ICD-10-CM | POA: Diagnosis present

## 2019-01-30 DIAGNOSIS — G43909 Migraine, unspecified, not intractable, without status migrainosus: Secondary | ICD-10-CM | POA: Diagnosis not present

## 2019-01-30 DIAGNOSIS — Z20828 Contact with and (suspected) exposure to other viral communicable diseases: Secondary | ICD-10-CM | POA: Diagnosis not present

## 2019-01-30 DIAGNOSIS — I10 Essential (primary) hypertension: Secondary | ICD-10-CM

## 2019-01-30 LAB — URINALYSIS, ROUTINE W REFLEX MICROSCOPIC
Bilirubin Urine: NEGATIVE
Glucose, UA: NEGATIVE mg/dL
Hgb urine dipstick: NEGATIVE
Ketones, ur: NEGATIVE mg/dL
Leukocytes,Ua: NEGATIVE
Nitrite: NEGATIVE
Protein, ur: NEGATIVE mg/dL
Specific Gravity, Urine: 1.014 (ref 1.005–1.030)
pH: 5 (ref 5.0–8.0)

## 2019-01-30 LAB — CBC
HCT: 36.5 % (ref 36.0–46.0)
HCT: 34.6 % — ABNORMAL LOW (ref 36.0–46.0)
Hemoglobin: 11.4 g/dL — ABNORMAL LOW (ref 12.0–15.0)
Hemoglobin: 10.9 g/dL — ABNORMAL LOW (ref 12.0–15.0)
MCH: 28.8 pg (ref 26.0–34.0)
MCH: 28.4 pg (ref 26.0–34.0)
MCHC: 31.2 g/dL (ref 30.0–36.0)
MCHC: 31.5 g/dL (ref 30.0–36.0)
MCV: 92.2 fL (ref 80.0–100.0)
MCV: 90.1 fL (ref 80.0–100.0)
Platelets: 258 10*3/uL (ref 150–400)
Platelets: 255 10*3/uL (ref 150–400)
RBC: 3.96 MIL/uL (ref 3.87–5.11)
RBC: 3.84 MIL/uL — ABNORMAL LOW (ref 3.87–5.11)
RDW: 14.5 % (ref 11.5–15.5)
RDW: 14.4 % (ref 11.5–15.5)
WBC: 7.7 10*3/uL (ref 4.0–10.5)
WBC: 6.1 10*3/uL (ref 4.0–10.5)
nRBC: 0 % (ref 0.0–0.2)
nRBC: 0 % (ref 0.0–0.2)

## 2019-01-30 LAB — I-STAT CHEM 8, ED
BUN: 16 mg/dL (ref 8–23)
Calcium, Ion: 1.17 mmol/L (ref 1.15–1.40)
Chloride: 103 mmol/L (ref 98–111)
Creatinine, Ser: 1.3 mg/dL — ABNORMAL HIGH (ref 0.44–1.00)
Glucose, Bld: 137 mg/dL — ABNORMAL HIGH (ref 70–99)
HCT: 34 % — ABNORMAL LOW (ref 36.0–46.0)
Hemoglobin: 11.6 g/dL — ABNORMAL LOW (ref 12.0–15.0)
Potassium: 3.1 mmol/L — ABNORMAL LOW (ref 3.5–5.1)
Sodium: 139 mmol/L (ref 135–145)
TCO2: 28 mmol/L (ref 22–32)

## 2019-01-30 LAB — RAPID URINE DRUG SCREEN, HOSP PERFORMED
Amphetamines: NOT DETECTED
Barbiturates: NOT DETECTED
Benzodiazepines: NOT DETECTED
Cocaine: NOT DETECTED
Opiates: NOT DETECTED
Tetrahydrocannabinol: NOT DETECTED

## 2019-01-30 LAB — CREATININE, SERUM
Creatinine, Ser: 1.39 mg/dL — ABNORMAL HIGH (ref 0.44–1.00)
GFR calc Af Amer: 44 mL/min — ABNORMAL LOW (ref >60)
GFR calc non Af Amer: 38 mL/min — ABNORMAL LOW (ref >60)

## 2019-01-30 LAB — BASIC METABOLIC PANEL
Anion gap: 11 (ref 5–15)
BUN: 18 mg/dL (ref 8–23)
CO2: 25 mmol/L (ref 22–32)
Calcium: 9.6 mg/dL (ref 8.9–10.3)
Chloride: 103 mmol/L (ref 98–111)
Creatinine, Ser: 1.35 mg/dL — ABNORMAL HIGH (ref 0.44–1.00)
GFR calc Af Amer: 46 mL/min — ABNORMAL LOW (ref >60)
GFR calc non Af Amer: 39 mL/min — ABNORMAL LOW (ref >60)
Glucose, Bld: 137 mg/dL — ABNORMAL HIGH (ref 70–99)
Potassium: 3 mmol/L — ABNORMAL LOW (ref 3.5–5.1)
Sodium: 139 mmol/L (ref 135–145)

## 2019-01-30 LAB — DIFFERENTIAL
Basophils Absolute: 0.1 10*3/uL (ref 0.0–0.1)
Basophils Relative: 1 %
Eosinophils Absolute: 0.2 10*3/uL (ref 0.0–0.5)
Eosinophils Relative: 3 %
Lymphocytes Relative: 19 %
Lymphs Abs: 1.4 10*3/uL (ref 0.7–4.0)
Monocytes Absolute: 0.6 10*3/uL (ref 0.1–1.0)
Monocytes Relative: 8 %
Neutro Abs: 5.3 10*3/uL (ref 1.7–7.7)
Neutrophils Relative %: 69 %

## 2019-01-30 LAB — APTT: aPTT: 32 seconds (ref 24–36)

## 2019-01-30 LAB — PROTIME-INR
INR: 0.9 (ref 0.8–1.2)
Prothrombin Time: 12.1 seconds (ref 11.4–15.2)

## 2019-01-30 LAB — SARS CORONAVIRUS 2 (TAT 6-24 HRS): SARS Coronavirus 2: NEGATIVE

## 2019-01-30 LAB — ETHANOL: Alcohol, Ethyl (B): <10 mg/dL (ref <10)

## 2019-01-30 LAB — MAGNESIUM: Magnesium: 2.1 mg/dL (ref 1.7–2.4)

## 2019-01-30 LAB — TROPONIN I (HIGH SENSITIVITY)
Troponin I (High Sensitivity): 5 ng/L (ref <18)
Troponin I (High Sensitivity): 7 ng/L (ref <18)

## 2019-01-30 MED ORDER — PANTOPRAZOLE SODIUM 40 MG PO TBEC: 40.0000 mg | DELAYED_RELEASE_TABLET | Freq: Every day | ORAL | Status: DC

## 2019-01-30 MED ORDER — DIPHENHYDRAMINE HCL 25 MG PO CAPS
25.0000 mg | ORAL_CAPSULE | Freq: Every day | ORAL | Status: DC
  Administered 2019-01-30 – 2019-01-31 (×2): 25 mg via ORAL
  Filled 2019-01-30 (×2): qty 1

## 2019-01-30 MED ORDER — CITALOPRAM HYDROBROMIDE 40 MG PO TABS
40.0000 mg | ORAL_TABLET | Freq: Every day | ORAL | Status: DC
  Administered 2019-01-30 – 2019-02-01 (×3): 40 mg via ORAL
  Filled 2019-01-30 (×3): qty 1

## 2019-01-30 MED ORDER — ACETAMINOPHEN 650 MG RE SUPP: 650.0000 mg | Freq: Four times a day (QID) | RECTAL | Status: DC | PRN

## 2019-01-30 MED ORDER — ENOXAPARIN SODIUM 40 MG/0.4ML ~~LOC~~ SOLN
40.0000 mg | SUBCUTANEOUS | Status: DC
  Administered 2019-01-30 – 2019-01-31 (×2): 40 mg via SUBCUTANEOUS
  Filled 2019-01-30 (×2): qty 0.4

## 2019-01-30 MED ORDER — PANTOPRAZOLE SODIUM 40 MG PO TBEC
40.0000 mg | DELAYED_RELEASE_TABLET | Freq: Every day | ORAL | Status: DC
  Administered 2019-01-31 – 2019-02-01 (×2): 40 mg via ORAL
  Filled 2019-01-30 (×2): qty 1

## 2019-01-30 MED ORDER — TIZANIDINE HCL 4 MG PO TABS
2.0000 mg | ORAL_TABLET | Freq: Three times a day (TID) | ORAL | Status: DC
  Administered 2019-01-30 – 2019-02-01 (×6): 2 mg via ORAL
  Filled 2019-01-30 (×6): qty 1

## 2019-01-30 MED ORDER — ASPIRIN 81 MG PO CHEW
81.0000 mg | CHEWABLE_TABLET | Freq: Every day | ORAL | Status: DC
  Administered 2019-01-31 – 2019-02-01 (×2): 81 mg via ORAL
  Filled 2019-01-30 (×2): qty 1

## 2019-01-30 MED ORDER — POTASSIUM CHLORIDE CRYS ER 20 MEQ PO TBCR
40.0000 meq | EXTENDED_RELEASE_TABLET | ORAL | Status: AC
  Administered 2019-01-30: 40 meq via ORAL
  Filled 2019-01-30 (×2): qty 2

## 2019-01-30 MED ORDER — NORTRIPTYLINE HCL 10 MG PO CAPS
20.0000 mg | ORAL_CAPSULE | Freq: Every day | ORAL | Status: DC
  Administered 2019-01-30 – 2019-01-31 (×2): 20 mg via ORAL
  Filled 2019-01-30 (×3): qty 2

## 2019-01-30 MED ORDER — STROKE: EARLY STAGES OF RECOVERY BOOK
Freq: Once | Status: DC
  Filled 2019-01-30 (×2): qty 1

## 2019-01-30 MED ORDER — SODIUM CHLORIDE 0.9% FLUSH
3.0000 mL | Freq: Once | INTRAVENOUS | Status: AC
  Administered 2019-01-30: 3 mL via INTRAVENOUS

## 2019-01-30 MED ORDER — INDOMETHACIN 25 MG PO CAPS
50.0000 mg | ORAL_CAPSULE | Freq: Two times a day (BID) | ORAL | Status: DC
  Administered 2019-01-30: 19:00:00 50 mg via ORAL
  Filled 2019-01-30 (×3): qty 2

## 2019-01-30 MED ORDER — ACETAMINOPHEN 325 MG PO TABS: 650.0000 mg | ORAL_TABLET | Freq: Four times a day (QID) | ORAL | Status: DC | PRN

## 2019-01-30 MED ORDER — FERROUS SULFATE 325 (65 FE) MG PO TABS
325.0000 mg | ORAL_TABLET | Freq: Every day | ORAL | Status: DC
  Administered 2019-01-31 – 2019-02-01 (×2): 325 mg via ORAL
  Filled 2019-01-30 (×2): qty 1

## 2019-01-30 MED ORDER — SIMVASTATIN 20 MG PO TABS
20.0000 mg | ORAL_TABLET | Freq: Every day | ORAL | Status: DC
  Administered 2019-01-30 – 2019-02-01 (×3): 20 mg via ORAL
  Filled 2019-01-30 (×3): qty 1

## 2019-01-30 MED ORDER — FUROSEMIDE 80 MG PO TABS
80.0000 mg | ORAL_TABLET | Freq: Every day | ORAL | Status: DC
  Administered 2019-01-31: 80 mg via ORAL
  Filled 2019-01-30 (×2): qty 1

## 2019-01-30 MED ORDER — LORATADINE 10 MG PO TABS
10.0000 mg | ORAL_TABLET | Freq: Every day | ORAL | Status: DC
  Administered 2019-01-30 – 2019-02-01 (×3): 10 mg via ORAL
  Filled 2019-01-30 (×3): qty 1

## 2019-01-30 NOTE — Progress Notes (Signed)
Telephone consultation performed by tele-specialists.  That consultation had been requested, however video software was not accessible.  For this reason telephone consultation performed.  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  At approximately 2:30 in the morning she developed right arm numbness and worsening of right facial numbness.  She also has right arm pain.  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  The numbness in her arm per report affects all 5 fingers and the entirety of her arm.  There does not appear to be any ability to change the numbness or pain by movement of the arm or neck.  She has no other new weakness numbness or tingling.  Her CT of the head does not show any acute changes.  She is hypertensive in the emergency department. She took multiple aspirins prior to coming into the hospital Her NIH stroke scale score per report is a 1 for the new numbness in the right upper extremity.  Discussed that she is not an ideal tPA candidate due to this but to recommend admission to the hospital and workup.  Unclear if this represents a cervical myelopathy which sounds more probable due to the arm pain and localization primarily to the arm and lack of weakness.  Less likely but possible would be a subcortical stroke syndrome.  She already took sufficient aspirin recommend admission to the hospital with consideration for MRI of the cervical spine and brain.  Deferred inpatient neurology.  Would allow permissive hypertension up to neuro imaging be performed.  She'll need physical and occupational therapy.  Please call with additional questions Discussed with the emergency department staff  Katina Degree  [employer]

## 2019-01-30 NOTE — Progress Notes (Signed)
PT Note  Patient Details Name: [name] MRN: [id number] DOB: 1945/07/21  Noted PT eval ordered and pt being transported to [facility]. Will shift PT eval to occur on the [facility].       Clide Dales 01/30/2019, 1:58 PM  Clide Dales, PT Acute Rehabilitation Services Pager: [phone] Office: [phone] 01/30/2019

## 2019-01-30 NOTE — ED Notes (Signed)
[facility] called. 

## 2019-01-30 NOTE — ED Notes (Signed)
0030: Pain  0200: Numbness/weakness right arm and hand

## 2019-01-30 NOTE — ED Provider Notes (Signed)
TIME SEEN: 4:44 AM  CHIEF COMPLAINT: Right arm numbness  HPI: Patient is a 73 year old female with history of hyperlipidemia, morbid obesity who presents to the emergency department with complaints of right arm pain that radiates into the right upper back and right shoulder and right arm numbness.  States that she went to bed around 12:30 AM with discomfort in the right antecubital fossa.  She states she thought that this could have been from previous biceps tendon surgery that she had years ago.  She is right-hand dominant but did not recall any increased physical exertion or injury to this arm.  States symptoms progressively worsened and she was never able to follow asleep and then at 2:30 AM she felt like her entire right arm went numb.  She states that she also has some right facial numbness but she thinks this may be from having a previous parotid tumor removed years ago.  No weakness.  She states she is not having any chest discomfort, tightness or pressure, shortness of breath, nausea, vomiting, diaphoresis or dizziness.  She describes the discomfort in her arm as an aching pain.  She chronically has neck pain which is unchanged.  No other new back pain.  No bowel or bladder incontinence.  Able to ambulate.  No previous history of MI, CVA, TIA.  She denies any aggravating or alleviating factors.  Pain does not increase with palpation or movement of the arm.  She reports she took 7 baby aspirin at home and an oxycodone tablet prior to arrival.  ROS: See HPI Constitutional: no fever  Eyes: no drainage  ENT: no runny nose   Cardiovascular:  no chest pain  Resp: no SOB  GI: no vomiting GU: no dysuria Integumentary: no rash  Allergy: no hives  Musculoskeletal: no leg swelling  Neurological: no slurred speech ROS otherwise negative  PAST MEDICAL HISTORY/PAST SURGICAL HISTORY:  Past Medical History:  Diagnosis Date  . ADD (attention deficit disorder)   . Anxiety   . Arthritis    os  . GERD  (gastroesophageal reflux disease)    barretts  . Headache   . High cholesterol   . Neck pain   . Obesity   . Personal history of radiation therapy 2005  . Sleep apnea    cpap   last sleep study> 3 yrs  . Swelling of both ankles     MEDICATIONS:  Prior to Admission medications   Medication Sig Start Date End Date Taking? Authorizing Provider  butalbital-acetaminophen-caffeine (FIORICET, ESGIC) 50-325-40 MG tablet Take 1 tablet by mouth every 6 (six) hours as needed for headache. Do not refill in less than 30 days 04/10/18   Marcial Pacas, MD  Cholecalciferol (VITAMIN D3) 5000 units CAPS Take 5,000 Units by mouth daily.     [provider]  citalopram (CELEXA) 40 MG tablet Take 40 mg by mouth daily.      [provider]  fexofenadine (ALLEGRA) 180 MG tablet Take 180 mg by mouth at bedtime.     [provider]  fluticasone (FLONASE) 50 MCG/ACT nasal spray Place 2 sprays into the nose daily.      [provider]  gabapentin (NEURONTIN) 300 MG capsule Take 2 capsules (600 mg total) by mouth 2 (two) times daily. 07/04/18   Marcial Pacas, MD  meloxicam (MOBIC) 15 MG tablet Take 15 mg by mouth daily.    [provider]  methylphenidate (RITALIN) 10 MG tablet Take 10 mg by mouth daily as needed (focus).  [provider]  nortriptyline (PAMELOR) 10 MG capsule TAKE 2 CAPSULES BY MOUTH AT BEDTIME 09/30/18   Marcial Pacas, MD  OnabotulinumtoxinA (BOTOX IM) Inject 200 Units into the muscle every 3 (three) months.    [provider]  simvastatin (ZOCOR) 20 MG tablet Take 20 mg by mouth daily.     [provider]  tizanidine (ZANAFLEX) 2 MG capsule TAKE 1 CAPSULE BY MOUTH THREE TIMES DAILY 06/10/18   Marcial Pacas, MD  traMADol (ULTRAM) 50 MG tablet Take 1 tablet (50 mg total) by mouth every 12 (twelve) hours as needed (migraine). 04/25/18   Marcial Pacas, MD  triamterene-hydrochlorothiazide (MAXZIDE-25) 37.5-25 MG tablet Take 1 tablet by mouth  daily. 12/06/15   [provider]    ALLERGIES:  Allergies  Allergen Reactions  . Nickel Hives and Rash    SOCIAL HISTORY:  Social History   Tobacco Use  . Smoking status: Former Smoker    Quit date: 07/11/1997    Years since quitting: 21.5  . Smokeless tobacco: Never Used  Substance Use Topics  . Alcohol use: Not Currently    FAMILY HISTORY: Family History  Problem Relation Age of Onset  . Breast cancer Maternal Aunt   . Breast cancer Paternal Aunt   . Lung cancer Mother        age 36  . Colon cancer Father        age 13    EXAM: BP (!) 186/89 (BP Location: Right Arm)   Pulse 90   Temp 98.3 F (36.8 C) (Oral)   Resp 16   Ht 5\' 4"  (1.626 m)   Wt 115.7 kg   SpO2 95%   BMI 43.77 kg/m  CONSTITUTIONAL: Alert and oriented and responds appropriately to questions.  Elderly, morbidly obese, chronically ill-appearing HEAD: Normocephalic, atraumatic EYES: Conjunctivae clear, pupils appear equal, EOMI ENT: normal nose; moist mucous membranes NECK: Supple, no meningismus, no nuchal rigidity, no LAD, no midline spinal tenderness or step-off or deformity CARD: RRR; S1 and S2 appreciated; no murmurs, no clicks, no rubs, no gallops RESP: Normal chest excursion without splinting or tachypnea; breath sounds clear and equal bilaterally; no wheezes, no rhonchi, no rales, no hypoxia or respiratory distress, speaking full sentences ABD/GI: Normal bowel sounds; non-distended; soft, non-tender, no rebound, no guarding, no peritoneal signs, no hepatosplenomegaly BACK:  The back appears normal and is non-tender to palpation, there is no CVA tenderness, no midline spinal tenderness or step-off or deformity, no tenderness over the right trapezius muscle EXT: Normal ROM in all joints; non-tender to palpation; no edema; normal capillary refill; no cyanosis, no calf tenderness or swelling    SKIN: Normal color for age and race; warm; no rash NEURO: Moves all extremities equally, no  drift, patient has diminished sensation in her right face and right arm compared to the left side.  Normal sensation in both legs.  Cranial nerves II through XII intact.  Normal speech.  Normal gait. PSYCH: The patient's mood and manner are appropriate. Grooming and personal hygiene are appropriate.  MEDICAL DECISION MAKING: Patient here with right arm discomfort and right arm and face numbness.  She states she thinks the face numbness is old from a previous parotid tumor removal but the right arm numbness is new and started at 2:30 AM.  Her NIH stroke scale at this time would be a 1.  I have called a code stroke given she is within TPA window although I do not feel she would be a great  candidate given symptoms seem to be atypical and her stroke scale is only 1.  I do not think that this is consistent with radiculopathy given she reports it is the entire right arm that is numb and not part of it.  There is no weakness on exam.  This also seems very atypical for ACS.  She is hypertensive here.  Will obtain labs, urine, head CT, EKG.  Will consult telemetry neurology.  ED PROGRESS:   Dr. Sara Chu with tele neuro recommends MRI brain and MRI C spine without contrast.  Recommends medical admission for stroke work-up.  Agrees patient is not a TPA candidate given NIH stroke scale of 1.  Is allowing for permissive hypertension at this time.  Patient's head CT is negative.   6:02 AM Discussed patient's case with hospitalist, Dr. Hal Hope.  I have recommended admission and patient (and family if present) agree with this plan. Admitting physician will place admission orders.   I reviewed all nursing notes, vitals, pertinent previous records, EKGs, lab and urine results, imaging (as available).   6:40 AM  Troponin negative.  Urine shows no sign of infection or dehydration.  UDS negative.  Still complaining of numbness.   EKG Interpretation  Date/Time:  Thursday January 30 2019 04:16:57  EDT Ventricular Rate:  89 PR Interval:    QRS Duration: 103 QT Interval:  386 QTC Calculation: 470 R Axis:   35 Text Interpretation:  Sinus rhythm Minimal ST depression, lateral leads Baseline wander in lead(s) III aVL No significant change since last tracing Confirmed by Sukhraj Esquivias, Cyril Mourning [id number]) on 01/30/2019 4:27:50 AM         Enas Winchel, Delice Bison, DO 01/30/19 [phone]

## 2019-01-30 NOTE — ED Notes (Signed)
Report given to Olivia Mackie, RN [facility] 3W room 5

## 2019-01-30 NOTE — Progress Notes (Signed)
SLP Cancellation Note  Patient Details Name: [name] MRN: [id number] DOB: [birth year]   Cancelled treatment:       Reason Eval/Treat Not Completed: SLP screened, no needs identified, will sign off   Dannial Monarch 01/30/2019, 4:27 PM   Sonia Baller, MA, [place] [facility] Acute Rehab

## 2019-01-30 NOTE — Evaluation (Signed)
Occupational Therapy Evaluation Patient Details Name: [name] MRN: [id number] DOB: July 15, 1945 Today's Date: 01/30/2019    History of Present Illness Pt is a 73 yo female s/p R arm numbness and pain with decreased coordination. 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 MRI Brain: negative for acute abnormalities with chronic small vessel diseases. PMHx: knee replacement.   Clinical Impression   Pt PTA: living with spouse and independent. Pt's only focal deficit is RUE weakness 3-/5 MM grade and poor fine motor coordination. RUE is dominant hand. Pt modified independence with increased time for ADL at sink and at commode. Pt ambulating with good balance and no LOB episodes. Pt given fine motor coordination handout and yellow theraputty with handout. Pt kept stating "I don't like this at all. I can hardly use my R hand. Pt would benefit from continued OT skilled services for ADL and RUE HEP progression. OT following acutely.      Follow Up Recommendations  Home health OT;Outpatient OT(If pt feels that she needs further OT. HEP for RUE provided.)    Equipment Recommendations  None recommended by OT    Recommendations for Other Services       Precautions / Restrictions Precautions Precautions: Fall Restrictions Weight Bearing Restrictions: No      Mobility Bed Mobility Overal bed mobility: Modified Independent                Transfers Overall transfer level: Modified independent               General transfer comment: No dizziness reported throughout session    Balance Overall balance assessment: Needs assistance Sitting-balance support: Feet supported Sitting balance-Leahy Scale: Good     Standing balance support: No upper extremity supported Standing balance-Leahy Scale: Fair                             ADL either performed or assessed with clinical judgement   ADL Overall ADL's : At baseline                                        General ADL Comments: Pt able to perform grooming routine with increased time due to coordination deficits in RUE.     Vision Baseline Vision/History: Wears glasses Wears Glasses: At all times Patient Visual Report: No change from baseline Vision Assessment?: No apparent visual deficits     Perception     Praxis      Pertinent Vitals/Pain Pain Assessment: 0-10 Pain Score: 3  Pain Location: R upper back, tricep region on R side Pain Descriptors / Indicators: Discomfort Pain Intervention(s): Limited activity within patient's tolerance     Hand Dominance Right   Extremity/Trunk Assessment Upper Extremity Assessment Upper Extremity Assessment: Generalized weakness;RUE deficits/detail RUE Deficits / Details: poor to fair coordination and decreased strength 3-/5 MM grade RUE Coordination: decreased fine motor   Lower Extremity Assessment Lower Extremity Assessment: Overall WFL for tasks assessed   Cervical / Trunk Assessment Cervical / Trunk Assessment: Normal   Communication Communication Communication: No difficulties   Cognition Arousal/Alertness: Awake/alert Behavior During Therapy: WFL for tasks assessed/performed Overall Cognitive Status: Within Functional Limits for tasks assessed  General Comments       Exercises Exercises: Other exercises Other Exercises Other Exercises: fine motor coordination exercises with handout Other Exercises: theraputty exercises with handout provided   Shoulder Instructions      Home Living Family/patient expects to be discharged to:: Private residence Living Arrangements: Spouse/significant other Available Help at Discharge: Family;Available 24 hours/day Type of Home: House Home Access: Stairs to enter [employer] of Steps: 1   Home Layout: One level     Bathroom Shower/Tub: Teacher, early years/pre: Standard     Home  Equipment: Cane - single point;Walker - 4 wheels          Prior Functioning/Environment Level of Independence: Independent with assistive device(s)        Comments: Independent; uses rollator for mobility when back pain is really bad        OT Problem List: Decreased strength;Decreased coordination;Impaired UE functional use      OT Treatment/Interventions: Self-care/ADL training;Therapeutic exercise;Neuromuscular education;Energy conservation    OT Goals(Current goals can be found in the care plan section) Acute Rehab OT Goals Patient Stated Goal: to get my right hand back OT Goal Formulation: With patient Time For Goal Achievement: 02/13/19 Potential to Achieve Goals: Good ADL Goals Pt/caregiver will Perform Home Exercise Program: Increased strength;Right Upper extremity;With written HEP provided Additional ADL Goal #1: Pt will increase to good fine motor coordination with ADL tasks using RUE.  OT Frequency: Min 2X/week   Barriers to D/C:            Co-evaluation              AM-PAC OT "6 Clicks" Daily Activity     Outcome Measure Help from another person eating meals?: None Help from another person taking care of personal grooming?: None Help from another person toileting, which includes using toliet, bedpan, or urinal?: A Little Help from another person bathing (including washing, rinsing, drying)?: A Little Help from another person to put on and taking off regular upper body clothing?: None Help from another person to put on and taking off regular lower body clothing?: A Little 6 Click Score: 21   End of Session Equipment Utilized During Treatment: Rolling walker Nurse Communication: Mobility status  Activity Tolerance: Patient tolerated treatment well Patient left: in bed;with call bell/phone within reach;with bed alarm set  OT Visit Diagnosis: Unsteadiness on feet (R26.81);Muscle weakness (generalized) (M62.81)                Time: 6761-9509 OT Time  Calculation (min): 36 min Charges:  OT General Charges $OT Visit: 1 Visit OT Evaluation $OT Eval Moderate Complexity: 1 Mod OT Treatments $Neuromuscular Re-education: 8-22 mins  Ebony Hail Harold Hedge) Marsa Aris OTR/L Acute Rehabilitation Services Pager: [phone] Office: [place] 01/30/2019, 4:08 PM

## 2019-01-30 NOTE — ED Notes (Signed)
Stroke Swallow Screen Performed, Pt passed.

## 2019-01-30 NOTE — ED Triage Notes (Signed)
Patient is complaining of right arm pain that went across chest to left side. Patient states it radiates to her back. Patient took 7- baby aspirin and one oxycodone. Patient states this started at 10:00pm.

## 2019-01-30 NOTE — ED Notes (Signed)
Patient transported to MRI 

## 2019-01-30 NOTE — ED Notes (Signed)
0030- last seen normal per pt  0448- code stroke called 0449- IV completed,blood work drawn, Nutritional therapist at bedside Earnest Bailey, Katie) [phone]- Pt transported to [facility]

## 2019-01-30 NOTE — Progress Notes (Signed)
Patient arrived via [facility]. No complaints. Vss. Denies pain.

## 2019-01-30 NOTE — ED Notes (Addendum)
Patient transported to CT 

## 2019-01-30 NOTE — H&P (Addendum)
History and Physical    [name] WJX:[id number] DOB: [birth year] DOA: 01/30/2019  PCP: Leonard Downing, MD  Patient coming from: home  I have personally briefly reviewed patient's old medical records in [place]  Chief Complaint: Right arm numbness  HPI: [name] is [name] 73 y.o. female with medical history significant of anxiety, GERD, migraines, OSA, obesity, C spine surgery presenting with sudden pain and numbness in her RUE.  She notes she went to bed around midnihgt last night and developed pain in her R arm.  This progressed down into her hand, spearding from her shoulder.  She noticed her arm and hand were numb around 2 oclock.  She also noticed pain in her R chest and upper back.  She denies any sweating, fevers, chills, shortness of breath.  Denies any facial droop, numbness, tingling, weakness in other places.  She thinks her R arm may be [name] little weak.  She took 7 baby aspirin and came to hospital.      ED Course: Labs, imaging, EKG.  Tele neuro c/s.  Recommend inpatient stroke w/u.  Review of Systems: As per HPI otherwise 10 point review of systems negative.   Past Medical History:  Diagnosis Date   ADD (attention deficit disorder)    Anxiety    Arthritis    os   GERD (gastroesophageal reflux disease)    barretts   Headache    High cholesterol    Neck pain    Obesity    Personal history of radiation therapy 2005   Sleep apnea    cpap   last sleep study> 3 yrs   Swelling of both ankles     Past Surgical History:  Procedure Laterality Date   ABDOMINAL HYSTERECTOMY     ANTERIOR CERVICAL DECOMP/DISCECTOMY FUSION N/[name] 02/15/2016   Procedure: Cervical three four-Cervical five-six, Cervical six-seven Anterior cervical decompression/diskectomy/fusion;  Surgeon: Kristeen Miss, MD;  Location: [facility];  Service: Neurosurgery;  Laterality: N/[name];   bicept surgery     BREAST LUMPECTOMY     CHOLECYSTECTOMY     JOINT REPLACEMENT       PAROTID GLAND TUMOR EXCISION     TONSILLECTOMY     TOTAL KNEE ARTHROPLASTY  07/19/2012   Procedure: TOTAL KNEE ARTHROPLASTY;  Surgeon: Yvette Rack., MD;  Location: [facility];  Service: Orthopedics;  Laterality: Right;  right total knee arthroplasty   TUBAL LIGATION       reports that she quit smoking about 21 years ago. She has never used smokeless tobacco. She reports previous alcohol use. She reports that she does not use drugs.  Allergies  Allergen Reactions   Nickel Hives and Rash    Family History  Problem Relation Age of Onset   Breast cancer Maternal Aunt    Breast cancer Paternal Aunt    Lung cancer Mother        age 84   Colon cancer Father        age 18   Prior to Admission medications   Medication Sig Start Date End Date Taking? Authorizing Provider  Cholecalciferol (VITAMIN D3) 5000 units CAPS Take 5,000 Units by mouth daily.    Yes [provider]  citalopram (CELEXA) 40 MG tablet Take 40 mg by mouth daily.     Yes [provider]  diphenhydrAMINE (BENADRYL) 25 mg capsule Take 25 mg by mouth at bedtime.   Yes [provider]  ferrous sulfate 325 (65 FE) MG tablet  Take 325 mg by mouth daily with breakfast.   Yes [provider]  fexofenadine (ALLEGRA) 180 MG tablet Take 180 mg by mouth daily.    Yes [provider]  fluticasone (FLONASE) 50 MCG/ACT nasal spray Place 1 spray into the nose 2 (two) times daily.    Yes [provider]  furosemide (LASIX) 80 MG tablet Take 80 mg by mouth daily. 09/18/18  Yes [provider]  indomethacin (INDOCIN) 50 MG capsule Take 50 mg by mouth 2 (two) times daily with [name] meal.   Yes [provider]  methylphenidate (RITALIN) 10 MG tablet Take 10 mg by mouth daily as needed (focus).    Yes [provider]  nortriptyline (PAMELOR) 10 MG capsule TAKE 2 CAPSULES BY MOUTH AT BEDTIME Patient taking differently: Take 20 mg by mouth at bedtime.  09/30/18  Yes  Marcial Pacas, MD  pantoprazole (PROTONIX) 40 MG tablet Take 40 mg by mouth daily.   Yes [provider]  rizatriptan (MAXALT) 10 MG tablet Take 10 mg by mouth every 2 (two) hours as needed for migraine. Max of 3 tablets in Janan Bogie 24 hour period 08/30/18  Yes [provider]  simvastatin (ZOCOR) 20 MG tablet Take 20 mg by mouth at bedtime.    Yes [provider]  tizanidine (ZANAFLEX) 2 MG capsule TAKE 1 CAPSULE BY MOUTH THREE TIMES DAILY Patient taking differently: Take 2 mg by mouth 3 (three) times daily.  06/10/18  Yes Marcial Pacas, MD  butalbital-acetaminophen-caffeine (FIORICET, ESGIC) [phone] MG tablet Take 1 tablet by mouth every 6 (six) hours as needed for headache. Do not refill in less than 30 days Patient not taking: Reported on 01/30/2019 04/10/18   Marcial Pacas, MD  gabapentin (NEURONTIN) 300 MG capsule Take 2 capsules (600 mg total) by mouth 2 (two) times daily. Patient not taking: Reported on 01/30/2019 07/04/18   Marcial Pacas, MD  traMADol (ULTRAM) 50 MG tablet Take 1 tablet (50 mg total) by mouth every 12 (twelve) hours as needed (migraine). Patient not taking: Reported on 01/30/2019 04/25/18   Marcial Pacas, MD    Physical Exam: Vitals:   01/30/19 0730 01/30/19 0800 01/30/19 0830 01/30/19 0900  BP: (!) 174/90 (!) 167/84 (!) 150/77 (!) 168/93  Pulse: 74 80 67 71  Resp: 18 18 18 18   Temp:      TempSrc:      SpO2: 93% 93% 96% 96%  Weight:      Height:        Constitutional: NAD, calm, comfortable Vitals:   01/30/19 0730 01/30/19 0800 01/30/19 0830 01/30/19 0900  BP: (!) 174/90 (!) 167/84 (!) 150/77 (!) 168/93  Pulse: 74 80 67 71  Resp: 18 18 18 18   Temp:      TempSrc:      SpO2: 93% 93% 96% 96%  Weight:      Height:       Eyes: PERRL, lids and conjunctivae normal ENMT: Mucous membranes are moist. Posterior pharynx clear of any exudate or lesions.Normal dentition.  Neck: normal, supple, no masses, no thyromegaly Respiratory: clear to auscultation  bilaterally, no wheezing, no crackles. Normal respiratory effort. No accessory muscle use.  Cardiovascular: Regular rate and rhythm, no murmurs / rubs / gallops. 1+ LE edema. Abdomen: no tenderness, no masses palpated. No hepatosplenomegaly. Bowel sounds positive.  Musculoskeletal: no clubbing / cyanosis. No joint deformity upper and lower extremities. Good ROM, no contractures. Normal muscle tone.  Skin: no rashes, lesions, ulcers. No induration Neurologic:  CN 2-12 intact. R arm with decreased sensation. 4/5 strength to R arm.  Psychiatric: Normal judgment and insight. Alert and oriented x 3. Normal mood.   Labs on Admission: I have personally reviewed following labs and imaging studies  CBC: Recent Labs  Lab 01/30/19 0420 01/30/19 0502  WBC 7.7  --   NEUTROABS 5.3  --   HGB 11.4* 11.6*  HCT 36.5 34.0*  MCV 92.2  --   PLT 258  --    Basic Metabolic Panel: Recent Labs  Lab 01/30/19 0420 01/30/19 0502  NA 139 139  K 3.0* 3.1*  CL 103 103  CO2 25  --   GLUCOSE 137* 137*  BUN 18 16  CREATININE 1.35* 1.30*  CALCIUM 9.6  --    GFR: Estimated Creatinine Clearance: 49.6 mL/min ([place]) (by C-G formula based on SCr of 1.3 mg/dL (H)). Liver Function Tests: No results for input(s): AST, ALT, ALKPHOS, BILITOT, PROT, ALBUMIN in the last 168 hours. No results for input(s): LIPASE, AMYLASE in the last 168 hours. No results for input(s): AMMONIA in the last 168 hours. Coagulation Profile: Recent Labs  Lab 01/30/19 0450  INR 0.9   Cardiac Enzymes: No results for input(s): CKTOTAL, CKMB, CKMBINDEX, TROPONINI in the last 168 hours. BNP (last 3 results) No results for input(s): PROBNP in the last 8760 hours. HbA1C: No results for input(s): HGBA1C in the last 72 hours. CBG: No results for input(s): GLUCAP in the last 168 hours. Lipid Profile: No results for input(s): CHOL, HDL, LDLCALC, TRIG, CHOLHDL, LDLDIRECT in the last 72 hours. Thyroid Function Tests: No results for input(s):  TSH, T4TOTAL, FREET4, T3FREE, THYROIDAB in the last 72 hours. Anemia Panel: No results for input(s): VITAMINB12, FOLATE, FERRITIN, TIBC, IRON, RETICCTPCT in the last 72 hours. Urine analysis:    Component Value Date/Time   COLORURINE YELLOW 01/30/2019 0450   APPEARANCEUR CLEAR 01/30/2019 0450   LABSPEC 1.014 01/30/2019 0450   PHURINE 5.0 01/30/2019 0450   GLUCOSEU NEGATIVE 01/30/2019 0450   HGBUR NEGATIVE 01/30/2019 0450   BILIRUBINUR NEGATIVE 01/30/2019 0450   KETONESUR NEGATIVE 01/30/2019 0450   PROTEINUR NEGATIVE 01/30/2019 0450   UROBILINOGEN 1.0 07/11/2012 1023   NITRITE NEGATIVE 01/30/2019 0450   LEUKOCYTESUR NEGATIVE 01/30/2019 0450    Radiological Exams on Admission: Dg Chest 2 View  Result Date: 01/30/2019 CLINICAL DATA:  Chest pain EXAM: CHEST - 2 VIEW COMPARISON:  07/11/2012 FINDINGS: Normal heart size and mediastinal contours. Borderline hyperinflation, stable. There is no edema, consolidation, effusion, or pneumothorax. Artifact from EKG leads IMPRESSION: No evidence of active disease. Electronically Signed   By: Monte Fantasia M.D.   On: 01/30/2019 04:53   Ct Head Code Stroke Wo Contrast  Result Date: 01/30/2019 CLINICAL DATA:  Code stroke.  Right arm weakness and numbness EXAM: CT HEAD WITHOUT CONTRAST TECHNIQUE: Contiguous axial images were obtained from the base of the skull through the vertex without intravenous contrast. COMPARISON:  12/29/2015 FINDINGS: Brain: No evidence of acute infarction, hemorrhage, hydrocephalus, extra-axial collection or mass lesion/mass effect. Stable coarse calcification along the free edge of the tentorium. Vascular: No hyperdense vessel or unexpected calcification. Skull: Normal. Negative for fracture or focal lesion. Sinuses/Orbits: No acute finding. Other: These results were communicated to Dr. Cheral Marker at 5:07 amon 8/13/2020by text page via the [facility] messaging system. ASPECTS [facility] Stroke Program Early CT Score) - Ganglionic level  infarction (caudate, lentiform nuclei, internal capsule, insula, M1-M3 cortex): 7 - Supraganglionic infarction (M4-M6 cortex): 3 Total score (0-10 with 10 being  normal): 10 IMPRESSION: Negative head CT.  ASPECTS is 10. Electronically Signed   By: Monte Fantasia M.D.   On: 01/30/2019 05:08    EKG: Independently reviewed. NSR, appears similar to prior EKG's.   Assessment/Plan Active Problems:   Left arm numbness  Right Arm Numbness   Weakness: seen by tele neurology who recommend admission to hospital for stroke work up.  Ddx also includes cervical myelopathy.  She also had R sided CP, but EKG reassuring and negative troponins. Negative head CT Pending MRI brain and C spine Will order echo, carotid dopplers S/p ASA. Continue daily. Permissive hypertension for now A1c and lipids PT/OT/SLP Neuro c/s pending imaging addendum, discussed results of MRI with neuro (MRI brain without acute abnormality, C spine with potential R C8 nerve root impingement and focal myelomalacia in C5-6) recommended discussion with neurosurgery.  Neurosurgery c/s, Dr. Ellene Route will see patient in the AM.     Hypokalemia: replace and follow.  Follow mag.  HTN   Lower Extremity Swelling: hold lasix for now with permissive hypertension  GERD: continue PPI   Migraines: continue nortriptyline, hold indomethacin for now.    ADD: not taking ritalin anymore  HLD: continue simvastatin  Depression: continue celexa  OSA: cpap  DVT prophylaxis: lovenox  Code Status: full  Family Communication: husband at bedside Disposition Plan: pneidng  Consults called: tele neurology Admission status: observation    Fayrene Helper MD [facility] Pager AMION  If 7PM-7AM, please contact night-coverage [email or web address] Password [facility]  01/30/2019, 10:18 AM

## 2019-01-31 ENCOUNTER — Observation Stay (HOSPITAL_BASED_OUTPATIENT_CLINIC_OR_DEPARTMENT_OTHER): Payer: Medicare Other

## 2019-01-31 ENCOUNTER — Observation Stay (HOSPITAL_COMMUNITY): Payer: Medicare Other

## 2019-01-31 DIAGNOSIS — Z803 Family history of malignant neoplasm of breast: Secondary | ICD-10-CM | POA: Diagnosis not present

## 2019-01-31 DIAGNOSIS — K219 Gastro-esophageal reflux disease without esophagitis: Secondary | ICD-10-CM | POA: Diagnosis present

## 2019-01-31 DIAGNOSIS — K2270 Barrett's esophagus without dysplasia: Secondary | ICD-10-CM | POA: Diagnosis present

## 2019-01-31 DIAGNOSIS — E785 Hyperlipidemia, unspecified: Secondary | ICD-10-CM | POA: Diagnosis present

## 2019-01-31 DIAGNOSIS — F329 Major depressive disorder, single episode, unspecified: Secondary | ICD-10-CM | POA: Diagnosis present

## 2019-01-31 DIAGNOSIS — M5412 Radiculopathy, cervical region: Secondary | ICD-10-CM | POA: Diagnosis present

## 2019-01-31 DIAGNOSIS — R200 Anesthesia of skin: Secondary | ICD-10-CM | POA: Diagnosis not present

## 2019-01-31 DIAGNOSIS — M5013 Cervical disc disorder with radiculopathy, cervicothoracic region: Secondary | ICD-10-CM | POA: Diagnosis not present

## 2019-01-31 DIAGNOSIS — R29898 Other symptoms and signs involving the musculoskeletal system: Secondary | ICD-10-CM

## 2019-01-31 DIAGNOSIS — I10 Essential (primary) hypertension: Secondary | ICD-10-CM | POA: Diagnosis not present

## 2019-01-31 DIAGNOSIS — Z6841 Body Mass Index (BMI) 40.0 and over, adult: Secondary | ICD-10-CM | POA: Diagnosis not present

## 2019-01-31 DIAGNOSIS — Z801 Family history of malignant neoplasm of trachea, bronchus and lung: Secondary | ICD-10-CM | POA: Diagnosis not present

## 2019-01-31 DIAGNOSIS — D649 Anemia, unspecified: Secondary | ICD-10-CM

## 2019-01-31 DIAGNOSIS — F419 Anxiety disorder, unspecified: Secondary | ICD-10-CM | POA: Diagnosis present

## 2019-01-31 DIAGNOSIS — Z981 Arthrodesis status: Secondary | ICD-10-CM | POA: Diagnosis not present

## 2019-01-31 DIAGNOSIS — N183 Chronic kidney disease, stage 3 (moderate): Secondary | ICD-10-CM | POA: Diagnosis not present

## 2019-01-31 DIAGNOSIS — M4802 Spinal stenosis, cervical region: Secondary | ICD-10-CM | POA: Diagnosis present

## 2019-01-31 DIAGNOSIS — Z800 Family history of malignant neoplasm of digestive organs: Secondary | ICD-10-CM | POA: Diagnosis not present

## 2019-01-31 DIAGNOSIS — Z20828 Contact with and (suspected) exposure to other viral communicable diseases: Secondary | ICD-10-CM | POA: Diagnosis present

## 2019-01-31 DIAGNOSIS — G4733 Obstructive sleep apnea (adult) (pediatric): Secondary | ICD-10-CM | POA: Diagnosis present

## 2019-01-31 DIAGNOSIS — D631 Anemia in chronic kidney disease: Secondary | ICD-10-CM | POA: Diagnosis present

## 2019-01-31 DIAGNOSIS — E876 Hypokalemia: Secondary | ICD-10-CM | POA: Diagnosis present

## 2019-01-31 DIAGNOSIS — R531 Weakness: Secondary | ICD-10-CM | POA: Diagnosis present

## 2019-01-31 DIAGNOSIS — R7303 Prediabetes: Secondary | ICD-10-CM | POA: Diagnosis not present

## 2019-01-31 DIAGNOSIS — I129 Hypertensive chronic kidney disease with stage 1 through stage 4 chronic kidney disease, or unspecified chronic kidney disease: Secondary | ICD-10-CM | POA: Diagnosis present

## 2019-01-31 DIAGNOSIS — G43909 Migraine, unspecified, not intractable, without status migrainosus: Secondary | ICD-10-CM | POA: Diagnosis present

## 2019-01-31 DIAGNOSIS — M4804 Spinal stenosis, thoracic region: Secondary | ICD-10-CM | POA: Diagnosis present

## 2019-01-31 DIAGNOSIS — Z90710 Acquired absence of both cervix and uterus: Secondary | ICD-10-CM | POA: Diagnosis not present

## 2019-01-31 LAB — CBC
HCT: 33.2 % — ABNORMAL LOW (ref 36.0–46.0)
Hemoglobin: 10.6 g/dL — ABNORMAL LOW (ref 12.0–15.0)
MCH: 28.9 pg (ref 26.0–34.0)
MCHC: 31.9 g/dL (ref 30.0–36.0)
MCV: 90.5 fL (ref 80.0–100.0)
Platelets: 248 10*3/uL (ref 150–400)
RBC: 3.67 MIL/uL — ABNORMAL LOW (ref 3.87–5.11)
RDW: 14.4 % (ref 11.5–15.5)
WBC: 6.4 10*3/uL (ref 4.0–10.5)
nRBC: 0 % (ref 0.0–0.2)

## 2019-01-31 LAB — HEMOGLOBIN A1C
Hgb A1c MFr Bld: 6.1 % — ABNORMAL HIGH (ref 4.8–5.6)
Mean Plasma Glucose: 128.37 mg/dL

## 2019-01-31 LAB — ECHOCARDIOGRAM COMPLETE
Height: 64 in
Weight: 4158.76 oz

## 2019-01-31 LAB — COMPREHENSIVE METABOLIC PANEL
ALT: 14 U/L (ref 0–44)
AST: 15 U/L (ref 15–41)
Albumin: 3.4 g/dL — ABNORMAL LOW (ref 3.5–5.0)
Alkaline Phosphatase: 75 U/L (ref 38–126)
Anion gap: 9 (ref 5–15)
BUN: 17 mg/dL (ref 8–23)
CO2: 25 mmol/L (ref 22–32)
Calcium: 9.4 mg/dL (ref 8.9–10.3)
Chloride: 105 mmol/L (ref 98–111)
Creatinine, Ser: 1.41 mg/dL — ABNORMAL HIGH (ref 0.44–1.00)
GFR calc Af Amer: 43 mL/min — ABNORMAL LOW (ref >60)
GFR calc non Af Amer: 37 mL/min — ABNORMAL LOW (ref >60)
Glucose, Bld: 116 mg/dL — ABNORMAL HIGH (ref 70–99)
Potassium: 4 mmol/L (ref 3.5–5.1)
Sodium: 139 mmol/L (ref 135–145)
Total Bilirubin: 0.8 mg/dL (ref 0.3–1.2)
Total Protein: 6.1 g/dL — ABNORMAL LOW (ref 6.5–8.1)

## 2019-01-31 LAB — LIPID PANEL
Cholesterol: 158 mg/dL (ref 0–200)
HDL: 67 mg/dL (ref >40)
LDL Cholesterol: 72 mg/dL (ref 0–99)
Total CHOL/HDL Ratio: 2.4 RATIO
Triglycerides: 95 mg/dL (ref <150)
VLDL: 19 mg/dL (ref 0–40)

## 2019-01-31 MED ORDER — SODIUM CHLORIDE 0.9 % IV SOLN
INTRAVENOUS | Status: DC
  Administered 2019-01-31: 19:00:00 via INTRAVENOUS

## 2019-01-31 MED ORDER — DEXAMETHASONE SODIUM PHOSPHATE 4 MG/ML IJ SOLN
4.0000 mg | Freq: Two times a day (BID) | INTRAMUSCULAR | Status: DC
  Administered 2019-01-31 – 2019-02-01 (×2): 4 mg via INTRAVENOUS
  Filled 2019-01-31 (×2): qty 1

## 2019-01-31 MED ORDER — POTASSIUM CHLORIDE CRYS ER 20 MEQ PO TBCR
40.0000 meq | EXTENDED_RELEASE_TABLET | Freq: Once | ORAL | Status: AC
  Administered 2019-01-31: 02:00:00 40 meq via ORAL
  Filled 2019-01-31: qty 2

## 2019-01-31 MED ORDER — DEXAMETHASONE SODIUM PHOSPHATE 10 MG/ML IJ SOLN
10.0000 mg | Freq: Once | INTRAMUSCULAR | Status: AC
  Administered 2019-01-31: 10 mg via INTRAVENOUS
  Filled 2019-01-31: qty 1

## 2019-01-31 MED ORDER — SODIUM CHLORIDE 0.9 % IV SOLN: INTRAVENOUS | Status: DC

## 2019-01-31 NOTE — Progress Notes (Signed)
PROGRESS NOTE    [name]  HTD:[id number] DOB: [birth year] DOA: 01/30/2019 PCP: Leonard Downing, MD   Brief Narrative:  HPI per Dr. Alben Deeds on 01/30/2019 [name] is a 73 y.o. female with medical history significant of anxiety, GERD, migraines, OSA, obesity, C spine surgery presenting with sudden pain and numbness in her RUE.  She notes she went to bed around midnihgt last night and developed pain in her R arm.  This progressed down into her hand, spearding from her shoulder.  She noticed her arm and hand were numb around 2 oclock.  She also noticed pain in her R chest and upper back.  She denies any sweating, fevers, chills, shortness of breath.  Denies any facial droop, numbness, tingling, weakness in other places.  She thinks her R arm may be a little weak.  She took 7 baby aspirin and came to hospital.      **Interim History Continues to have numbness and decreased sensation in her right arm and fingers.  Neurosurgery Dr. Ellene Route to evaluate and recommends given IV steroids and started on IV Decadron 10 mg once and then will continue at 4 mg twice daily.  Further care per neurosurgery  Assessment & Plan:   Active Problems:   Right arm numbness  Right Arm Numbness   Weakness consistent with C8 radiculopathy -Seen by tele neurology who recommend admission to hospital for stroke work up.  Ddx also includes cervical myelopathy.   -She also had R sided CP, but EKG reassuring and negative troponins. -Negative head CT and MRI of the brain without contrast showed no acute intracranial abnormalities and minimal chronic small vessel ischemic disease -MRI cervical spine showed a motion degraded examination with interval C3-4 and C5-C7 ACDF without evidence of significant residual stenosis.  There is also progressive disc degeneration at C7 and T1 with mild spinal stenosis and moderate right neural foraminal stenosis and potential right C8 nerve root impingement.  There  is also mild spinal stenosis at C4-C5 and suspected focal myelomalacia in the spinal cord at C5-C6 -I spoke with Dr. Kristeen Miss who recommends starting the patient on IV steroids currently in recommending IV Decadron 10 mg once now and then 4 mg twice daily -Ordered echocardiogram and carotid Dopplers  -Cardiogram showed hyperdynamic systolic function and pseudonormalization of diastolic function and carotid arteries showed 1-39 stenosis bilaterally -We will change her to inpatient status as she will be crossing her second midnight tonight and because as Dr. Ellene Route recommends IV steroids today to see if her radiculopathy improves for safe discharge home hopefully tomorrow otherwise she will need surgical intervention given that her hand is still weak -Continue with aspirin 81 mg p.o. daily; if she worsens, or has any further deterioration of function or has worsening pain we will have neurosurgery evaluate further. -We will also start IV fluid hydration as below suspected AKI as well -Continue to monitor and reevaluate and hopefully discharge home in a.m. on a steroid taper if radiculopathy is improved somewhat but for now Dr. Loanne Drilling recommends IV steroids -PT and OT evaluated and OT recommending outpatient OT orders will be placed for [facility] neuro rehab  ?AKI but suspected CKD Stage 3 -Patient's BUN/Cr on Admission was 18/1.35 and repeat this AM was 17/1.41 -We will start gentle IV fluid hydration with normal saline at a rate of 75 mL's per hour -Avoid Nephrotoxic Medications, Contrast Dyes, and Hypotension if possible -We will hold the patient's Lasix and indomethacin currently -Continue  to Monitor and Trend Renal Fxn -Repeat CMP in AM   Normocytic Anemia/Anemia of Chronic Kidney Disease -Patient's Hb/Hct went from 11.4/36.5 on Admission and is now 10.6/33.2 -Check Anemia Panel in the AM -Continue with ferrous sulfate 325 mg p.o. daily with breakfast -Continue to Monitor for S/Sx of  Bleeding; Currently no overt bleeding noted -Repeat CBC in AM   Morbid Obesity -Estimated body mass index is 44.62 kg/m as calculated from the following:   Height as of this encounter: 5\' 4"  (1.626 m).   Weight as of this encounter: 117.9 kg. -Weight Loss and Dietary Counseling given   Obstructive Sleep Apnea -C/w CPAP qHS  HLD -Lipid Panel done and showed cholesterol/HDL ratio 2.4, cholesterol level 158, HDL level of 67, LDL 72, triglycerides 95, VLDL 19 -C/w Simvastatin 20 mg p.o. daily  GERD/Barretts Esophagus  -C/w PPI with Pantoprazole 40 mg p.o. daily  Hypokalemia -Improved after repletion and potassium is now 4.0 -Continue to monitor and replete as necessary -Repeat CMP in a.m.  HTN  Lower Extremity Swelling -hold lasix for now and started gentle IV fluid hydration as above -Blood pressure was 114/62 and on admission it was 204/110   Migraines -Continue Nortriptyline milligrams p.o. nightly along with Tizanidine 2 mg p.o. 3 times daily -hold indomethacin for now given AKI versus suspected CKD   ADD -Not taking ritalin anymore  Depression -Continue with citalopram 40 mg p.o. daily and Nortriptyline 20 g p.o. nightly  PreDiabetes -Hemoglobin A1c was 6.1 -We will need to continue monitor blood sugars extremely carefully not she is being started on IV steroids and likely will have elevated blood sugar secondary to steroids -Continue monitor and trend and may need to place the patient on sensitive NovoLog/scale insulin before meals and at bedtime  DVT prophylaxis: Enoxaparin 40 mg subcu every 24 Code Status: FULL CODE Family Communication: No family present at bedside Disposition Plan: Pending improvement of radiculopathy with IV steroids and clearance by neurosurgery and anticipate discharge home in the next 24 to 48 hours  Consultants:   TeleNeurology  Neurosurgery Dr. Ellene Route   Procedures:  ECHOCARDIOGRAM IMPRESSIONS    1. The left ventricle has  hyperdynamic systolic function, with an ejection fraction of >65%. The cavity size was normal. Left ventricular diastolic Doppler parameters are consistent with pseudonormalization. Elevated left ventricular end-diastolic  pressure No evidence of left ventricular regional wall motion abnormalities.  2. The right ventricle has normal systolic function. The cavity was normal. There is no increase in right ventricular wall thickness. Right ventricular systolic pressure could not be assessed.  3. Left atrial size was mildly dilated.  4. The aortic valve is tricuspid. Mild sclerosis of the aortic valve.  5. The aorta is normal unless otherwise noted.  FINDINGS  Left Ventricle: The left ventricle has hyperdynamic systolic function, with an ejection fraction of >65%. The cavity size was normal. There is no increase in left ventricular wall thickness. Left ventricular diastolic Doppler parameters are consistent  with pseudonormalization. Elevated left ventricular end-diastolic pressure No evidence of left ventricular regional wall motion abnormalities..  Right Ventricle: The right ventricle has normal systolic function. The cavity was normal. There is no increase in right ventricular wall thickness. Right ventricular systolic pressure could not be assessed.  Left Atrium: Left atrial size was mildly dilated.  Right Atrium: Right atrial size was normal in size. Right atrial pressure is estimated at 3 mmHg.  Interatrial Septum: No atrial level shunt detected by color flow Doppler.  Pericardium: There is  no evidence of pericardial effusion.  Mitral Valve: The mitral valve is normal in structure. Mitral valve regurgitation is not visualized by color flow Doppler.  Tricuspid Valve: The tricuspid valve is normal in structure. Tricuspid valve regurgitation was not visualized by color flow Doppler.  Aortic Valve: The aortic valve is tricuspid Mild sclerosis of the aortic valve. Aortic valve  regurgitation was not visualized by color flow Doppler.  Pulmonic Valve: The pulmonic valve was normal in structure. Pulmonic valve regurgitation is not visualized by color flow Doppler.  Aorta: The aorta is normal unless otherwise noted.  Venous: The inferior vena cava measures 1.89 cm, is normal in size with greater than 50% respiratory variability.    +--------------+--------++  LEFT VENTRICLE            +----------------+---------++ +--------------+--------++  Diastology                    PLAX 2D                   +----------------+---------++ +--------------+--------++  LV e' lateral:   5.98 cm/s    LVIDd:         4.52 cm    +----------------+---------++ +--------------+--------++  LV E/e' lateral: 15.6         LVIDs:         3.54 cm    +----------------+---------++ +--------------+--------++  LV e' medial:    6.64 cm/s    LV PW:         1.15 cm    +----------------+---------++ +--------------+--------++  LV E/e' medial:  14.0         LV IVS:        0.98 cm    +----------------+---------++ +--------------+--------++  LVOT diam:     1.90 cm    +--------------+--------++  LV SV:         41 ml      +--------------+--------++  LV SV Index:   17.33      +--------------+--------++  LVOT Area:     2.84 cm   +--------------+--------++                            +--------------+--------++  +---------------+----------++  RIGHT VENTRICLE              +---------------+----------++  RV Basal diam:  2.96 cm      +---------------+----------++  RV S prime:     14.70 cm/s   +---------------+----------++  TAPSE (M-mode): 1.9 cm       +---------------+----------++  +---------------+-------++-----------++  LEFT ATRIUM              Index         +---------------+-------++-----------++  LA diam:        4.70 cm  2.15 cm/m    +---------------+-------++-----------++  LA Vol (A2C):   58.1 ml  26.57 ml/m   +---------------+-------++-----------++  LA Vol (A4C):   61.1 ml  27.94  ml/m   +---------------+-------++-----------++  LA Biplane Vol: 61.0 ml  27.89 ml/m   +---------------+-------++-----------++ +------------+---------++-----------++  RIGHT ATRIUM            Index         +------------+---------++-----------++  RA Pressure: 3.00 mmHg                +------------+---------++-----------++  RA Area:     15.00 cm                +------------+---------++-----------++  RA Volume:  36.00 ml   16.46 ml/m   +------------+---------++-----------++  +------------+-----------++  AORTIC VALVE               +------------+-----------++  LVOT Vmax:   130.00 cm/s   +------------+-----------++  LVOT Vmean:  86.700 cm/s   +------------+-----------++  LVOT VTI:    0.292 m       +------------+-----------++   +-------------+-------++  AORTA                   +-------------+-------++  Ao Root diam: 2.60 cm   +-------------+-------++  Ao Asc diam:  2.90 cm   +-------------+-------++  +--------------+----------++ +---------------+---------++  MITRAL VALVE                 TRICUSPID VALVE             +--------------+----------++ +---------------+---------++  MV Area (PHT): 3.27 cm      Estimated RAP:  3.00 mmHg   +--------------+----------++ +---------------+---------++  MV PHT:        67.28 msec   +--------------+----------++ +--------------+-------+  MV Decel Time: 232 msec      SHUNTS                  +--------------+----------++ +--------------+-------+ +--------------+----------++  Systemic VTI:  0.29 m    MV E velocity: 93.10 cm/s   +--------------+-------+ +--------------+----------++  Systemic Diam: 1.90 cm   MV A velocity: 97.00 cm/s   +--------------+-------+ +--------------+----------++  MV E/A ratio:  0.96         +--------------+----------++  +---------+-------+  IVC                +---------+-------+  IVC diam: 1.89 cm  +---------+-------+  CAROTID DOPPLERS Right Carotid: Velocities in the right ICA are consistent with a 1-39%  stenosis.  Left Carotid: Velocities in the left ICA are consistent with a 1-39% stenosis.  Vertebrals: Bilateral vertebral arteries demonstrate antegrade flow.    Antimicrobials:  Anti-infectives (From admission, onward)   None     Subjective: Seen and examined at bedside and states that her right arm was extremely numb and she states that she felt extremely weak.  No nausea or vomiting.  States that she has been taking furosemide and indomethacin for over 6 months now.  I stopped these and discussed with her that she may have chronic kidney disease secondary to these and will give her IV fluid hydration.  Neurosurgery to evaluate later on today.  No other concerns complaints at this time  Objective: Vitals:   01/30/19 2333 01/31/19 0024 01/31/19 0419 01/31/19 0500  BP:  139/76 121/76   Pulse: 66 73 62   Resp: 16 18 20    Temp:  98.4 F (36.9 C) 98.2 F (36.8 C)   TempSrc:  Oral Oral   SpO2: 98% 97% 96%   Weight:    117.9 kg  Height:       No intake or output data in the 24 hours ending 01/31/19 0812 Filed Weights   01/30/19 0410 01/31/19 0500  Weight: 115.7 kg 117.9 kg   Examination: Physical Exam:  Constitutional: WN/WD morbidly obese Caucasian female in NAD and appears calm and comfortable Eyes: Lids and conjunctivae normal, sclerae anicteric  ENMT: External Ears, Nose appear normal. Grossly normal hearing. Mucous membranes are moist. Neck: Appears normal, supple, no cervical masses, normal ROM, no appreciable thyromegaly; noJVD Respiratory: Diminished to auscultation bilaterally, no wheezing, rales, rhonchi or crackles. Normal respiratory effort and patient is not tachypenic. No accessory muscle use.  Cardiovascular:  RRR, no murmurs / rubs / gallops. S1 and S2 auscultated. Trace LE extremity edema. 2+ pedal pulses. No carotid bruits.  Abdomen: Soft, non-tender, non-distended. No masses palpated. No appreciable hepatosplenomegaly. Bowel sounds positive x4.  GU:  Deferred. Musculoskeletal: No clubbing / cyanosis of digits/nails. No joint deformity upper and lower extremities.  Right arm is weaker and right hand is weaker than the left Skin: No rashes, lesions, ulcers on a limited skin evaluation. No induration; Warm and dry.  Neurologic: CN 2-12 grossly intact with no focal deficits.  She has diminished sensation on her right arm  Psychiatric: Normal judgment and insight. Alert and oriented x 3. Anxious mood and appropriate affect.   Data Reviewed: I have personally reviewed following labs and imaging studies  CBC: Recent Labs  Lab 01/30/19 0420 01/30/19 0502 01/30/19 1448 01/31/19 0426  WBC 7.7  --  6.1 6.4  NEUTROABS 5.3  --   --   --   HGB 11.4* 11.6* 10.9* 10.6*  HCT 36.5 34.0* 34.6* 33.2*  MCV 92.2  --  90.1 90.5  PLT 258  --  255 947   Basic Metabolic Panel: Recent Labs  Lab 01/30/19 0420 01/30/19 0502 01/30/19 0609 01/30/19 1448 01/31/19 0426  NA 139 139  --   --  139  K 3.0* 3.1*  --   --  4.0  CL 103 103  --   --  105  CO2 25  --   --   --  25  GLUCOSE 137* 137*  --   --  116*  BUN 18 16  --   --  17  CREATININE 1.35* 1.30*  --  1.39* 1.41*  CALCIUM 9.6  --   --   --  9.4  MG  --   --  2.1  --   --    GFR: Estimated Creatinine Clearance: 46.2 mL/min (A) (by C-G formula based on SCr of 1.41 mg/dL (H)). Liver Function Tests: Recent Labs  Lab 01/31/19 0426  AST 15  ALT 14  ALKPHOS 75  BILITOT 0.8  PROT 6.1*  ALBUMIN 3.4*   No results for input(s): LIPASE, AMYLASE in the last 168 hours. No results for input(s): AMMONIA in the last 168 hours. Coagulation Profile: Recent Labs  Lab 01/30/19 0450  INR 0.9   Cardiac Enzymes: No results for input(s): CKTOTAL, CKMB, CKMBINDEX, TROPONINI in the last 168 hours. BNP (last 3 results) No results for input(s): PROBNP in the last 8760 hours. HbA1C: Recent Labs    01/31/19 0426  HGBA1C 6.1*   CBG: No results for input(s): GLUCAP in the last 168 hours. Lipid  Profile: Recent Labs    01/31/19 0426  CHOL 158  HDL 67  LDLCALC 72  TRIG 95  CHOLHDL 2.4   Thyroid Function Tests: No results for input(s): TSH, T4TOTAL, FREET4, T3FREE, THYROIDAB in the last 72 hours. Anemia Panel: No results for input(s): VITAMINB12, FOLATE, FERRITIN, TIBC, IRON, RETICCTPCT in the last 72 hours. Sepsis Labs: No results for input(s): PROCALCITON, LATICACIDVEN in the last 168 hours.  Recent Results (from the past 240 hour(s))  SARS CORONAVIRUS 2 Nasal Swab Urine, Clean Catch     Status: None   Collection Time: 01/30/19  5:53 AM   Specimen: Urine, Clean Catch; Nasal Swab  Result Value Ref Range Status   SARS Coronavirus 2 NEGATIVE NEGATIVE Final    Comment: (NOTE) SARS-CoV-2 target nucleic acids are NOT DETECTED. The SARS-CoV-2 RNA is generally detectable in upper and lower  respiratory specimens during the acute phase of infection. Negative results do not preclude SARS-CoV-2 infection, do not rule out co-infections with other pathogens, and should not be used as the sole basis for treatment or other patient management decisions. Negative results must be combined with clinical observations, patient history, and epidemiological information. The expected result is Negative. Fact Sheet for Patients: SugarRoll.be Fact Sheet for Healthcare Providers: https://www.woods-mathews.com/ This test is not yet approved or cleared by the [place] FDA and  has been authorized for detection and/or diagnosis of SARS-CoV-2 by FDA under an Emergency Use Authorization (EUA). This EUA will remain  in effect (meaning this test can be used) for the duration of the COVID-19 declaration under Section 56 4(b)(1) of the Act, 21 U.S.C. section 360bbb-3(b)(1), unless the authorization is terminated or revoked sooner. Performed at [facility], [place] [address] [place], [place] [address]     Radiology Studies: Dg Chest 2  View  Result Date: 01/30/2019 CLINICAL DATA:  Chest pain EXAM: CHEST - 2 VIEW COMPARISON:  07/11/2012 FINDINGS: Normal heart size and mediastinal contours. Borderline hyperinflation, stable. There is no edema, consolidation, effusion, or pneumothorax. Artifact from EKG leads IMPRESSION: No evidence of active disease. Electronically Signed   By: Monte Fantasia M.D.   On: 01/30/2019 04:53   Mr Brain Wo Contrast  Result Date: 01/30/2019 CLINICAL DATA:  Right arm pain extending across the chest and into the back. Right arm numbness as well. EXAM: MRI HEAD WITHOUT CONTRAST TECHNIQUE: Multiplanar, multiecho pulse sequences of the brain and surrounding structures were obtained without intravenous contrast. COMPARISON:  Head CT 01/30/2019 FINDINGS: Brain: There is no evidence of acute infarct, intracranial hemorrhage, mass, midline shift, or extra-axial fluid collection. Minimal T2 hyperintensity in the cerebral white matter and pons is nonspecific but compatible with chronic small vessel ischemic disease for age. The ventricles and sulci are within normal limits for age. Vascular: Major intracranial vascular flow voids are preserved. Skull and upper cervical spine: Unremarkable bone marrow signal. Anterior cervical spine fusion reported separately. Sinuses/Orbits: Unremarkable orbits. Small right mastoid effusion. Clear paranasal sinuses. Other: None. IMPRESSION: 1. No acute intracranial abnormality. 2. Minimal chronic small vessel ischemic disease. Electronically Signed   By: Logan Bores M.D.   On: 01/30/2019 12:48   Mr Cervical Spine Wo Contrast  Result Date: 01/30/2019 CLINICAL DATA:  Radiculopathy. Right arm pain radiating across the chest and back. EXAM: MRI CERVICAL SPINE WITHOUT CONTRAST TECHNIQUE: Multiplanar, multisequence MR imaging of the cervical spine was performed. No intravenous contrast was administered. COMPARISON:  01/05/2016 FINDINGS: The study is motion degraded with moderate artifact on the  axial sequences. Alignment: Cervical spine straightening.  No listhesis. Vertebrae: Interval C3-4 and C5-C7 ACDF with suspected solid osseous fusion at each level. No evidence of fracture, significant marrow edema, or suspicious marrow lesion. Cord: Suspected punctate focus of T2 hyperintensity in the left lateral cord at C5-6, likely myelomalacia related to prior spondylosis and spinal stenosis. Posterior Fossa, vertebral arteries, paraspinal tissues: Patchy T2 hyperintensity in the pons, more fully evaluated on separate brain MRI. Preserved vertebral artery flow voids. Disc levels: The cervical spinal canal is diffusely small on a congenital basis. C2-3: Mild right and moderate left facet arthrosis and shallow right paracentral disc osteophyte complex without evidence of significant stenosis. C3-4: Interval ACDF.  No evidence of significant residual stenosis. C4-5: Minimal disc bulging, minimal endplate spurring, ligamentum flavum thickening, and mild facet arthrosis result in mild spinal stenosis. No evidence of compressive neural foraminal stenosis.  C5-6: Interval ACDF.  No evidence of significant residual stenosis. C6-7: Interval ACDF.  No evidence of significant residual stenosis. C7-T1: Progressive disc degeneration. Mild disc bulging, a right paracentral disc protrusion, right greater than left uncovertebral spurring, and mild right and moderate left facet arthrosis result in mild spinal stenosis and moderate right and mild left neural foraminal stenosis with potential right C8 nerve root impingement. This level is poorly evaluated on axial imaging. IMPRESSION: 1. Motion degraded examination. 2. Interval C3-4 and C5-C7 ACDF without evidence of significant residual stenosis. 3. Progressive disc degeneration at C7-T1 with mild spinal stenosis and moderate right neural foraminal stenosis. Potential right C8 nerve root impingement. 4. Mild spinal stenosis at C4-5. 5. Suspected focal myelomalacia in the spinal  cord at C5-6. Electronically Signed   By: Logan Bores M.D.   On: 01/30/2019 12:41   Ct Head Code Stroke Wo Contrast  Result Date: 01/30/2019 CLINICAL DATA:  Code stroke.  Right arm weakness and numbness EXAM: CT HEAD WITHOUT CONTRAST TECHNIQUE: Contiguous axial images were obtained from the base of the skull through the vertex without intravenous contrast. COMPARISON:  12/29/2015 FINDINGS: Brain: No evidence of acute infarction, hemorrhage, hydrocephalus, extra-axial collection or mass lesion/mass effect. Stable coarse calcification along the free edge of the tentorium. Vascular: No hyperdense vessel or unexpected calcification. Skull: Normal. Negative for fracture or focal lesion. Sinuses/Orbits: No acute finding. Other: These results were communicated to Dr. Cheral Marker at 5:07 amon 8/13/2020by text page via the [facility] messaging system. ASPECTS [facility] Stroke Program Early CT Score) - Ganglionic level infarction (caudate, lentiform nuclei, internal capsule, insula, M1-M3 cortex): 7 - Supraganglionic infarction (M4-M6 cortex): 3 Total score (0-10 with 10 being normal): 10 IMPRESSION: Negative head CT.  ASPECTS is 10. Electronically Signed   By: Monte Fantasia M.D.   On: 01/30/2019 05:08   Scheduled Meds:   stroke: mapping our early stages of recovery book   Does not apply Once   aspirin  81 mg Oral Daily   citalopram  40 mg Oral Daily   diphenhydrAMINE  25 mg Oral QHS   enoxaparin (LOVENOX) injection  40 mg Subcutaneous Q24H   ferrous sulfate  325 mg Oral Q breakfast   furosemide  80 mg Oral Daily   indomethacin  50 mg Oral BID WC   loratadine  10 mg Oral Daily   nortriptyline  20 mg Oral QHS   pantoprazole  40 mg Oral Daily   simvastatin  20 mg Oral Daily   tiZANidine  2 mg Oral TID   Continuous Infusions:   LOS: 0 days   Kerney Elbe, DO [facility] PAGER is on AMION  If 7PM-7AM, please contact night-coverage [email or web address] Password [facility] 01/31/2019, 8:12 AM

## 2019-01-31 NOTE — Progress Notes (Signed)
Occupational Therapy Treatment Patient Details Name: [name] MRN: [id number] DOB: 1945-12-20 Today's Date: 01/31/2019    History of present illness Pt is a 73 yo female s/p R arm numbness and pain with decreased coordination. 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 MRI Brain: negative for acute abnormalities with chronic small vessel diseases. PMHx: knee repalcement.   OT comments  Pt performing HEP for RUE fine motor coordination. Pt performing handwriting tasks with RUE with legible handwriting. Pt continues to have sensation deficits in RUE. 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 Pt continues to progress and would benefit from continued OT skilled services for ADL and fine motor coordination. OT to follow. OP OT recommended. Neurosurgery consulted for possible impingement.     Follow Up Recommendations  Outpatient OT    Equipment Recommendations  None recommended by OT    Recommendations for Other Services      Precautions / Restrictions Precautions Precautions: None Restrictions Weight Bearing Restrictions: No       Mobility Bed Mobility Overal bed mobility: Modified Independent                Transfers Overall transfer level: Modified independent               General transfer comment: No dizziness reported throughout session    Balance Overall balance assessment: Modified Independent                                         ADL either performed or assessed with clinical judgement   ADL Overall ADL's : At baseline                                       General ADL Comments: Pt went over hand writing and fine motor coordination tasks     Vision   Vision Assessment?: No apparent visual deficits   Perception     Praxis      Cognition Arousal/Alertness: Awake/alert Behavior During Therapy: WFL for tasks assessed/performed Overall Cognitive Status: Within Functional Limits  for tasks assessed                                          Exercises Other Exercises Other Exercises: fine motor coordination exercises with handout Other Exercises: theraputty exercises with handout provided   Shoulder Instructions       General Comments      Pertinent Vitals/ Pain       Pain Assessment: No/denies pain  Home Living                                          Prior Functioning/Environment              Frequency  Min 2X/week        Progress Toward Goals  OT Goals(current goals can now be found in the care plan section)  Progress towards OT goals: Progressing toward goals  Acute Rehab OT Goals Patient Stated Goal: to get my right hand back OT Goal Formulation: With patient Time For Goal Achievement: 02/13/19 Potential to Achieve Goals: Good ADL  Goals Pt/caregiver will Perform Home Exercise Program: Increased strength;Right Upper extremity;With written HEP provided Additional ADL Goal #1: Pt will increase to good fine motor coordination with ADL tasks using RUE.  Plan Discharge plan needs to be updated    Co-evaluation                 AM-PAC OT "6 Clicks" Daily Activity     Outcome Measure   Help from another person eating meals?: None Help from another person taking care of personal grooming?: None Help from another person toileting, which includes using toliet, bedpan, or urinal?: A Little Help from another person bathing (including washing, rinsing, drying)?: A Little Help from another person to put on and taking off regular upper body clothing?: None Help from another person to put on and taking off regular lower body clothing?: A Little 6 Click Score: 21    End of Session    OT Visit Diagnosis: Pain;Muscle weakness (generalized) (M62.81) Pain - Right/Left: Right Pain - part of body: Arm   Activity Tolerance Patient tolerated treatment well   Patient Left in bed;with call bell/phone within  reach   Nurse Communication Mobility status        Time: 1141-1156 OT Time Calculation (min): 15 min  Charges: OT General Charges $OT Visit: 1 Visit OT Treatments $Therapeutic Exercise: 8-22 mins  Darryl Nestle) Marsa Aris OTR/L Acute Rehabilitation Services Pager: [phone] Office: [place] 01/31/2019, 5:10 PM

## 2019-01-31 NOTE — Care Management Obs Status (Signed)
[place] NOTIFICATION   Patient Details  Name: [name] MRN: [id number] Date of Birth: April 29, 1946   Medicare Observation Status Notification Given:  Yes    Pollie Friar, RN 01/31/2019, 3:32 PM

## 2019-01-31 NOTE — Progress Notes (Signed)
PT Cancellation/Discharge Note  Patient Details Name: [name] MRN: [id number] DOB: 02-Mar-1946   Cancelled Treatment:    Reason Eval/Treat Not Completed: PT screened, no needs identified, will sign off.  OT screened for PT needs.  No needs identified.  See OT note for details re: mobility.  PT to sign off.   Thanks,  Barbarann Ehlers. Alicha Raspberry, PT, DPT  Acute Rehabilitation [phone] pager [phone] office  @ [facility]: [phone]     Harvie Heck 01/31/2019, 11:46 AM

## 2019-01-31 NOTE — Progress Notes (Signed)
  Echocardiogram 2D Echocardiogram has been performed.  Kristina Bertone G Aubrei Bouchie 01/31/2019, 11:08 AM

## 2019-01-31 NOTE — Consult Note (Addendum)
Reason for Consult: Right arm weakness and pain Referring Physician: Dr. Adan Sis, DO  [name] is an 73 y.o. female.  HPI: [name] is a 73 year old individual whose had a previous anterior cervical decompression and fusion from C3-C6 on early Thursday morning she developed significant pain and weakness in the right arm.  She was not certain whether she was having a stroke or whether she could be having some heart disease and she called 911.  She was taken to [facility] and ultimately an MRI of the cervical spine was performed which demonstrates presence of her previous fusion plus some additional spondylitic disease at C6-7 and C7-T1 it appears at C7-T1 that there is a laterally placed mass in the foramen which is consistent with a disc herniation she was given some pain medication and we are consulted yesterday however it was in the afternoon and I did not see the patient until this afternoon.  At the current time she notes that her pain is better control but she finds that the hand weakness is quite significant she is right-hand dominant and has great difficulty extending the fingers and grasping adequately.  I noted the findings of the MRI to her.  Past Medical History:  Diagnosis Date  . ADD (attention deficit disorder)   . Anxiety   . Arthritis    os  . GERD (gastroesophageal reflux disease)    barretts  . Headache   . High cholesterol   . Neck pain   . Obesity   . Personal history of radiation therapy 2005  . Sleep apnea    cpap   last sleep study> 3 yrs  . Swelling of both ankles     Past Surgical History:  Procedure Laterality Date  . ABDOMINAL HYSTERECTOMY    . ANTERIOR CERVICAL DECOMP/DISCECTOMY FUSION N/A 02/15/2016   Procedure: Cervical three four-Cervical five-six, Cervical six-seven Anterior cervical decompression/diskectomy/fusion;  Surgeon: Kristeen Miss, MD;  Location: [facility];  Service: Neurosurgery;  Laterality: N/A;  . bicept surgery     . BREAST LUMPECTOMY    . CHOLECYSTECTOMY    . JOINT REPLACEMENT    . PAROTID GLAND TUMOR EXCISION    . TONSILLECTOMY    . TOTAL KNEE ARTHROPLASTY  07/19/2012   Procedure: TOTAL KNEE ARTHROPLASTY;  Surgeon: Yvette Rack., MD;  Location: [facility];  Service: Orthopedics;  Laterality: Right;  right total knee arthroplasty  . TUBAL LIGATION      Family History  Problem Relation Age of Onset  . Breast cancer Maternal Aunt   . Breast cancer Paternal Aunt   . Lung cancer Mother        age 17  . Colon cancer Father        age 29    Social History:  reports that she quit smoking about 21 years ago. She has never used smokeless tobacco. She reports previous alcohol use. She reports that she does not use drugs.  Allergies:  Allergies  Allergen Reactions  . Nickel Hives and Rash    Medications: I have reviewed the patient's current medications.  Results for orders placed or performed during the hospital encounter of 01/30/19 (from the past 48 hour(s))  Basic metabolic panel     Status: Abnormal   Collection Time: 01/30/19  4:20 AM  Result Value Ref Range   Sodium 139 135 - 145 mmol/L   Potassium 3.0 (L) 3.5 - 5.1 mmol/L   Chloride 103 98 - 111 mmol/L  CO2 25 22 - 32 mmol/L   Glucose, Bld 137 (H) 70 - 99 mg/dL   BUN 18 8 - 23 mg/dL   Creatinine, Ser 1.35 (H) 0.44 - 1.00 mg/dL   Calcium 9.6 8.9 - 10.3 mg/dL   GFR calc non Af Amer 39 (L) >60 mL/min   GFR calc Af Amer 46 (L) >60 mL/min   Anion gap 11 5 - 15    Comment: Performed at [facility], [place] [address] [place], [place] [address]  CBC     Status: Abnormal   Collection Time: 01/30/19  4:20 AM  Result Value Ref Range   WBC 7.7 4.0 - 10.5 K/uL   RBC 3.96 3.87 - 5.11 MIL/uL   Hemoglobin 11.4 (L) 12.0 - 15.0 g/dL   HCT 36.5 36.0 - 46.0 %   MCV 92.2 80.0 - 100.0 fL   MCH 28.8 26.0 - 34.0 pg   MCHC 31.2 30.0 - 36.0 g/dL   RDW 14.5 11.5 - 15.5 %   Platelets 258 150 - 400 K/uL   nRBC 0.0 0.0 - 0.2 %     Comment: Performed at [facility], [place] [address], [place], [place] [address]  Troponin I (High Sensitivity)     Status: None   Collection Time: 01/30/19  4:20 AM  Result Value Ref Range   Troponin I (High Sensitivity) 5 <18 ng/L    Comment: (NOTE) Elevated high sensitivity troponin I (hsTnI) values and significant  changes across serial measurements may suggest ACS but many other  chronic and acute conditions are known to elevate hsTnI results.  Refer to the "Links" section for chest pain algorithms and additional  guidance. Performed at [facility], [place] [address], [place], [place] [address]   Differential     Status: None   Collection Time: 01/30/19  4:20 AM  Result Value Ref Range   Neutrophils Relative % 69 %   Neutro Abs 5.3 1.7 - 7.7 K/uL   Lymphocytes Relative 19 %   Lymphs Abs 1.4 0.7 - 4.0 K/uL   Monocytes Relative 8 %   Monocytes Absolute 0.6 0.1 - 1.0 K/uL   Eosinophils Relative 3 %   Eosinophils Absolute 0.2 0.0 - 0.5 K/uL   Basophils Relative 1 %   Basophils Absolute 0.1 0.0 - 0.1 K/uL    Comment: Performed at [facility], [place] [address] [place], [place] [address]  Protime-INR     Status: None   Collection Time: 01/30/19  4:50 AM  Result Value Ref Range   Prothrombin Time 12.1 11.4 - 15.2 seconds   INR 0.9 0.8 - 1.2    Comment: (NOTE) INR goal varies based on device and disease states. Performed at [facility], [place] [address], [place], [place] [address]   APTT     Status: None   Collection Time: 01/30/19  4:50 AM  Result Value Ref Range   aPTT 32 24 - 36 seconds    Comment: Performed at [facility], [place] [address] [place],  [address]  Urine rapid drug screen (hosp performed)     Status: None   Collection Time: 01/30/19  4:50 AM  Result Value Ref Range   Opiates NONE DETECTED NONE DETECTED   Cocaine NONE DETECTED NONE DETECTED   Benzodiazepines NONE  DETECTED NONE DETECTED   Amphetamines NONE DETECTED NONE DETECTED   Tetrahydrocannabinol NONE DETECTED NONE DETECTED   Barbiturates NONE DETECTED NONE DETECTED  Comment: (NOTE) DRUG SCREEN FOR MEDICAL PURPOSES ONLY.  IF CONFIRMATION IS NEEDED FOR ANY PURPOSE, NOTIFY LAB WITHIN 5 DAYS. LOWEST DETECTABLE LIMITS FOR URINE DRUG SCREEN Drug Class                     Cutoff (ng/mL) Amphetamine and metabolites    1000 Barbiturate and metabolites    200 Benzodiazepine                 096 Tricyclics and metabolites     300 Opiates and metabolites        300 Cocaine and metabolites        300 THC                            50 Performed at [facility], [place] [address], [place], [place] [address]   Urinalysis, Routine w reflex microscopic     Status: None   Collection Time: 01/30/19  4:50 AM  Result Value Ref Range   Color, Urine YELLOW YELLOW   APPearance CLEAR CLEAR   Specific Gravity, Urine 1.014 1.005 - 1.030   pH 5.0 5.0 - 8.0   Glucose, UA NEGATIVE NEGATIVE mg/dL   Hgb urine dipstick NEGATIVE NEGATIVE   Bilirubin Urine NEGATIVE NEGATIVE   Ketones, ur NEGATIVE NEGATIVE mg/dL   Protein, ur NEGATIVE NEGATIVE mg/dL   Nitrite NEGATIVE NEGATIVE   Leukocytes,Ua NEGATIVE NEGATIVE    Comment: Performed at [place] [address] [place], [place] [address]  Ethanol     Status: None   Collection Time: 01/30/19  4:53 AM  Result Value Ref Range   Alcohol, Ethyl (B) <10 <10 mg/dL    Comment: (NOTE) Lowest detectable limit for serum alcohol is 10 mg/dL. For medical purposes only. Performed at [facility], [place] [address] [place], [place] [address]   I-stat chem 8, ED     Status: Abnormal   Collection Time: 01/30/19  5:02 AM  Result Value Ref Range   Sodium 139 135 - 145 mmol/L   Potassium 3.1 (L) 3.5 - 5.1 mmol/L   Chloride 103 98 - 111 mmol/L   BUN 16 8 - 23 mg/dL   Creatinine, Ser 1.30 (H) 0.44 - 1.00 mg/dL    Glucose, Bld 137 (H) 70 - 99 mg/dL   Calcium, Ion 1.17 1.15 - 1.40 mmol/L   TCO2 28 22 - 32 mmol/L   Hemoglobin 11.6 (L) 12.0 - 15.0 g/dL   HCT 34.0 (L) 36.0 - 46.0 %  SARS CORONAVIRUS 2 Nasal Swab Urine, Clean Catch     Status: None   Collection Time: 01/30/19  5:53 AM   Specimen: Urine, Clean Catch; Nasal Swab  Result Value Ref Range   SARS Coronavirus 2 NEGATIVE NEGATIVE    Comment: (NOTE) SARS-CoV-2 target nucleic acids are NOT DETECTED. The SARS-CoV-2 RNA is generally detectable in upper and lower respiratory specimens during the acute phase of infection. Negative results do not preclude SARS-CoV-2 infection, do not rule out co-infections with other pathogens, and should not be used as the sole basis for treatment or other patient management decisions. Negative results must be combined with clinical observations, patient history, and epidemiological information. The expected result is Negative. Fact Sheet for Patients: SugarRoll.be Fact Sheet for Healthcare Providers: https://www.woods-mathews.com/ This test is not yet approved or cleared by the [place] FDA and  has been authorized for detection and/or diagnosis of  SARS-CoV-2 by FDA under an Emergency Use Authorization (EUA). This EUA will remain  in effect (meaning this test can be used) for the duration of the COVID-19 declaration under Section 56 4(b)(1) of the Act, 21 U.S.C. section 360bbb-3(b)(1), unless the authorization is terminated or revoked sooner. Performed at [facility], [place] [address] [place], [place] [address]   Troponin I (High Sensitivity)     Status: None   Collection Time: 01/30/19  6:09 AM  Result Value Ref Range   Troponin I (High Sensitivity) 7 <18 ng/L    Comment: (NOTE) Elevated high sensitivity troponin I (hsTnI) values and significant  changes across serial measurements may suggest ACS but many other  chronic and acute conditions are known  to elevate hsTnI results.  Refer to the "Links" section for chest pain algorithms and additional  guidance. Performed at [facility], [place] [address] [place], [place] [address]   Magnesium     Status: None   Collection Time: 01/30/19  6:09 AM  Result Value Ref Range   Magnesium 2.1 1.7 - 2.4 mg/dL    Comment: Performed at [facility], [place] [address], [place], [place] [address]  CBC     Status: Abnormal   Collection Time: 01/30/19  2:48 PM  Result Value Ref Range   WBC 6.1 4.0 - 10.5 K/uL   RBC 3.84 (L) 3.87 - 5.11 MIL/uL   Hemoglobin 10.9 (L) 12.0 - 15.0 g/dL   HCT 34.6 (L) 36.0 - 46.0 %   MCV 90.1 80.0 - 100.0 fL   MCH 28.4 26.0 - 34.0 pg   MCHC 31.5 30.0 - 36.0 g/dL   RDW 14.4 11.5 - 15.5 %   Platelets 255 150 - 400 K/uL   nRBC 0.0 0.0 - 0.2 %    Comment: Performed at [facility], [place] [address] [place], [place] [address]  Creatinine, serum     Status: Abnormal   Collection Time: 01/30/19  2:48 PM  Result Value Ref Range   Creatinine, Ser 1.39 (H) 0.44 - 1.00 mg/dL   GFR calc non Af Amer 38 (L) >60 mL/min   GFR calc Af Amer 44 (L) >60 mL/min    Comment: Performed at [place] [address], [place], [place] [address]  Comprehensive metabolic panel     Status: Abnormal   Collection Time: 01/31/19  4:26 AM  Result Value Ref Range   Sodium 139 135 - 145 mmol/L   Potassium 4.0 3.5 - 5.1 mmol/L   Chloride 105 98 - 111 mmol/L   CO2 25 22 - 32 mmol/L   Glucose, Bld 116 (H) 70 - 99 mg/dL   BUN 17 8 - 23 mg/dL   Creatinine, Ser 1.41 (H) 0.44 - 1.00 mg/dL   Calcium 9.4 8.9 - 10.3 mg/dL   Total Protein 6.1 (L) 6.5 - 8.1 g/dL   Albumin 3.4 (L) 3.5 - 5.0 g/dL   AST 15 15 - 41 U/L   ALT 14 0 - 44 U/L   Alkaline Phosphatase 75 38 - 126 U/L   Total Bilirubin 0.8 0.3 - 1.2 mg/dL   GFR calc non Af Amer 37 (L) >60 mL/min   GFR calc Af Amer 43 (L) >60 mL/min   Anion gap 9 5 - 15    Comment: Performed at [facility] [address] [place], [place] [address]  CBC     Status: Abnormal   Collection Time:  01/31/19  4:26 AM  Result Value Ref Range   WBC 6.4 4.0 - 10.5 K/uL   RBC 3.67 (L) 3.87 - 5.11 MIL/uL   Hemoglobin 10.6 (L) 12.0 - 15.0 g/dL   HCT 33.2 (L) 36.0 - 46.0 %   MCV 90.5 80.0 - 100.0 fL   MCH 28.9 26.0 - 34.0 pg   MCHC 31.9 30.0 - 36.0 g/dL   RDW 14.4 11.5 - 15.5 %   Platelets 248 150 - 400 K/uL   nRBC 0.0 0.0 - 0.2 %    Comment: Performed at [facility], [place] [address], [place], [place] [address]  Hemoglobin A1c     Status: Abnormal   Collection Time: 01/31/19  4:26 AM  Result Value Ref Range   Hgb A1c MFr Bld 6.1 (H) 4.8 - 5.6 %    Comment: (NOTE) Pre diabetes:          5.7%-6.4% Diabetes:              >6.4% Glycemic control for   <7.0% adults with diabetes    Mean Plasma Glucose 128.37 mg/dL    Comment: Performed at [place] [address], [place], [place] [address]  Lipid panel     Status: None   Collection Time: 01/31/19  4:26 AM  Result Value Ref Range   Cholesterol 158 0 - 200 mg/dL   Triglycerides 95 <150 mg/dL   HDL 67 >40 mg/dL   Total CHOL/HDL Ratio 2.4 RATIO   VLDL 19 0 - 40 mg/dL   LDL Cholesterol 72 0 - 99 mg/dL    Comment:        Total Cholesterol/HDL:CHD Risk Coronary Heart Disease Risk Table                     Men   Women  1/2 Average Risk   3.4   3.3  Average Risk       5.0   4.4  2 X Average Risk   9.6   7.1  3 X Average Risk  23.4   11.0        Use the calculated Patient Ratio above and the CHD Risk Table to determine the patient's CHD Risk.        ATP III CLASSIFICATION (LDL):  <100     mg/dL   Optimal  100-129  mg/dL   Near or Above                    Optimal  130-159  mg/dL   Borderline  160-189  mg/dL   High  >190     mg/dL   Very High Performed at [place] [address], [place], [place] [address]     Dg Chest 2 View  Result Date: 01/30/2019 CLINICAL DATA:  Chest pain EXAM: CHEST - 2 VIEW COMPARISON:   07/11/2012 FINDINGS: Normal heart size and mediastinal contours. Borderline hyperinflation, stable. There is no edema, consolidation, effusion, or pneumothorax. Artifact from EKG leads IMPRESSION: No evidence of active disease. Electronically Signed   By: Monte Fantasia M.D.   On: 01/30/2019 04:53   Mr Brain Wo Contrast  Result Date: 01/30/2019 CLINICAL DATA:  Right arm pain extending across the chest and into the back. Right arm numbness as well. EXAM: MRI HEAD WITHOUT CONTRAST TECHNIQUE: Multiplanar, multiecho pulse sequences of the brain and surrounding structures were obtained without intravenous contrast. COMPARISON:  Head CT 01/30/2019 FINDINGS: Brain: There  is no evidence of acute infarct, intracranial hemorrhage, mass, midline shift, or extra-axial fluid collection. Minimal T2 hyperintensity in the cerebral white matter and pons is nonspecific but compatible with chronic small vessel ischemic disease for age. The ventricles and sulci are within normal limits for age. Vascular: Major intracranial vascular flow voids are preserved. Skull and upper cervical spine: Unremarkable bone marrow signal. Anterior cervical spine fusion reported separately. Sinuses/Orbits: Unremarkable orbits. Small right mastoid effusion. Clear paranasal sinuses. Other: None. IMPRESSION: 1. No acute intracranial abnormality. 2. Minimal chronic small vessel ischemic disease. Electronically Signed   By: Logan Bores M.D.   On: 01/30/2019 12:48   Mr Cervical Spine Wo Contrast  Result Date: 01/30/2019 CLINICAL DATA:  Radiculopathy. Right arm pain radiating across the chest and back. EXAM: MRI CERVICAL SPINE WITHOUT CONTRAST TECHNIQUE: Multiplanar, multisequence MR imaging of the cervical spine was performed. No intravenous contrast was administered. COMPARISON:  01/05/2016 FINDINGS: The study is motion degraded with moderate artifact on the axial sequences. Alignment: Cervical spine straightening.  No listhesis. Vertebrae: Interval  C3-4 and C5-C7 ACDF with suspected solid osseous fusion at each level. No evidence of fracture, significant marrow edema, or suspicious marrow lesion. Cord: Suspected punctate focus of T2 hyperintensity in the left lateral cord at C5-6, likely myelomalacia related to prior spondylosis and spinal stenosis. Posterior Fossa, vertebral arteries, paraspinal tissues: Patchy T2 hyperintensity in the pons, more fully evaluated on separate brain MRI. Preserved vertebral artery flow voids. Disc levels: The cervical spinal canal is diffusely small on a congenital basis. C2-3: Mild right and moderate left facet arthrosis and shallow right paracentral disc osteophyte complex without evidence of significant stenosis. C3-4: Interval ACDF.  No evidence of significant residual stenosis. C4-5: Minimal disc bulging, minimal endplate spurring, ligamentum flavum thickening, and mild facet arthrosis result in mild spinal stenosis. No evidence of compressive neural foraminal stenosis. C5-6: Interval ACDF.  No evidence of significant residual stenosis. C6-7: Interval ACDF.  No evidence of significant residual stenosis. C7-T1: Progressive disc degeneration. Mild disc bulging, a right paracentral disc protrusion, right greater than left uncovertebral spurring, and mild right and moderate left facet arthrosis result in mild spinal stenosis and moderate right and mild left neural foraminal stenosis with potential right C8 nerve root impingement. This level is poorly evaluated on axial imaging. IMPRESSION: 1. Motion degraded examination. 2. Interval C3-4 and C5-C7 ACDF without evidence of significant residual stenosis. 3. Progressive disc degeneration at C7-T1 with mild spinal stenosis and moderate right neural foraminal stenosis. Potential right C8 nerve root impingement. 4. Mild spinal stenosis at C4-5. 5. 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 Electronically Signed   By: Logan Bores M.D.   On: 01/30/2019 12:41   Ct Head  Code Stroke Wo Contrast  Result Date: 01/30/2019 CLINICAL DATA:  Code stroke.  Right arm weakness and numbness EXAM: CT HEAD WITHOUT CONTRAST TECHNIQUE: Contiguous axial images were obtained from the base of the skull through the vertex without intravenous contrast. COMPARISON:  12/29/2015 FINDINGS: Brain: No evidence of acute infarction, hemorrhage, hydrocephalus, extra-axial collection or mass lesion/mass effect. Stable coarse calcification along the free edge of the tentorium. Vascular: No hyperdense vessel or unexpected calcification. Skull: Normal. Negative for fracture or focal lesion. Sinuses/Orbits: No acute finding. Other: These results were communicated to Dr. Cheral Marker at 5:07 amon 8/13/2020by text page via the [employer] messaging system. ASPECTS [facility] Stroke Program Early CT Score) - Ganglionic level infarction (caudate, lentiform nuclei, internal capsule, insula, M1-M3 cortex): 7 - Supraganglionic infarction (  M4-M6 cortex): 3 Total score (0-10 with 10 being normal): 10 IMPRESSION: Negative head CT.  ASPECTS is 10. Electronically Signed   By: Monte Fantasia M.D.   On: 01/30/2019 05:08   Vas US Carotid  Result Date: 01/31/2019 Carotid Arterial Duplex Study Indications:       Left arm numbness. Risk Factors:      None. Comparison Study:  No prior studies. Performing Technologist: Oliver Hum RVT  Examination Guidelines: A complete evaluation includes B-mode imaging, spectral Doppler, color Doppler, and power Doppler as needed of all accessible portions of each vessel. Bilateral testing is considered an integral part of a complete examination. Limited examinations for reoccurring indications may be performed as noted.  Right Carotid Findings: +----------+--------+--------+--------+-----------------------+--------+           PSV cm/sEDV cm/sStenosisDescribe               Comments +----------+--------+--------+--------+-----------------------+--------+ CCA Prox  101     19               smooth and heterogenous         +----------+--------+--------+--------+-----------------------+--------+ CCA Distal72      15              smooth and heterogenous         +----------+--------+--------+--------+-----------------------+--------+ ICA Prox  44      15                                              +----------+--------+--------+--------+-----------------------+--------+ ICA Distal74      24                                              +----------+--------+--------+--------+-----------------------+--------+ ECA       67      6                                               +----------+--------+--------+--------+-----------------------+--------+ +----------+--------+-------+--------+-------------------+           PSV cm/sEDV cmsDescribeArm Pressure (mmHG) +----------+--------+-------+--------+-------------------+ [id number]                                         +----------+--------+-------+--------+-------------------+ +---------+--------+--+--------+-+---------+ VertebralPSV cm/s35EDV cm/s8Antegrade +---------+--------+--+--------+-+---------+  Left Carotid Findings: +----------+--------+--------+--------+-----------------------+--------+           PSV cm/sEDV cm/sStenosisDescribe               Comments +----------+--------+--------+--------+-----------------------+--------+ CCA Prox  107     16              smooth and heterogenous         +----------+--------+--------+--------+-----------------------+--------+ CCA Distal67      21              smooth and heterogenous         +----------+--------+--------+--------+-----------------------+--------+ ICA Prox  72      14              smooth and heterogenous         +----------+--------+--------+--------+-----------------------+--------+ ICA Distal69  25                                              +----------+--------+--------+--------+-----------------------+--------+  ECA       60      6                                               +----------+--------+--------+--------+-----------------------+--------+ +----------+--------+--------+--------+-------------------+ SubclavianPSV cm/sEDV cm/sDescribeArm Pressure (mmHG) +----------+--------+--------+--------+-------------------+           155                                         +----------+--------+--------+--------+-------------------+ +---------+--------+--+--------+--+---------+ VertebralPSV cm/s53EDV cm/s19Antegrade +---------+--------+--+--------+--+---------+  Summary: Right Carotid: Velocities in the right ICA are consistent with a 1-39% stenosis. Left Carotid: Velocities in the left ICA are consistent with a 1-39% stenosis. Vertebrals: Bilateral vertebral arteries demonstrate antegrade flow. *See table(s) above for measurements and observations.     Preliminary     Review of Systems  Constitutional: Negative.   HENT: Negative.   Respiratory: Negative.   Cardiovascular: Negative.   Gastrointestinal: Negative.   Genitourinary: Negative.   Musculoskeletal:       Right arm pain from the shoulder down to the fingertips more on the ulnar aspect  Skin: Negative.   Neurological: Negative.   Psychiatric/Behavioral: Negative.    Blood pressure 114/62, pulse 67, temperature 97.8 F (36.6 C), temperature source Oral, resp. rate 16, height 5\' 4"  (1.626 m), weight 117.9 kg, SpO2 98 %. Physical Exam  Constitutional: She is oriented to person, place, and time. She appears well-developed and well-nourished.  HENT:  Head: Normocephalic and atraumatic.  Eyes: Pupils are equal, round, and reactive to light. EOM are normal.  Neck: Normal range of motion. Neck supple.  Musculoskeletal:     Comments: Positive Spurling's test on the right side.  Motor function reveals weak interossei on the right hand with difficulty extending the fingers fully.  He is to be graded 3 out of 5 grip strength is  diminished to 4- out of 5.  The first dorsal interosseous appears to have strength be graded at 3 out of 5.  Left-handed strength is normal.  Sensation is diminished on the ulnar aspect of the hand fourth and fifth digits.  There is an absent triceps reflex 1+ biceps reflexes bilaterally tricep reflex on the left is intact  Neurological: She is alert and oriented to person, place, and time.  Skin: Skin is warm and dry.  Psychiatric: She has a normal mood and affect. Her behavior is normal. Judgment and thought content normal.    Assessment/Plan: Herniated nucleus pulposus C7-T1 on the right with right C8 radiculopathy  Plan: IV steroids this evening followed by oral prednisone Dosepak for 12 days.  I believe that this should help to calm his symptoms down and hopefully some of her strength will return.  I will plan on seeing the patient in the office in about 2 weeks time.  Should there be worsening deterioration of function or return of worsening pain my office may be contacted so that the patient can see 1 of my partners in the interim.  [name]  [name] 01/31/2019, 5:44 PM

## 2019-01-31 NOTE — Progress Notes (Signed)
Carotid artery duplex has been completed. Preliminary results can be found in CV Proc through chart review.   01/31/19 1:17 PM [name] RVT

## 2019-01-31 NOTE — TOC Initial Note (Signed)
Transition of Care [facility]) - Initial/Assessment Note    Patient Details  Name: [name] MRN: [id number] Date of Birth: [birth year]  [facility] CM/SW Contact:    Pollie Friar, RN Phone Number: 01/31/2019, 3:33 PM  Clinical Narrative:                 Pt prefers outpatient therapy over [facility]. CM met with her and she would like to attend [employer]. Orders in Epic and information on the AVS. Pt has transportation home when medically ready.   Expected Discharge Plan: OP Rehab Barriers to Discharge: Continued Medical Work up   Patient Goals and CMS Choice        Expected Discharge Plan and Services Expected Discharge Plan: OP Rehab   Discharge Planning Services: CM Consult                                          Prior Living Arrangements/Services   Lives with:: Spouse Patient language and need for interpreter reviewed:: Yes(no needs) Do you feel safe going back to the place where you live?: Yes            Criminal Activity/Legal Involvement Pertinent to Current Situation/Hospitalization: No - Comment as needed  Activities of Daily Living Home Assistive Devices/Equipment: None ADL Screening (condition at time of admission) Patient's cognitive ability adequate to safely complete daily activities?: Yes Is the patient deaf or have difficulty hearing?: No Does the patient have difficulty seeing, even when wearing glasses/contacts?: No Does the patient have difficulty concentrating, remembering, or making decisions?: No Patient able to express need for assistance with ADLs?: Yes Does the patient have difficulty dressing or bathing?: No Independently performs ADLs?: Yes (appropriate for developmental age) Does the patient have difficulty walking or climbing stairs?: No Weakness of Legs: None Weakness of Arms/Hands: Right  Permission Sought/Granted                  Emotional Assessment Appearance:: Appears stated  age Attitude/Demeanor/Rapport: Engaged   Orientation: : Oriented to Self, Oriented to Place, Oriented to  Time, Oriented to Situation   Psych Involvement: No (comment)  Admission diagnosis:  Right arm numbness [R20.0] Right facial numbness [R20.0] Essential hypertension [I10] Patient Active Problem List   Diagnosis Date Noted  . Right arm numbness 01/30/2019  . Chronic migraine 12/06/2017  . Chronic intractable headache 10/23/2017  . 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 [facility] [address] [place] [address] Phone: [phone] Fax: [phone]     Social Determinants of Health (SDOH) Interventions    Readmission Risk Interventions No flowsheet data found.

## 2019-02-01 DIAGNOSIS — N183 Chronic kidney disease, stage 3 (moderate): Secondary | ICD-10-CM

## 2019-02-01 DIAGNOSIS — R7303 Prediabetes: Secondary | ICD-10-CM

## 2019-02-01 LAB — CBC WITH DIFFERENTIAL/PLATELET
Abs Immature Granulocytes: 0.05 10*3/uL (ref 0.00–0.07)
Basophils Absolute: 0 10*3/uL (ref 0.0–0.1)
Basophils Relative: 0 %
Eosinophils Absolute: 0 10*3/uL (ref 0.0–0.5)
Eosinophils Relative: 0 %
HCT: 35.2 % — ABNORMAL LOW (ref 36.0–46.0)
Hemoglobin: 11.2 g/dL — ABNORMAL LOW (ref 12.0–15.0)
Immature Granulocytes: 1 %
Lymphocytes Relative: 15 %
Lymphs Abs: 1 10*3/uL (ref 0.7–4.0)
MCH: 29.1 pg (ref 26.0–34.0)
MCHC: 31.8 g/dL (ref 30.0–36.0)
MCV: 91.4 fL (ref 80.0–100.0)
Monocytes Absolute: 0.1 10*3/uL (ref 0.1–1.0)
Monocytes Relative: 1 %
Neutro Abs: 5.8 10*3/uL (ref 1.7–7.7)
Neutrophils Relative %: 83 %
Platelets: 267 10*3/uL (ref 150–400)
RBC: 3.85 MIL/uL — ABNORMAL LOW (ref 3.87–5.11)
RDW: 14.6 % (ref 11.5–15.5)
WBC: 7 10*3/uL (ref 4.0–10.5)
nRBC: 0 % (ref 0.0–0.2)

## 2019-02-01 LAB — COMPREHENSIVE METABOLIC PANEL
ALT: 15 U/L (ref 0–44)
AST: 15 U/L (ref 15–41)
Albumin: 3.5 g/dL (ref 3.5–5.0)
Alkaline Phosphatase: 79 U/L (ref 38–126)
Anion gap: 7 (ref 5–15)
BUN: 17 mg/dL (ref 8–23)
CO2: 24 mmol/L (ref 22–32)
Calcium: 10 mg/dL (ref 8.9–10.3)
Chloride: 109 mmol/L (ref 98–111)
Creatinine, Ser: 1.28 mg/dL — ABNORMAL HIGH (ref 0.44–1.00)
GFR calc Af Amer: 49 mL/min — ABNORMAL LOW (ref >60)
GFR calc non Af Amer: 42 mL/min — ABNORMAL LOW (ref >60)
Glucose, Bld: 182 mg/dL — ABNORMAL HIGH (ref 70–99)
Potassium: 4.4 mmol/L (ref 3.5–5.1)
Sodium: 140 mmol/L (ref 135–145)
Total Bilirubin: 0.3 mg/dL (ref 0.3–1.2)
Total Protein: 6.6 g/dL (ref 6.5–8.1)

## 2019-02-01 LAB — MAGNESIUM: Magnesium: 2.2 mg/dL (ref 1.7–2.4)

## 2019-02-01 LAB — PHOSPHORUS: Phosphorus: 2.2 mg/dL — ABNORMAL LOW (ref 2.5–4.6)

## 2019-02-01 MED ORDER — PREDNISONE 10 MG (21) PO TBPK: ORAL_TABLET | ORAL | 0 refills | Status: DC

## 2019-02-01 MED ORDER — K PHOS MONO-SOD PHOS DI & MONO 155-852-130 MG PO TABS
500.0000 mg | ORAL_TABLET | Freq: Two times a day (BID) | ORAL | Status: DC
  Administered 2019-02-01: 500 mg via ORAL
  Filled 2019-02-01: qty 2

## 2019-02-01 NOTE — Discharge Summary (Signed)
Physician Discharge Summary  [name] OTL:[id number] DOB: 1945-12-31 DOA: 01/30/2019  PCP: Leonard Downing, MD  Admit date: 01/30/2019 Discharge date: 02/01/2019  Admitted From: Home Disposition: Home with Outpatient OT  Recommendations for Outpatient Follow-up:  1. Follow up with PCP in 1-2 weeks 2. Follow up with Neurosurgery Dr. Ellene Route in 2 weeks after Steroid taper 3. Follow up with Nephrology for outpatient evaluation of CKD 4. Please obtain CMP/CBC, Mag, Phos in one week 5. Please follow up on the following pending results:  Home Health: No Equipment/Devices: None    Discharge Condition: Stable  CODE STATUS: FULL CODE Diet recommendation: Heart Healthy Carb Modified Diet   Brief/Interim Summary: HPI per Dr. Alben Deeds on 01/30/2019 [name] a 73 y.o.femalewith medical history significant ofanxiety, GERD, migraines, OSA, obesity, C spine surgery presenting with sudden pain and numbness in her RUE.  She notes she went to bed around midnihgt last night and developed pain in her R arm. This progressed down into her hand, spearding from her shoulder. She noticed her arm and hand were numb around 2 oclock. She also noticed pain in her R chest and upper back. She denies any sweating, fevers, chills, shortness of breath. Denies any facial droop, numbness, tingling, weakness in other places. She thinks her R arm may be a little weak. She took 7 baby aspirin and came to hospital.   **Interim History Continues to have numbness and decreased sensation in her right arm and fingers.  Neurosurgery Dr. Ellene Route to evaluate and recommends given IV steroids and started on IV Decadron 10 mg once and then will continue at 4 mg twice daily.  Dr. Ellene Route evaluated and recommended a steroid taper.  Patient was started on IV fluids given her renal function improved slightly.  Her pain and weakness did not get worse so she was deemed stable for discharge she will  need to follow-up with PCP, nephrology as well as neurosurgeon outpatient.  Dr. Ellene Route will see the patient in 2 weeks after steroid taper.  She is stable for discharge and understands she needs to follow-up with PCP and will no longer take indomethacin and will have a referral for nephrology in outpatient setting.  Discharge Diagnoses:  Active Problems:   Right arm numbness   Cervical radiculopathy at C8  Right Arm Numbness  Weakness consistent with C8 radiculopathy -Seen by tele neurology who recommend admission to hospital for stroke work up. Ddx also includes cervical myelopathy.  -She also had R sided CP, but EKG reassuring and negative troponins. -Negative head CT and MRI of the brain without contrast showed no acute intracranial abnormalities and minimal chronic small vessel ischemic disease -MRI cervical spine showed a motion degraded examination with interval C3-4 and C5-C7 ACDF without evidence of significant residual stenosis.  There is also progressive disc degeneration at C7 and T1 with mild spinal stenosis and moderate right neural foraminal stenosis and potential right C8 nerve root impingement.  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 Kristeen Miss who recommends starting the patient on IV steroids currently in recommending IV Decadron 10 mg once now and then 4 mg twice daily -Ordered echocardiogram and carotid Dopplers  -Cardiogram showed hyperdynamic systolic function and pseudonormalization of diastolic function and carotid arteries showed 1-39 stenosis bilaterally -We will change her to inpatient status as she will be crossing her second midnight tonight and because as Dr. Ellene Route recommends  IV steroids today to see if her radiculopathy improves for safe discharge home hopefully tomorrow otherwise she will need surgical intervention given that her hand is still weak -Continue with aspirin 81 mg  p.o. daily; if she worsens, or has any further deterioration of function or has worsening pain we will have neurosurgery evaluate further. -We will also start IV fluid hydration as below suspected AKI as well; patient states that she still is very weak in her right hand but not worse than yesterday so we will discharge home on a steroid taper and have her follow-up within 2 weeks with Dr. Kristeen Miss -Continue to monitor and reevaluate and hopefully discharge home in a.m. on a steroid taper if radiculopathy is improved somewhat but for now Dr. Loanne Drilling recommends IV steroids -PT and OT evaluated and OT recommending outpatient OT orders will be placed for [facility] neuro rehab  ?AKI but suspected CKD Stage 3 -Patient's BUN/Cr on Admission was 18/1.35 and yesterday AM was 17/1.41; currently now after IV fluid radiation BSS creatinine is 17/1.28 -Started gentle IV fluid hydration with normal saline at 75 mL's per hour and now stopped -Avoid Nephrotoxic Medications, Contrast Dyes, and Hypotension if possible -We will hold the patient's Lasix and indomethacin currently and resume Lasix at discharge but will hold indomethacin and discontinue completely -Continue to Monitor and Trend Renal Fxn -Repeat within 1 week -We will need referral to nephrology for further evaluation management for suspected CKD  Normocytic Anemia/Anemia of Chronic Kidney Disease -Patient's Hb/Hct went from 11.4/36.5 on Admission and is now 11.2/35.2 -Check Anemia Panel as an outpatient -Continue with ferrous sulfate 325 mg p.o. daily with breakfast -Continue to Monitor for S/Sx of Bleeding; Currently no overt bleeding noted -Repeat CBC within 1 week  Morbid Obesity -Estimated body mass index is 44.39 kg/m as calculated from the following:   Height as of this encounter: 5\' 4"  (1.626 m).   Weight as of this encounter: 117.3 kg. -Weight Loss and Dietary Counseling given   Obstructive Sleep Apnea -C/w CPAP  qHS  HLD -Lipid Panel done and showed cholesterol/HDL ratio 2.4, cholesterol level 158, HDL level of 67, LDL 72, triglycerides 95, VLDL 19 -C/w Simvastatin 20 mg p.o. daily  GERD/Barretts Esophagus  -C/w PPI with Pantoprazole 40 mg p.o. daily  Hypokalemia -Improved after repletion and potassium is now 4.4 -Continue to monitor and replete as necessary -Repeat CMP as an outpatient  HTN  Lower Extremity Swelling -hold lasix for now and started gentle IV fluid hydration as above and now that IV fluids are stopped given mild swelling -Blood pressure was 142/70 and on admission it was 204/110  Migraines -Continue Nortriptyline milligrams p.o. nightly along with Tizanidine 2 mg p.o. 3 times daily -hold indomethacin for now given AKI versus suspected CKDand will discontinue at discharge  ADD -Not taking ritalin anymore  Depression -Continue with citalopram 40 mg p.o. daily and Nortriptyline 20 g p.o. nightly  PreDiabetes -Hemoglobin A1c was 6.1 -We will need to continue monitor blood sugars extremely carefully now as she is being started on IV steroids and likely will have elevated blood sugar secondary to steroids -Continue monitor and trend and may need to place the patient on sensitive NovoLog/scale insulin before meals and at bedtime hospitalized next-we will need PCP to follow very closely and defer to them for further management  Hypophosphatemia -Patient's phosphorus level was 2.2 -Replete with p.o. K-Phos Neutral 500 mg p.o. twice daily x2 doses -Continue monitor replete as necessary -Repeat phosphorus level in  the a.m.  Discharge Instructions  Discharge Instructions    Ambulatory referral to Occupational Therapy   Complete by: As directed    Call MD for:  difficulty breathing, headache or visual disturbances   Complete by: As directed    Call MD for:  extreme fatigue   Complete by: As directed    Call MD for:  hives   Complete by: As directed    Call MD  for:  persistant dizziness or light-headedness   Complete by: As directed    Call MD for:  persistant nausea and vomiting   Complete by: As directed    Call MD for:  redness, tenderness, or signs of infection (pain, swelling, redness, odor or green/yellow discharge around incision site)   Complete by: As directed    Call MD for:  severe uncontrolled pain   Complete by: As directed    Call MD for:  temperature >100.4   Complete by: As directed    Diet - low sodium heart healthy   Complete by: As directed    Discharge instructions   Complete by: As directed    You were cared for by a hospitalist during your hospital stay. If you have any questions about your discharge medications or the care you received while you were in the hospital after you are discharged, you can call the unit and ask to speak with the hospitalist on call if the hospitalist that took care of you is not available. Once you are discharged, your primary care physician will handle any further medical issues. Please note that NO REFILLS for any discharge medications will be authorized once you are discharged, as it is imperative that you return to your primary care physician (or establish a relationship with a primary care physician if you do not have one) for your aftercare needs so that they can reassess your need for medications and monitor your lab values.  Follow up with PCP, Neurosurgery, and Nephrology. Take all medications as prescribed. If symptoms change or worsen please return to the ED for evaluation   Increase activity slowly   Complete by: As directed      Allergies as of 02/01/2019      Reactions   Nickel Hives, Rash      Medication List    STOP taking these medications   indomethacin 50 MG capsule Commonly known as: INDOCIN     TAKE these medications   citalopram 40 MG tablet Commonly known as: CELEXA Take 40 mg by mouth daily. Notes to patient: 02/02/19   diphenhydrAMINE 25 mg capsule Commonly  known as: BENADRYL Take 25 mg by mouth at bedtime. Notes to patient: 02/01/19   ferrous sulfate 325 (65 FE) MG tablet Take 325 mg by mouth daily with breakfast. Notes to patient: 02/02/19   fexofenadine 180 MG tablet Commonly known as: ALLEGRA Take 180 mg by mouth daily. Notes to patient: Continue home schedule   fluticasone 50 MCG/ACT nasal spray Commonly known as: FLONASE Place 1 spray into the nose 2 (two) times daily. Notes to patient: Continue home schedule   furosemide 80 MG tablet Commonly known as: LASIX Take 80 mg by mouth daily. Notes to patient: 02/01/19   methylphenidate 10 MG tablet Commonly known as: RITALIN Take 10 mg by mouth daily as needed (focus).   nortriptyline 10 MG capsule Commonly known as: PAMELOR TAKE 2 CAPSULES BY MOUTH AT BEDTIME Notes to patient: 02/01/19   pantoprazole 40 MG tablet Commonly known as: PROTONIX Take 40  mg by mouth daily. Notes to patient: 02/02/19   predniSONE 10 MG (21) Tbpk tablet Commonly known as: STERAPRED UNI-PAK 21 TAB Take 6 tablets on day 1 and then on day 2, 5 tablets on day 3 and day 4, 4 tablets on day 5 and day 6, 3 tablets on day 7 and 8, 2 tablets on day 9 and 10, 1 tablet on day 11 and 12 and then stop after Notes to patient: 02/01/19   rizatriptan 10 MG tablet Commonly known as: MAXALT Take 10 mg by mouth every 2 (two) hours as needed for migraine. Max of 3 tablets in a 24 hour period   simvastatin 20 MG tablet Commonly known as: ZOCOR Take 20 mg by mouth at bedtime. Notes to patient: 02/02/19   tizanidine 2 MG capsule Commonly known as: ZANAFLEX TAKE 1 CAPSULE BY MOUTH THREE TIMES DAILY Notes to patient: 02/01/19   Vitamin D3 125 MCG (5000 UT) Caps Take 5,000 Units by mouth daily. Notes to patient: Continue home schedule      Follow-up Information    [facility] Follow up.   Specialty: Rehabilitation Why: The oupatient therapy will contact you for the  first appointment Contact information: [place] [address] [phone]       Leonard Downing, MD. Call.   Specialty: Family Medicine Why: Follow up within 1 week Contact information: [place] [address] [phone]        Kristeen Miss, MD. Call.   Specialty: Neurosurgery Why: Follow up in 2 weeks after Steroid Taper Contact information: [address] [place] [address] [phone]        [place], [place]. Call.   Why: Follow up after PCP has referred for CKD evaluation  Contact information: [address]  [place] [address] [phone]          Allergies  Allergen Reactions  . Nickel Hives and Rash   Consultations: Neurosurgery Dr. Ellene Route   Procedures/Studies: Dg Chest 2 View  Result Date: 01/30/2019 CLINICAL DATA:  Chest pain EXAM: CHEST - 2 VIEW COMPARISON:  07/11/2012 FINDINGS: Normal heart size and mediastinal contours. Borderline hyperinflation, stable. There is no edema, consolidation, effusion, or pneumothorax. Artifact from EKG leads IMPRESSION: No evidence of active disease. Electronically Signed   By: Monte Fantasia M.D.   On: 01/30/2019 04:53   Mr Brain Wo Contrast  Result Date: 01/30/2019 CLINICAL DATA:  Right arm pain extending across the chest and into the back. Right arm numbness as well. EXAM: MRI HEAD WITHOUT CONTRAST TECHNIQUE: Multiplanar, multiecho pulse sequences of the brain and surrounding structures were obtained without intravenous contrast. COMPARISON:  Head CT 01/30/2019 FINDINGS: Brain: There is no evidence of acute infarct, intracranial hemorrhage, mass, midline shift, or extra-axial fluid collection. Minimal T2 hyperintensity in the cerebral white matter and pons is nonspecific but compatible with chronic small vessel ischemic disease for age. The ventricles and sulci are within normal limits for age. Vascular: Major intracranial  vascular flow voids are preserved. Skull and upper cervical spine: Unremarkable bone marrow signal. Anterior cervical spine fusion reported separately. Sinuses/Orbits: Unremarkable orbits. Small right mastoid effusion. Clear paranasal sinuses. Other: None. IMPRESSION: 1. No acute intracranial abnormality. 2. Minimal chronic small vessel ischemic disease. Electronically Signed   By: Logan Bores M.D.   On: 01/30/2019 12:48   Mr Cervical Spine Wo Contrast  Result Date: 01/30/2019 CLINICAL DATA:  Radiculopathy. Right arm pain radiating across the  chest and back. EXAM: MRI CERVICAL SPINE WITHOUT CONTRAST TECHNIQUE: Multiplanar, multisequence MR imaging of the cervical spine was performed. No intravenous contrast was administered. COMPARISON:  01/05/2016 FINDINGS: The study is motion degraded with moderate artifact on the axial sequences. Alignment: Cervical spine straightening.  No listhesis. Vertebrae: Interval C3-4 and C5-C7 ACDF with suspected solid osseous fusion at each level. No evidence of fracture, significant marrow edema, or suspicious marrow lesion. Cord: Suspected punctate focus of T2 hyperintensity in the left lateral cord at C5-6, likely myelomalacia related to prior spondylosis and spinal stenosis. Posterior Fossa, vertebral arteries, paraspinal tissues: Patchy T2 hyperintensity in the pons, more fully evaluated on separate brain MRI. Preserved vertebral artery flow voids. Disc levels: The cervical spinal canal is diffusely small on a congenital basis. C2-3: Mild right and moderate left facet arthrosis and shallow right paracentral disc osteophyte complex without evidence of significant stenosis. C3-4: Interval ACDF.  No evidence of significant residual stenosis. C4-5: Minimal disc bulging, minimal endplate spurring, ligamentum flavum thickening, and mild facet arthrosis result in mild spinal stenosis. No evidence of compressive neural foraminal stenosis. C5-6: Interval ACDF.  No evidence of  significant residual stenosis. C6-7: Interval ACDF.  No evidence of significant residual stenosis. C7-T1: Progressive disc degeneration. Mild disc bulging, a right paracentral disc protrusion, right greater than left uncovertebral spurring, and mild right and moderate left facet arthrosis result in mild spinal stenosis and moderate right and mild left neural foraminal stenosis with potential right C8 nerve root impingement. This level is poorly evaluated on axial imaging. IMPRESSION: 1. Motion degraded examination. 2. Interval C3-4 and C5-C7 ACDF without evidence of significant residual stenosis. 3. Progressive disc degeneration at C7-T1 with mild spinal stenosis and moderate right neural foraminal stenosis. Potential right C8 nerve root impingement. 4. Mild spinal stenosis at C4-5. 5. 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 Electronically Signed   By: Logan Bores M.D.   On: 01/30/2019 12:41   Ct Head Code Stroke Wo Contrast  Result Date: 01/30/2019 CLINICAL DATA:  Code stroke.  Right arm weakness and numbness EXAM: CT HEAD WITHOUT CONTRAST TECHNIQUE: Contiguous axial images were obtained from the base of the skull through the vertex without intravenous contrast. COMPARISON:  12/29/2015 FINDINGS: Brain: No evidence of acute infarction, hemorrhage, hydrocephalus, extra-axial collection or mass lesion/mass effect. Stable coarse calcification along the free edge of the tentorium. Vascular: No hyperdense vessel or unexpected calcification. Skull: Normal. Negative for fracture or focal lesion. Sinuses/Orbits: No acute finding. Other: These results were communicated to Dr. Cheral Marker at 5:07 amon 8/13/2020by text page via the [facility] messaging system. ASPECTS [facility] Stroke Program Early CT Score) - Ganglionic level infarction (caudate, lentiform nuclei, internal capsule, insula, M1-M3 cortex): 7 - Supraganglionic infarction (M4-M6 cortex): 3 Total score (0-10 with 10 being normal): 10 IMPRESSION:  Negative head CT.  ASPECTS is 10. Electronically Signed   By: Monte Fantasia M.D.   On: 01/30/2019 05:08   Vas US Carotid  Result Date: 02/01/2019 Carotid Arterial Duplex Study Indications:       Left arm numbness. Risk Factors:      None. Comparison Study:  No prior studies. Performing Technologist: Oliver Hum RVT  Examination Guidelines: A complete evaluation includes B-mode imaging, spectral Doppler, color Doppler, and power Doppler as needed of all accessible portions of each vessel. Bilateral testing is considered an integral part of a complete examination. Limited examinations for reoccurring indications may be performed as noted.  Right Carotid Findings: +----------+--------+--------+--------+-----------------------+--------+  PSV cm/sEDV cm/sStenosisDescribe               Comments +----------+--------+--------+--------+-----------------------+--------+ CCA Prox  101     19              smooth and heterogenous         +----------+--------+--------+--------+-----------------------+--------+ CCA Distal72      15              smooth and heterogenous         +----------+--------+--------+--------+-----------------------+--------+ ICA Prox  44      15                                              +----------+--------+--------+--------+-----------------------+--------+ ICA Distal74      24                                              +----------+--------+--------+--------+-----------------------+--------+ ECA       67      6                                               +----------+--------+--------+--------+-----------------------+--------+ +----------+--------+-------+--------+-------------------+           PSV cm/sEDV cmsDescribeArm Pressure (mmHG) +----------+--------+-------+--------+-------------------+ [id number]                                         +----------+--------+-------+--------+-------------------+  +---------+--------+--+--------+-+---------+ VertebralPSV cm/s35EDV cm/s8Antegrade +---------+--------+--+--------+-+---------+  Left Carotid Findings: +----------+--------+--------+--------+-----------------------+--------+           PSV cm/sEDV cm/sStenosisDescribe               Comments +----------+--------+--------+--------+-----------------------+--------+ CCA Prox  107     16              smooth and heterogenous         +----------+--------+--------+--------+-----------------------+--------+ CCA Distal67      21              smooth and heterogenous         +----------+--------+--------+--------+-----------------------+--------+ ICA Prox  72      14              smooth and heterogenous         +----------+--------+--------+--------+-----------------------+--------+ ICA Distal69      25                                              +----------+--------+--------+--------+-----------------------+--------+ ECA       60      6                                               +----------+--------+--------+--------+-----------------------+--------+ +----------+--------+--------+--------+-------------------+ SubclavianPSV cm/sEDV cm/sDescribeArm Pressure (mmHG) +----------+--------+--------+--------+-------------------+           155                                         +----------+--------+--------+--------+-------------------+ +---------+--------+--+--------+--+---------+  VertebralPSV cm/s53EDV cm/s19Antegrade +---------+--------+--+--------+--+---------+  Summary: Right Carotid: Velocities in the right ICA are consistent with a 1-39% stenosis. Left Carotid: Velocities in the left ICA are consistent with a 1-39% stenosis. Vertebrals: Bilateral vertebral arteries demonstrate antegrade flow. *See table(s) above for measurements and observations.  Electronically signed by Antony Contras MD on 02/01/2019 at 12:47:28 PM.    Final      Subjective: Seen and  examined at bedside states that she is a little bit puffy since she got the fluids but her weakness and numbness but no worse.  No chest pain, lightheadedness or dizziness.  No nausea or vomiting.  No other concerns or complaints at this time.  Understands that she will need to follow-up with PCP, nephrology as well as neurosurgery in outpatient setting.  Discharge Exam: Vitals:   02/01/19 0752 02/01/19 1154  BP: (!) 134/91 (!) 142/70  Pulse: 99 80  Resp: 16 16  Temp: 98.2 F (36.8 C) 98.2 F (36.8 C)  SpO2: 93% 97%   Vitals:   02/01/19 0347 02/01/19 0400 02/01/19 0752 02/01/19 1154  BP: 120/76  (!) 134/91 (!) 142/70  Pulse: 77 80 99 80  Resp: 18 16 16 16   Temp: 98.2 F (36.8 C)  98.2 F (36.8 C) 98.2 F (36.8 C)  TempSrc: Oral  Oral   SpO2: 98% 99% 93% 97%  Weight: 117.3 kg     Height:       General: Pt is alert, awake, not in acute distress Cardiovascular: RRR, S1/S2 +, no rubs, no gallops Respiratory: Diminished bilaterally, no wheezing, no rhonchi Abdominal: Soft, NT, distended secondary body habitus, bowel sounds + Extremities: Has 1+ lower extremity and mild upper extremity edema, no cyanosis  The results of significant diagnostics from this hospitalization (including imaging, microbiology, ancillary and laboratory) are listed below for reference.    Microbiology: Recent Results (from the past 240 hour(s))  SARS CORONAVIRUS 2 Nasal Swab Urine, Clean Catch     Status: None   Collection Time: 01/30/19  5:53 AM   Specimen: Urine, Clean Catch; Nasal Swab  Result Value Ref Range Status   SARS Coronavirus 2 NEGATIVE NEGATIVE Final    Comment: (NOTE) SARS-CoV-2 target nucleic acids are NOT DETECTED. The SARS-CoV-2 RNA is generally detectable in upper and lower respiratory specimens during the acute phase of infection. Negative results do not preclude SARS-CoV-2 infection, do not rule out co-infections with other pathogens, and should not be used as the sole basis for  treatment or other patient management decisions. Negative results must be combined with clinical observations, patient history, and epidemiological information. The expected result is Negative. Fact Sheet for Patients: SugarRoll.be Fact Sheet for Healthcare Providers: https://www.woods-mathews.com/ This test is not yet approved or cleared by the [place] FDA and  has been authorized for detection and/or diagnosis of SARS-CoV-2 by FDA under an Emergency Use Authorization (EUA). This EUA will remain  in effect (meaning this test can be used) for the duration of the COVID-19 declaration under Section 56 4(b)(1) of the Act, 21 U.S.C. section 360bbb-3(b)(1), unless the authorization is terminated or revoked sooner. Performed at [facility], [place] [address], [place], [place] [address]     Labs: BNP (last 3 results) No results for input(s): BNP in the last 8760 hours. Basic Metabolic Panel: Recent Labs  Lab 01/30/19 0420 01/30/19 0502 01/30/19 0609 01/30/19 1448 01/31/19 0426 02/01/19 0437  NA 139 139  --   --  139 140  K 3.0* 3.1*  --   --  4.0 4.4  CL 103 103  --   --  105 109  CO2 25  --   --   --  25 24  GLUCOSE 137* 137*  --   --  116* 182*  BUN 18 16  --   --  17 17  CREATININE 1.35* 1.30*  --  1.39* 1.41* 1.28*  CALCIUM 9.6  --   --   --  9.4 10.0  MG  --   --  2.1  --   --  2.2  PHOS  --   --   --   --   --  2.2*   Liver Function Tests: Recent Labs  Lab 01/31/19 0426 02/01/19 0437  AST 15 15  ALT 14 15  ALKPHOS 75 79  BILITOT 0.8 0.3  PROT 6.1* 6.6  ALBUMIN 3.4* 3.5   No results for input(s): LIPASE, AMYLASE in the last 168 hours. No results for input(s): AMMONIA in the last 168 hours. CBC: Recent Labs  Lab 01/30/19 0420 01/30/19 0502 01/30/19 1448 01/31/19 0426 02/01/19 0437  WBC 7.7  --  6.1 6.4 7.0  NEUTROABS 5.3  --   --   --  5.8  HGB 11.4* 11.6* 10.9* 10.6* 11.2*  HCT 36.5 34.0* 34.6* 33.2*  35.2*  MCV 92.2  --  90.1 90.5 91.4  PLT 258  --  255 248 267   Cardiac Enzymes: No results for input(s): CKTOTAL, CKMB, CKMBINDEX, TROPONINI in the last 168 hours. BNP: Invalid input(s): POCBNP CBG: No results for input(s): GLUCAP in the last 168 hours. D-Dimer No results for input(s): DDIMER in the last 72 hours. Hgb A1c Recent Labs    01/31/19 0426  HGBA1C 6.1*   Lipid Profile Recent Labs    01/31/19 0426  CHOL 158  HDL 67  LDLCALC 72  TRIG 95  CHOLHDL 2.4   Thyroid function studies No results for input(s): TSH, T4TOTAL, T3FREE, THYROIDAB in the last 72 hours.  Invalid input(s): FREET3 Anemia work up No results for input(s): VITAMINB12, FOLATE, FERRITIN, TIBC, IRON, RETICCTPCT in the last 72 hours. Urinalysis    Component Value Date/Time   COLORURINE YELLOW 01/30/2019 0450   APPEARANCEUR CLEAR 01/30/2019 0450   LABSPEC 1.014 01/30/2019 0450   PHURINE 5.0 01/30/2019 0450   GLUCOSEU NEGATIVE 01/30/2019 0450   HGBUR NEGATIVE 01/30/2019 0450   BILIRUBINUR NEGATIVE 01/30/2019 0450   KETONESUR NEGATIVE 01/30/2019 0450   PROTEINUR NEGATIVE 01/30/2019 0450   UROBILINOGEN 1.0 07/11/2012 1023   NITRITE NEGATIVE 01/30/2019 0450   LEUKOCYTESUR NEGATIVE 01/30/2019 0450   Sepsis Labs Invalid input(s): PROCALCITONIN,  WBC,  LACTICIDVEN Microbiology Recent Results (from the past 240 hour(s))  SARS CORONAVIRUS 2 Nasal Swab Urine, Clean Catch     Status: None   Collection Time: 01/30/19  5:53 AM   Specimen: Urine, Clean Catch; Nasal Swab  Result Value Ref Range Status   SARS Coronavirus 2 NEGATIVE NEGATIVE Final    Comment: (NOTE) SARS-CoV-2 target nucleic acids are NOT DETECTED. The SARS-CoV-2 RNA is generally detectable in upper and lower respiratory specimens during the acute phase of infection. Negative results do not preclude SARS-CoV-2 infection, do not rule out co-infections with other pathogens, and should not be used as the sole basis for treatment or other  patient management decisions. Negative results must be combined with clinical observations, patient history, and epidemiological information. The expected result is Negative. Fact Sheet for Patients: SugarRoll.be Fact Sheet for Healthcare Providers: https://www.woods-mathews.com/ This test is not yet  approved or cleared by the [place] and  has been authorized for detection and/or diagnosis of SARS-CoV-2 by FDA under an Emergency Use Authorization (EUA). This EUA will remain  in effect (meaning this test can be used) for the duration of the COVID-19 declaration under Section 56 4(b)(1) of the Act, 21 U.S.C. section 360bbb-3(b)(1), unless the authorization is terminated or revoked sooner. Performed at [facility], [place] [address], [place], [place] [address]    Time coordinating discharge: 35 minutes  SIGNED:  Kerney Elbe, DO [facility] 02/01/2019, 5:58 PM Pager is on [place]  If 7PM-7AM, please contact night-coverage [email or web address] Password [email or web address]

## 2019-02-04 DIAGNOSIS — D649 Anemia, unspecified: Secondary | ICD-10-CM | POA: Diagnosis not present

## 2019-02-04 DIAGNOSIS — K449 Diaphragmatic hernia without obstruction or gangrene: Secondary | ICD-10-CM | POA: Diagnosis not present

## 2019-02-04 DIAGNOSIS — N183 Chronic kidney disease, stage 3 (moderate): Secondary | ICD-10-CM | POA: Diagnosis not present

## 2019-02-04 DIAGNOSIS — R51 Headache: Secondary | ICD-10-CM | POA: Diagnosis not present

## 2019-02-13 DIAGNOSIS — M4712 Other spondylosis with myelopathy, cervical region: Secondary | ICD-10-CM | POA: Diagnosis not present

## 2019-02-13 DIAGNOSIS — M47812 Spondylosis without myelopathy or radiculopathy, cervical region: Secondary | ICD-10-CM | POA: Diagnosis not present

## 2019-02-14 ENCOUNTER — Ambulatory Visit: Payer: Medicare Other | Attending: Internal Medicine | Admitting: Occupational Therapy

## 2019-02-14 ENCOUNTER — Other Ambulatory Visit: Payer: Self-pay

## 2019-02-14 DIAGNOSIS — M6281 Muscle weakness (generalized): Secondary | ICD-10-CM

## 2019-02-14 DIAGNOSIS — R278 Other lack of coordination: Secondary | ICD-10-CM | POA: Insufficient documentation

## 2019-02-14 NOTE — Therapy (Signed)
[facility] [address] [place], [place], [address] Phone: [phone]   Fax:  [phone]  Patient Details  Name: [name] MRN: [id number] Date of Birth: [birth year] Referring Provider:  Kerney Elbe, DO  Encounter Date: 02/14/2019   Pt arrives today and reports that she is going to be scheduled for cervical surgery.  Pt already has hand exercises from hospital.  Pt decided to decline OT evaluation due to limitations with upcoming surgery.  Pt to continue with hospital issued HEP.   [facility] 02/14/2019, 10:54 AM  [facility] [address] [place], [place], [address] Phone: [phone]   Fax:  [place], OTR/L [facility] [address] [place], [place]  [address] [phone] phone [phone] 02/14/19 10:54 AM

## 2019-02-18 DIAGNOSIS — G8929 Other chronic pain: Secondary | ICD-10-CM | POA: Diagnosis not present

## 2019-02-18 DIAGNOSIS — E785 Hyperlipidemia, unspecified: Secondary | ICD-10-CM | POA: Diagnosis not present

## 2019-02-21 DIAGNOSIS — Z1159 Encounter for screening for other viral diseases: Secondary | ICD-10-CM | POA: Diagnosis not present

## 2019-02-27 DIAGNOSIS — M5023 Other cervical disc displacement, cervicothoracic region: Secondary | ICD-10-CM | POA: Diagnosis not present

## 2019-02-27 DIAGNOSIS — Z981 Arthrodesis status: Secondary | ICD-10-CM | POA: Diagnosis not present

## 2019-02-27 DIAGNOSIS — M2578 Osteophyte, vertebrae: Secondary | ICD-10-CM | POA: Diagnosis not present

## 2019-02-27 DIAGNOSIS — M4712 Other spondylosis with myelopathy, cervical region: Secondary | ICD-10-CM | POA: Diagnosis not present

## 2019-03-14 DIAGNOSIS — M4712 Other spondylosis with myelopathy, cervical region: Secondary | ICD-10-CM | POA: Diagnosis not present

## 2019-03-28 DIAGNOSIS — R519 Headache, unspecified: Secondary | ICD-10-CM | POA: Diagnosis not present

## 2019-03-28 DIAGNOSIS — Z23 Encounter for immunization: Secondary | ICD-10-CM | POA: Diagnosis not present

## 2019-03-28 DIAGNOSIS — G8929 Other chronic pain: Secondary | ICD-10-CM | POA: Diagnosis not present

## 2019-04-07 DIAGNOSIS — M316 Other giant cell arteritis: Secondary | ICD-10-CM | POA: Diagnosis not present

## 2019-04-07 DIAGNOSIS — G44201 Tension-type headache, unspecified, intractable: Secondary | ICD-10-CM | POA: Diagnosis not present

## 2019-04-18 DIAGNOSIS — M316 Other giant cell arteritis: Secondary | ICD-10-CM | POA: Diagnosis not present

## 2019-04-18 DIAGNOSIS — R519 Headache, unspecified: Secondary | ICD-10-CM | POA: Diagnosis not present

## 2019-04-24 DIAGNOSIS — M316 Other giant cell arteritis: Secondary | ICD-10-CM | POA: Diagnosis not present

## 2019-05-06 DIAGNOSIS — M316 Other giant cell arteritis: Secondary | ICD-10-CM | POA: Diagnosis not present

## 2019-05-20 ENCOUNTER — Encounter: Payer: Self-pay | Admitting: Occupational Therapy

## 2019-05-20 ENCOUNTER — Ambulatory Visit: Payer: Medicare Other | Attending: Neurological Surgery | Admitting: Occupational Therapy

## 2019-05-20 ENCOUNTER — Other Ambulatory Visit: Payer: Self-pay

## 2019-05-20 DIAGNOSIS — R2681 Unsteadiness on feet: Secondary | ICD-10-CM | POA: Diagnosis not present

## 2019-05-20 DIAGNOSIS — M6281 Muscle weakness (generalized): Secondary | ICD-10-CM | POA: Diagnosis not present

## 2019-05-20 DIAGNOSIS — R278 Other lack of coordination: Secondary | ICD-10-CM

## 2019-05-20 DIAGNOSIS — R209 Unspecified disturbances of skin sensation: Secondary | ICD-10-CM | POA: Diagnosis not present

## 2019-05-20 DIAGNOSIS — R208 Other disturbances of skin sensation: Secondary | ICD-10-CM

## 2019-05-20 NOTE — Therapy (Signed)
[facility] [address] [place], [place], [address] Phone: [phone]   Fax:  [phone]  Occupational Therapy Evaluation  Patient Details  Name: [name] MRN: [id number] Date of Birth: Jan 24, 1946 Referring Provider (OT): Elsner   Encounter Date: 05/20/2019  OT End of Session - 05/20/19 1901    Visit Number  1    Number of Visits  3    Date for OT Re-Evaluation  06/19/19    Authorization Type  mcr    Authorization - Visit Number  1    Authorization - Number of Visits  10    OT Start Time  [id number]    OT Stop Time  1615    OT Time Calculation (min)  45 min       Past Medical History:  Diagnosis Date  . ADD (attention deficit disorder)   . Anxiety   . Arthritis    os  . GERD (gastroesophageal reflux disease)    barretts  . Headache   . High cholesterol   . Neck pain   . Obesity   . Personal history of radiation therapy 2005  . Sleep apnea    cpap   last sleep study> 3 yrs  . Swelling of both ankles     Past Surgical History:  Procedure Laterality Date  . ABDOMINAL HYSTERECTOMY    . ANTERIOR CERVICAL DECOMP/DISCECTOMY FUSION N/A 02/15/2016   Procedure: Cervical three four-Cervical five-six, Cervical six-seven Anterior cervical decompression/diskectomy/fusion;  Surgeon: Kristeen Miss, MD;  Location: [facility];  Service: Neurosurgery;  Laterality: N/A;  . bicept surgery    . BREAST LUMPECTOMY    . CHOLECYSTECTOMY    . JOINT REPLACEMENT    . PAROTID GLAND TUMOR EXCISION    . TONSILLECTOMY    . TOTAL KNEE ARTHROPLASTY  07/19/2012   Procedure: TOTAL KNEE ARTHROPLASTY;  Surgeon: Yvette Rack., MD;  Location: [facility];  Service: Orthopedics;  Laterality: Right;  right total knee arthroplasty  . TUBAL LIGATION      There were no vitals filed for this visit.  Subjective Assessment - 05/20/19 1537    Subjective   I would like to be able to use my arm a little better - right arm    Pertinent History   ACDF    Currently in Pain?  Yes    Pain Score  2     Pain Location  Head    Pain Orientation  Anterior    Pain Descriptors / Indicators  Aching    Pain Type  Acute pain    Pain Onset  In the past 7 days    Pain Frequency  Constant    Pain Relieving Factors  PAIN MEDICATION    Effect of Pain on Daily Activities  PAIN MEDS MAKE HER UNSTEADY        [facility] OT Assessment - 05/20/19 0001      Assessment   Medical Diagnosis  Spondylosis     Referring Provider (OT)  Elsner    Onset Date/Surgical Date  02/27/19    Hand Dominance  Right      Precautions   Precautions  Fall      Prior Function   Level of Independence  Independent with basic ADLs;Independent with homemaking with ambulation    Vocation  Retired    Leisure  Read, pet care      ADL   Eating/Feeding  Minimal assistance    Grooming  Modified independent  Upper Body Bathing  Other (comment)   extra time   Lower Body Bathing  Increased time    Upper Body Dressing  Increased time    Lower Body Dressing  Increased time    Toilet Transfer  Modified independent    Toileting - Clothing Manipulation  Modified independent    Toileting -  Hygiene  Modified Independent    Tub/Shower Transfer  Modified independent    Physicist, medical seat with back    Equipment Used  Rolling walker      IADL   Prior Level of Function Shopping  independent    Shopping  Completely unable to shop    Prior Level of Function Light Housekeeping  independent    Light Housekeeping  Does not participate in any housekeeping tasks    Prior Level of Function Meal Prep  independent    Meal Prep  Able to complete simple warm meal prep    Prior Level of Function Community Mobility  stopped driving in Aug - cataracts    Community Mobility  Relies on family or friends for transportation    Prior Level of Function Medication Managment  independent    Medication Management  Is responsible for taking medication in correct dosages at  correct time    Prior Level of Function Chiropractor financial matters independently (budgets, writes checks, pays rent, bills goes to bank), collects and keeps track of income      Written Expression   Dominant Hand  Right    Handwriting  100% legible      Vision - History   Baseline Vision  Wears glasses all the time    Visual History  Cataracts    Additional Comments  cataracts - vision clearer w/o glasses      Posture/Postural Control   Posture/Postural Control  No significant limitations      Sensation   Light Touch  Appears Intact    Stereognosis  Appears Intact    Proprioception  Impaired by gross assessment      Coordination   Gross Motor Movements are Fluid and Coordinated  No    Fine Motor Movements are Fluid and Coordinated  No    Finger Nose Finger Test  undershoots consistently with right    9 Hole Peg Test  Right;Left    Right 9 Hole Peg Test  28.44    Left 9 Hole Peg Test  24.41      ROM / Strength   AROM / PROM / Strength  AROM;Strength      AROM   Overall AROM   Within functional limits for tasks performed   hx of shoulder surgeries - not 180 shoulder flex/abd but fxl     Strength   Overall Strength  Deficits    Strength Assessment Site  Shoulder    Right/Left Shoulder  Right    Right Shoulder Flexion  4-/5    Right Shoulder ABduction  4-/5      Hand Function   Right Hand Gross Grasp  Impaired    Right Hand Grip (lbs)  38    Right Hand Lateral Pinch  10 lbs    Left Hand Gross Grasp  Functional    Left Hand Grip (lbs)  54    Left Hand Lateral Pinch  16 lbs  OT Education - 05/20/19 1900    Education Details  Reviewed results of OT evaluation and potential plan of care - patient concerned for expense of therapy - so wants HEP    Person(s) Educated  Patient    Methods  Explanation    Comprehension  Verbalized understanding          OT Long Term Goals -  05/20/19 1907      OT LONG TERM GOAL #1   Title  Patient will complete a home exercise program designed to improve strength and coordiantion in Right hand    Time  2    Period  --   VISITS   Status  New    Target Date  06/19/19      OT LONG TERM GOAL #2   Title  Patient will complete a home exercise program designed to improve strength in proximal RUE    Time  2    Period  --   visits     OT LONG TERM GOAL #3   Title  Patient will demonstrate a 5 lb increase in grip strength in RUE to aide with opening bottles, packages, etc.    Baseline  38 lb    Time  2    Period  --   visits           Plan - 05/20/19 1901    Clinical Impression Statement  Patient is a 73 year old woman with cervical spondylosis with myelopathy, who on 9/10 had surgical ACDF C3-4, C7-T1.  Patient presents to OT evaluation with report of numbness in riight arm, and diminished coordiantion and overall weakness throughout RUE.  Patient would benefit from skilled OT intervetion to improve strength and coordiantion, but is concerned froma financial standpoint about her co-pay responsibility.  "I need you to teach me what I can do at home." OT will schedule two sessions to instruct patient on HEP as requested.    OT Occupational Profile and History  Problem Focused Assessment - Including review of records relating to presenting problem    Occupational performance deficits (Please refer to evaluation for details):  ADL's;IADL's;Rest and Sleep    Body Structure / Function / Physical Skills  ADL;Coordination;Endurance;GMC;UE functional use;Balance;Sensation;Pain;IADL;Flexibility;Decreased knowledge of use of DME;Body mechanics;Dexterity;FMC;Proprioception;Strength;ROM    Rehab Potential  Good    Clinical Decision Making  Limited treatment options, no task modification necessary    Comorbidities Affecting Occupational Performance:  May have comorbidities impacting occupational performance   prior shoulder surgeries,  bicep tendon rupture RUE, arteritis - prednisone   Modification or Assistance to Complete Evaluation   No modification of tasks or assist necessary to complete eval    OT Frequency  Other (comment)   2 visits   OT Duration  Other (comment)    OT Treatment/Interventions  Self-care/ADL training;Therapeutic exercise;Therapeutic activities    Plan  coord and hand strengthening program    Consulted and Agree with Plan of Care  Patient       Patient will benefit from skilled therapeutic intervention in order to improve the following deficits and impairments:   Body Structure / Function / Physical Skills: ADL, Coordination, Endurance, GMC, UE functional use, Balance, Sensation, Pain, IADL, Flexibility, Decreased knowledge of use of DME, Body mechanics, Dexterity, FMC, Proprioception, Strength, ROM       Visit Diagnosis: Other lack of coordination - Plan: Ot plan of care cert/re-cert  Muscle weakness (generalized) - Plan: Ot plan of care cert/re-cert  Other disturbances of skin  sensation - Plan: Ot plan of care cert/re-cert  Unsteadiness on feet - Plan: Ot plan of care cert/re-cert    Problem List Patient Active Problem List   Diagnosis Date Noted  . Cervical radiculopathy at C8 01/31/2019  . Right arm numbness 01/30/2019  . Chronic migraine 12/06/2017  . Chronic intractable headache 10/23/2017  . Cervical myelopathy with cervical radiculopathy 02/15/2016    Mariah Milling, OTR/L 05/20/2019, 7:12 PM  [facility] [address] [place], [place], [address] Phone: [phone]   Fax:  [phone]  Name: DARRIAN HOLAHAN MRN: [id number] Date of Birth: 11/26/1945

## 2019-05-21 DIAGNOSIS — M316 Other giant cell arteritis: Secondary | ICD-10-CM | POA: Diagnosis not present

## 2019-05-26 ENCOUNTER — Ambulatory Visit: Payer: Medicare Other | Admitting: Occupational Therapy

## 2019-06-02 DIAGNOSIS — R739 Hyperglycemia, unspecified: Secondary | ICD-10-CM | POA: Diagnosis not present

## 2019-06-02 DIAGNOSIS — B379 Candidiasis, unspecified: Secondary | ICD-10-CM | POA: Diagnosis not present

## 2019-06-02 DIAGNOSIS — R5383 Other fatigue: Secondary | ICD-10-CM | POA: Diagnosis not present

## 2019-06-02 DIAGNOSIS — R531 Weakness: Secondary | ICD-10-CM | POA: Diagnosis not present

## 2019-06-05 DIAGNOSIS — R260 Ataxic gait: Secondary | ICD-10-CM | POA: Diagnosis not present

## 2019-06-05 DIAGNOSIS — E119 Type 2 diabetes mellitus without complications: Secondary | ICD-10-CM | POA: Diagnosis not present

## 2019-06-12 ENCOUNTER — Ambulatory Visit: Payer: Medicare Other | Admitting: Occupational Therapy

## 2019-06-17 ENCOUNTER — Emergency Department (HOSPITAL_BASED_OUTPATIENT_CLINIC_OR_DEPARTMENT_OTHER): Payer: Medicare Other

## 2019-06-17 ENCOUNTER — Observation Stay (HOSPITAL_COMMUNITY): Payer: Medicare Other

## 2019-06-17 ENCOUNTER — Other Ambulatory Visit (HOSPITAL_COMMUNITY): Payer: Medicare Other

## 2019-06-17 ENCOUNTER — Inpatient Hospital Stay (HOSPITAL_COMMUNITY)
Admission: EM | Admit: 2019-06-17 | Discharge: 2019-06-25 | DRG: 644 | Disposition: A | Discharge status: Skilled Nursing Facility | Payer: Medicare Other | Attending: Family Medicine | Admitting: Family Medicine

## 2019-06-17 ENCOUNTER — Encounter (HOSPITAL_COMMUNITY): Payer: Self-pay | Admitting: Emergency Medicine

## 2019-06-17 ENCOUNTER — Other Ambulatory Visit: Payer: Self-pay

## 2019-06-17 ENCOUNTER — Emergency Department (HOSPITAL_COMMUNITY): Payer: Medicare Other

## 2019-06-17 DIAGNOSIS — M7989 Other specified soft tissue disorders: Secondary | ICD-10-CM | POA: Diagnosis not present

## 2019-06-17 DIAGNOSIS — E1165 Type 2 diabetes mellitus with hyperglycemia: Secondary | ICD-10-CM | POA: Diagnosis present

## 2019-06-17 DIAGNOSIS — Z87891 Personal history of nicotine dependence: Secondary | ICD-10-CM | POA: Diagnosis not present

## 2019-06-17 DIAGNOSIS — Z6841 Body Mass Index (BMI) 40.0 and over, adult: Secondary | ICD-10-CM | POA: Diagnosis not present

## 2019-06-17 DIAGNOSIS — R739 Hyperglycemia, unspecified: Secondary | ICD-10-CM | POA: Insufficient documentation

## 2019-06-17 DIAGNOSIS — T380X5A Adverse effect of glucocorticoids and synthetic analogues, initial encounter: Secondary | ICD-10-CM | POA: Diagnosis present

## 2019-06-17 DIAGNOSIS — R221 Localized swelling, mass and lump, neck: Secondary | ICD-10-CM | POA: Diagnosis not present

## 2019-06-17 DIAGNOSIS — E242 Drug-induced Cushing's syndrome: Secondary | ICD-10-CM | POA: Diagnosis not present

## 2019-06-17 DIAGNOSIS — Z20822 Contact with and (suspected) exposure to covid-19: Secondary | ICD-10-CM | POA: Diagnosis present

## 2019-06-17 DIAGNOSIS — T428X5A Adverse effect of antiparkinsonism drugs and other central muscle-tone depressants, initial encounter: Secondary | ICD-10-CM | POA: Diagnosis present

## 2019-06-17 DIAGNOSIS — K649 Unspecified hemorrhoids: Secondary | ICD-10-CM | POA: Diagnosis present

## 2019-06-17 DIAGNOSIS — R159 Full incontinence of feces: Secondary | ICD-10-CM | POA: Diagnosis present

## 2019-06-17 DIAGNOSIS — Z7952 Long term (current) use of systemic steroids: Secondary | ICD-10-CM

## 2019-06-17 DIAGNOSIS — R7989 Other specified abnormal findings of blood chemistry: Secondary | ICD-10-CM | POA: Diagnosis not present

## 2019-06-17 DIAGNOSIS — R531 Weakness: Secondary | ICD-10-CM | POA: Diagnosis not present

## 2019-06-17 DIAGNOSIS — G8929 Other chronic pain: Secondary | ICD-10-CM | POA: Diagnosis present

## 2019-06-17 DIAGNOSIS — Z981 Arthrodesis status: Secondary | ICD-10-CM

## 2019-06-17 DIAGNOSIS — Z923 Personal history of irradiation: Secondary | ICD-10-CM | POA: Diagnosis not present

## 2019-06-17 DIAGNOSIS — E876 Hypokalemia: Secondary | ICD-10-CM | POA: Diagnosis present

## 2019-06-17 DIAGNOSIS — Z96651 Presence of right artificial knee joint: Secondary | ICD-10-CM | POA: Diagnosis present

## 2019-06-17 DIAGNOSIS — R32 Unspecified urinary incontinence: Secondary | ICD-10-CM | POA: Diagnosis present

## 2019-06-17 DIAGNOSIS — E273 Drug-induced adrenocortical insufficiency: Secondary | ICD-10-CM | POA: Diagnosis present

## 2019-06-17 DIAGNOSIS — R0902 Hypoxemia: Secondary | ICD-10-CM | POA: Diagnosis not present

## 2019-06-17 DIAGNOSIS — M316 Other giant cell arteritis: Secondary | ICD-10-CM | POA: Diagnosis present

## 2019-06-17 DIAGNOSIS — E874 Mixed disorder of acid-base balance: Secondary | ICD-10-CM | POA: Diagnosis not present

## 2019-06-17 DIAGNOSIS — G4733 Obstructive sleep apnea (adult) (pediatric): Secondary | ICD-10-CM | POA: Diagnosis present

## 2019-06-17 DIAGNOSIS — M5412 Radiculopathy, cervical region: Secondary | ICD-10-CM | POA: Diagnosis present

## 2019-06-17 DIAGNOSIS — E7800 Pure hypercholesterolemia, unspecified: Secondary | ICD-10-CM | POA: Diagnosis present

## 2019-06-17 DIAGNOSIS — E785 Hyperlipidemia, unspecified: Secondary | ICD-10-CM | POA: Diagnosis present

## 2019-06-17 DIAGNOSIS — M5134 Other intervertebral disc degeneration, thoracic region: Secondary | ICD-10-CM | POA: Diagnosis present

## 2019-06-17 DIAGNOSIS — M79609 Pain in unspecified limb: Secondary | ICD-10-CM

## 2019-06-17 DIAGNOSIS — B370 Candidal stomatitis: Secondary | ICD-10-CM | POA: Diagnosis present

## 2019-06-17 DIAGNOSIS — F329 Major depressive disorder, single episode, unspecified: Secondary | ICD-10-CM | POA: Diagnosis present

## 2019-06-17 DIAGNOSIS — N390 Urinary tract infection, site not specified: Secondary | ICD-10-CM | POA: Diagnosis not present

## 2019-06-17 DIAGNOSIS — M2550 Pain in unspecified joint: Secondary | ICD-10-CM | POA: Diagnosis not present

## 2019-06-17 DIAGNOSIS — Z9049 Acquired absence of other specified parts of digestive tract: Secondary | ICD-10-CM

## 2019-06-17 DIAGNOSIS — L03116 Cellulitis of left lower limb: Secondary | ICD-10-CM | POA: Diagnosis not present

## 2019-06-17 DIAGNOSIS — J302 Other seasonal allergic rhinitis: Secondary | ICD-10-CM | POA: Diagnosis present

## 2019-06-17 DIAGNOSIS — K760 Fatty (change of) liver, not elsewhere classified: Secondary | ICD-10-CM | POA: Diagnosis not present

## 2019-06-17 DIAGNOSIS — R5381 Other malaise: Secondary | ICD-10-CM | POA: Diagnosis not present

## 2019-06-17 DIAGNOSIS — N179 Acute kidney failure, unspecified: Secondary | ICD-10-CM | POA: Diagnosis not present

## 2019-06-17 DIAGNOSIS — N181 Chronic kidney disease, stage 1: Secondary | ICD-10-CM | POA: Diagnosis present

## 2019-06-17 DIAGNOSIS — K2100 Gastro-esophageal reflux disease with esophagitis, without bleeding: Secondary | ICD-10-CM | POA: Diagnosis present

## 2019-06-17 DIAGNOSIS — Z853 Personal history of malignant neoplasm of breast: Secondary | ICD-10-CM

## 2019-06-17 DIAGNOSIS — R0602 Shortness of breath: Secondary | ICD-10-CM | POA: Diagnosis not present

## 2019-06-17 DIAGNOSIS — Z79899 Other long term (current) drug therapy: Secondary | ICD-10-CM | POA: Diagnosis not present

## 2019-06-17 DIAGNOSIS — Z90710 Acquired absence of both cervix and uterus: Secondary | ICD-10-CM

## 2019-06-17 DIAGNOSIS — T43015A Adverse effect of tricyclic antidepressants, initial encounter: Secondary | ICD-10-CM | POA: Diagnosis present

## 2019-06-17 DIAGNOSIS — L538 Other specified erythematous conditions: Secondary | ICD-10-CM | POA: Diagnosis not present

## 2019-06-17 DIAGNOSIS — W19XXXA Unspecified fall, initial encounter: Secondary | ICD-10-CM | POA: Diagnosis not present

## 2019-06-17 DIAGNOSIS — Z794 Long term (current) use of insulin: Secondary | ICD-10-CM

## 2019-06-17 DIAGNOSIS — E1122 Type 2 diabetes mellitus with diabetic chronic kidney disease: Secondary | ICD-10-CM | POA: Diagnosis present

## 2019-06-17 DIAGNOSIS — Z7401 Bed confinement status: Secondary | ICD-10-CM | POA: Diagnosis not present

## 2019-06-17 HISTORY — DX: Type 2 diabetes mellitus without complications: E11.9

## 2019-06-17 LAB — GAMMA GT: GGT: 419 U/L — ABNORMAL HIGH (ref 7–50)

## 2019-06-17 LAB — HEPATITIS PANEL, ACUTE
HCV Ab: NONREACTIVE
Hep A IgM: NONREACTIVE
Hep B C IgM: NONREACTIVE
Hepatitis B Surface Ag: NONREACTIVE

## 2019-06-17 LAB — POCT I-STAT EG7
Acid-Base Excess: 11 mmol/L — ABNORMAL HIGH (ref 0.0–2.0)
Bicarbonate: 32.1 mmol/L — ABNORMAL HIGH (ref 20.0–28.0)
Calcium, Ion: 1.06 mmol/L — ABNORMAL LOW (ref 1.15–1.40)
HCT: 41 % (ref 36.0–46.0)
Hemoglobin: 13.9 g/dL (ref 12.0–15.0)
O2 Saturation: 79 %
Potassium: 3.9 mmol/L (ref 3.5–5.1)
Sodium: 132 mmol/L — ABNORMAL LOW (ref 135–145)
TCO2: 33 mmol/L — ABNORMAL HIGH (ref 22–32)
pCO2, Ven: 30.8 mmHg — ABNORMAL LOW (ref 44.0–60.0)
pH, Ven: 7.627 (ref 7.250–7.430)
pO2, Ven: 34 mmHg (ref 32.0–45.0)

## 2019-06-17 LAB — CBC WITH DIFFERENTIAL/PLATELET
Abs Immature Granulocytes: 0.44 10*3/uL — ABNORMAL HIGH (ref 0.00–0.07)
Basophils Absolute: 0.1 10*3/uL (ref 0.0–0.1)
Basophils Relative: 1 %
Eosinophils Absolute: 0 10*3/uL (ref 0.0–0.5)
Eosinophils Relative: 0 %
HCT: 42.2 % (ref 36.0–46.0)
Hemoglobin: 13.8 g/dL (ref 12.0–15.0)
Immature Granulocytes: 3 %
Lymphocytes Relative: 12 %
Lymphs Abs: 1.9 10*3/uL (ref 0.7–4.0)
MCH: 31.8 pg (ref 26.0–34.0)
MCHC: 32.7 g/dL (ref 30.0–36.0)
MCV: 97.2 fL (ref 80.0–100.0)
Monocytes Absolute: 0.4 10*3/uL (ref 0.1–1.0)
Monocytes Relative: 2 %
Neutro Abs: 12.8 10*3/uL — ABNORMAL HIGH (ref 1.7–7.7)
Neutrophils Relative %: 82 %
Platelets: 236 10*3/uL (ref 150–400)
RBC: 4.34 MIL/uL (ref 3.87–5.11)
RDW: 13.9 % (ref 11.5–15.5)
WBC: 15.6 10*3/uL — ABNORMAL HIGH (ref 4.0–10.5)
nRBC: 2.2 % — ABNORMAL HIGH (ref 0.0–0.2)

## 2019-06-17 LAB — CBG MONITORING, ED
Glucose-Capillary: 308 mg/dL — ABNORMAL HIGH (ref 70–99)
Glucose-Capillary: 255 mg/dL — ABNORMAL HIGH (ref 70–99)
Glucose-Capillary: 243 mg/dL — ABNORMAL HIGH (ref 70–99)

## 2019-06-17 LAB — HEMOGLOBIN A1C
Hgb A1c MFr Bld: 12.5 % — ABNORMAL HIGH (ref 4.8–5.6)
Mean Plasma Glucose: 312.05 mg/dL

## 2019-06-17 LAB — COMPREHENSIVE METABOLIC PANEL
ALT: 96 U/L — ABNORMAL HIGH (ref 0–44)
AST: 52 U/L — ABNORMAL HIGH (ref 15–41)
Albumin: 2.7 g/dL — ABNORMAL LOW (ref 3.5–5.0)
Alkaline Phosphatase: 192 U/L — ABNORMAL HIGH (ref 38–126)
Anion gap: 18 — ABNORMAL HIGH (ref 5–15)
BUN: 23 mg/dL (ref 8–23)
CO2: 25 mmol/L (ref 22–32)
Calcium: 9.7 mg/dL (ref 8.9–10.3)
Chloride: 91 mmol/L — ABNORMAL LOW (ref 98–111)
Creatinine, Ser: 0.89 mg/dL (ref 0.44–1.00)
GFR calc Af Amer: >60 mL/min (ref >60)
GFR calc non Af Amer: >60 mL/min (ref >60)
Glucose, Bld: 321 mg/dL — ABNORMAL HIGH (ref 70–99)
Potassium: 4.1 mmol/L (ref 3.5–5.1)
Sodium: 134 mmol/L — ABNORMAL LOW (ref 135–145)
Total Bilirubin: 1.3 mg/dL — ABNORMAL HIGH (ref 0.3–1.2)
Total Protein: 5.9 g/dL — ABNORMAL LOW (ref 6.5–8.1)

## 2019-06-17 LAB — POCT I-STAT 7, (LYTES, BLD GAS, ICA,H+H)
Acid-Base Excess: 9 mmol/L — ABNORMAL HIGH (ref 0.0–2.0)
Bicarbonate: 31.5 mmol/L — ABNORMAL HIGH (ref 20.0–28.0)
Calcium, Ion: 1.22 mmol/L (ref 1.15–1.40)
HCT: 34 % — ABNORMAL LOW (ref 36.0–46.0)
Hemoglobin: 11.6 g/dL — ABNORMAL LOW (ref 12.0–15.0)
O2 Saturation: 96 %
Patient temperature: 98.9
Potassium: 3.3 mmol/L — ABNORMAL LOW (ref 3.5–5.1)
Sodium: 132 mmol/L — ABNORMAL LOW (ref 135–145)
TCO2: 33 mmol/L — ABNORMAL HIGH (ref 22–32)
pCO2 arterial: 34.8 mmHg (ref 32.0–48.0)
pH, Arterial: 7.565 — ABNORMAL HIGH (ref 7.350–7.450)
pO2, Arterial: 73 mmHg — ABNORMAL LOW (ref 83.0–108.0)

## 2019-06-17 LAB — LACTIC ACID, PLASMA: Lactic Acid, Venous: 3.4 mmol/L (ref 0.5–1.9)

## 2019-06-17 LAB — PROTIME-INR
INR: 0.9 (ref 0.8–1.2)
Prothrombin Time: 12.5 seconds (ref 11.4–15.2)

## 2019-06-17 LAB — MAGNESIUM: Magnesium: 2.1 mg/dL (ref 1.7–2.4)

## 2019-06-17 LAB — BRAIN NATRIURETIC PEPTIDE: B Natriuretic Peptide: 144.8 pg/mL — ABNORMAL HIGH (ref 0.0–100.0)

## 2019-06-17 LAB — CORTISOL: Cortisol, Plasma: 30.1 ug/dL

## 2019-06-17 LAB — TSH: TSH: 1.36 u[IU]/mL (ref 0.350–4.500)

## 2019-06-17 LAB — LIPASE, BLOOD: Lipase: 23 U/L (ref 11–51)

## 2019-06-17 LAB — PHOSPHORUS: Phosphorus: 2.4 mg/dL — ABNORMAL LOW (ref 2.5–4.6)

## 2019-06-17 LAB — SARS CORONAVIRUS 2 (TAT 6-24 HRS): SARS Coronavirus 2: NEGATIVE

## 2019-06-17 LAB — CK: Total CK: 34 U/L — ABNORMAL LOW (ref 38–234)

## 2019-06-17 LAB — TROPONIN I (HIGH SENSITIVITY)
Troponin I (High Sensitivity): 63 ng/L — ABNORMAL HIGH (ref <18)
Troponin I (High Sensitivity): 68 ng/L — ABNORMAL HIGH (ref <18)

## 2019-06-17 LAB — LACTATE DEHYDROGENASE: LDH: 444 U/L — ABNORMAL HIGH (ref 98–192)

## 2019-06-17 MED ORDER — ACETAMINOPHEN 325 MG PO TABS
650.0000 mg | ORAL_TABLET | Freq: Four times a day (QID) | ORAL | Status: DC | PRN
  Administered 2019-06-18 – 2019-06-25 (×11): 650 mg via ORAL
  Filled 2019-06-17 (×11): qty 2

## 2019-06-17 MED ORDER — INSULIN ASPART 100 UNIT/ML ~~LOC~~ SOLN
0.0000 [IU] | Freq: Three times a day (TID) | SUBCUTANEOUS | Status: DC
  Administered 2019-06-17: 8 [IU] via SUBCUTANEOUS
  Administered 2019-06-18: 5 [IU] via SUBCUTANEOUS
  Administered 2019-06-18: 15 [IU] via SUBCUTANEOUS

## 2019-06-17 MED ORDER — INSULIN ASPART 100 UNIT/ML ~~LOC~~ SOLN
0.0000 [IU] | Freq: Every day | SUBCUTANEOUS | Status: DC
  Administered 2019-06-17: 2 [IU] via SUBCUTANEOUS
  Administered 2019-06-18: 4 [IU] via SUBCUTANEOUS
  Administered 2019-06-19: 3 [IU] via SUBCUTANEOUS
  Administered 2019-06-20 – 2019-06-21 (×2): 4 [IU] via SUBCUTANEOUS
  Administered 2019-06-22: 3 [IU] via SUBCUTANEOUS
  Administered 2019-06-23: 2 [IU] via SUBCUTANEOUS

## 2019-06-17 MED ORDER — PANTOPRAZOLE SODIUM 40 MG PO TBEC
40.0000 mg | DELAYED_RELEASE_TABLET | Freq: Every day | ORAL | Status: DC
  Administered 2019-06-18 – 2019-06-25 (×8): 40 mg via ORAL
  Filled 2019-06-17 (×8): qty 1

## 2019-06-17 MED ORDER — CEPHALEXIN 500 MG PO CAPS
500.0000 mg | ORAL_CAPSULE | Freq: Two times a day (BID) | ORAL | Status: DC
  Filled 2019-06-17: qty 1

## 2019-06-17 MED ORDER — SODIUM CHLORIDE 0.9 % IV BOLUS
500.0000 mL | Freq: Once | INTRAVENOUS | Status: AC
  Administered 2019-06-17: 500 mL via INTRAVENOUS

## 2019-06-17 MED ORDER — SODIUM CHLORIDE 0.9 % IV SOLN
1.0000 g | Freq: Once | INTRAVENOUS | Status: AC
  Administered 2019-06-17: 1 g via INTRAVENOUS
  Filled 2019-06-17: qty 10

## 2019-06-17 MED ORDER — ACETAMINOPHEN 650 MG RE SUPP: 650.0000 mg | Freq: Four times a day (QID) | RECTAL | Status: DC | PRN

## 2019-06-17 MED ORDER — PREDNISONE 50 MG PO TABS
80.0000 mg | ORAL_TABLET | Freq: Every day | ORAL | Status: DC
  Administered 2019-06-18: 80 mg via ORAL
  Filled 2019-06-17: qty 1

## 2019-06-17 MED ORDER — SIMVASTATIN 20 MG PO TABS
20.0000 mg | ORAL_TABLET | Freq: Every day | ORAL | Status: DC
  Administered 2019-06-17 – 2019-06-18 (×2): 20 mg via ORAL
  Filled 2019-06-17 (×2): qty 1

## 2019-06-17 MED ORDER — CLOTRIMAZOLE 10 MG MT TROC
10.0000 mg | Freq: Every day | OROMUCOSAL | Status: DC
  Administered 2019-06-17 – 2019-06-18 (×4): 10 mg via ORAL
  Filled 2019-06-17 (×6): qty 1

## 2019-06-17 MED ORDER — FLUTICASONE PROPIONATE 50 MCG/ACT NA SUSP
2.0000 | Freq: Every day | NASAL | Status: DC
  Administered 2019-06-18 – 2019-06-25 (×8): 2 via NASAL
  Filled 2019-06-17: qty 16

## 2019-06-17 MED ORDER — POLYETHYLENE GLYCOL 3350 17 G PO PACK: 17.0000 g | PACK | Freq: Every day | ORAL | Status: DC | PRN

## 2019-06-17 MED ORDER — ENOXAPARIN SODIUM 40 MG/0.4ML ~~LOC~~ SOLN
40.0000 mg | SUBCUTANEOUS | Status: DC
  Administered 2019-06-17 – 2019-06-24 (×8): 40 mg via SUBCUTANEOUS
  Filled 2019-06-17 (×8): qty 0.4

## 2019-06-17 NOTE — ED Notes (Signed)
CBG Results of 243 reported to Judson Roch, Therapist, sports.

## 2019-06-17 NOTE — H&P (Signed)
Patient seen and evaluated before 5 PM.  Full note to follow once I discuss with the resident and their note is completed.

## 2019-06-17 NOTE — H&P (Signed)
[facility] Admission History and Physical Service Pager: [phone]  Patient name: [name] Medical record number: [id number] Date of birth: 1945/07/08 Age: 73 y.o. Gender: female  Primary Care Provider: Leonard Downing, MD Consultants: GI, endocrinology Code Status: Full  Chief Complaint: Weakness, inability to stand, dysphagia  Assessment and Plan: [name] is a 73 y.o. female presenting with 1 day history of generalized full body weakness resulting in fall and inability to stand.  Patient currently on high-dose prednisone daily since April for temporal arteritis and has been unable to wean successfully.  Also complaining of thrush and dysphagia since August resulting in poor p.o. intake of solids.  PMH is significant for temporal arteritis, cervical radiculopathy and surgical repair of vertebral discs, headaches.  Generalized weakness-patient has been on chronic prednisone since April she reports.  Tried to wean down, but did not tolerate it after 1 day of weaning after developing weakness.  Was originally treated with steroids for temporal arteritis diagnosed with clinical exam and elevated ESR.  Patient states she could not stand up after falling down yesterday and has not tried to stand since then.  On physical exam her strength appears to be equal bilaterally.  I did not have her stand or sit up, but strength in her arms and legs appears to be at least 4/ 5 and symmetric.  No ataxia seen.  No dysarthria, FND's, or other signs of stroke.  Lab work shows low CK, low phosphorus, normal hemoglobin.  Did not indicate a myositis.  It would appear the most likely cause of her weakness is iatrogenic Cushing syndrome from her chronic steroid use.  It is still possible she has something like polymyositis, dermatomyositis, polymyalgia rheumatica, but less likely.  It is also possible she has a brainstem or cerebellar stroke causing this weakness, but  based off of my physical exam I am less concerned about stroke.  We will need to consult endocrinology, and possibly rheumatology, but neither of these consults are options in amion.  May need to consult neurology if we suspect neurological cause. -Admit to observation, MedSurg.  Dr. Gwendlyn Deutscher attending -Restart her prednisone  59m daily.  -Follow-up current lab work including LDyer -A.m. labs, CMP, CBC, esr, crp -Consult endocrinology in a.m. -Vitals per routine -Pulse ox with vitals -Up with assistance -PT/OT eval and treat  Dysphagia/thrush -thrush visible on size of tongue.  Top of tongue appears atrophic and red.  Patient has reportedly tried antifungal swish and 1 tablet of an antifungal medication yesterday.  Patient also describing a "sticky" sensation when trying to swallow solids and has not been able to swallow solids in quite some time.  In the ED clotrimazole troches was ordered.  We will continue this.  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  Have ordered B12 and folate tests.  Need to consider GI consult for EGD to rule out candidal esophagitis. -Clotrimazole troches twice daily -Follow-up B12 and folate labs -N.p.o. until speech eval can do swallow study. -Consider GI consult in a.m. for EGD -Head and neck soft tissue ultrasound to evaluate the mass in the anterior neck  Urinary and bowel incontinence-patient uncertain on timeframe for onset of symptoms.  No saddle anesthesia.  Has had multiple spinal surgeries in the past for disc issues and radiculopathy. -Consider neurology consult if we suspect an acute issue.  Do not believe  this is an acute concern at the moment.  Combined respiratory/metabolic alkalosis -I am uncertain of what the cause of this combined alkalosis is.  VBG was initially 7.6, repeat ABG was 7.5.  CO2 was 30, bicarb was 33 on ABG and to 25  on blood test.  Both of these appear to be mild and I would not necessarily expect it to cause this drastic increase in pH.  Cushing syndrome can metabolic alkalosis, but I am unsure why the respiratory drive did not compensate it is in fact also alkalotic.  Not tachypneic on exam   Elevated liver enzymes - ALT 96, AST 52.  T bili 1.3.  Alk phos 192.  Patient has no diagnosed liver disease I am aware of.  Does take statin, but patient does not exhibit any right upper quadrant tenderness or pain. -RUQ ultrasound to evaluate for NAFLD -Morning CMP -Follow-up GGT, hepatitis panel  Cellulitis-left lower extremity in the lower tibia, ankle and foot is red, hot to touch, tender, swollen.  Patient has chronic swelling of that leg, but other findings are new.  Patient received a dose of ceftriaxone in the ED.  Has leukocytosis in the 15 range, no fever.  No skin abrasion/ulcer or other sign of source of infection but this does look like cellulitis. -Keflex 500 mg twice daily -Continue to monitor  Seasonal allergies-patient takes Allegra 100 mg daily and Flonase daily.  No signs of allergies currently. -We will hold off on Allegra as it is nonformulary and patient is in her 17s, I do not want to start one of the other second-generation antihistamines -Continue Flonase daily  Hyperglycemia-patient has CBGs in the 2 50-3 50 range.  Never diagnosed with diabetes prior to this.  Likely from chronic steroid use and Cushing syndrome. -Moderate sliding scale insulin with meals and nightly -Monitor CBGs -Follow-up A1c  Tachycardia/elevated troponin-uncertain if this is her normal rate, if this is due to poor p.o. intake and perhaps some dehydration (although no AKI present), or other illness.  No chest pain reported by patient.  Troponin initially was mildly elevated at 63.  This is most likely demand ischemia from tachycardia.  EKG was normal.  Echo in August was normal. -Trend troponin  FEN/GI: N.p.o.,  pantoprazole Prophylaxis: Lovenox  Disposition: MedSurg  History of Present Illness:  [name] is a 73 y.o. female presenting with a 1 day history of weakness.  Patient fell last night from her bed.  She is slipped on a doggy pad that was placed there for her urinary incontinence.  She lacked the strength to get up and her husband and husband's friend helped her get up and put her back onto the bed.  She remained on her bed until this morning when the EMT brought her to the hospital.  Patient states that she has been on prednisone "550 mg" since April when she was diagnosed by her primary care doctor with temporal arteritis due to headaches for the previous few years and an elevated ESR.  No temporal biopsy was performed.  Patient states that she tried to wean off of the prednisone previously, she does not know what month, but after 1 day of decreasing from 5-1/2 tablets to 5 tablets, she developed extreme weakness that did not resolve for a few days.  She is now back on 5-1/2 tablets and has remained there.  Patient denies headache, chest pain, tachycardia, nausea or vomiting, fever/chills, dizziness, vertigo.  Patient states that she has had thrush and  difficulty swallowing since August when she had a surgery done on her cervical vertebrae for radiculopathy.  She states she has had 3 such surgeries on various disks.  She has difficulty swallowing solids, but not liquids.  She does endorse some pain with swallowing, but mostly states that it "gets stuck" or "feel sticky".  She drinks boost, 2 a day, and drinks plenty of water.  Her physician gave her an oral antifungal solution which she tried, but had no effect.  He then prescribed a antifungal pill, which she states she took yesterday.  She does not know the name of it.  Patient takes pantoprazole daily for stomach ulcers.   Patient states she has had urinary and bowel incontinence for some time now.  She cannot specifically state how long she  has had this incontinence, but it has been after her prednisone started.  Patient has some blood in stools, but states she has hemorrhoids.  No overt bleeding.  Patient has swelling long-term of her left leg for which she takes furosemide daily.  She does not know what the cause of her swelling is.  Has never had a surgery on that leg.  Did have knee replacement on the right side.  To her knowledge has no history of kidney disorder or liver disorder.  Is not aware of any ulcers on her legs.  States her leg is usually not warm to the touch or painful, but was this way today.  No shortness of breath.  No chest pain.  Review Of Systems: Per HPI with the following additions:   ROS  Patient Active Problem List   Diagnosis Date Noted  . Weakness 06/17/2019  . Cervical radiculopathy at C8 01/31/2019  . Right arm numbness 01/30/2019  . Chronic migraine 12/06/2017  . Chronic intractable headache 10/23/2017  . Cervical myelopathy with cervical radiculopathy 02/15/2016    Past Medical History: Past Medical History:  Diagnosis Date  . ADD (attention deficit disorder)   . Anxiety   . Arthritis    os  . Diabetes mellitus without complication ([place])   . GERD (gastroesophageal reflux disease)    barretts  . Headache   . High cholesterol   . Neck pain   . Obesity   . Personal history of radiation therapy 2005  . Sleep apnea    cpap   last sleep study> 3 yrs  . Swelling of both ankles     Past Surgical History: Past Surgical History:  Procedure Laterality Date  . ABDOMINAL HYSTERECTOMY    . ANTERIOR CERVICAL DECOMP/DISCECTOMY FUSION N/A 02/15/2016   Procedure: Cervical three four-Cervical five-six, Cervical six-seven Anterior cervical decompression/diskectomy/fusion;  Surgeon: Kristeen Miss, MD;  Location: [facility];  Service: Neurosurgery;  Laterality: N/A;  . bicept surgery    . BREAST LUMPECTOMY    . CHOLECYSTECTOMY    . JOINT REPLACEMENT    . PAROTID GLAND TUMOR EXCISION    .  TONSILLECTOMY    . TOTAL KNEE ARTHROPLASTY  07/19/2012   Procedure: TOTAL KNEE ARTHROPLASTY;  Surgeon: Yvette Rack., MD;  Location: [facility];  Service: Orthopedics;  Laterality: Right;  right total knee arthroplasty  . TUBAL LIGATION      Social History: Social History   Tobacco Use  . Smoking status: Former Smoker    Quit date: 07/11/1997    Years since quitting: 21.9  . Smokeless tobacco: Never Used  Substance Use Topics  . Alcohol use: Not Currently  . Drug use: No  Additional social history: Does not drink, smoke, do drugs.  Lives with husband. Please also refer to relevant sections of EMR.  Family History: Family History  Problem Relation Age of Onset  . Breast cancer Maternal Aunt   . Breast cancer Paternal Aunt   . Lung cancer Mother        age 73  . Colon cancer Father        age 64     Allergies and Medications: Allergies  Allergen Reactions  . Nickel Hives and Rash   No current facility-administered medications on file prior to encounter.   Current Outpatient Medications on File Prior to Encounter  Medication Sig Dispense Refill  . Cholecalciferol (VITAMIN D3) 5000 units CAPS Take 5,000 Units by mouth daily.     . citalopram (CELEXA) 40 MG tablet Take 40 mg by mouth daily.      . diphenhydrAMINE (BENADRYL) 25 mg capsule Take 25 mg by mouth at bedtime.    . ferrous sulfate 325 (65 FE) MG tablet Take 325 mg by mouth daily with breakfast.    . fexofenadine (ALLEGRA) 180 MG tablet Take 180 mg by mouth daily.     . fluticasone (FLONASE) 50 MCG/ACT nasal spray Place 1 spray into the nose 2 (two) times daily.     . furosemide (LASIX) 80 MG tablet Take 80 mg by mouth daily.    . methylphenidate (RITALIN) 10 MG tablet Take 10 mg by mouth daily as needed (focus).     . nortriptyline (PAMELOR) 10 MG capsule TAKE 2 CAPSULES BY MOUTH AT BEDTIME (Patient taking differently: Take 20 mg by mouth at bedtime. ) 180 capsule 1  . pantoprazole (PROTONIX) 40 MG tablet Take 40 mg  by mouth daily.    . predniSONE (STERAPRED UNI-PAK 21 TAB) 10 MG (21) TBPK tablet Take 6 tablets on day 1 and then on day 2, 5 tablets on day 3 and day 4, 4 tablets on day 5 and day 6, 3 tablets on day 7 and 8, 2 tablets on day 9 and 10, 1 tablet on day 11 and 12 and then stop after 42 tablet 0  . rizatriptan (MAXALT) 10 MG tablet Take 10 mg by mouth every 2 (two) hours as needed for migraine. Max of 3 tablets in a 24 hour period    . simvastatin (ZOCOR) 20 MG tablet Take 20 mg by mouth at bedtime.     . tizanidine (ZANAFLEX) 2 MG capsule TAKE 1 CAPSULE BY MOUTH THREE TIMES DAILY (Patient taking differently: Take 2 mg by mouth 3 (three) times daily. ) 90 capsule 5    Objective: BP 109/66   Pulse (!) 119   Temp 98.9 F (37.2 C) (Oral)   Resp 20   SpO2 97%  Exam: General: Alert and oriented.  Laying in bed.  Obese elderly female. Eyes: PERRLA.  EOMI.  No scleral icterus.  No exophthalmos. ENTM: Tongue is smooth on top, with white appearing sides. Neck: Patient has palpable indurated mass approximately 3 cm in diameter in the subcutaneous tissue on her neck.  No thyromegaly. Cardiovascular: Tachycardic rate.  Normal rhythm.  No murmur.  2+ pulse bilaterally. Respiratory: Lungs clear to auscultation bilaterally.  No wheezes, no crackles.  No tachypnea, no increased work of breathing. Gastrointestinal: Large pannus.  Nontender to palpation.  Normal bowel sounds. MSK: 4/5 strength bilaterally in upper and lower extremities. Extremities: Left leg larger than right side below the knee.  2+ pitting edema on the left side  going up to the mid tibia.   Derm: Foot and ankle on left side is erythematous and warm to touch when compared to the right side.  No ulcers noted. Neuro: Sensation to light touch intact in the feet bilaterally as well as hands bilaterally.  Cranial nerves II through XII grossly intact.  No focal neural deficits. Psych: Pleasant affect.  Normal speech.  Makes eye contact.  Labs  and Imaging: CBC BMET  Recent Labs  Lab 06/17/19 1248 06/17/19 1253  WBC 15.6*  --   HGB 13.8 13.9  HCT 42.2 41.0  PLT 236  --    Recent Labs  Lab 06/17/19 1248 06/17/19 1253  NA 134* 132*  K 4.1 3.9  CL 91*  --   CO2 25  --   BUN 23  --   CREATININE 0.89  --   GLUCOSE 321*  --   CALCIUM 9.7  --       Benay Pike, MD 06/17/2019, 3:37 PM PGY-2, [place] [facility] pager: [phone], text pages welcome

## 2019-06-17 NOTE — Progress Notes (Signed)
Left lower extremity venous duplex has been completed. Preliminary results can be found in CV Proc through chart review.  Results were given to Dr. Johnney Killian.   06/17/19 1:13 PM [name] RVT

## 2019-06-17 NOTE — ED Notes (Signed)
Patient transported to Ultrasound 

## 2019-06-17 NOTE — ED Triage Notes (Signed)
Pt from home with co weakness x2 weeks. Pt able to stand. vss with ems. CBG with ems was 388 pt has been taking a high dose prednisone for headaches.

## 2019-06-17 NOTE — Progress Notes (Signed)
Inpatient Diabetes Program Recommendations  AACE/ADA: New Consensus Statement on Inpatient Glycemic Control (2015)  Target Ranges:  Prepandial:   less than 140 mg/dL      Peak postprandial:   less than 180 mg/dL (1-2 hours)      Critically ill patients:  140 - 180 mg/dL   Lab Results  Component Value Date   GLUCAP 308 (H) 06/17/2019   HGBA1C 6.1 (H) 01/31/2019    Review of Glycemic Control Results for [name], [name]" (MRN [id number]) as of 06/17/2019 16:03  Ref. Range 06/17/2019 10:36  Glucose-Capillary Latest Ref Range: 70 - 99 mg/dL 308 (H)   Diabetes history: Type 2 DM Outpatient Diabetes medications: none, Prednisone 120 mg QD Current orders for Inpatient glycemic control: none  Inpatient Diabetes Program Recommendations:    Consider:  -Novolog 0-15 units TID & HS -A1C  Thanks, Bronson Curb, MSN, RNC-OB Diabetes Coordinator [phone] (8a-5p)

## 2019-06-17 NOTE — Discharge Summary (Addendum)
[facility] Discharge Summary  Patient name: [name] Medical record number: [id number] Date of birth: 11-05-1945 Age: 73 y.o. Gender: female Date of Admission: 06/17/2019  Date of Discharge: 06/25/19 Admitting Physician: Lind Covert, MD  Primary Care Provider: Leonard Downing, MD Consultants: None  Indication for Hospitalization: Weakness, dysphagia  Discharge Diagnoses/Problem List:  Active Problems:   Generalized weakness   Cellulitis of leg, left   Cushingoid side effect of steroids ([place])   Weakness   Polypharmacy   Uncontrolled type 2 diabetes mellitus with hyperglycemia ([place])  Disposition: SNF  Discharge Condition: stable  Discharge Exam:  General: Alert and oriented in no apparent distress Heart: Regular rate and rhythm with no murmurs appreciated Lungs: CTA bilaterally, no wheezing Abdomen: Bowel sounds present, no abdominal pain Skin: Warm and dry  Brief Hospital Course:  [name] is a 73 y.o. female presenting with 1 day history of generalized full body weakness resulting in fall and inability to stand.  Patient currently on high-dose prednisone daily since April for temporal arteritis and has been unable to wean successfully.  Also complaining of thrush and dysphagia since August resulting in poor p.o. intake of solids.  PMH is significant for temporal arteritis, cervical radiculopathy and surgical repair of vertebral discs, headaches.  Generalized weakness Patient initially presenting for generalized weakness.  Initially was very unclear what could have caused this.  Significant work-up performed in hospital.  Differentials included possible steroid use as patient has been on steroids for some time, questionable whether she started in April versus October as patient is poor historian.  Also considered nerve pathology but MRIs did not show significant nerve impingement.  Idiopathic etiologies were also  considered as patient has significant polypharmacy.  Is on multiple medication such as tizanidine, nortriptyline, increased prednisone doses, Percocet, Fioricet.  Patient is a poor historian and was not able to clearly report one medication she is on her doses.  PT and OT worked with patient and recommended SNF placement.  Given that patient has been on a long term prednisone course it was determined to wean patient off of prednisone.   Current prednisone taper: 80 mg, 60 mg for 2 days, 40 mg for 2 days, 20 mg for 7 days, 10 mg for 7 days, 5 mg for 2 weeks, 4 mg for 2 weeks, 3 mg for 2 weeks, 2 mg for 2 weeks, 1 mg for 2 weeks.  At this time PCP will determine remainder of prednisone taper.  Polypharmacy During initial hospitalization patient was on multiple medications include nortriptyline, tizanidine, and a very high prednisone dose.  Prednisone and tizanidine were tapered during this admission until patient was weaned off of tizanidine completely.  Patient continued on her nortriptyline during this hospitalization.  For her prednisone please see taper above.  Neck mass Patient with anterior neck mass on admission.  TSH was within normal limits.  Ultrasound did not show significant thyroid dysfunction or malignancy.  CT also was negative.  Patient should follow-up with PCP.  Thrush Found to have candidal infection thought to be esophagitis.  Likely secondary to long-term steroid use.  Patient was started on oral fluconazole and was to continue this for 20 days.  GI was consulted for possible endoscopy.  Recommended that if there is no improvement after 3 to 5 days of using fluconazole they are to be reconsulted for work-up.  Until then plan was to continue fluconazole and hope for improvement.  Diet was slowly advanced  until tolerated.  Cellulitis Patient with left lower extremity cellulitis.  DVT ultrasound was performed in emergency department was negative.  Was started on ceftriaxone x1 day and  transition to Keflex 500 mg every 12 hours x4 days for total treatment course of 5 days of antibiotics.  Cellulitis improved during admission.  Bladder and bowel incontinence Patient with reports of bladder and bowel incontinence.  MRI did not show cauda equina.  Patient to follow-up with PCP.  Hyperglycemia With hyperglycemia on admission likely secondary to increase prednisone.  A1c was 12.5.  Previously on Metformin twice daily which is started earlier in the year.  Required insulin during admission of Lantus 30 and sliding scale insulin.  Incidental Findings on CT   Mediastinal LAD  Retroperitoneal LAD  Hx of radiation therapy. Recommended CT chest, abdomen, pelvis outpatient   #Neck mass  No findings on ultrasound. No findings on CT w/ contrast soft tissue neck. No further work up. Patient reports this started after her cervical surgery which she reports was an anterior procedure.   Other issues to follow up outpatient   NAFLD  transaminitis  Elevated GGT   Chronic Headaches   Chronic Neck Pain  Hx of anterior cervical Decompression C3-C7  Cervical Radiculopathy at C8  Issues for Follow Up:  1. Continue prednisone taper, see documentation above, PCP determine continuation of prednisone taper after patient completes taper down to 1mg /day 2. Consider GI referral if no improvement on fluconazole  3. F/u bowel and bladder incontinence 4. Elevated ALP on admission, consider w/u  5. Enlarged mediastinal lymph node on CT soft tissue neck, recommend CT chest for further evaluation. Please consider obtaining this. 6. PCP recommend repeat check of creatinine in outpatient due to bump in creatinine during inpatient status. 7. PCP consider whether to restart lasix, lasix as was held in hospital. 8. Consider reducing insulin as patient tapers on steroid dose 9. During the hospitalization patient was tapered from her tizanidine and discharged without this medication. 10. During her  hospitalization she continued with her nortriptyline and was discharged on this.  Significant Procedures:   Significant Labs and Imaging:  Recent Labs  Lab 06/23/19 0646 06/24/19 0443 06/25/19 0205  WBC 9.4 7.5 8.8  HGB 12.1 11.4* 11.8*  HCT 38.7 36.1 36.6  PLT 296 274 308   Recent Labs  Lab 06/19/19 0759 06/21/19 0232 06/23/19 0517 06/24/19 0443  NA 137 135 138 139  K 3.3* 4.5 4.9 4.7  CL 99 103 106 108  CO2 28 22 22  21*  GLUCOSE 175* 252* 141* 158*  BUN 12 14 15 15   CREATININE 0.86 0.86 1.22* 0.93  CALCIUM 8.8* 9.1 9.3 9.4  PHOS 2.5  --   --   --   ALKPHOS 143* 164*  --   --   AST 31 34  --   --   ALT 66* 66*  --   --   ALBUMIN 2.0* 2.2*  --   --      CT SOFT TISSUE NECK W CONTRAST  Result Date: 06/18/2019 CLINICAL DATA:  Anterior neck mass EXAM: CT NECK WITH CONTRAST TECHNIQUE: Multidetector CT imaging of the neck was performed using the standard protocol following the bolus administration of intravenous contrast. CONTRAST:  6mL OMNIPAQUE IOHEXOL 300 MG/ML  SOLN COMPARISON:  None FINDINGS: Pharynx and larynx: Normal. No mass or swelling. Salivary glands: Fatty replacement of the submandibular and parotid glands. Right parotid is relatively smaller with some distortion of the skin surface; correlate with  history of surgery. Thyroid: Mildly heterogeneous without definite focal lesion. Lymph nodes: There are no pathologically cervical or supraclavicular nodes. Asymmetric nonenlarged 1.2 cm right level 2 node, which could be reactive. Enlarged mediastinal lymph node measuring 2.7 cm on series 3, image 109. Vascular: Major neck vessels are patent. Limited intracranial: No abnormal enhancement. Visualized orbits: Unremarkable. Mastoids and visualized paranasal sinuses: Aerated. Skeleton: Post anterior fusion at C3-C4 as well as C5-T1. Multilevel degenerative changes. Upper chest: No apical lung mass. Other: None. IMPRESSION: No anterior neck mass identified. Enlarged mediastinal  lymph node. Chest CT is recommended as there is no comparison available. Electronically Signed   By: Macy Mis M.D.   On: 06/18/2019 14:22   MR THORACIC SPINE WO CONTRAST  Result Date: 06/18/2019 CLINICAL DATA:  Generalized weakness. Spinal stenosis. EXAM: MRI THORACIC SPINE WITHOUT CONTRAST TECHNIQUE: Multiplanar, multisequence MR imaging of the thoracic spine was performed. No intravenous contrast was administered. COMPARISON:  None. FINDINGS: Alignment:  Normal. Vertebrae: Partially visualized multilevel anterior cervical spine fusion extending to T1. No thoracic spine fracture, suspicious marrow lesion, or significant marrow edema. Cord:  Normal signal and morphology. Paraspinal and other soft tissues: 1.3 cm T2 hyperintense lesion in the posterior right hepatic lobe without a corresponding abnormality on a 2007 abdominal CT. Disc levels: Mild disc bulging at most levels in the thoracic spine. Moderate facet hypertrophy in the upper and lower thoracic spine. No significant spinal stenosis or spinal cord mass effect. No compressive neural foraminal stenosis. IMPRESSION: 1. Mild disc and moderate facet degeneration in the thoracic spine without evidence of neural impingement. 2. 1.3 cm posterior right hepatic lobe lesion, not evident on a 2007 abdominal CT and indeterminate. This could be further evaluated with a contrast-enhanced abdominal MRI. Electronically Signed   By: Logan Bores M.D.   On: 06/18/2019 19:48   MR LUMBAR SPINE WO CONTRAST  Result Date: 06/18/2019 CLINICAL DATA:  Generalized weakness. Spinal stenosis. EXAM: MRI LUMBAR SPINE WITHOUT CONTRAST TECHNIQUE: Multiplanar, multisequence MR imaging of the lumbar spine was performed. No intravenous contrast was administered. COMPARISON:  06/14/2014 FINDINGS: The study is intermittently motion degraded including moderate artifact on the axial T1 sequence. Segmentation: Standard. Alignment: Trace retrolisthesis of L1 on L2 and L5 on S1.  Slight left convex curvature of the lumbar spine. Vertebrae: No fracture, suspicious osseous lesion, or significant marrow edema. Mild chronic degenerative endplate changes at X33443 and L5-S1. Conus medullaris and cauda equina: Conus extends to the L1 level. Conus and cauda equina appear normal. Paraspinal and other soft tissues: Unchanged small right renal cyst. New mildly enlarged para-aortic and bilateral common iliac chain lymph nodes measuring up to 1.1 cm in short axis. Disc levels: Disc desiccation throughout the lumbar spine. Moderate disc space narrowing at L1-2 and L5-S1 with mild narrowing at L2-3. L1-2: Progressive disc bulging eccentric to the right and mild facet and ligamentum flavum hypertrophy result in new mild spinal stenosis and mild-to-moderate right neural foraminal stenosis with potential right L1 nerve impingement. L2-3: Mild disc bulging, a small right foraminal disc protrusion, and mild-to-moderate facet and ligamentum flavum hypertrophy result in mild right neural foraminal stenosis without significant spinal stenosis, unchanged. L3-4: Disc bulging, a shallow left subarticular disc protrusion, congenitally short pedicles, mild ligamentum flavum hypertrophy, and severe facet hypertrophy result in mild-to-moderate spinal stenosis and mild right and mild-to-moderate left lateral recess stenosis, unchanged. Patent neural foramina. L4-5: Disc bulging, congenitally short pedicles, mild ligamentum flavum hypertrophy, and severe facet hypertrophy result in severe spinal stenosis, slightly  progressed from prior. Patent neural foramina. Left larger than right facet joint effusions. L5-S1: Disc bulging, endplate spurring, disc space height loss, and mild facet hypertrophy result in borderline to mild spinal and lateral recess stenosis and mild bilateral neural foraminal stenosis, unchanged. IMPRESSION: 1. Slight progression of severe multifactorial spinal stenosis at L4-5. 2. Mildly progressive disc  degeneration at L1-2 with new mild spinal stenosis and mild-to-moderate right neural foraminal stenosis. 3. Unchanged mild-to-moderate spinal and lateral recess stenosis at L3-4. 4. New mild retroperitoneal lymphadenopathy. Given partially visualized mediastinal lymphadenopathy on neck CT today, consider chest, abdomen, and pelvis CT for further evaluation. Electronically Signed   By: Logan Bores M.D.   On: 06/18/2019 20:15   US SOFT TISSUE HEAD & NECK (NON-THYROID)  Result Date: 06/17/2019 CLINICAL DATA:  Swelling of the neck. EXAM: ULTRASOUND OF HEAD/NECK SOFT TISSUES TECHNIQUE: Ultrasound examination of the head and neck soft tissues was performed in the area of clinical concern. COMPARISON:  None. FINDINGS: In the patient's area of concern in the right neck, there is no significant sonographic abnormality. There is no drainable fluid collection. There is no mass. IMPRESSION: No sonographic abnormality corresponding to the patient's area of concern. If there is persistent clinical concern, follow-up with a contrast-enhanced CT is recommended. Electronically Signed   By: Constance Holster M.D.   On: 06/17/2019 20:25   DG Chest Port 1 View  Result Date: 06/17/2019 CLINICAL DATA:  73 year old female with a history of weakness and short of breath. EXAM: PORTABLE CHEST 1 VIEW COMPARISON:  01/30/2019 FINDINGS: Cardiomediastinal silhouette unchanged in size and contour. No evidence of central vascular congestion. No interlobular septal thickening. No pneumothorax or pleural effusion. No confluent airspace disease. Low lung volumes. Surgical changes of the cervical region. IMPRESSION: Low lung volumes without evidence of acute cardiopulmonary disease. Electronically Signed   By: Corrie Mckusick D.O.   On: 06/17/2019 12:32   VAS [place] LOWER EXTREMITY VENOUS (DVT) ([facility] and [facility] 7a-7p)  Result Date: 06/17/2019  Lower Venous Study Indications: Swelling, Erythema, and Pain.  Risk Factors: None identified.  Limitations: Body habitus, poor ultrasound/tissue interface and patient pain tolerance. Comparison Study: No prior studies. Performing Technologist: Oliver Hum RVT  Examination Guidelines: A complete evaluation includes B-mode imaging, spectral Doppler, color Doppler, and power Doppler as needed of all accessible portions of each vessel. Bilateral testing is considered an integral part of a complete examination. Limited examinations for reoccurring indications may be performed as noted.  +-----+---------------+---------+-----------+----------+--------------+ RIGHTCompressibilityPhasicitySpontaneityPropertiesThrombus Aging +-----+---------------+---------+-----------+----------+--------------+ CFV  Full           Yes      Yes                                 +-----+---------------+---------+-----------+----------+--------------+   +---------+---------------+---------+-----------+----------+--------------+ LEFT     CompressibilityPhasicitySpontaneityPropertiesThrombus Aging +---------+---------------+---------+-----------+----------+--------------+ CFV      Full           Yes      Yes                                 +---------+---------------+---------+-----------+----------+--------------+ SFJ      Full                                                        +---------+---------------+---------+-----------+----------+--------------+  FV Prox  Full                                                        +---------+---------------+---------+-----------+----------+--------------+ FV Mid   Full                                                        +---------+---------------+---------+-----------+----------+--------------+ FV DistalFull                                                        +---------+---------------+---------+-----------+----------+--------------+ PFV      Full                                                         +---------+---------------+---------+-----------+----------+--------------+ POP      Full           Yes      Yes                                 +---------+---------------+---------+-----------+----------+--------------+ PTV      Full                                                        +---------+---------------+---------+-----------+----------+--------------+ PERO     Full                                                        +---------+---------------+---------+-----------+----------+--------------+     Summary: Right: No evidence of common femoral vein obstruction. Left: There is no evidence of deep vein thrombosis in the lower extremity. However, portions of this examination were limited- see technologist comments above. No cystic structure found in the popliteal fossa.  *See table(s) above for measurements and observations. Electronically signed by Servando Snare MD on 06/17/2019 at 3:47:01 PM.    Final    US Abdomen Limited RUQ  Result Date: 06/17/2019 CLINICAL DATA:  Elevated LFTs EXAM: ULTRASOUND ABDOMEN LIMITED RIGHT UPPER QUADRANT COMPARISON:  CT dated January 05, 2006 FINDINGS: Gallbladder: Surgically absent Common bile duct: Diameter: 6 mm Liver: Diffuse increased echogenicity with slightly heterogeneous liver. Appearance typically secondary to fatty infiltration. Fibrosis secondary consideration. No secondary findings of cirrhosis noted. No focal hepatic lesion or intrahepatic biliary duct dilatation. Portal vein is patent on color Doppler imaging with normal direction of blood flow towards the liver. Other: . IMPRESSION: 1. No acute abnormality. The patient is status post cholecystectomy. 2. Hepatic steatosis. Electronically Signed  By: Constance Holster M.D.   On: 06/17/2019 20:23     Results/Tests Pending at Time of Discharge: None  Discharge Medications:  Allergies as of 06/25/2019      Reactions   Nickel Hives, Rash      Medication List    STOP taking these  medications   furosemide 80 MG tablet Commonly known as: LASIX     TAKE these medications   fluconazole 200 MG tablet Commonly known as: DIFLUCAN Take 1 tablet (200 mg total) by mouth daily for 13 days.   fluticasone 50 MCG/ACT nasal spray Commonly known as: FLONASE Place 1 spray into the nose 2 (two) times daily.   insulin aspart 100 UNIT/ML injection Commonly known as: novoLOG Inject 0-9 Units into the skin 3 (three) times daily with meals.   insulin glargine 100 UNIT/ML injection Commonly known as: LANTUS Inject 0.3 mLs (30 Units total) into the skin daily.   nortriptyline 10 MG capsule Commonly known as: PAMELOR TAKE 2 CAPSULES BY MOUTH AT BEDTIME   pantoprazole 40 MG tablet Commonly known as: PROTONIX Take 40 mg by mouth daily.   predniSONE 20 MG tablet Commonly known as: DELTASONE Take 1 tablet (20 mg total) by mouth daily with breakfast. Start taking on: June 26, 2019 What changed: You were already taking a medication with the same name, and this prescription was added. Make sure you understand how and when to take each.   predniSONE 10 MG tablet Commonly known as: DELTASONE Take 1 tablet (10 mg total) by mouth daily with breakfast for 7 days. Start taking on: July 03, 2019 What changed: You were already taking a medication with the same name, and this prescription was added. Make sure you understand how and when to take each.   predniSONE 5 MG tablet Commonly known as: DELTASONE Take 1 tablet (5 mg total) by mouth daily with breakfast for 14 days. Start taking on: July 10, 2019 What changed:   medication strength  how much to take  These instructions start on July 10, 2019. If you are unsure what to do until then, ask your doctor or other care provider.   predniSONE 1 MG tablet Commonly known as: DELTASONE Take 4 tablets (4 mg total) by mouth daily with breakfast for 14 days. Start taking on: July 24, 2019 What changed: You were already  taking a medication with the same name, and this prescription was added. Make sure you understand how and when to take each.   predniSONE 1 MG tablet Commonly known as: DELTASONE Take 3 tablets (3 mg total) by mouth daily with breakfast for 14 days. Start taking on: August 07, 2019 What changed: You were already taking a medication with the same name, and this prescription was added. Make sure you understand how and when to take each.   predniSONE 1 MG tablet Commonly known as: DELTASONE Take 2 tablets (2 mg total) by mouth daily with breakfast for 14 days. Start taking on: August 21, 2019 What changed: You were already taking a medication with the same name, and this prescription was added. Make sure you understand how and when to take each.   predniSONE 1 MG tablet Commonly known as: DELTASONE Take 1 tablet (1 mg total) by mouth daily with breakfast for 14 days. Start taking on: September 04, 2019 What changed: You were already taking a medication with the same name, and this prescription was added. Make sure you understand how and when to take each.   simvastatin  20 MG tablet Commonly known as: ZOCOR Take 20 mg by mouth at bedtime.   Vitamin D3 125 MCG (5000 UT) Caps Take 5,000 Units by mouth daily.       Discharge Instructions: Please refer to Patient Instructions section of EMR for full details.  Patient was counseled important signs and symptoms that should prompt return to medical care, changes in medications, dietary instructions, activity restrictions, and follow up appointments.   Follow-Up Appointments: Contact information for after-discharge care    Destination    [facility] .   Service: Skilled Nursing Contact information: [place] [address], [place], [place] 06/25/2019, 11:16 AM PGY-1, [place]

## 2019-06-17 NOTE — ED Notes (Signed)
Pt transported to [facility]

## 2019-06-17 NOTE — ED Provider Notes (Signed)
[facility] Provider Note   CSN: [id number] Arrival date & time: 06/17/19  1021     History Chief Complaint  Patient presents with  . Weakness    [name] is a 73 y.o. female.  HPI Patient reports she has become so weak today that she cannot walk.  She reports her weakness is muscular in nature.  Her legs and arms cannot lift her body.  She reports she does not feel like she is going to pass out or lose consciousness.  She reports these symptoms have been progressive and intractable starting last August.  Patient described being diagnosed with temporal arteritis and having been on steroids since August.  (EMR discharge [phone] indicates patient was started on steroids at that time due to fairly severe C8 radiculopathy with right arm numbness and weakness).  Patient indicates that since that time her family physician, Dr. Arelia Sneddon, has continued her on steroids.  She reports anytime he has tried to wean her off she become extremely weak and has to have her dose increased again.  Patient describes being on high doses of steroids consistently since August.  She reports she has had a lot of difficulty with oral intake.  Her mouth is severely dry and "sticks together".  She reports she can only taken some liquids and thin soups.  She has not had vomiting.  No fever.  I inquired about the fullness and swelling beneath her chin and the patient reports has been there for several weeks.  She reports her doctor tried a course of antibiotics but it did not get any better.  She reports due to her weakness she has fallen once but denies she sustained any injuries.  I note that the patient's left lower extremity is much more edematous than the right and also is erythematous.  She denies she has ever had an ultrasound to evaluate for DVT.  She reports she has been on Lasix for that swelling.    Past Medical History:  Diagnosis Date  . ADD (attention deficit  disorder)   . Anxiety   . Arthritis    os  . Diabetes mellitus without complication ([place])   . GERD (gastroesophageal reflux disease)    barretts  . Headache   . High cholesterol   . Neck pain   . Obesity   . Personal history of radiation therapy 2005  . Sleep apnea    cpap   last sleep study> 3 yrs  . Swelling of both ankles     Patient Active Problem List   Diagnosis Date Noted  . Cervical radiculopathy at C8 01/31/2019  . Right arm numbness 01/30/2019  . Chronic migraine 12/06/2017  . Chronic intractable headache 10/23/2017  . Cervical myelopathy with cervical radiculopathy 02/15/2016    Past Surgical History:  Procedure Laterality Date  . ABDOMINAL HYSTERECTOMY    . ANTERIOR CERVICAL DECOMP/DISCECTOMY FUSION N/A 02/15/2016   Procedure: Cervical three four-Cervical five-six, Cervical six-seven Anterior cervical decompression/diskectomy/fusion;  Surgeon: Kristeen Miss, MD;  Location: [facility];  Service: Neurosurgery;  Laterality: N/A;  . bicept surgery    . BREAST LUMPECTOMY    . CHOLECYSTECTOMY    . JOINT REPLACEMENT    . PAROTID GLAND TUMOR EXCISION    . TONSILLECTOMY    . TOTAL KNEE ARTHROPLASTY  07/19/2012   Procedure: TOTAL KNEE ARTHROPLASTY;  Surgeon: Yvette Rack., MD;  Location: [facility];  Service: Orthopedics;  Laterality: Right;  right total knee  arthroplasty  . TUBAL LIGATION       OB History   No obstetric history on file.     Family History  Problem Relation Age of Onset  . Breast cancer Maternal Aunt   . Breast cancer Paternal Aunt   . Lung cancer Mother        age 66  . Colon cancer Father        age 43    Social History   Tobacco Use  . Smoking status: Former Smoker    Quit date: 07/11/1997    Years since quitting: 21.9  . Smokeless tobacco: Never Used  Substance Use Topics  . Alcohol use: Not Currently  . Drug use: No    Home Medications Prior to Admission medications   Medication Sig Start Date End Date Taking? Authorizing  Provider  Cholecalciferol (VITAMIN D3) 5000 units CAPS Take 5,000 Units by mouth daily.     [provider]  citalopram (CELEXA) 40 MG tablet Take 40 mg by mouth daily.      [provider]  diphenhydrAMINE (BENADRYL) 25 mg capsule Take 25 mg by mouth at bedtime.    [provider]  ferrous sulfate 325 (65 FE) MG tablet Take 325 mg by mouth daily with breakfast.    [provider]  fexofenadine (ALLEGRA) 180 MG tablet Take 180 mg by mouth daily.     [provider]  fluticasone (FLONASE) 50 MCG/ACT nasal spray Place 1 spray into the nose 2 (two) times daily.     [provider]  furosemide (LASIX) 80 MG tablet Take 80 mg by mouth daily. 09/18/18   [provider]  methylphenidate (RITALIN) 10 MG tablet Take 10 mg by mouth daily as needed (focus).     [provider]  nortriptyline (PAMELOR) 10 MG capsule TAKE 2 CAPSULES BY MOUTH AT BEDTIME Patient taking differently: Take 20 mg by mouth at bedtime.  09/30/18   Marcial Pacas, MD  pantoprazole (PROTONIX) 40 MG tablet Take 40 mg by mouth daily.    [provider]  predniSONE (STERAPRED UNI-PAK 21 TAB) 10 MG (21) TBPK tablet Take 6 tablets on day 1 and then on day 2, 5 tablets on day 3 and day 4, 4 tablets on day 5 and day 6, 3 tablets on day 7 and 8, 2 tablets on day 9 and 10, 1 tablet on day 11 and 12 and then stop after 02/01/19   Raiford Noble Latif, DO  rizatriptan (MAXALT) 10 MG tablet Take 10 mg by mouth every 2 (two) hours as needed for migraine. Max of 3 tablets in a 24 hour period 08/30/18   [provider]  simvastatin (ZOCOR) 20 MG tablet Take 20 mg by mouth at bedtime.     [provider]  tizanidine (ZANAFLEX) 2 MG capsule TAKE 1 CAPSULE BY MOUTH THREE TIMES DAILY Patient taking differently: Take 2 mg by mouth 3 (three) times daily.  06/10/18   Marcial Pacas, MD    Allergies    Nickel  Review of Systems   Review of Systems 10 Systems reviewed and  are negative for acute change except as noted in the HPI.  Physical Exam Updated Vital Signs BP 109/66   Pulse (!) 119   Temp 98.9 F (37.2 C) (Oral)   Resp 20   SpO2 97%   Physical Exam Constitutional:      Comments: Alert with clear mental status.  No respiratory distress at rest.  Central obesity.  HENT:     Head:     Comments: Normocephalic atraumatic.  Patient does have pronounced submental adipose tissue with a firmer quality than typical.  However, this does not feel suggestive of Ludwig's angina.  Neck is supple.  See attached image.    Nose: Nose normal.     Mouth/Throat:     Comments: Mucous membranes are fairly dry and shiny with glossitis and white patches adherent to the roof of the mouth and the tongue. Eyes:     Extraocular Movements: Extraocular movements intact.     Pupils: Pupils are equal, round, and reactive to light.  Neck:     Comments: Neck is supple.  No meningismus Cardiovascular:     Rate and Rhythm: Regular rhythm. Tachycardia present.     Pulses: Normal pulses.  Pulmonary:     Effort: Pulmonary effort is normal.     Breath sounds: Normal breath sounds.  Abdominal:     Comments: Abdomen is obese and distended with striae present.  Nontender to palpation.  Musculoskeletal:        General: Normal range of motion.     Comments: Left lower extremity has 2+ edema as compared to the right lower extremity.  There is diffuse erythema and warmth.  The right lower extremity has crenulated skin on the foot.  Suggests previous edema now resolved.  Skin:    General: Skin is warm and dry.  Neurological:     General: No focal deficit present.     Mental Status: She is oriented to person, place, and time.     Cranial Nerves: No cranial nerve deficit.     Coordination: Coordination normal.     Comments: Patient has no focal neurologic deficits.  Speech is clear with normal cognitive function.  She can follow all commands for movement of both upper and lower  extremities without motor deficit.  Psychiatric:        Mood and Affect: Mood normal.         ED Results / Procedures / Treatments   Labs (all labs ordered are listed, but only abnormal results are displayed) Labs Reviewed  COMPREHENSIVE METABOLIC PANEL - Abnormal; Notable for the following components:      Result Value   Sodium 134 (*)    Chloride 91 (*)    Glucose, Bld 321 (*)    Total Protein 5.9 (*)    Albumin 2.7 (*)    AST 52 (*)    ALT 96 (*)    Alkaline Phosphatase 192 (*)    Total Bilirubin 1.3 (*)    Anion gap 18 (*)    All other components within normal limits  BRAIN NATRIURETIC PEPTIDE - Abnormal; Notable for the following components:   B Natriuretic Peptide 144.8 (*)    All other components within normal limits  LACTIC ACID, PLASMA - Abnormal; Notable for the following components:   Lactic Acid, Venous 3.4 (*)    All other components within normal limits  CBC WITH DIFFERENTIAL/PLATELET - Abnormal; Notable for the following components:   WBC 15.6 (*)    nRBC 2.2 (*)    Neutro Abs 12.8 (*)    Abs Immature Granulocytes 0.44 (*)    All other components within normal limits  PHOSPHORUS - Abnormal; Notable for the following components:   Phosphorus 2.4 (*)    All other components within normal limits  CK - Abnormal; Notable for the following components:   Total CK 34 (*)    All  other components within normal limits  CBG MONITORING, ED - Abnormal; Notable for the following components:   Glucose-Capillary 308 (*)    All other components within normal limits  POCT I-STAT EG7 - Abnormal; Notable for the following components:   pH, Ven 7.627 (*)    pCO2, Ven 30.8 (*)    Bicarbonate 32.1 (*)    TCO2 33 (*)    Acid-Base Excess 11.0 (*)    Sodium 132 (*)    Calcium, Ion 1.06 (*)    All other components within normal limits  TROPONIN I (HIGH SENSITIVITY) - Abnormal; Notable for the following components:   Troponin I (High Sensitivity) 63 (*)    All other  components within normal limits  SARS CORONAVIRUS 2 (TAT 6-24 HRS)  LIPASE, BLOOD  PROTIME-INR  MAGNESIUM  CORTISOL  TSH  LACTIC ACID, PLASMA  URINALYSIS, ROUTINE W REFLEX MICROSCOPIC  BLOOD GAS, VENOUS  T3, FREE  T4  TROPONIN I (HIGH SENSITIVITY)    EKG EKG Interpretation  Date/Time:  Tuesday June 17 2019 10:31:35 EST Ventricular Rate:  124 PR Interval:    QRS Duration: 71 QT Interval:  289 QTC Calculation: 415 R Axis:   34 Text Interpretation: Sinus tachycardia Probable left atrial enlargement When compared with ECG of 01/30/2019, Nonspecific ST abnormality is no longer present HEART RATE has increased Confirmed by Delora Fuel ([id number]) on 06/17/2019 11:43:37 PM   Radiology DG Chest Port 1 View  Result Date: 06/17/2019 CLINICAL DATA:  73 year old female with a history of weakness and short of breath. EXAM: PORTABLE CHEST 1 VIEW COMPARISON:  01/30/2019 FINDINGS: Cardiomediastinal silhouette unchanged in size and contour. No evidence of central vascular congestion. No interlobular septal thickening. No pneumothorax or pleural effusion. No confluent airspace disease. Low lung volumes. Surgical changes of the cervical region. IMPRESSION: Low lung volumes without evidence of acute cardiopulmonary disease. Electronically Signed   By: Corrie Mckusick D.O.   On: 06/17/2019 12:32   VAS [place] LOWER EXTREMITY VENOUS (DVT) ([facility] 7a-7p)  Result Date: 06/17/2019  Lower Venous Study Indications: Swelling, Erythema, and Pain.  Risk Factors: None identified. Limitations: Body habitus, poor ultrasound/tissue interface and patient pain tolerance. Comparison Study: No prior studies. Performing Technologist: Oliver Hum RVT  Examination Guidelines: A complete evaluation includes B-mode imaging, spectral Doppler, color Doppler, and power Doppler as needed of all accessible portions of each vessel. Bilateral testing is considered an integral part of a complete examination. Limited examinations  for reoccurring indications may be performed as noted.  +-----+---------------+---------+-----------+----------+--------------+ RIGHTCompressibilityPhasicitySpontaneityPropertiesThrombus Aging +-----+---------------+---------+-----------+----------+--------------+ CFV  Full           Yes      Yes                                 +-----+---------------+---------+-----------+----------+--------------+   +---------+---------------+---------+-----------+----------+--------------+ LEFT     CompressibilityPhasicitySpontaneityPropertiesThrombus Aging +---------+---------------+---------+-----------+----------+--------------+ CFV      Full           Yes      Yes                                 +---------+---------------+---------+-----------+----------+--------------+ SFJ      Full                                                        +---------+---------------+---------+-----------+----------+--------------+  FV Prox  Full                                                        +---------+---------------+---------+-----------+----------+--------------+ FV Mid   Full                                                        +---------+---------------+---------+-----------+----------+--------------+ FV DistalFull                                                        +---------+---------------+---------+-----------+----------+--------------+ PFV      Full                                                        +---------+---------------+---------+-----------+----------+--------------+ POP      Full           Yes      Yes                                 +---------+---------------+---------+-----------+----------+--------------+ PTV      Full                                                        +---------+---------------+---------+-----------+----------+--------------+ PERO     Full                                                         +---------+---------------+---------+-----------+----------+--------------+     Summary: Right: No evidence of common femoral vein obstruction. Left: There is no evidence of deep vein thrombosis in the lower extremity. However, portions of this examination were limited- see technologist comments above. No cystic structure found in the popliteal fossa.  *See table(s) above for measurements and observations.    Preliminary     Procedures Procedures (including critical care time) CRITICAL CARE Performed by: Charlesetta Shanks   Total critical care time: 30 minutes  Critical care time was exclusive of separately billable procedures and treating other patients.  Critical care was necessary to treat or prevent imminent or life-threatening deterioration.  Critical care was time spent personally by me on the following activities: development of treatment plan with patient and/or surrogate as well as nursing, discussions with consultants, evaluation of patient's response to treatment, examination of patient, obtaining history from patient or surrogate, ordering and performing treatments and interventions, ordering and review of laboratory studies, ordering and review of radiographic studies, pulse oximetry and re-evaluation of patient's  condition. Medications Ordered in ED Medications  clotrimazole (MYCELEX) troche 10 mg (10 mg Oral Given 06/17/19 1358)  cefTRIAXone (ROCEPHIN) 1 g in sodium chloride 0.9 % 100 mL IVPB (1 g Intravenous New Bag/Given 06/17/19 1410)  sodium chloride 0.9 % bolus 500 mL (500 mLs Intravenous New Bag/Given 06/17/19 1410)    ED Course  I have reviewed the triage vital signs and the nursing notes.  Pertinent labs & imaging results that were available during my care of the patient were reviewed by me and considered in my medical decision making (see chart for details).  Clinical Course as of Jun 17 1100  Tue Jun 17, 2019  1458 Consult: Dr. Jeannine Kitten of family medicine accepts for  admission.   [MP]    Clinical Course User Index [MP] Charlesetta Shanks, MD   MDM Rules/Calculators/A&P                      Patient presents with severe generalized weakness.  Possible etiology is prolonged high-dose steroids with patient having a cushingoid appearance.  She also has isolated left lower extremity edema and erythema suggestive of cellulitis.  DVT ruled out by ultrasound.  Patient however is nontoxic with clear mental status.  Although she is tachycardic, I doubt sepsis at this time.  She shows no signs of being encephalopathic.  She does have mild lactic acidosis and leukocytosis which may be steroid-induced.  As well, she has hyperglycemia.  Will initiate fluid resuscitation and antibiotics for his cellulitis.  Patient does not have a history or any focal motor deficits to suggest stroke.  Patient will require admission for further diagnostic evaluation and treatment. Final Clinical Impression(s) / ED Diagnoses Final diagnoses:  Mass in neck  Generalized weakness  Cellulitis of leg, left  Cushingoid side effect of steroids [facility])    Rx / DC Orders ED Discharge Orders    None       Charlesetta Shanks, MD 06/18/19 1113

## 2019-06-17 NOTE — ED Notes (Signed)
Lab called critical value lactic acid 3.4 reported Dr Johnney Killian.

## 2019-06-18 ENCOUNTER — Observation Stay (HOSPITAL_COMMUNITY): Payer: Medicare Other

## 2019-06-18 ENCOUNTER — Encounter: Payer: Self-pay | Admitting: Family Medicine

## 2019-06-18 DIAGNOSIS — R531 Weakness: Secondary | ICD-10-CM | POA: Diagnosis not present

## 2019-06-18 DIAGNOSIS — E242 Drug-induced Cushing's syndrome: Principal | ICD-10-CM

## 2019-06-18 DIAGNOSIS — L03116 Cellulitis of left lower limb: Secondary | ICD-10-CM | POA: Diagnosis not present

## 2019-06-18 DIAGNOSIS — R7989 Other specified abnormal findings of blood chemistry: Secondary | ICD-10-CM | POA: Diagnosis not present

## 2019-06-18 LAB — FOLATE: Folate: 25.6 ng/mL (ref >5.9)

## 2019-06-18 LAB — COMPREHENSIVE METABOLIC PANEL
ALT: 91 U/L — ABNORMAL HIGH (ref 0–44)
AST: 44 U/L — ABNORMAL HIGH (ref 15–41)
Albumin: 2.6 g/dL — ABNORMAL LOW (ref 3.5–5.0)
Alkaline Phosphatase: 181 U/L — ABNORMAL HIGH (ref 38–126)
Anion gap: 16 — ABNORMAL HIGH (ref 5–15)
BUN: 16 mg/dL (ref 8–23)
CO2: 27 mmol/L (ref 22–32)
Calcium: 9.3 mg/dL (ref 8.9–10.3)
Chloride: 92 mmol/L — ABNORMAL LOW (ref 98–111)
Creatinine, Ser: 1.04 mg/dL — ABNORMAL HIGH (ref 0.44–1.00)
GFR calc Af Amer: >60 mL/min (ref >60)
GFR calc non Af Amer: 54 mL/min — ABNORMAL LOW (ref >60)
Glucose, Bld: 221 mg/dL — ABNORMAL HIGH (ref 70–99)
Potassium: 3.3 mmol/L — ABNORMAL LOW (ref 3.5–5.1)
Sodium: 135 mmol/L (ref 135–145)
Total Bilirubin: 0.9 mg/dL (ref 0.3–1.2)
Total Protein: 5.9 g/dL — ABNORMAL LOW (ref 6.5–8.1)

## 2019-06-18 LAB — URINALYSIS, ROUTINE W REFLEX MICROSCOPIC
Bilirubin Urine: NEGATIVE
Glucose, UA: >=500 mg/dL — AB
Ketones, ur: 5 mg/dL — AB
Nitrite: POSITIVE — AB
Protein, ur: 30 mg/dL — AB
Specific Gravity, Urine: 1.026 (ref 1.005–1.030)
WBC, UA: >50 WBC/hpf — ABNORMAL HIGH (ref 0–5)
pH: 6 (ref 5.0–8.0)

## 2019-06-18 LAB — BLOOD GAS, ARTERIAL
Acid-Base Excess: 6.9 mmol/L — ABNORMAL HIGH (ref 0.0–2.0)
Bicarbonate: 29.9 mmol/L — ABNORMAL HIGH (ref 20.0–28.0)
Drawn by: 28098
FIO2: 21
O2 Saturation: 94.2 %
Patient temperature: 37
pCO2 arterial: 35.4 mmHg (ref 32.0–48.0)
pH, Arterial: 7.536 — ABNORMAL HIGH (ref 7.350–7.450)
pO2, Arterial: 67.8 mmHg — ABNORMAL LOW (ref 83.0–108.0)

## 2019-06-18 LAB — GLUCOSE, CAPILLARY
Glucose-Capillary: 240 mg/dL — ABNORMAL HIGH (ref 70–99)
Glucose-Capillary: 367 mg/dL — ABNORMAL HIGH (ref 70–99)
Glucose-Capillary: 540 mg/dL (ref 70–99)
Glucose-Capillary: 418 mg/dL — ABNORMAL HIGH (ref 70–99)
Glucose-Capillary: 350 mg/dL — ABNORMAL HIGH (ref 70–99)

## 2019-06-18 LAB — TROPONIN I (HIGH SENSITIVITY)
Troponin I (High Sensitivity): 98 ng/L — ABNORMAL HIGH (ref <18)
Troponin I (High Sensitivity): 96 ng/L — ABNORMAL HIGH (ref <18)
Troponin I (High Sensitivity): 88 ng/L — ABNORMAL HIGH (ref <18)

## 2019-06-18 LAB — RAPID URINE DRUG SCREEN, HOSP PERFORMED
Amphetamines: NOT DETECTED
Barbiturates: NOT DETECTED
Benzodiazepines: NOT DETECTED
Cocaine: NOT DETECTED
Opiates: NOT DETECTED
Tetrahydrocannabinol: NOT DETECTED

## 2019-06-18 LAB — VITAMIN B12: Vitamin B-12: 364 pg/mL (ref 180–914)

## 2019-06-18 LAB — CBC
HCT: 39.4 % (ref 36.0–46.0)
Hemoglobin: 13.3 g/dL (ref 12.0–15.0)
MCH: 32.4 pg (ref 26.0–34.0)
MCHC: 33.8 g/dL (ref 30.0–36.0)
MCV: 95.9 fL (ref 80.0–100.0)
Platelets: 269 10*3/uL (ref 150–400)
RBC: 4.11 MIL/uL (ref 3.87–5.11)
RDW: 14.3 % (ref 11.5–15.5)
WBC: 12.5 10*3/uL — ABNORMAL HIGH (ref 4.0–10.5)
nRBC: 2.6 % — ABNORMAL HIGH (ref 0.0–0.2)

## 2019-06-18 LAB — LACTIC ACID, PLASMA: Lactic Acid, Venous: 3.1 mmol/L (ref 0.5–1.9)

## 2019-06-18 LAB — SEDIMENTATION RATE: Sed Rate: 55 mm/hr — ABNORMAL HIGH (ref 0–22)

## 2019-06-18 LAB — HIV ANTIBODY (ROUTINE TESTING W REFLEX): HIV Screen 4th Generation wRfx: NONREACTIVE

## 2019-06-18 LAB — CORTISOL: Cortisol, Plasma: 29.6 ug/dL

## 2019-06-18 LAB — ETHANOL: Alcohol, Ethyl (B): <10 mg/dL (ref <10)

## 2019-06-18 LAB — C-REACTIVE PROTEIN: CRP: 11.4 mg/dL — ABNORMAL HIGH (ref <1.0)

## 2019-06-18 LAB — T4: T4, Total: 8.7 ug/dL (ref 4.5–12.0)

## 2019-06-18 MED ORDER — PREDNISONE 20 MG PO TABS: 40.0000 mg | ORAL_TABLET | Freq: Every day | ORAL | Status: DC

## 2019-06-18 MED ORDER — INSULIN ASPART 100 UNIT/ML ~~LOC~~ SOLN
8.0000 [IU] | Freq: Once | SUBCUTANEOUS | Status: AC
  Administered 2019-06-18: 8 [IU] via SUBCUTANEOUS

## 2019-06-18 MED ORDER — INSULIN ASPART 100 UNIT/ML ~~LOC~~ SOLN
0.0000 [IU] | Freq: Three times a day (TID) | SUBCUTANEOUS | Status: DC
  Administered 2019-06-19: 11 [IU] via SUBCUTANEOUS
  Administered 2019-06-19: 7 [IU] via SUBCUTANEOUS
  Administered 2019-06-19 – 2019-06-20 (×2): 4 [IU] via SUBCUTANEOUS
  Administered 2019-06-20: 15 [IU] via SUBCUTANEOUS
  Administered 2019-06-20: 5 [IU] via SUBCUTANEOUS
  Administered 2019-06-21: 4 [IU] via SUBCUTANEOUS
  Administered 2019-06-21: 11 [IU] via SUBCUTANEOUS
  Administered 2019-06-21: 15 [IU] via SUBCUTANEOUS
  Administered 2019-06-22: 3 [IU] via SUBCUTANEOUS
  Administered 2019-06-22: 11 [IU] via SUBCUTANEOUS
  Administered 2019-06-22 – 2019-06-23 (×2): 15 [IU] via SUBCUTANEOUS
  Administered 2019-06-23: 3 [IU] via SUBCUTANEOUS
  Administered 2019-06-23: 4 [IU] via SUBCUTANEOUS
  Administered 2019-06-24: 7 [IU] via SUBCUTANEOUS
  Administered 2019-06-24: 11 [IU] via SUBCUTANEOUS
  Administered 2019-06-24: 4 [IU] via SUBCUTANEOUS

## 2019-06-18 MED ORDER — PREDNISONE 50 MG PO TABS: 50.0000 mg | ORAL_TABLET | Freq: Every day | ORAL | Status: DC

## 2019-06-18 MED ORDER — FLUCONAZOLE 100 MG PO TABS: 200.0000 mg | ORAL_TABLET | Freq: Every day | ORAL | Status: DC

## 2019-06-18 MED ORDER — FLUCONAZOLE 100 MG PO TABS
400.0000 mg | ORAL_TABLET | Freq: Once | ORAL | Status: AC
  Administered 2019-06-18: 11:00:00 400 mg via ORAL
  Filled 2019-06-18: qty 4

## 2019-06-18 MED ORDER — PREDNISONE 20 MG PO TABS: 20.0000 mg | ORAL_TABLET | Freq: Every day | ORAL | Status: DC

## 2019-06-18 MED ORDER — NORTRIPTYLINE HCL 10 MG PO CAPS: 10.0000 mg | ORAL_CAPSULE | Freq: Every day | ORAL | Status: DC

## 2019-06-18 MED ORDER — POTASSIUM CHLORIDE CRYS ER 20 MEQ PO TBCR
40.0000 meq | EXTENDED_RELEASE_TABLET | Freq: Once | ORAL | Status: AC
  Administered 2019-06-18: 11:00:00 40 meq via ORAL
  Filled 2019-06-18: qty 2

## 2019-06-18 MED ORDER — PREDNISONE 20 MG PO TABS: 30.0000 mg | ORAL_TABLET | Freq: Every day | ORAL | Status: DC

## 2019-06-18 MED ORDER — PREDNISONE 50 MG PO TABS: 60.0000 mg | ORAL_TABLET | Freq: Every day | ORAL | Status: DC

## 2019-06-18 MED ORDER — PREDNISONE 10 MG PO TABS: 10.0000 mg | ORAL_TABLET | Freq: Every day | ORAL | Status: DC

## 2019-06-18 MED ORDER — NORTRIPTYLINE HCL 10 MG PO CAPS
20.0000 mg | ORAL_CAPSULE | Freq: Every day | ORAL | Status: DC
  Administered 2019-06-18 – 2019-06-24 (×7): 20 mg via ORAL
  Filled 2019-06-18 (×8): qty 2

## 2019-06-18 MED ORDER — SODIUM CHLORIDE 0.9 % IV SOLN: INTRAVENOUS | Status: DC

## 2019-06-18 MED ORDER — PREDNISONE 50 MG PO TABS
80.0000 mg | ORAL_TABLET | Freq: Every day | ORAL | Status: DC
  Administered 2019-06-19 – 2019-06-20 (×2): 80 mg via ORAL
  Filled 2019-06-18 (×2): qty 1

## 2019-06-18 MED ORDER — PREDNISONE 50 MG PO TABS: 70.0000 mg | ORAL_TABLET | Freq: Every day | ORAL | Status: DC

## 2019-06-18 MED ORDER — CEPHALEXIN 500 MG PO CAPS
500.0000 mg | ORAL_CAPSULE | Freq: Four times a day (QID) | ORAL | Status: DC
  Administered 2019-06-18 – 2019-06-20 (×8): 500 mg via ORAL
  Filled 2019-06-18 (×7): qty 1

## 2019-06-18 MED ORDER — TIZANIDINE HCL 2 MG PO TABS
2.0000 mg | ORAL_TABLET | Freq: Four times a day (QID) | ORAL | Status: DC
  Administered 2019-06-18 – 2019-06-22 (×15): 2 mg via ORAL
  Filled 2019-06-18 (×16): qty 1

## 2019-06-18 MED ORDER — FLUCONAZOLE 100 MG PO TABS
200.0000 mg | ORAL_TABLET | Freq: Every day | ORAL | Status: DC
  Administered 2019-06-19 – 2019-06-25 (×7): 200 mg via ORAL
  Filled 2019-06-18 (×7): qty 2

## 2019-06-18 MED ORDER — PREDNISONE 5 MG PO TABS: 5.0000 mg | ORAL_TABLET | Freq: Every day | ORAL | Status: DC

## 2019-06-18 MED ORDER — INSULIN ASPART 100 UNIT/ML ~~LOC~~ SOLN
15.0000 [IU] | Freq: Once | SUBCUTANEOUS | Status: AC
  Administered 2019-06-18: 15 [IU] via SUBCUTANEOUS

## 2019-06-18 MED ORDER — IOHEXOL 300 MG/ML  SOLN
100.0000 mL | Freq: Once | INTRAMUSCULAR | Status: AC | PRN
  Administered 2019-06-18: 75 mL via INTRAVENOUS

## 2019-06-18 NOTE — Progress Notes (Signed)
CRITICAL VALUE ALERT  Critical Value:  CBG 418  Date & Time Notied:  06/18/2019 at 1817  Provider Notified: Maia Breslow, MD paged  Orders Received/Actions taken: See new orders

## 2019-06-18 NOTE — Plan of Care (Signed)
  Problem: Education: Goal: Knowledge of General Education information will improve Description: Including pain rating scale, medication(s)/side effects and non-pharmacologic comfort measures Outcome: Progressing   Problem: Health Behavior/Discharge Planning: Goal: Ability to manage health-related needs will improve Outcome: Progressing   Problem: Clinical Measurements: Goal: Ability to maintain clinical measurements within normal limits will improve Outcome: Progressing Goal: Respiratory complications will improve Outcome: Progressing   Problem: Activity: Goal: Risk for activity intolerance will decrease Outcome: Progressing   Problem: Nutrition: Goal: Adequate nutrition will be maintained Outcome: Progressing   Problem: Elimination: Goal: Will not experience complications related to urinary retention Outcome: Progressing   Problem: Pain Managment: Goal: General experience of comfort will improve Outcome: Progressing   Problem: Safety: Goal: Ability to remain free from injury will improve Outcome: Progressing   Problem: Skin Integrity: Goal: Risk for impaired skin integrity will decrease Outcome: Progressing

## 2019-06-18 NOTE — Progress Notes (Addendum)
Family Medicine Teaching Service Daily Progress Note Intern Pager: [phone]  Patient name: [name] Medical record number: [id number] Date of birth: 07/12/1945 Age: 73 y.o. Gender: female  Primary Care Provider: Leonard Downing, MD Consultants: CONSULT FOR UNASSIGNED MEDICAL ADMISSION Code Status: Full Code   Pt Overview and Major Events to Date:  Hospital Day: 2 06/17/2019: admitted for Weakness 06/17/19: LE extremity DVT US negative, CXR w/ low lung volumes, Neck US w/o abnormality  06/17/19: 1g CTX x 1, Keflex 500 mg q 12 hours (12/30- )   Assessment and Plan: [name] is a 73 y.o. female who presented w/ weakness.  PMHx s/f obesity, hyperglycemia 2/2 prednisone, chronic headaches/migraines, HLD, C8 radiculopathy, bladder and bowel incontinence, OSA  # Generalized Weakness Patient reports that weakness has been present since April.  She tells other authors that her weakness has been present for 2 weeks and 1 day.  Patient also reported that she has been on prednisone since April.  After conversation with her primary care physician, Dr. Arelia Sneddon, she has been on prednisone since March 31, 2019.  Patient is overall a poor historian and cannot provide all of her medications.  Patient believes that her weakness is due to attempting to taper off of prednisone.  Patient reports not wanting to be on the prednisone but has continued since a taper down to 100 mg caused weakness and return of headache (empiric treatment for temporal arteritis as patient was not able to get in for biopsy within 2 weeks of starting therapy.  Patient improved with prednisone for treatment with plan to taper).  I think it is less likely that her weakness is from tapering prednisone as she does not have any other symptoms of adrenal insufficiency.  I would expect for her to have other significant symptoms such as hypotension given the extent of her subjective weakness. Patient has multiple causes for  generalized weakness.  First, she is on several medications.  A thorough medication reconciliation has been started by calling pharmacies.  She is on multiple medications that can cause weakness at normal doses and with withdrawals, including tizanidine (last filled 11/9 #90), diazepam (02/2019 #90), percocet 5-325 (September #30 @ [facility], October #60 @ [facility]), nortriptyline (03/2019 #180 10mg  tabs from Dr. Krista Blue, and 75 mg tabs from Dr. Arelia Sneddon), fioricet (02/2019 #30 PRN).  Patient also has history of spinal stenosis according to her PCP, Dr. Arelia Sneddon.  In addition to bladder and bowel incontinence, and there is concern for spinal injury in setting of chronic lumbar pain (previously seen with pain specialist).  Will obtain MRI of thoracic and lumbar.  Obtaining thoracic level MRI given her neck mass, which could be concerning for malignancy. Lastly, can consider patient's respiratory alkalosis with metabolic alkalosis.   Spine MRI ( if negative, not necessary ) thoracic and lumbar w/ neck mass   OT/PT consulted, fall precautions,   Decrease prednisone -taper by 10 mg every 5 days  #Polypharmacy Details as above.  I am specifically concerned about nortriptyline as she has 2 very different prescriptions for this medication.  Additionally, husband does note that he does not believe she has been taking the nortriptyline daily as the headaches improved with prednisone use.  And he does report all of this weakness started around this time.  Would not expect a withdrawal syndrome to last several weeks, but should still consider as cause of weakness.  Spoke to husband on the phone this morning and he is willing to bring all  of her pill bottles to the hospital for pharmacy to go through.  He reports that he will be able to drop them off at the front desk around 2 to 2:30 PM today   Continue prednisone taper, pharmacy has already dose to taper through 07/30/2019  Unclear what dose of norytriptlyine patient is on at  home. Want to avoid withdrawal- thus continue with 20 mg nortriptyline.   Same for tizanidine ^^   Follow up UDS  #Neck Mass Patient presented with anterior neck mass.  Her thyroid numbers were within normal limits on admission.  There is a palpable mass about 3 cm in diameter on her anterior neck that sits just under subcu.  It is mobile.  Patient reports that this has been there since her cervical surgery which was an anterior approach.  Can consider surgical scarring but also should rule out thyroid cancer.  Malignancy would explain the elevation in LDH and could be a cause of weakness.  Ultrasound was performed yesterday but was not specific for thyroid.  Will obtain CT with contrast of the neck.  Patient with low kidney function and thus will start fluids after CT  Follow-up neck CT  A.m. BMP for renal function  Maintenance IV fluids, 125 mL/h x 24 hours after CT  Monitor blood pressure  Monitor for edema, as patient has had lower extremity swelling in the past  #Tachycardia  # Hypertroponinemia  #Elevated BNP BNP144.8 Likely due to CKD/AKI as well as persistent tachycardia. EKG with sinus tachycardia. Normal echocardiogram in August 2020. Can consider starting low dose beta blocker. Concern about withdrawal from medications as well.  Patient denies any chest pain, shortness of breath.  Can consider starting low-dose beta-blocker if worsens or patient becomes symptomatic.  Continue to monitor on telemetry  Monitor for chest pain  Follow-up medication reconciliation  #Dysphagia  #Thrush # Long Term coritcosteroid use #GERD #Hx of Barrets In setting of long-term steroid use, and prescription for thrush treatment sent to pharmacy 2 days ago, her dysphagia seems most consistent with a candidal esophagitis.  Spoke to GI who suggested empiric treatment with oral fluconazole and if no improvement in 3 to 5 days, reconsult for endoscopy.  If she continues to improve with the  fluconazole, we should continue for total course of 21 days.  Speech was consulted for dysphagia and suggested full liquid diet and advance per patient's comfort.  Loading dose 400 mg fluconazole, followed by 200 mg daily x20 days  Full liquid diet, advance per patient's comfort   # Cellulitis  Patient presented with erythema and swelling of the left lower extremity and DVT ultrasound performed in ED and negative for DVT.  Patient was given ceftriaxone 1 g x 1 and has responded well.  Her erythema is much improved today.  She will continue Keflex 500 mg every 12 hours for 4 days, total treatment of 5 days.   Continue keflex (06/18/19-06/21/19)  #Elevated LDH # Lactic Acidosis  # Chronic Primary Respiratory Alkalosis with Secondary Metabolic Alkalosis ABG and most recent BMP consistent with a primary respiratory alkalosis and secondary metabolic alkalosis. Patient is not in any acute respiratory distress, thus no further work up necessary. It is possible that alkalemic state could be cause of lacatemia. Though, should continue to monitor and evaluate. Other differential of LDH elevation: alcohol, cancer, cirrhosis, folate deficiency, anemia, homelysis, liver disease, muscle disease, MI, PCP pna, Stroke, Tissue ischemia, TTP, Vit B12 deficienty, Hepatotoxic drugs   #Bladder and Bowel Incontinence  Hx of spinal stenosis, per Dr. Arelia Sneddon.  Obtaining MRI as above.  MRI as above, if negative, defer to PCP o/p   # Hyperglycemia  Likely due to high-dose prednisone.  Her A1c was 12.5.  We have started patient on sliding scale insulin 3 times daily WC and nightly.  We will continue to monitor patient's blood glucose today and likely start a long-acting insulin tomorrow.  Ideally, given patient's hyperglycemia is due to prednisone, would like to discharge her with oral medication.  Patient is on Metformin 500 mg twice daily at home which was started earlier this year.  Currently, patient has a lactic  acidosis and we do not want to start Metformin at this time.  Other options were contemplated with pharmacy at rounds today.  Will continue to research for best oral medications and monitor sugars and insulin requirements.  Continue moderate sliding scale 3 times a day with meals  Continue nightly scale  # HLD Most recent lipid profile in August 2020 shows normal levels.  At home, taking simvastatin 20 mg    Continue home medication  #Chronic Left Lower Leg Edema #S/p Right Total Knee Was on lasix 80 mg, 1-2 tabs every 8 hours as needed for lower extremity edema.   Unsure of how often patient is taking Lasix at home.  As she has no shortness of breath and given her renal function, will hold Lasix.  Hold lasix   # Elevated ALP 181 and 79 in 01/2019.  Elevated values could be due to liver disease, hyperthryoidism, metastatic bone cancer, MI. Obtaining imaging as above.   If negative, defer to PCP for further workup.   #Hypokalemia #Hypophosphatemia   Check phos in the morning.   Replete K with 40 mg   AM BMP, Phos   #AKI on CKD  Improved from August hospitalization  Avoid nephrotoxic agents  Monitor with daily BMP  Fluids after CT with contrast  #Sleep Apnea Patient compliant with CPAP at home  CPAP QHS  # NAFLD # ALT/AST #Elevated GGT #s/p cholecystecomty  Ultrasound reveals hepatic steatosis.  Per Dr. Arelia Sneddon, patient has had elevated liver enzymes in the past.  This seems to be a chronic problem.  Urine alcohol was negative.  I do not not think that this is causing any of her acute issues.  Can continue work-up outpatient.  Follow-up outpatient  #Chronic headaches  Follows at [facility]. Currently taking Nortriptyline 20 mg QHS (Dr. Krista Blue started a year ago) had an out of body experience), Rizatriptan PRN. Improved with prednisone in October.   Monitor  Rx PRN  # Chronic Neck Pain  #Hx of anterior cervical Decompression C3-C7  #Cervical Radiculopathy at C8 Had a  hospitalization in August 2020 for likely cause of her unilateral head and neck pain.  Nothing to do while inpatient  Continue to monitor  #Depression?  Patient has Celexa on her medication list, however, has not been filled since December 22, 2018.  Will not continue during hospitalization.  #Sjorgen's?  Per Dr. Arelia Sneddon, patient was diagnosed by Dr. Patrecia Pour with [place] Follow-up outpatient  #Hx of radiation Therapy 2005 #Rt Breast Cancer   #FEN/GI:  . Fluids: start after CT  . Electrolytes: replete as above  . Nutrition: Full liquid, ADAT   Access:  VTE prophylaxis: Lovenox 40 (CrCl>30)  Disposition: TBD. When medically stable.   Subjective:  NAEO. Doing well this morning. Still feels weak all over.   Objective: Temp:  [97.6 F (36.4 C)-98.9 F (37.2 C)]  98.7 F (37.1 C) (12/30 0455) Pulse Rate:  [109-127] 109 (12/30 0455) Cardiac Rhythm: Sinus tachycardia (12/30 0100) Resp:  [15-21] 16 (12/30 0455) BP: (96-126)/(53-84) 112/64 (12/30 0455) SpO2:  [91 %-99 %] 91 % (12/30 0455) Weight:  [112.5 kg] 112.5 kg (12/30 0047) Intake/Output      12/29 0701 - 12/30 0700   P.O. 0   IV Piggyback 600   Total Intake(mL/kg) 600 (5.3)   Urine (mL/kg/hr) 200   Total Output 200   Net +400           Physical Exam: General: NAD, non-toxic, well-appearing, sitting comfortably in bed. Able to sit herself up in bed   HEENT: [place]/AT. PERRLA. EOMI. Mucosal erythema with white spots on oropharynx and tongue Cardiovascular: RRR, normal S1, S2. B/L 2+ RP. 1+ BLEE Respiratory: CTAB. No IWOB.  Abdomen: Obese. + BS. NT, ND, soft to palpation.  Extremities: Warm and well perfused. Moving spontaneously. Left dorsal foot with erythema, much improved, isolated to dorsal surface and without distinct borders. Bandages on left heel for pressure ulcer ppx.  Integumentary: Otherwise, no obvious rashes, lesions, trauma on general exam. Neuro: A & O x4. CN grossly intact. No FND  Laboratory: I have  personally read and reviewed all labs and imaging studies.  CBC: Recent Labs  Lab 06/17/19 1248 06/17/19 1253 06/17/19 1723 06/18/19 0147  WBC 15.6*  --   --  12.5*  NEUTROABS 12.8*  --   --   --   HGB 13.8 13.9 11.6* 13.3  HCT 42.2 41.0 34.0* 39.4  MCV 97.2  --   --  95.9  PLT 236  --   --  269   CMP: Recent Labs  Lab 06/17/19 1248 06/17/19 1253 06/17/19 1723 06/18/19 0147  NA 134* 132* 132* 135  K 4.1 3.9 3.3* 3.3*  CL 91*  --   --  92*  CO2 25  --   --  27  GLUCOSE 321*  --   --  221*  BUN 23  --   --  16  CREATININE 0.89  --   --  1.04*  CALCIUM 9.7  --   --  9.3  MG 2.1  --   --   --   PHOS 2.4*  --   --   --   ALBUMIN 2.7*  --   --  2.6*   CBG: Recent Labs  Lab 06/17/19 1036 06/17/19 1728 06/17/19 2237  GLUCAP 308* 255* 243*   Micro: Covid Negative  Imaging/Diagnostic Tests: US SOFT TISSUE HEAD & NECK (NON-THYROID)  Result Date: 06/17/2019 CLINICAL DATA:  Swelling of the neck. EXAM: ULTRASOUND OF HEAD/NECK SOFT TISSUES TECHNIQUE: Ultrasound examination of the head and neck soft tissues was performed in the area of clinical concern. COMPARISON:  None. FINDINGS: In the patient's area of concern in the right neck, there is no significant sonographic abnormality. There is no drainable fluid collection. There is no mass. IMPRESSION: No sonographic abnormality corresponding to the patient's area of concern. If there is persistent clinical concern, follow-up with a contrast-enhanced CT is recommended. Electronically Signed   By: Constance Holster M.D.   On: 06/17/2019 20:25   DG Chest Port 1 View  Result Date: 06/17/2019 CLINICAL DATA:  73 year old female with a history of weakness and short of breath. EXAM: PORTABLE CHEST 1 VIEW COMPARISON:  01/30/2019 FINDINGS: Cardiomediastinal silhouette unchanged in size and contour. No evidence of central vascular congestion. No interlobular septal thickening. No pneumothorax or pleural effusion.  No confluent airspace  disease. Low lung volumes. Surgical changes of the cervical region. IMPRESSION: Low lung volumes without evidence of acute cardiopulmonary disease. Electronically Signed   By: Corrie Mckusick D.O.   On: 06/17/2019 12:32   VAS [place] LOWER EXTREMITY VENOUS (DVT) ([facility] 7a-7p)  Result Date: 06/17/2019  Lower Venous Study Indications: Swelling, Erythema, and Pain.  Risk Factors: None identified. Limitations: Body habitus, poor ultrasound/tissue interface and patient pain tolerance. Comparison Study: No prior studies. Performing Technologist: Oliver Hum RVT  Examination Guidelines: A complete evaluation includes B-mode imaging, spectral Doppler, color Doppler, and power Doppler as needed of all accessible portions of each vessel. Bilateral testing is considered an integral part of a complete examination. Limited examinations for reoccurring indications may be performed as noted.  +-----+---------------+---------+-----------+----------+--------------+ RIGHTCompressibilityPhasicitySpontaneityPropertiesThrombus Aging +-----+---------------+---------+-----------+----------+--------------+ CFV  Full           Yes      Yes                                 +-----+---------------+---------+-----------+----------+--------------+   +---------+---------------+---------+-----------+----------+--------------+ LEFT     CompressibilityPhasicitySpontaneityPropertiesThrombus Aging +---------+---------------+---------+-----------+----------+--------------+ CFV      Full           Yes      Yes                                 +---------+---------------+---------+-----------+----------+--------------+ SFJ      Full                                                        +---------+---------------+---------+-----------+----------+--------------+ FV Prox  Full                                                        +---------+---------------+---------+-----------+----------+--------------+ FV  Mid   Full                                                        +---------+---------------+---------+-----------+----------+--------------+ FV DistalFull                                                        +---------+---------------+---------+-----------+----------+--------------+ PFV      Full                                                        +---------+---------------+---------+-----------+----------+--------------+ POP      Full           Yes      Yes                                 +---------+---------------+---------+-----------+----------+--------------+  PTV      Full                                                        +---------+---------------+---------+-----------+----------+--------------+ PERO     Full                                                        +---------+---------------+---------+-----------+----------+--------------+     Summary: Right: No evidence of common femoral vein obstruction. Left: There is no evidence of deep vein thrombosis in the lower extremity. However, portions of this examination were limited- see technologist comments above. No cystic structure found in the popliteal fossa.  *See table(s) above for measurements and observations. Electronically signed by Servando Snare MD on 06/17/2019 at 3:47:01 PM.    Final    US Abdomen Limited RUQ  Result Date: 06/17/2019 CLINICAL DATA:  Elevated LFTs EXAM: ULTRASOUND ABDOMEN LIMITED RIGHT UPPER QUADRANT COMPARISON:  CT dated January 05, 2006 FINDINGS: Gallbladder: Surgically absent Common bile duct: Diameter: 6 mm Liver: Diffuse increased echogenicity with slightly heterogeneous liver. Appearance typically secondary to fatty infiltration. Fibrosis secondary consideration. No secondary findings of cirrhosis noted. No focal hepatic lesion or intrahepatic biliary duct dilatation. Portal vein is patent on color Doppler imaging with normal direction of blood flow towards the liver. Other: .  IMPRESSION: 1. No acute abnormality. The patient is status post cholecystectomy. 2. Hepatic steatosis. Electronically Signed   By: Constance Holster M.D.   On: 06/17/2019 20:23    EKG Interpretation  Date/Time:  Tuesday June 17 2019 10:31:35 EST Ventricular Rate:  124 PR Interval:    QRS Duration: 71 QT Interval:  289 QTC Calculation: 415 R Axis:   34 Text Interpretation: Sinus tachycardia Probable left atrial enlargement When compared with ECG of 01/30/2019, Nonspecific ST abnormality is no longer present HEART RATE has increased Confirmed by Delora Fuel ([id number]) on 06/17/2019 11:43:37 PM       Wilber Oliphant, MD 06/18/2019, 6:36 AM PGY-2, [place] Intern pager: [phone], text pages welcome

## 2019-06-18 NOTE — Evaluation (Signed)
Clinical/Bedside Swallow Evaluation Patient Details  Name: [name] MRN: [id number] Date of Birth: [birth year]  Today's Date: 06/18/2019 Time: SLP Start Time (ACUTE ONLY): [id number] SLP Stop Time (ACUTE ONLY): 0915 SLP Time Calculation (min) (ACUTE ONLY): 20 min  Past Medical History:  Past Medical History:  Diagnosis Date  . ADD (attention deficit disorder)   . Anxiety   . Arthritis    os  . Diabetes mellitus without complication ([place])   . GERD (gastroesophageal reflux disease)    barretts  . Headache   . High cholesterol   . Neck pain   . Obesity   . Personal history of radiation therapy 2005  . Sleep apnea    cpap   last sleep study> 3 yrs  . Swelling of both ankles    Past Surgical History:  Past Surgical History:  Procedure Laterality Date  . ABDOMINAL HYSTERECTOMY    . ANTERIOR CERVICAL DECOMP/DISCECTOMY FUSION N/A 02/15/2016   Procedure: Cervical three four-Cervical five-six, Cervical six-seven Anterior cervical decompression/diskectomy/fusion;  Surgeon: Kristeen Miss, MD;  Location: [facility];  Service: Neurosurgery;  Laterality: N/A;  . bicept surgery    . BREAST LUMPECTOMY    . CHOLECYSTECTOMY    . JOINT REPLACEMENT    . PAROTID GLAND TUMOR EXCISION    . TONSILLECTOMY    . TOTAL KNEE ARTHROPLASTY  07/19/2012   Procedure: TOTAL KNEE ARTHROPLASTY;  Surgeon: Yvette Rack., MD;  Location: [place];  Service: Orthopedics;  Laterality: Right;  right total knee arthroplasty  . TUBAL LIGATION     HPI:  73 Y/O F with PMX of OSA, Obesity, HLD, DM2, OA, ADD, Cervical radiculopathy, and myelopathy presented to the hospital with generalized weakness. She endorsed difficulty swallowing in the setting of thrush.     Assessment / Plan / Recommendation Clinical Impression   Pt presents with mild intermittent immediate coughing with consecutive cup sips of thin liquids.  No other overt s/s of aspiration were evident with solids or purees.  I suspect coughing to be more  related to irritation rather than significant airway compromise given her current complaints of dryness and soreness both orally and pharyngeally.  For now, I would suggest initiating a full liquids diet and pt's diet may be advanced per pt's comfort.  I suspect once her symptoms of thrush are resolved she will be able to resume her previous diet.  No further ST needs are indicated at this time.        Aspiration Risk  Mild aspiration risk    Diet Recommendation Other (Comment)(full liquids diet )   Liquid Administration via: Cup;Straw Medication Administration: Other (Comment)(as tolerated) Supervision: Patient able to self feed Compensations: Slow rate;Small sips/bites Postural Changes: Seated upright at 90 degrees;Remain upright for at least 30 minutes after po intake    Other  Recommendations Oral Care Recommendations: Oral care BID   Follow up Recommendations None      Frequency and Duration            Prognosis        Swallow Study   General HPI: 73 Y/O F with PMX of OSA, Obesity, HLD, DM2, OA, ADD, Cervical radiculopathy, and myelopathy presented to the hospital with generalized weakness. She endorsed difficulty swallowing in the setting of thrush.   Type of Study: Bedside Swallow Evaluation Previous Swallow Assessment: none on record Diet Prior to this Study: NPO Temperature Spikes Noted: No Respiratory Status: Room air History of Recent Intubation: No Behavior/Cognition: Alert;Cooperative Oral  Cavity Assessment: Dry Oral Cavity - Dentition: Adequate natural dentition Vision: Functional for self-feeding Self-Feeding Abilities: Able to feed self Patient Positioning: Upright in bed Baseline Vocal Quality: Hoarse    Oral/Motor/Sensory Function Overall Oral Motor/Sensory Function: Within functional limits   Ice Chips     Thin Liquid Thin Liquid: Impaired Presentation: Straw Pharyngeal  Phase Impairments: Cough - Immediate    Nectar Thick     Honey Thick      Puree Puree: Within functional limits   Solid     Solid: Impaired Oral Phase Impairments: Impaired mastication Oral Phase Functional Implications: Prolonged oral transit      [name] L 06/18/2019,9:23 AM

## 2019-06-18 NOTE — Care Management Obs Status (Signed)
[place] NOTIFICATION   Patient Details  Name: YARISSA PROVENCE MRN: [id number] Date of Birth: [birth year]   Medicare Observation Status Notification Given:  Yes    Marilu Favre, RN 06/18/2019, 8:53 AM

## 2019-06-18 NOTE — Progress Notes (Addendum)
Note: I will cosign resident's note once it is completed.  She still endorses weakness. Otherwise, no new concerns.  HEENT: Full firm neck mass. Non-tender. Scattered whitish lesions on the tongue posteriorly all the way in the back of her tongue. Otherwise, her tongue is smooth and red.  Neuro: She is awake and alert, oriented x 3. No facial asymmetry. Gait was not assessed. CN II-XII intact, no sensory loss. Power about 3-4/5 globally mostly due to a poor effort, ++ biceps DTR. Babinski reflex normal/downgoing without fanning. Heart: S1 S2 normal. No murmur, RRR. Lungs: CTA B/L. Abd: Soft, obese, NT/ND, BS+, and normal. Ext: Improved edema and erythema of her left LL. Improved edema of her RLL.  A/P: Generalized weakness Likely multifactorial: Deconditioning from morbid obesity, chronic steroid use, endocrine abnormality (DM, Chushingoid, normal TSH). Still no focal neurologic deficit. PT/OT eval pending. Given associated urine incontinence and previous hx chronic back pain with spinal stenosis, I will support obtaining her thoracic and lumbar spine MRI. If abnormal, we will consult Neuro.   Hyperglycemia: Likely secondary to chronic steroid use.  SSI and monitor CBG closely. Likely need oral regimen vs. Insulin on discharge given worsened A1C from 6 to 12.  Abnormal ABG: Improved. Respiratory status remains stable.  Sinus tachycardia: ?? Withdrawal from Tizanidine Restart back at a lower dose. Consider beta-blocker if this persists. Monitor cardiac activities on the telemetry.  Lactic acidosis: She does not have a sepsis picture. LA improved. Monitor.  Left LL swelling: Cellulitis. Duplex US negative for DVT. Swelling and erythema improved with Cefepime. Transition to oral Keflex today. Monitor closely.  Elevated liver enzymes: the US shows hepatosteatosis. Outpatient weight loss management is recommended. Avoid nephrotoxic agents.  Dysphagia/Oropharyngeal candidiasis:  Antifungal. Appreciate the speech eval. Consult GI this morning for EGD.  Urine incontinence: Likely chronic. Monitor I&Os. PCP follow-up.  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 Plan on tapering her prednisone slowly.  Electrolyte abnormality: Mild hyponatremia likely due to hyperglycemia. It is improved with glucose control. Hypokalemia: Replete and repeat. Mild hypophosphatemia (Chronic/recurrent): Repeat in the AM and replete if it drops further.  Neck mass: TSH normal. Likely benign. Soft tissue U/S of the neck was negative. It excluded the thyroid gland report. Resident to discuss read with the radiologist and check if we need to order Thyroid US vs. CT neck.  Mild Acute Kidney Disease: Creatinine increased to 1.04. Monitor closely with oral hydration. Avoid nephrotoxic agents.   Addendum: UA ordered from the ED is abnormal. She did not endorse urinary symptoms. We will go ahead and obtain culture and f/u with her regarding urinary symptoms. If positive, consider treatment per sensitivity. She is already on oral A/B which I hope will cover UTI if she has one.  Also, signed off neck/thyroid US report to [employer] to f/u on.

## 2019-06-18 NOTE — TOC Initial Note (Signed)
Transition of Care [facility]) - Initial/Assessment Note    Patient Details  Name: [name] MRN: [id number] Date of Birth: [birth year]  Transition of Care [facility] CM/SW Contact:    Marilu Favre, RN Phone Number: 06/18/2019, 8:54 AM  Clinical Narrative:                  Patient from home with husband. Has walker and bedside commode. PCP is Dr Arelia Sneddon . PT/OT evaluations ordered. Await recommendations.        Patient Goals and CMS Choice Patient states their goals for this hospitalization and ongoing recovery are:: to get better CMS Medicare.gov Compare Post Acute Care list provided to:: Patient    Expected Discharge Plan and Services         Living arrangements for the past 2 months: Single Family Home                                      Prior Living Arrangements/Services Living arrangements for the past 2 months: Single Family Home Lives with:: Spouse   Do you feel safe going back to the place where you live?: Yes          Current home services: DME    Activities of Daily Living      Permission Sought/Granted   Permission granted to share information with : No              Emotional Assessment Appearance:: Appears stated age Attitude/Demeanor/Rapport: Engaged Affect (typically observed): Accepting Orientation: : Oriented to Self, Oriented to Place, Oriented to  Time, Oriented to Situation Alcohol / Substance Use: Not Applicable Psych Involvement: No (comment)  Admission diagnosis:  Weakness [R53.1] Elevated LFTs [R79.89] Mass in neck [R22.1] Patient Active Problem List   Diagnosis Date Noted  . Weakness 06/17/2019  . Hyperglycemia   . Hypophosphatemia   . Cellulitis of left lower extremity   . Elevated LFTs   . Mass in neck   . Cervical radiculopathy at C8 01/31/2019  . Right arm numbness 01/30/2019  . Chronic migraine 12/06/2017  . Chronic intractable headache 10/23/2017  . Cervical myelopathy with cervical radiculopathy  02/15/2016   PCP:  Leonard Downing, MD Pharmacy:   [place], [place] - [address] [address] [facility] [address] [place]  [address] Phone: [phone] Fax: [phone]     Social Determinants of Health (SDOH) Interventions    Readmission Risk Interventions No flowsheet data found.

## 2019-06-18 NOTE — ED Notes (Signed)
Report given to 6 N RN. All questions answered 

## 2019-06-18 NOTE — Progress Notes (Signed)
Pt changed her mind and decided not to wear CPAP tonight.

## 2019-06-18 NOTE — Progress Notes (Signed)
Inpatient Diabetes Program Recommendations  AACE/ADA: New Consensus Statement on Inpatient Glycemic Control (2015)  Target Ranges:  Prepandial:   less than 140 mg/dL      Peak postprandial:   less than 180 mg/dL (1-2 hours)      Critically ill patients:  140 - 180 mg/dL   Lab Results  Component Value Date   GLUCAP 367 (H) 06/18/2019   HGBA1C 12.5 (H) 06/17/2019    Review of Glycemic Control Results for [name], [name]" (MRN [id number]) as of 06/18/2019 13:00  Ref. Range 06/17/2019 10:36 06/17/2019 17:28 06/17/2019 22:37 06/18/2019 07:51 06/18/2019 12:12  Glucose-Capillary Latest Ref Range: 70 - 99 mg/dL 308 (H) 255 (H) 243 (H) 240 (H) 367 (H)   Diabetes history: DM2 Outpatient Diabetes medications: None Current orders for Inpatient glycemic control: Novolog moderate tid + hs  Inpatient Diabetes Program Recommendations:    While on steroids: -Add Lantus 22 units daily (0.2 units/kg x 112.5 kg) A1c increased from 6.1 on 01/31/19 to 12.5 06/17/19 after being on steroids for the past several weeks.  Thank you, Nani Gasser. Aariah Godette, RN, MSN, CDE  Diabetes Coordinator Inpatient Glycemic Control Team Team Pager [phone] (8am-5pm) 06/18/2019 1:11 PM

## 2019-06-18 NOTE — Progress Notes (Signed)
Admitted to room 6n17 from ED at this time

## 2019-06-18 NOTE — Progress Notes (Signed)
CRITICAL VALUE ALERT  Critical Value:  CBG 540  Date & Time Notied:  06/18/2019 at 1658  Provider Notified: Maia Breslow, MD paged  Orders Received/Actions taken: See new orders

## 2019-06-18 NOTE — Evaluation (Signed)
Occupational Therapy Evaluation Patient Details Name: [name] MRN: [id number] DOB: [birth year] Today's Date: 06/18/2019    History of Present Illness 73 yo female s/p fall with LLE Cellulitis with tachycardia PMH ADD, anxiety, arthritis, DM, obesity, hx of radiation, sleep apnea, ACDF 2017, breast lumpectomy, R TKA 2014   Clinical Impression   PT admitted with LLE and tachcardia. Pt currently with functional limitiations due to the deficits listed below (see OT problem list). Pt currently total +2 mod (A) to stand pivot to chair. Pt requires (A) for all LB adls.  Pt will benefit from skilled OT to increase their independence and safety with adls and balance to allow discharge SNF. Pt noted to have pending MRI scan due to incontinence noted in the chart. Pt requires dependence due to incontinence.      Follow Up Recommendations  SNF    Equipment Recommendations  3 in 1 bedside commode;Wheelchair (measurements OT);Wheelchair cushion (measurements OT)    Recommendations for Other Services       Precautions / Restrictions Precautions Precautions: Fall Restrictions Weight Bearing Restrictions: No      Mobility Bed Mobility Overal bed mobility: Needs Assistance Bed Mobility: Supine to Sit;Rolling Rolling: Mod assist   Supine to sit: Mod assist     General bed mobility comments: (A) to bring bil Le to EOB , mod cues to sequence and mod (A) to elevate trunk from bed surface on R side  Transfers Overall transfer level: Needs assistance Equipment used: Rolling walker (2 wheeled) Transfers: Risk manager;Sit to/from Stand Sit to Stand: +2 physical assistance;Mod assist;From elevated surface Stand pivot transfers: +2 physical assistance;Mod assist       General transfer comment: heavy reliance on Rw and pushing down with BIL UE. pt fatigued with transfer and foward flexion with elbows on RW. pt needs cues to control descend to chair and holding onto RW instead  of reaching back    Balance Overall balance assessment: Needs assistance Sitting-balance support: Bilateral upper extremity supported;Feet supported Sitting balance-Leahy Scale: Good     Standing balance support: Bilateral upper extremity supported;During functional activity Standing balance-Leahy Scale: Poor                             ADL either performed or assessed with clinical judgement   ADL Overall ADL's : Needs assistance/impaired Eating/Feeding: Modified independent   Grooming: Wash/dry hands;Modified independent   Upper Body Bathing: Minimal assistance;Sitting   Lower Body Bathing: Maximal assistance;Sit to/from stand;+2 for physical assistance   Upper Body Dressing : Minimal assistance;Sitting   Lower Body Dressing: +2 for physical assistance;Maximal assistance;Sit to/from stand   Toilet Transfer: +2 for physical assistance;Maximal assistance Toilet Transfer Details (indicate cue type and reason): simulated eob to chair           General ADL Comments: pt agreeable to OOB to chair and expressed inability to see to call daughter. pt with siri activated on cell phone so that she can now call her via voice. pt demonstrates during session     Vision Baseline Vision/History: Wears glasses Wears Glasses: At all times Additional Comments: states i cant read with them or without. i have cataracts and i cant go get them fixed     Perception     Praxis      Pertinent Vitals/Pain Pain Assessment: No/denies pain     Hand Dominance Right   Extremity/Trunk Assessment Upper Extremity Assessment Upper Extremity Assessment: Generalized weakness  Lower Extremity Assessment Lower Extremity Assessment: Defer to PT evaluation;Generalized weakness   Cervical / Trunk Assessment Cervical / Trunk Assessment: Other exceptions(previous neck surgery)   Communication Communication Communication: No difficulties   Cognition Arousal/Alertness:  Awake/alert Behavior During Therapy: WFL for tasks assessed/performed Overall Cognitive Status: Within Functional Limits for tasks assessed                                     General Comments  pt without c/o and tolerates sitting     Exercises     Shoulder Instructions      Home Living Family/patient expects to be discharged to:: Private residence Living Arrangements: Spouse/significant other Available Help at Discharge: Family;Available 24 hours/day Type of Home: House Home Access: Stairs to enter [employer] of Steps: 1 Entrance Stairs-Rails: None Home Layout: One level     Bathroom Shower/Tub: Tub/shower unit;Door   [employer] Toilet: Standard     Home Equipment: Kasandra Knudsen - single point;Walker - 4 wheels;Hand held shower head;Shower seat;Bedside commode(shower seat has a back)   Additional Comments: owns two homes but mainly stays in personal home      Prior Functioning/Environment Level of Independence: Independent with assistive device(s)        Comments: uses rollator at baseline, normally wears a brief for incontinence.         OT Problem List: Decreased strength;Decreased activity tolerance;Impaired balance (sitting and/or standing);Decreased safety awareness;Decreased knowledge of use of DME or AE;Decreased knowledge of precautions;Obesity      OT Treatment/Interventions: Self-care/ADL training;Therapeutic exercise;Energy conservation;DME and/or AE instruction;Manual therapy;Therapeutic activities;Patient/family education;Balance training    OT Goals(Current goals can be found in the care plan section) Acute Rehab OT Goals Patient Stated Goal: to call my daughter OT Goal Formulation: With patient Time For Goal Achievement: 07/02/19 Potential to Achieve Goals: Good  OT Frequency: Min 2X/week   Barriers to D/C:            Co-evaluation PT/OT/SLP Co-Evaluation/Treatment: Yes Reason for Co-Treatment: Complexity of the patient's  impairments (multi-system involvement);Necessary to address cognition/behavior during functional activity;For patient/therapist safety;Other (comment)   OT goals addressed during session: ADL's and self-care;Strengthening/ROM;Proper use of Adaptive equipment and DME      AM-PAC OT "6 Clicks" Daily Activity     Outcome Measure Help from another person eating meals?: A Little Help from another person taking care of personal grooming?: A Little Help from another person toileting, which includes using toliet, bedpan, or urinal?: A Lot Help from another person bathing (including washing, rinsing, drying)?: A Lot Help from another person to put on and taking off regular upper body clothing?: A Little Help from another person to put on and taking off regular lower body clothing?: Total 6 Click Score: 14   End of Session Equipment Utilized During Treatment: Gait belt;Rolling walker Nurse Communication: Mobility status;Precautions  Activity Tolerance: Patient tolerated treatment well Patient left: in chair;with call bell/phone within reach;with chair alarm set  OT Visit Diagnosis: Unsteadiness on feet (R26.81);Muscle weakness (generalized) (M62.81)                Time: [id number] OT Time Calculation (min): 33 min Charges:  OT General Charges $OT Visit: 1 Visit OT Evaluation $OT Eval Moderate Complexity: 1 Mod   Brynn, OTR/L  Acute Rehabilitation Services Pager: [phone] Office: [phone] .   Jeri Modena 06/18/2019, 2:38 PM

## 2019-06-18 NOTE — Progress Notes (Addendum)
Troponin 98.. Critical alert sent to Dr. Ky Barban.

## 2019-06-18 NOTE — Evaluation (Signed)
Physical Therapy Evaluation Patient Details Name: [name] MRN: [id number] DOB: 1945-11-30 Today's Date: 06/18/2019   History of Present Illness  73 yo female s/p fall with LLE Cellulitis with tachycardia PMH ADD, anxiety, arthritis, DM, obesity, hx of radiation, sleep apnea, ACDF 2017, breast lumpectomy, R TKA 2014  Clinical Impression   Pt admitted with above diagnosis. Independent at home, using rollator RW for amb; Presents to PT with generalized weakness, decr functional mobility, and functional dependencies;  Pt currently with functional limitations due to the deficits listed below (see PT Problem List). Pt will benefit from skilled PT to increase their independence and safety with mobility to allow discharge to the venue listed below.       Follow Up Recommendations SNF    Equipment Recommendations  None recommended by PT    Recommendations for Other Services       Precautions / Restrictions Precautions Precautions: Fall Restrictions Weight Bearing Restrictions: No      Mobility  Bed Mobility Overal bed mobility: Needs Assistance Bed Mobility: Supine to Sit;Rolling Rolling: Mod assist   Supine to sit: Mod assist     General bed mobility comments: (A) to bring bil Le to EOB , mod cues to sequence and mod (A) to elevate trunk from bed surface on R side  Transfers Overall transfer level: Needs assistance Equipment used: Rolling walker (2 wheeled) Transfers: Risk manager;Sit to/from Stand Sit to Stand: +2 physical assistance;Mod assist;From elevated surface Stand pivot transfers: +2 physical assistance;Mod assist       General transfer comment: heavy reliance on Rw and pushing down with BIL UE. pt fatigued with transfer and foward flexion with elbows on RW. pt needs cues to control descend to chair and holding onto RW instead of reaching back  Ambulation/Gait                Stairs            Wheelchair Mobility    Modified  Rankin (Stroke Patients Only)       Balance Overall balance assessment: Needs assistance Sitting-balance support: Bilateral upper extremity supported;Feet supported Sitting balance-Leahy Scale: Good     Standing balance support: Bilateral upper extremity supported;During functional activity Standing balance-Leahy Scale: Poor                               Pertinent Vitals/Pain Pain Assessment: No/denies pain    Home Living Family/patient expects to be discharged to:: Private residence Living Arrangements: Spouse/significant other Available Help at Discharge: Family;Available 24 hours/day Type of Home: House Home Access: Stairs to enter Entrance Stairs-Rails: None Entrance Stairs-Number of Steps: 1 Home Layout: One level Home Equipment: Cane - single point;Walker - 4 wheels;Hand held shower head;Shower seat;Bedside commode(shower seat has a back) Additional Comments: owns two homes but mainly stays in personal home    Prior Function Level of Independence: Independent with assistive device(s)         Comments: uses rollator at baseline, normally wears a brief for incontinence.      Hand Dominance   Dominant Hand: Right    Extremity/Trunk Assessment   Upper Extremity Assessment Upper Extremity Assessment: Defer to OT evaluation    Lower Extremity Assessment Lower Extremity Assessment: Generalized weakness(Noted erythema L foot and ankle)    Cervical / Trunk Assessment Cervical / Trunk Assessment: Other exceptions(previous neck surgery)  Communication   Communication: No difficulties  Cognition Arousal/Alertness: Awake/alert Behavior During Therapy:  WFL for tasks assessed/performed Overall Cognitive Status: Within Functional Limits for tasks assessed                                        General Comments General comments (skin integrity, edema, etc.): pt without c/o and tolerates sitting     Exercises     Assessment/Plan     PT Assessment Patient needs continued PT services  PT Problem List Decreased strength;Decreased range of motion;Decreased activity tolerance;Decreased balance;Decreased mobility;Decreased knowledge of use of DME;Decreased safety awareness;Decreased knowledge of precautions;Obesity       PT Treatment Interventions DME instruction;Gait training;Stair training;Functional mobility training;Therapeutic activities;Therapeutic exercise;Balance training;Patient/family education    PT Goals (Current goals can be found in the Care Plan section)  Acute Rehab PT Goals Patient Stated Goal: to call my daughter PT Goal Formulation: With patient Time For Goal Achievement: 07/02/19 Potential to Achieve Goals: Good    Frequency Min 2X/week   Barriers to discharge        Co-evaluation PT/OT/SLP Co-Evaluation/Treatment: Yes Reason for Co-Treatment: Complexity of the patient's impairments (multi-system involvement) PT goals addressed during session: Mobility/safety with mobility OT goals addressed during session: ADL's and self-care;Strengthening/ROM;Proper use of Adaptive equipment and DME       AM-PAC PT "6 Clicks" Mobility  Outcome Measure Help needed turning from your back to your side while in a flat bed without using bedrails?: A Little Help needed moving from lying on your back to sitting on the side of a flat bed without using bedrails?: A Lot Help needed moving to and from a bed to a chair (including a wheelchair)?: A Lot Help needed standing up from a chair using your arms (e.g., wheelchair or bedside chair)?: A Little Help needed to walk in hospital room?: A Lot Help needed climbing 3-5 steps with a railing? : Total 6 Click Score: 13    End of Session Equipment Utilized During Treatment: Gait belt Activity Tolerance: Patient tolerated treatment well Patient left: in chair;with call bell/phone within reach;with chair alarm set Nurse Communication: Mobility status PT Visit Diagnosis:  Muscle weakness (generalized) (M62.81);Other abnormalities of gait and mobility (R26.89)    Time: 1345-1414 PT Time Calculation (min) (ACUTE ONLY): 29 min   Charges:   PT Evaluation $PT Eval Moderate Complexity: 1 Mod          Roney Marion, PT  Acute Rehabilitation Services Pager [phone] Office [phone]   [name] 06/18/2019, 4:58 PM

## 2019-06-18 NOTE — Progress Notes (Signed)
Spoke with patient concerning wearing a CPAP during her stay here with [place] at night she said she would like to wear one.  I advised patient I will set her up on a CPAP at bedtime tonight.

## 2019-06-19 DIAGNOSIS — Z923 Personal history of irradiation: Secondary | ICD-10-CM | POA: Diagnosis not present

## 2019-06-19 DIAGNOSIS — Z20822 Contact with and (suspected) exposure to covid-19: Secondary | ICD-10-CM | POA: Diagnosis present

## 2019-06-19 DIAGNOSIS — R159 Full incontinence of feces: Secondary | ICD-10-CM | POA: Diagnosis present

## 2019-06-19 DIAGNOSIS — L03116 Cellulitis of left lower limb: Secondary | ICD-10-CM | POA: Diagnosis present

## 2019-06-19 DIAGNOSIS — R32 Unspecified urinary incontinence: Secondary | ICD-10-CM | POA: Diagnosis present

## 2019-06-19 DIAGNOSIS — B370 Candidal stomatitis: Secondary | ICD-10-CM | POA: Diagnosis present

## 2019-06-19 DIAGNOSIS — Z6841 Body Mass Index (BMI) 40.0 and over, adult: Secondary | ICD-10-CM | POA: Diagnosis not present

## 2019-06-19 DIAGNOSIS — Z96651 Presence of right artificial knee joint: Secondary | ICD-10-CM | POA: Diagnosis present

## 2019-06-19 DIAGNOSIS — R531 Weakness: Secondary | ICD-10-CM

## 2019-06-19 DIAGNOSIS — Z79899 Other long term (current) drug therapy: Secondary | ICD-10-CM | POA: Diagnosis not present

## 2019-06-19 DIAGNOSIS — E876 Hypokalemia: Secondary | ICD-10-CM | POA: Diagnosis present

## 2019-06-19 DIAGNOSIS — E1165 Type 2 diabetes mellitus with hyperglycemia: Secondary | ICD-10-CM | POA: Diagnosis present

## 2019-06-19 DIAGNOSIS — J302 Other seasonal allergic rhinitis: Secondary | ICD-10-CM | POA: Diagnosis present

## 2019-06-19 DIAGNOSIS — E785 Hyperlipidemia, unspecified: Secondary | ICD-10-CM | POA: Diagnosis present

## 2019-06-19 DIAGNOSIS — T380X5A Adverse effect of glucocorticoids and synthetic analogues, initial encounter: Secondary | ICD-10-CM | POA: Diagnosis present

## 2019-06-19 DIAGNOSIS — N179 Acute kidney failure, unspecified: Secondary | ICD-10-CM | POA: Diagnosis present

## 2019-06-19 DIAGNOSIS — Z87891 Personal history of nicotine dependence: Secondary | ICD-10-CM | POA: Diagnosis not present

## 2019-06-19 DIAGNOSIS — R221 Localized swelling, mass and lump, neck: Secondary | ICD-10-CM | POA: Diagnosis present

## 2019-06-19 DIAGNOSIS — E273 Drug-induced adrenocortical insufficiency: Secondary | ICD-10-CM | POA: Diagnosis present

## 2019-06-19 DIAGNOSIS — Z90710 Acquired absence of both cervix and uterus: Secondary | ICD-10-CM | POA: Diagnosis not present

## 2019-06-19 DIAGNOSIS — E242 Drug-induced Cushing's syndrome: Secondary | ICD-10-CM | POA: Diagnosis present

## 2019-06-19 DIAGNOSIS — N390 Urinary tract infection, site not specified: Secondary | ICD-10-CM | POA: Diagnosis present

## 2019-06-19 DIAGNOSIS — E874 Mixed disorder of acid-base balance: Secondary | ICD-10-CM | POA: Diagnosis present

## 2019-06-19 DIAGNOSIS — E7800 Pure hypercholesterolemia, unspecified: Secondary | ICD-10-CM | POA: Diagnosis present

## 2019-06-19 LAB — COMPREHENSIVE METABOLIC PANEL
ALT: 66 U/L — ABNORMAL HIGH (ref 0–44)
AST: 31 U/L (ref 15–41)
Albumin: 2 g/dL — ABNORMAL LOW (ref 3.5–5.0)
Alkaline Phosphatase: 143 U/L — ABNORMAL HIGH (ref 38–126)
Anion gap: 10 (ref 5–15)
BUN: 12 mg/dL (ref 8–23)
CO2: 28 mmol/L (ref 22–32)
Calcium: 8.8 mg/dL — ABNORMAL LOW (ref 8.9–10.3)
Chloride: 99 mmol/L (ref 98–111)
Creatinine, Ser: 0.86 mg/dL (ref 0.44–1.00)
GFR calc Af Amer: >60 mL/min (ref >60)
GFR calc non Af Amer: >60 mL/min (ref >60)
Glucose, Bld: 175 mg/dL — ABNORMAL HIGH (ref 70–99)
Potassium: 3.3 mmol/L — ABNORMAL LOW (ref 3.5–5.1)
Sodium: 137 mmol/L (ref 135–145)
Total Bilirubin: 0.8 mg/dL (ref 0.3–1.2)
Total Protein: 4.6 g/dL — ABNORMAL LOW (ref 6.5–8.1)

## 2019-06-19 LAB — GLUCOSE, CAPILLARY
Glucose-Capillary: 164 mg/dL — ABNORMAL HIGH (ref 70–99)
Glucose-Capillary: 233 mg/dL — ABNORMAL HIGH (ref 70–99)
Glucose-Capillary: 282 mg/dL — ABNORMAL HIGH (ref 70–99)
Glucose-Capillary: 252 mg/dL — ABNORMAL HIGH (ref 70–99)

## 2019-06-19 LAB — ACTH: C206 ACTH: 12.3 pg/mL (ref 7.2–63.3)

## 2019-06-19 LAB — PHOSPHORUS: Phosphorus: 2.5 mg/dL (ref 2.5–4.6)

## 2019-06-19 MED ORDER — INSULIN GLARGINE 100 UNIT/ML ~~LOC~~ SOLN
15.0000 [IU] | Freq: Every day | SUBCUTANEOUS | Status: DC
  Administered 2019-06-19 – 2019-06-20 (×2): 15 [IU] via SUBCUTANEOUS
  Filled 2019-06-19 (×2): qty 0.15

## 2019-06-19 MED ORDER — POTASSIUM CHLORIDE 20 MEQ PO PACK
40.0000 meq | PACK | Freq: Once | ORAL | Status: AC
  Administered 2019-06-19: 13:00:00 40 meq via ORAL
  Filled 2019-06-19: qty 2

## 2019-06-19 NOTE — Progress Notes (Signed)
Family Medicine Teaching Service Daily Progress Note Intern Pager: [phone]  Patient name: [name] Medical record number: [id number] Date of birth: Jun 08, 1946 Age: 73 y.o. Gender: female  Primary Care Provider: Leonard Downing, MD Consultants: CONSULT FOR UNASSIGNED MEDICAL ADMISSION CONSULT TO TRANSITION OF CARE TEAM Code Status: Full Code   Pt Overview and Major Events to Date:  Hospital Day: 3 06/17/2019: admitted for Weakness 06/17/19: LE extremity DVT US negative, CXR w/ low lung volumes, Neck US w/o abnormality  06/17/19: 1g CTX x 1, Keflex 500 mg q 12 hours (12/30- )   Assessment and Plan: [name] is a 73 y.o. female who presented w/ weakness.  PMHx s/f obesity, hyperglycemia 2/2 prednisone, chronic headaches/migraines, HLD, C8 radiculopathy, bladder and bowel incontinence, OSA  Generalized Weakness Per patient's PCP Dr. Arelia Sneddon she has been on prednisone since March 31, 2019.  Patient is overall a poor historian and cannot provide all of her medications.  Per yesterday's providers note: "She is on multiple medications that can cause weakness at normal doses and with withdrawals, including tizanidine (last filled 11/9 #90), diazepam (02/2019 #90), percocet 5-325 (September #30 @ [facility], October #60 @ [facility]), nortriptyline (03/2019 #180 73m tabs from Dr. YKrista Blue and 75 mg tabs from Dr. EArelia Sneddon, fioricet (02/2019 #30 PRN). " Patient also has history of spinal stenosis according to her PCP, Dr. EArelia Sneddon  In addition to bladder and bowel incontinence, and there is concern for spinal injury in setting of chronic lumbar pain (previously seen with pain specialist).  MRI of the thoracic lumbar region showed: Mild disc and moderate facet degeneration of thoracic spine without neural impingement.  1.3 cm posterior right hepatic lobe lesion not evident in 2007 abdominal CT.  Slight progression of severe 1 to factorial spinal stenosis at L4-5.  Mildly progressive disc  degeneration at L1-2 with new mild spinal stenosis and mild to moderate right neural foraminal stenosis.  Unchanged mild to moderate spinal and lateral recess stenosis at L3-4.  New mild retroperitoneal lymphadenopathy-consider chest abdomen pelvis CT.  Obtaining thoracic level MRI given her neck mass, which could be concerning for malignancy. Lastly, can consider patient's respiratory alkalosis with metabolic alkalosis.  -Consider chest abdomen and pelvis CT per radiologist recommendation to further evaluate retroperitoneal lymphadenopathy -Consider abdominal MRI to further evaluate hepatic lesion -PT/OT consulted, recommendations appreciated -Continue to decrease prednisone, taper by 10 mg every 5 days.  Polypharmacy Details as above.  Patient with 2 different prescriptions for nortriptyline. Per patient husband he apparently is planning to bring all of her pill bottles to the hospital for pharmacy to go through but has not done so yet.   -Continue prednisone taper, pharmacy has already dose this taper through 07/30/2019 -Continue low-dose of nortriptyline and tizanidine in order to avoid withdrawal, plan to attempt tapering off of these in order to avoid polypharmacy -Recommend minimizing addition of new medications unless absolutely necessary  Neck Mass Patient presented with anterior neck mass.  Neck CT shows no anterior neck mass, but with enlarged mediastinal lymph nodes, recommend chest CT.  Her thyroid numbers were within normal limits on admission.   -Discontinue maintenance IV fluids that were used for 24 hours after CT scan to minimize AKI -Monitor blood pressure -Monitor for edema as patient has history of lower extremity edema.  Tachycardia (resolved)  Elevated Troponin (stable)  Elevated BNP  BNP144.8. Troponin stable, [id number] recent heart rate in the 80s.  Normal echocardiogram in August 2020.  -Continue to monitor on telemetry  Dysphagia Thrush on long-term  corticosteroids  GERD with Hx of Barrets In setting of long-term steroid use, and prescription for thrush treatment sent to pharmacy 2 days ago, her dysphagia seems most consistent with a candidal esophagitis.  GI recommends empiric treatment with oral fluconazole and if no symptom improvement in 3-5 days we will reconsult for endoscopy.  If she continues to improve with the fluconazole, we should continue for total course of 21 days.  Speech was consulted for dysphagia and suggested full liquid diet and advance per patient's comfort. -Initial dose of fluconazole 400 mg, followed by 200 mg daily x20 days -Full liquid diet advance per comfort of patient  Hypokalemia Morning labs show potassium of 3.3. -Replaced with potassium packet -Repeat BMP tomorrow  Cellulitis (see resolving) Patient presented with erythema and swelling of the left lower extremity and DVT ultrasound performed in ED and negative for DVT.  Patient was given ceftriaxone 1 g x 1 and has responded well.  Unable to appreciate any erythema today.  She will continue Keflex 500 mg every 12 hours for 4 days, total treatment of 5 days. -Continue Keflex (06/18/2019-06/21/2019)  Elevated LDH  Lactic Acidosis  Chronic Primary Respiratory Alkalosis with Secondary Metabolic Alkalosis Patient is not in any acute respiratory distress, thus no further work up necessary. It is possible that alkalemic state could be cause of lacatemia. Though, should continue to monitor and evaluate. Other differential of LDH elevation: alcohol, cancer, cirrhosis, folate deficiency, anemia, homelysis, liver disease, muscle disease, MI, PCP pna, Stroke, Tissue ischemia, TTP, Vit B12 deficienty, Hepatotoxic drugs  History of bladder and bowel incontinence  Hx of spinal stenosis, per Dr. Arelia Sneddon.  MRI as above. -Consider defer to PCP for outpatient work-up   Hyperglycemia  Likely due to high-dose prednisone.  Her A1c was 12.5.  We have started patient on sliding  scale insulin 3 times daily WC and nightly.  Patient started on 15 units Lantus.  Patient medications include Metformin 500 mg twice daily at home.  However, currently, patient has a lactic acidosis and we do not want to start Metformin at this time.   -Continue moderate sliding scale 3 times per day with meals -Continue to monitor patient's CBGs and adjust Lantus dosing as needed  HLD Most recent lipid profile in August 2020 shows normal levels.  At home, taking simvastatin 20 mg -Hold home simvastatin  Chronic Left Lower Leg Edema  S/p Right Total Knee Was on lasix 80 mg, 1-2 tabs every 8 hours as needed for lower extremity edema.   Unsure of how often patient is taking Lasix at home.  As she has no shortness of breath and given her renal function, will hold Lasix. -Hold Lasix and continue to monitor  AKI on CKD  Improved from August hospitalization -Avoid nephrotoxic agents  Sleep Apnea Patient compliant with CPAP at home - CPAP QHS   NAFLD  ALT/AST  Elevated GGT s/p cholecystecomty  Ultrasound reveals hepatic steatosis.  Per Dr. Arelia Sneddon, patient has had elevated liver enzymes in the past.  This seems to be a chronic problem.  Urine alcohol was negative.  Alk phos elevated at 181 on 12/30 and 79 in 01/2019, 143 today.  GGT elevated at 419.  New liver lesion in MRI as noted above. -Consider abdominal MRI to further evaluate liver lesion   Chronic headaches  No complaints of headaches this morning.  Follows at [facility]. Currently taking Nortriptyline 20 mg QHS (Dr. Krista Blue started a year ago) had an out  of body experience), Rizatriptan PRN. Improved with prednisone in October. -Monitor  Chronic Neck Pain  Hx of anterior cervical Decompression C3-C7  Cervical Radiculopathy at C8 Had a hospitalization in August 2020 for likely cause of her unilateral head and neck pain.  Nothing to do while inpatient -Continue to monitor  Depression?  Patient has Celexa on her medication list, however, has  not been filled since December 22, 2018.  Will not continue during hospitalization.  Sjorgen's?  Per Dr. Arelia Sneddon, patient was diagnosed by Dr. Patrecia Pour with [place] Follow-up outpatient  Hx of radiation Therapy 2005 Rt Breast Cancer   FEN/GI: -Full liquid diet, advance as tolerated -Fluids as above  VTE prophylaxis: Lovenox  Disposition: TBD when medically stable.   Subjective:  Patient without complaints this morning.  She denies pains.  Objective: Temp:  [97.2 F (36.2 C)-98 F (36.7 C)] 97.8 F (36.6 C) (12/31 0553) Pulse Rate:  [82-107] 82 (12/31 0553) Resp:  [14-16] 14 (12/31 0553) BP: (108-124)/(65-70) 108/68 (12/31 0553) SpO2:  [95 %-96 %] 96 % (12/31 0553) Intake/Output      12/30 0701 - 12/31 0700 12/31 0701 - 01/01 0700   P.O. 300    I.V. (mL/kg) 81.8 (0.7)    IV Piggyback     Total Intake(mL/kg) 381.8 (3.4)    Urine (mL/kg/hr) 1200 (0.4)    Total Output 1200    Net -818.2             Physical Exam: General: Alert and oriented in no apparent distress Heart: Regular rate and rhythm with no murmurs appreciated Lungs: CTA bilaterally, no wheezing Abdomen: Bowel sounds present, no abdominal pain MSK: Hip flexor strength 5/5 bilaterally Skin: Warm and dry  Laboratory: CBC: Recent Labs  Lab 06/17/19 1248 06/17/19 1253 06/17/19 1723 06/18/19 0147  WBC 15.6*  --   --  12.5*  NEUTROABS 12.8*  --   --   --   HGB 13.8 13.9 11.6* 13.3  HCT 42.2 41.0 34.0* 39.4  MCV 97.2  --   --  95.9  PLT 236  --   --  269   CMP: Recent Labs  Lab 06/17/19 1248 06/17/19 1253 06/17/19 1723 06/18/19 0147  NA 134* 132* 132* 135  K 4.1 3.9 3.3* 3.3*  CL 91*  --   --  92*  CO2 25  --   --  27  GLUCOSE 321*  --   --  221*  BUN 23  --   --  16  CREATININE 0.89  --   --  1.04*  CALCIUM 9.7  --   --  9.3  MG 2.1  --   --   --   PHOS 2.4*  --   --   --   ALBUMIN 2.7*  --   --  2.6*   CBG: Recent Labs  Lab 06/18/19 0751 06/18/19 1212 06/18/19 1658 06/18/19 1817  06/18/19 2028  GLUCAP 240* 367* 540* 418* 350*   Micro: Covid Negative  Imaging/Diagnostic Tests: CT SOFT TISSUE NECK W CONTRAST  Result Date: 06/18/2019 CLINICAL DATA:  Anterior neck mass EXAM: CT NECK WITH CONTRAST TECHNIQUE: Multidetector CT imaging of the neck was performed using the standard protocol following the bolus administration of intravenous contrast. CONTRAST:  17m OMNIPAQUE IOHEXOL 300 MG/ML  SOLN COMPARISON:  None FINDINGS: Pharynx and larynx: Normal. No mass or swelling. Salivary glands: Fatty replacement of the submandibular and parotid glands. Right parotid is relatively smaller with some distortion of the skin surface;  correlate with history of surgery. Thyroid: Mildly heterogeneous without definite focal lesion. Lymph nodes: There are no pathologically cervical or supraclavicular nodes. Asymmetric nonenlarged 1.2 cm right level 2 node, which could be reactive. Enlarged mediastinal lymph node measuring 2.7 cm on series 3, image 109. Vascular: Major neck vessels are patent. Limited intracranial: No abnormal enhancement. Visualized orbits: Unremarkable. Mastoids and visualized paranasal sinuses: Aerated. Skeleton: Post anterior fusion at C3-C4 as well as C5-T1. Multilevel degenerative changes. Upper chest: No apical lung mass. Other: None. IMPRESSION: No anterior neck mass identified. Enlarged mediastinal lymph node. Chest CT is recommended as there is no comparison available. Electronically Signed   By: Macy Mis M.D.   On: 06/18/2019 14:22   MR THORACIC SPINE WO CONTRAST  Result Date: 06/18/2019 CLINICAL DATA:  Generalized weakness. Spinal stenosis. EXAM: MRI THORACIC SPINE WITHOUT CONTRAST TECHNIQUE: Multiplanar, multisequence MR imaging of the thoracic spine was performed. No intravenous contrast was administered. COMPARISON:  None. FINDINGS: Alignment:  Normal. Vertebrae: Partially visualized multilevel anterior cervical spine fusion extending to T1. No thoracic spine  fracture, suspicious marrow lesion, or significant marrow edema. Cord:  Normal signal and morphology. Paraspinal and other soft tissues: 1.3 cm T2 hyperintense lesion in the posterior right hepatic lobe without a corresponding abnormality on a 2007 abdominal CT. Disc levels: Mild disc bulging at most levels in the thoracic spine. Moderate facet hypertrophy in the upper and lower thoracic spine. No significant spinal stenosis or spinal cord mass effect. No compressive neural foraminal stenosis. IMPRESSION: 1. Mild disc and moderate facet degeneration in the thoracic spine without evidence of neural impingement. 2. 1.3 cm posterior right hepatic lobe lesion, not evident on a 2007 abdominal CT and indeterminate. This could be further evaluated with a contrast-enhanced abdominal MRI. Electronically Signed   By: Logan Bores M.D.   On: 06/18/2019 19:48   MR LUMBAR SPINE WO CONTRAST  Result Date: 06/18/2019 CLINICAL DATA:  Generalized weakness. Spinal stenosis. EXAM: MRI LUMBAR SPINE WITHOUT CONTRAST TECHNIQUE: Multiplanar, multisequence MR imaging of the lumbar spine was performed. No intravenous contrast was administered. COMPARISON:  06/14/2014 FINDINGS: The study is intermittently motion degraded including moderate artifact on the axial T1 sequence. Segmentation: Standard. Alignment: Trace retrolisthesis of L1 on L2 and L5 on S1. Slight left convex curvature of the lumbar spine. Vertebrae: No fracture, suspicious osseous lesion, or significant marrow edema. Mild chronic degenerative endplate changes at U9-8 and L5-S1. Conus medullaris and cauda equina: Conus extends to the L1 level. Conus and cauda equina appear normal. Paraspinal and other soft tissues: Unchanged small right renal cyst. New mildly enlarged para-aortic and bilateral common iliac chain lymph nodes measuring up to 1.1 cm in short axis. Disc levels: Disc desiccation throughout the lumbar spine. Moderate disc space narrowing at L1-2 and L5-S1 with  mild narrowing at L2-3. L1-2: Progressive disc bulging eccentric to the right and mild facet and ligamentum flavum hypertrophy result in new mild spinal stenosis and mild-to-moderate right neural foraminal stenosis with potential right L1 nerve impingement. L2-3: Mild disc bulging, a small right foraminal disc protrusion, and mild-to-moderate facet and ligamentum flavum hypertrophy result in mild right neural foraminal stenosis without significant spinal stenosis, unchanged. L3-4: Disc bulging, a shallow left subarticular disc protrusion, congenitally short pedicles, mild ligamentum flavum hypertrophy, and severe facet hypertrophy result in mild-to-moderate spinal stenosis and mild right and mild-to-moderate left lateral recess stenosis, unchanged. Patent neural foramina. L4-5: Disc bulging, congenitally short pedicles, mild ligamentum flavum hypertrophy, and severe facet hypertrophy result in severe spinal  stenosis, slightly progressed from prior. Patent neural foramina. Left larger than right facet joint effusions. L5-S1: Disc bulging, endplate spurring, disc space height loss, and mild facet hypertrophy result in borderline to mild spinal and lateral recess stenosis and mild bilateral neural foraminal stenosis, unchanged. IMPRESSION: 1. Slight progression of severe multifactorial spinal stenosis at L4-5. 2. Mildly progressive disc degeneration at L1-2 with new mild spinal stenosis and mild-to-moderate right neural foraminal stenosis. 3. Unchanged mild-to-moderate spinal and lateral recess stenosis at L3-4. 4. New mild retroperitoneal lymphadenopathy. Given partially visualized mediastinal lymphadenopathy on neck CT today, consider chest, abdomen, and pelvis CT for further evaluation. Electronically Signed   By: Logan Bores M.D.   On: 06/18/2019 20:15   US SOFT TISSUE HEAD & NECK (NON-THYROID)  Result Date: 06/17/2019 CLINICAL DATA:  Swelling of the neck. EXAM: ULTRASOUND OF HEAD/NECK SOFT TISSUES TECHNIQUE:  Ultrasound examination of the head and neck soft tissues was performed in the area of clinical concern. COMPARISON:  None. FINDINGS: In the patient's area of concern in the right neck, there is no significant sonographic abnormality. There is no drainable fluid collection. There is no mass. IMPRESSION: No sonographic abnormality corresponding to the patient's area of concern. If there is persistent clinical concern, follow-up with a contrast-enhanced CT is recommended. Electronically Signed   By: Constance Holster M.D.   On: 06/17/2019 20:25   DG Chest Port 1 View  Result Date: 06/17/2019 CLINICAL DATA:  73 year old female with a history of weakness and short of breath. EXAM: PORTABLE CHEST 1 VIEW COMPARISON:  01/30/2019 FINDINGS: Cardiomediastinal silhouette unchanged in size and contour. No evidence of central vascular congestion. No interlobular septal thickening. No pneumothorax or pleural effusion. No confluent airspace disease. Low lung volumes. Surgical changes of the cervical region. IMPRESSION: Low lung volumes without evidence of acute cardiopulmonary disease. Electronically Signed   By: Corrie Mckusick D.O.   On: 06/17/2019 12:32   VAS [place] LOWER EXTREMITY VENOUS (DVT) ([facility] and [facility] 7a-7p)  Result Date: 06/17/2019  Lower Venous Study Indications: Swelling, Erythema, and Pain.  Risk Factors: None identified. Limitations: Body habitus, poor ultrasound/tissue interface and patient pain tolerance. Comparison Study: No prior studies. Performing Technologist: Oliver Hum RVT  Examination Guidelines: A complete evaluation includes B-mode imaging, spectral Doppler, color Doppler, and power Doppler as needed of all accessible portions of each vessel. Bilateral testing is considered an integral part of a complete examination. Limited examinations for reoccurring indications may be performed as noted.  +-----+---------------+---------+-----------+----------+--------------+  RIGHTCompressibilityPhasicitySpontaneityPropertiesThrombus Aging +-----+---------------+---------+-----------+----------+--------------+ CFV  Full           Yes      Yes                                 +-----+---------------+---------+-----------+----------+--------------+   +---------+---------------+---------+-----------+----------+--------------+ LEFT     CompressibilityPhasicitySpontaneityPropertiesThrombus Aging +---------+---------------+---------+-----------+----------+--------------+ CFV      Full           Yes      Yes                                 +---------+---------------+---------+-----------+----------+--------------+ SFJ      Full                                                        +---------+---------------+---------+-----------+----------+--------------+  FV Prox  Full                                                        +---------+---------------+---------+-----------+----------+--------------+ FV Mid   Full                                                        +---------+---------------+---------+-----------+----------+--------------+ FV DistalFull                                                        +---------+---------------+---------+-----------+----------+--------------+ PFV      Full                                                        +---------+---------------+---------+-----------+----------+--------------+ POP      Full           Yes      Yes                                 +---------+---------------+---------+-----------+----------+--------------+ PTV      Full                                                        +---------+---------------+---------+-----------+----------+--------------+ PERO     Full                                                        +---------+---------------+---------+-----------+----------+--------------+     Summary: Right: No evidence of common femoral vein  obstruction. Left: There is no evidence of deep vein thrombosis in the lower extremity. However, portions of this examination were limited- see technologist comments above. No cystic structure found in the popliteal fossa.  *See table(s) above for measurements and observations. Electronically signed by Servando Snare MD on 06/17/2019 at 3:47:01 PM.    Final    US Abdomen Limited RUQ  Result Date: 06/17/2019 CLINICAL DATA:  Elevated LFTs EXAM: ULTRASOUND ABDOMEN LIMITED RIGHT UPPER QUADRANT COMPARISON:  CT dated January 05, 2006 FINDINGS: Gallbladder: Surgically absent Common bile duct: Diameter: 6 mm Liver: Diffuse increased echogenicity with slightly heterogeneous liver. Appearance typically secondary to fatty infiltration. Fibrosis secondary consideration. No secondary findings of cirrhosis noted. No focal hepatic lesion or intrahepatic biliary duct dilatation. Portal vein is patent on color Doppler imaging with normal direction of blood flow towards the liver. Other: . IMPRESSION: 1. No acute abnormality. The patient is status post cholecystectomy. 2. Hepatic steatosis. Electronically Signed  By: Constance Holster M.D.   On: 06/17/2019 20:23    EKG Interpretation  Date/Time:  Tuesday June 17 2019 10:31:35 EST Ventricular Rate:  124 PR Interval:    QRS Duration: 71 QT Interval:  289 QTC Calculation: 415 R Axis:   34 Text Interpretation: Sinus tachycardia Probable left atrial enlargement When compared with ECG of 01/30/2019, Nonspecific ST abnormality is no longer present HEART RATE has increased Confirmed by Delora Fuel ([id number]) on 06/17/2019 11:43:37 PM       Lurline Del, DO 06/19/2019, 7:33 AM PGY-1, [place] Intern pager: [phone], text pages welcome

## 2019-06-19 NOTE — Progress Notes (Signed)
Patient was admitted originally for weakness of the lower limbs leading to a fall.  This is now thought to be due to polypharmacy as the patient was found to be on multiple sedating medications as well as high-dose steroids.  I think deconditioning is also a component of this patient's weakness and fall.  Patient appears to have confusion which may be a product of her polypharmacy.  I also have some uncertainty as to the amount of medication she was taking as she was unable to tell me dosages and there is some question as to whether she is taking higher doses of these medications than originally prescribed.  Because of this I would like to watch her closely to ensure no withdrawals. I also do not think she would be a safe discharge home at this time as she is a high fall risk, I believe she would be better served in skilled nursing facility once her hospital work-up is complete.    Lurline Del, DO

## 2019-06-19 NOTE — NC FL2 (Signed)
[place] LEVEL OF CARE SCREENING TOOL     IDENTIFICATION  Patient Name: [name] Birthdate: [birth year] Sex: female Admission Date (Current Location): 06/17/2019  [facility] and [place] Number:  Herbalist and Address:         Provider Number: [id number]  Attending Physician Name and Address:  Lind Covert, MD  Relative Name and Phone Number:       Current Level of Care: SNF Recommended Level of Care: [place] Prior Approval Number:    Date Approved/Denied:   PASRR Number: [id number]  Discharge Plan: SNF    Current Diagnoses: Patient Active Problem List   Diagnosis Date Noted  . Cushingoid side effect of steroids ([place])   . Generalized weakness 06/17/2019  . Hyperglycemia   . Hypophosphatemia   . Cellulitis of leg, left   . Elevated LFTs   . Mass in neck   . Cervical radiculopathy at C8 01/31/2019  . Right arm numbness 01/30/2019  . Chronic migraine 12/06/2017  . Chronic intractable headache 10/23/2017  . Cervical myelopathy with cervical radiculopathy 02/15/2016    Orientation RESPIRATION BLADDER Height & Weight     Self, Time, Situation, Place  Normal Continent Weight: 248 lb (112.5 kg) Height:  5\' 4"  (162.6 cm)  BEHAVIORAL SYMPTOMS/MOOD NEUROLOGICAL BOWEL NUTRITION STATUS      Continent Diet(see discharge summary)  AMBULATORY STATUS COMMUNICATION OF NEEDS Skin   Supervision Verbally Normal, Skin abrasions                       Personal Care Assistance Level of Assistance  Bathing, Dressing Bathing Assistance: Maximum assistance   Dressing Assistance: Maximum assistance     Functional Limitations Info             SPECIAL CARE FACTORS FREQUENCY  OT (By licensed OT), PT (By licensed PT)     PT Frequency: 5 times a week OT Frequency: 5 times a week            Contractures      Additional Factors Info  Code Status Code Status Info: Full             Current  Medications (06/19/2019):  This is the current hospital active medication list Current Facility-Administered Medications  Medication Dose Route Frequency Provider Last Rate Last Admin  . acetaminophen (TYLENOL) tablet 650 mg  650 mg Oral Q6H PRN Benay Pike, MD   650 mg at 06/19/19 0416   Or  . acetaminophen (TYLENOL) suppository 650 mg  650 mg Rectal Q6H PRN Benay Pike, MD      . cephALEXin [facility]) capsule 500 mg  500 mg Oral Q6H Abraham, Sherin, DO   500 mg at 06/19/19 [id number]  . enoxaparin (LOVENOX) injection 40 mg  40 mg Subcutaneous Q24H Benay Pike, MD   40 mg at 06/18/19 1739  . fluconazole (DIFLUCAN) tablet 200 mg  200 mg Oral Daily Wilber Oliphant, MD   200 mg at 06/19/19 1030  . fluticasone (FLONASE) 50 MCG/ACT nasal spray 2 spray  2 spray Each Nare Daily Benay Pike, MD   2 spray at 06/19/19 1031  . insulin aspart (novoLOG) injection 0-20 Units  0-20 Units Subcutaneous TID WC Kathrene Alu, MD   4 Units at 06/19/19 0827  . insulin aspart (novoLOG) injection 0-5 Units  0-5 Units Subcutaneous QHS Benay Pike, MD   4 Units at 06/18/19 2140  . insulin  glargine (LANTUS) injection 15 Units  15 Units Subcutaneous Daily Anderson, Chelsey L, DO   15 Units at 06/19/19 1105  . nortriptyline (PAMELOR) capsule 20 mg  20 mg Oral QHS Wilber Oliphant, MD   20 mg at 06/18/19 2140  . pantoprazole (PROTONIX) EC tablet 40 mg  40 mg Oral Daily Benay Pike, MD   40 mg at 06/19/19 1030  . polyethylene glycol (MIRALAX / GLYCOLAX) packet 17 g  17 g Oral Daily PRN Benay Pike, MD      . potassium chloride (KLOR-CON) packet 40 mEq  40 mEq Oral Once Caroline More, DO      . predniSONE (DELTASONE) tablet 80 mg  80 mg Oral Q breakfast Wilber Oliphant, MD   80 mg at 06/19/19 [id number]   Followed by  . [START ON 06/23/2019] predniSONE (DELTASONE) tablet 70 mg  70 mg Oral Q breakfast Wilber Oliphant, MD       Followed by  . [START ON 06/28/2019] predniSONE (DELTASONE) tablet 60 mg  60 mg Oral Q  breakfast Wilber Oliphant, MD       Followed by  . [START ON 07/03/2019] predniSONE (DELTASONE) tablet 50 mg  50 mg Oral Q breakfast Wilber Oliphant, MD       Followed by  . [START ON 07/08/2019] predniSONE (DELTASONE) tablet 40 mg  40 mg Oral Q breakfast Wilber Oliphant, MD       Followed by  . [START ON 07/13/2019] predniSONE (DELTASONE) tablet 30 mg  30 mg Oral Q breakfast Wilber Oliphant, MD       Followed by  . [START ON 07/18/2019] predniSONE (DELTASONE) tablet 20 mg  20 mg Oral Q breakfast Wilber Oliphant, MD       Followed by  . [START ON 07/23/2019] predniSONE (DELTASONE) tablet 10 mg  10 mg Oral Q breakfast Wilber Oliphant, MD       Followed by  . [START ON 07/28/2019] predniSONE (DELTASONE) tablet 5 mg  5 mg Oral Q breakfast Wilber Oliphant, MD      . tiZANidine (ZANAFLEX) tablet 2 mg  2 mg Oral Q6H Abraham, Sherin, DO   2 mg at 06/19/19 1030     Discharge Medications: Please see discharge summary for a list of discharge medications.  Relevant Imaging Results:  Relevant Lab Results:   Additional Information ss# [place], [place]

## 2019-06-19 NOTE — Progress Notes (Signed)
Spoke with pharmacy regarding tapering patient's tizanidine.  Per pharmacy the tizanidine can be decreased by 2-4 mg/day.  So the patient can start being weaned down in frequency from 3 times daily to twice daily and then daily and then off.  This can be done as quickly as every day.  Patient should be monitored for rebound symptoms such as hypertension, tachycardia, hypertonia.  If any of these it were to occur it would be ideal to slow the taper and decrease the dose every 2-3 days instead of every day.

## 2019-06-20 DIAGNOSIS — R531 Weakness: Secondary | ICD-10-CM

## 2019-06-20 HISTORY — DX: Weakness: R53.1

## 2019-06-20 LAB — GLUCOSE, CAPILLARY
Glucose-Capillary: 170 mg/dL — ABNORMAL HIGH (ref 70–99)
Glucose-Capillary: 199 mg/dL — ABNORMAL HIGH (ref 70–99)
Glucose-Capillary: 345 mg/dL — ABNORMAL HIGH (ref 70–99)
Glucose-Capillary: 326 mg/dL — ABNORMAL HIGH (ref 70–99)

## 2019-06-20 LAB — URINE CULTURE: Culture: >=100000 — AB

## 2019-06-20 MED ORDER — PREDNISONE 50 MG PO TABS: 60.0000 mg | ORAL_TABLET | Freq: Every day | ORAL | Status: DC

## 2019-06-20 MED ORDER — SULFAMETHOXAZOLE-TRIMETHOPRIM 400-80 MG PO TABS: 1.0000 | ORAL_TABLET | Freq: Two times a day (BID) | ORAL | Status: DC

## 2019-06-20 MED ORDER — PREDNISONE 50 MG PO TABS
80.0000 mg | ORAL_TABLET | Freq: Every day | ORAL | Status: DC
  Administered 2019-06-21: 80 mg via ORAL
  Filled 2019-06-20: qty 1

## 2019-06-20 MED ORDER — PREDNISONE 20 MG PO TABS: 40.0000 mg | ORAL_TABLET | Freq: Every day | ORAL | Status: DC

## 2019-06-20 MED ORDER — INSULIN GLARGINE 100 UNIT/ML ~~LOC~~ SOLN
20.0000 [IU] | Freq: Every day | SUBCUTANEOUS | Status: DC
  Administered 2019-06-21: 20 [IU] via SUBCUTANEOUS
  Filled 2019-06-20 (×2): qty 0.2

## 2019-06-20 MED ORDER — PREDNISONE 20 MG PO TABS: 20.0000 mg | ORAL_TABLET | Freq: Every day | ORAL | Status: DC

## 2019-06-20 MED ORDER — PREDNISONE 10 MG PO TABS: 10.0000 mg | ORAL_TABLET | Freq: Every day | ORAL | Status: DC

## 2019-06-20 MED ORDER — SULFAMETHOXAZOLE-TRIMETHOPRIM 400-80 MG PO TABS
1.0000 | ORAL_TABLET | Freq: Two times a day (BID) | ORAL | Status: AC
  Administered 2019-06-20 – 2019-06-22 (×6): 1 via ORAL
  Filled 2019-06-20 (×6): qty 1

## 2019-06-20 MED ORDER — SIMVASTATIN 20 MG PO TABS
20.0000 mg | ORAL_TABLET | Freq: Every day | ORAL | Status: DC
  Administered 2019-06-20 – 2019-06-24 (×5): 20 mg via ORAL
  Filled 2019-06-20 (×5): qty 1

## 2019-06-20 NOTE — Progress Notes (Signed)
Patient reports that has been dropped off medications yesterday, 06/19/2019. She reports that they are in clear [facility] bags.  I called to the front desk to see if they have this medication and they reported that they did not know where it was at the time.  It was not at the front desk.  When I called to the floor, to see if the medication was delivered to the floor, the nurse reports hearing about the medication yesterday but did not receive the medications directly. The medicine is not on the floor.  I called down to [facility] and they did not have any record of her medications being in the hospital nor do they see any medications in the pharmacy.  Wilber Oliphant, M.D.  12:55 PM 06/20/2019

## 2019-06-20 NOTE — Plan of Care (Signed)

## 2019-06-20 NOTE — Progress Notes (Addendum)
Family Medicine Teaching Service Daily Progress Note Intern Pager: [phone]  Patient name: [name] Medical record number: [id number] Date of birth: 11-11-1945 Age: 74 y.o. Gender: female  Primary Care Provider: Leonard Downing, MD Consultants: CONSULT FOR UNASSIGNED MEDICAL ADMISSION CONSULT TO TRANSITION OF CARE TEAM Code Status: Full Code   Pt Overview and Major Events to Date:  Hospital Day: 4 06/17/2019: admitted for Weakness 06/17/19: LE extremity DVT US negative, CXR w/ low lung volumes, Neck US w/o abnormality  06/17/19: 1g CTX x 1, Keflex 500 mg q 12 hours (12/30-1/1)  06/18/19: Fluconazole (12/30 - 06/28/19)  06/18/19: MRI lumbar and thoracic, CT soft tissue  06/20/19: Bactrim (1/1-1/3)  Assessment and Plan: [name] is a 74 y.o. female who presented w/ weakness.  PMHx s/f obesity, hyperglycemia 2/2 prednisone, chronic headaches/migraines, HLD, C8 radiculopathy, bladder and bowel incontinence, OSA  # Generalized Weakness Appears to be improved compared to initial encounter with patient two days ago. Appears more alert. When walking into the room, patient struggles reaching above her head to clip her hair up. She overall feels better, but really wants to work with PT.  She reports that her husband dropped of medications yesterday. Anion gap improved to 10 from 18 at admission.   Can also consider hyperglycemia to be cause of acute symptoms given glucosuria, anion gap and initial glucose of 540. UDS was negative; therefore no recent oxycodone-acetominophen or benzodiazepines leading to weakness.   PT/OT consulted  Prednisone taper now 20 mg q 5 days, currently on 80 mg daily, day 3  Tizanidine taper   Review medications today    Discontinue allegra outpatient   #UTI >100k enterobacter cloacae, resistant to keflex, zosyn and intermediate to macrobid. UTI presented as uncomplicated and will treat as such.   Will start bactrim DS for 3 days   D/c  keflex   D/c purewick.   Bedside commode   # Chronic Left LEE Taking lasix 80 mg daily at home (Rx'ed as 1-2 tabs PRN).  She reports taking this for years.   Daily weights   Can restart lasix if needed (consider lower dose)   #Long term prednisone Use  # Hyperglycema #DM II  No known diabetes prior to prednisone use. Currently on Lantus 15 units  And severe resistance SSI w/ QHS correction. Fasting CBG is 170 this morning. Range between 233 and 282 in last 24 hours and required 28 units of novolog.   Increase lantus to 20 units - continue to titrate   Continue resistant sliding scale   Monitor with prednisone dose   # Dysphagia # GERD, Hx [place]  # Esophagitis 2/2 candida   Continue fluconazole (total tx 21 days, initial load with 400 mg, then 200 mg daily).   Day 3 of 21 fluconazole    Continue PTX daily  #Cellulitis  Continues to improve.   D/C keflex  Switch to bactrim  #Tachycardia, resolved   # CKD stage 1 Creatinine improved 0.86 from 1.04. GFR > 60   Holding lasix    # OSA   Nightly CPAP  #HLD  AST/ALT with only mild elevation.   Restart home statin   # Incidental Findings on CT  # Mediastinal LAD  # Retroperitoneal LAD  Hx of radiation therapy    Follow up outpatient   #Neck mass  No findings on ultrasound. No findings on CT w/ contrast soft tissue neck. No further work up. Patient reports this started after her cervical surgery which  she reports was an anterior procedure.   Monitor and Follow up outpatient   NAFLD  transaminitis  Elevated GGT   Chronic Headaches   Chronic Neck Pain  Hx of anterior cervical Decompression C3-C7  Cervical Radiculopathy at C8  Depression  Sjorgens?   Hx of Bowel and Bladder Incontinence: MRI spine negative for cauda equina syndrome.   #FEN/GI:  . Fluids: Sline lock  . Electrolytes: none  . Nutrition: HH/CM   Access:  VTE prophylaxis: Lovenox 40 (CrCl>30)  Disposition: SNF  When  medically stable    Subjective:  NAEO.   Objective: Temp:  [97.8 F (36.6 C)-98.2 F (36.8 C)] 98 F (36.7 C) (01/01 0518) Pulse Rate:  [91-95] 91 (01/01 0518) Resp:  [14-18] 18 (01/01 0518) BP: (103-117)/(63-77) 111/63 (01/01 0518) SpO2:  [96 %-98 %] 97 % (01/01 0518) Intake/Output      12/31 0701 - 01/01 0700 01/01 0701 - 01/02 0700   P.O. 480    I.V. (mL/kg)     Total Intake(mL/kg) 480 (4.3)    Urine (mL/kg/hr) 1850 (0.7)    Total Output 1850    Net -1370             Physical Exam: General: NAD, non-toxic, well-appearing, sitting comfortably in bed. More alert today    HEENT: [place]/AT. Cardiovascular: RRR, normal S1, S2. B/L 2+ RP. trace BLEE Respiratory: CTAB. No IWOB.  Abdomen: + BS. NT, ND, soft to palpation.  Extremities: Warm and well perfused. Moving spontaneously.  Integumentary: No obvious rashes, lesions, trauma on general exam. Left dorsal foot with no obvious erythema, warmth, swelling  Neuro: A & O x4. CN grossly intact. No FND  Laboratory: I have personally read and reviewed all labs and imaging studies.  CBC: Recent Labs  Lab 06/17/19 1248 06/17/19 1253 06/17/19 1723 06/18/19 0147  WBC 15.6*  --   --  12.5*  NEUTROABS 12.8*  --   --   --   HGB 13.8 13.9 11.6* 13.3  HCT 42.2 41.0 34.0* 39.4  MCV 97.2  --   --  95.9  PLT 236  --   --  269   CMP: Recent Labs  Lab 06/17/19 1248 06/17/19 1723 06/18/19 0147 06/19/19 0759  NA 134* 132* 135 137  K 4.1 3.3* 3.3* 3.3*  CL 91*  --  92* 99  CO2 25  --  27 28  GLUCOSE 321*  --  221* 175*  BUN 23  --  16 12  CREATININE 0.89  --  1.04* 0.86  CALCIUM 9.7  --  9.3 8.8*  MG 2.1  --   --   --   PHOS 2.4*  --   --  2.5  ALBUMIN 2.7*  --  2.6* 2.0*   CBG: Recent Labs  Lab 06/18/19 2028 06/19/19 0744 06/19/19 1217 06/19/19 1642 06/19/19 2026  GLUCAP 350* 164* 233* 282* 252*   Micro: Covid Negative  Recent Results (from the past 240 hour(s))  SARS CORONAVIRUS 2 (TAT 6-24 HRS) Nasopharyngeal  Nasopharyngeal Swab     Status: None   Collection Time: 06/17/19 12:31 PM   Specimen: Nasopharyngeal Swab  Result Value Ref Range Status   SARS Coronavirus 2 NEGATIVE NEGATIVE Final    Comment: (NOTE) SARS-CoV-2 target nucleic acids are NOT DETECTED. The SARS-CoV-2 RNA is generally detectable in upper and lower respiratory specimens during the acute phase of infection. Negative results do not preclude SARS-CoV-2 infection, do not rule out co-infections with other pathogens, and should  not be used as the sole basis for treatment or other patient management decisions. Negative results must be combined with clinical observations, patient history, and epidemiological information. The expected result is Negative. Fact Sheet for Patients: SugarRoll.be Fact Sheet for Healthcare Providers: https://www.woods-mathews.com/ This test is not yet approved or cleared by the [place] FDA and  has been authorized for detection and/or diagnosis of SARS-CoV-2 by FDA under an Emergency Use Authorization (EUA). This EUA will remain  in effect (meaning this test can be used) for the duration of the COVID-19 declaration under Section 56 4(b)(1) of the Act, 21 U.S.C. section 360bbb-3(b)(1), unless the authorization is terminated or revoked sooner. Performed at [facility], [place] [address], [place], [place] [address]   Culture, Urine     Status: Abnormal   Collection Time: 06/18/19  1:58 PM   Specimen: Urine, Random  Result Value Ref Range Status   Specimen Description URINE, RANDOM  Final   Special Requests   Final    NONE Performed at [facility], [place] [address], [place], [place] [address]    Culture >=100,000 COLONIES/mL ENTEROBACTER CLOACAE (A)  Final   Report Status 06/20/2019 FINAL  Final   Organism ID, Bacteria ENTEROBACTER CLOACAE (A)  Final      Susceptibility   Enterobacter cloacae - MIC*    CEFAZOLIN >=64 RESISTANT Resistant      CIPROFLOXACIN <=0.25 SENSITIVE Sensitive     GENTAMICIN <=1 SENSITIVE Sensitive     IMIPENEM 1 SENSITIVE Sensitive     NITROFURANTOIN 64 INTERMEDIATE Intermediate     TRIMETH/SULFA <=20 SENSITIVE Sensitive     PIP/TAZO >=128 RESISTANT Resistant     * >=100,000 COLONIES/mL ENTEROBACTER CLOACAE     Imaging/Diagnostic Tests: CT SOFT TISSUE NECK W CONTRAST  Result Date: 06/18/2019 CLINICAL DATA:  Anterior neck mass EXAM: CT NECK WITH CONTRAST TECHNIQUE: Multidetector CT imaging of the neck was performed using the standard protocol following the bolus administration of intravenous contrast. CONTRAST:  84mL OMNIPAQUE IOHEXOL 300 MG/ML  SOLN COMPARISON:  None FINDINGS: Pharynx and larynx: Normal. No mass or swelling. Salivary glands: Fatty replacement of the submandibular and parotid glands. Right parotid is relatively smaller with some distortion of the skin surface; correlate with history of surgery. Thyroid: Mildly heterogeneous without definite focal lesion. Lymph nodes: There are no pathologically cervical or supraclavicular nodes. Asymmetric nonenlarged 1.2 cm right level 2 node, which could be reactive. Enlarged mediastinal lymph node measuring 2.7 cm on series 3, image 109. Vascular: Major neck vessels are patent. Limited intracranial: No abnormal enhancement. Visualized orbits: Unremarkable. Mastoids and visualized paranasal sinuses: Aerated. Skeleton: Post anterior fusion at C3-C4 as well as C5-T1. Multilevel degenerative changes. Upper chest: No apical lung mass. Other: None. IMPRESSION: No anterior neck mass identified. Enlarged mediastinal lymph node. Chest CT is recommended as there is no comparison available. Electronically Signed   By: Macy Mis M.D.   On: 06/18/2019 14:22   MR THORACIC SPINE WO CONTRAST  Result Date: 06/18/2019 CLINICAL DATA:  Generalized weakness. Spinal stenosis. EXAM: MRI THORACIC SPINE WITHOUT CONTRAST TECHNIQUE: Multiplanar, multisequence MR imaging of the  thoracic spine was performed. No intravenous contrast was administered. COMPARISON:  None. FINDINGS: Alignment:  Normal. Vertebrae: Partially visualized multilevel anterior cervical spine fusion extending to T1. No thoracic spine fracture, suspicious marrow lesion, or significant marrow edema. Cord:  Normal signal and morphology. Paraspinal and other soft tissues: 1.3 cm T2 hyperintense lesion in the posterior right hepatic lobe without a corresponding  abnormality on a 2007 abdominal CT. Disc levels: Mild disc bulging at most levels in the thoracic spine. Moderate facet hypertrophy in the upper and lower thoracic spine. No significant spinal stenosis or spinal cord mass effect. No compressive neural foraminal stenosis. IMPRESSION: 1. Mild disc and moderate facet degeneration in the thoracic spine without evidence of neural impingement. 2. 1.3 cm posterior right hepatic lobe lesion, not evident on a 2007 abdominal CT and indeterminate. This could be further evaluated with a contrast-enhanced abdominal MRI. Electronically Signed   By: Logan Bores M.D.   On: 06/18/2019 19:48   MR LUMBAR SPINE WO CONTRAST  Result Date: 06/18/2019 CLINICAL DATA:  Generalized weakness. Spinal stenosis. EXAM: MRI LUMBAR SPINE WITHOUT CONTRAST TECHNIQUE: Multiplanar, multisequence MR imaging of the lumbar spine was performed. No intravenous contrast was administered. COMPARISON:  06/14/2014 FINDINGS: The study is intermittently motion degraded including moderate artifact on the axial T1 sequence. Segmentation: Standard. Alignment: Trace retrolisthesis of L1 on L2 and L5 on S1. Slight left convex curvature of the lumbar spine. Vertebrae: No fracture, suspicious osseous lesion, or significant marrow edema. Mild chronic degenerative endplate changes at [id number] and L5-S1. Conus medullaris and cauda equina: Conus extends to the L1 level. Conus and cauda equina appear normal. Paraspinal and other soft tissues: Unchanged small right renal cyst.  New mildly enlarged para-aortic and bilateral common iliac chain lymph nodes measuring up to 1.1 cm in short axis. Disc levels: Disc desiccation throughout the lumbar spine. Moderate disc space narrowing at L1-2 and L5-S1 with mild narrowing at L2-3. L1-2: Progressive disc bulging eccentric to the right and mild facet and ligamentum flavum hypertrophy result in new mild spinal stenosis and mild-to-moderate right neural foraminal stenosis with potential right L1 nerve impingement. L2-3: Mild disc bulging, a small right foraminal disc protrusion, and mild-to-moderate facet and ligamentum flavum hypertrophy result in mild right neural foraminal stenosis without significant spinal stenosis, unchanged. L3-4: Disc bulging, a shallow left subarticular disc protrusion, congenitally short pedicles, mild ligamentum flavum hypertrophy, and severe facet hypertrophy result in mild-to-moderate spinal stenosis and mild right and mild-to-moderate left lateral recess stenosis, unchanged. Patent neural foramina. L4-5: Disc bulging, congenitally short pedicles, mild ligamentum flavum hypertrophy, and severe facet hypertrophy result in severe spinal stenosis, slightly progressed from prior. Patent neural foramina. Left larger than right facet joint effusions. L5-S1: Disc bulging, endplate spurring, disc space height loss, and mild facet hypertrophy result in borderline to mild spinal and lateral recess stenosis and mild bilateral neural foraminal stenosis, unchanged. IMPRESSION: 1. Slight progression of severe multifactorial spinal stenosis at L4-5. 2. Mildly progressive disc degeneration at L1-2 with new mild spinal stenosis and mild-to-moderate right neural foraminal stenosis. 3. Unchanged mild-to-moderate spinal and lateral recess stenosis at L3-4. 4. New mild retroperitoneal lymphadenopathy. Given partially visualized mediastinal lymphadenopathy on neck CT today, consider chest, abdomen, and pelvis CT for further evaluation.  Electronically Signed   By: Logan Bores M.D.   On: 06/18/2019 20:15    EKG Interpretation  Date/Time:  Tuesday June 17 2019 10:31:35 EST Ventricular Rate:  124 PR Interval:    QRS Duration: 71 QT Interval:  289 QTC Calculation: 415 R Axis:   34 Text Interpretation: Sinus tachycardia Probable left atrial enlargement When compared with ECG of 01/30/2019, Nonspecific ST abnormality is no longer present HEART RATE has increased Confirmed by Delora Fuel ([id number]) on 06/17/2019 11:43:37 PM        Procedures:    Wilber Oliphant, MD 06/20/2019, 8:12 AM PGY-2, [place]  Medicine FPTS Intern pager: [phone], text pages welcome

## 2019-06-20 NOTE — TOC Progression Note (Signed)
Transition of Care [facility]) - Progression Note    Patient Details  Name: [name] MRN: [id number] Date of Birth: 1945-11-26  Transition of Care [facility]) CM/SW [place], [place] Phone Number: 06/20/2019, 11:07 AM  Clinical Narrative:    CSW spoke with pt via telephone to room. Introduced self, role, reason for call. Pt aware of recommendation for SNF placement. She prefers [facility] placement with preference for [facility] if able. Pt referral made, insurance company is closed for the holiday at this time.    Expected Discharge Plan: [place] Barriers to Discharge: Continued Medical Work up, Ship broker  Expected Discharge Plan and Services Expected Discharge Plan: [place] Choice: [place] arrangements for the past 2 months: Single Family Home                 Readmission Risk Interventions No flowsheet data found.

## 2019-06-20 NOTE — Progress Notes (Signed)
Pt. States she will notify If she decides to wear cpap tonight.

## 2019-06-21 DIAGNOSIS — Z79899 Other long term (current) drug therapy: Secondary | ICD-10-CM

## 2019-06-21 DIAGNOSIS — E1165 Type 2 diabetes mellitus with hyperglycemia: Secondary | ICD-10-CM

## 2019-06-21 LAB — CBC
HCT: 35.7 % — ABNORMAL LOW (ref 36.0–46.0)
Hemoglobin: 11.8 g/dL — ABNORMAL LOW (ref 12.0–15.0)
MCH: 32.2 pg (ref 26.0–34.0)
MCHC: 33.1 g/dL (ref 30.0–36.0)
MCV: 97.5 fL (ref 80.0–100.0)
Platelets: 201 10*3/uL (ref 150–400)
RBC: 3.66 MIL/uL — ABNORMAL LOW (ref 3.87–5.11)
RDW: 15.4 % (ref 11.5–15.5)
WBC: 7.2 10*3/uL (ref 4.0–10.5)
nRBC: 2.1 % — ABNORMAL HIGH (ref 0.0–0.2)

## 2019-06-21 LAB — COMPREHENSIVE METABOLIC PANEL
ALT: 66 U/L — ABNORMAL HIGH (ref 0–44)
AST: 34 U/L (ref 15–41)
Albumin: 2.2 g/dL — ABNORMAL LOW (ref 3.5–5.0)
Alkaline Phosphatase: 164 U/L — ABNORMAL HIGH (ref 38–126)
Anion gap: 10 (ref 5–15)
BUN: 14 mg/dL (ref 8–23)
CO2: 22 mmol/L (ref 22–32)
Calcium: 9.1 mg/dL (ref 8.9–10.3)
Chloride: 103 mmol/L (ref 98–111)
Creatinine, Ser: 0.86 mg/dL (ref 0.44–1.00)
GFR calc Af Amer: >60 mL/min (ref >60)
GFR calc non Af Amer: >60 mL/min (ref >60)
Glucose, Bld: 252 mg/dL — ABNORMAL HIGH (ref 70–99)
Potassium: 4.5 mmol/L (ref 3.5–5.1)
Sodium: 135 mmol/L (ref 135–145)
Total Bilirubin: 0.9 mg/dL (ref 0.3–1.2)
Total Protein: 4.9 g/dL — ABNORMAL LOW (ref 6.5–8.1)

## 2019-06-21 LAB — GLUCOSE, CAPILLARY
Glucose-Capillary: 175 mg/dL — ABNORMAL HIGH (ref 70–99)
Glucose-Capillary: 286 mg/dL — ABNORMAL HIGH (ref 70–99)
Glucose-Capillary: 313 mg/dL — ABNORMAL HIGH (ref 70–99)
Glucose-Capillary: 324 mg/dL — ABNORMAL HIGH (ref 70–99)

## 2019-06-21 MED ORDER — PREDNISONE 1 MG PO TABS: 2.0000 mg | ORAL_TABLET | Freq: Every day | ORAL | Status: DC

## 2019-06-21 MED ORDER — PREDNISONE 20 MG PO TABS
40.0000 mg | ORAL_TABLET | Freq: Every day | ORAL | Status: AC
  Administered 2019-06-24 – 2019-06-25 (×2): 40 mg via ORAL
  Filled 2019-06-21 (×2): qty 2

## 2019-06-21 MED ORDER — PREDNISONE 50 MG PO TABS
60.0000 mg | ORAL_TABLET | Freq: Every day | ORAL | Status: AC
  Administered 2019-06-22 – 2019-06-23 (×2): 60 mg via ORAL
  Filled 2019-06-21 (×2): qty 1

## 2019-06-21 MED ORDER — PREDNISONE 1 MG PO TABS: 3.0000 mg | ORAL_TABLET | Freq: Every day | ORAL | Status: DC

## 2019-06-21 MED ORDER — PREDNISONE 5 MG PO TABS: 5.0000 mg | ORAL_TABLET | Freq: Every day | ORAL | Status: DC

## 2019-06-21 MED ORDER — PREDNISONE 1 MG PO TABS: 4.0000 mg | ORAL_TABLET | Freq: Every day | ORAL | Status: DC

## 2019-06-21 MED ORDER — PREDNISONE 10 MG PO TABS: 10.0000 mg | ORAL_TABLET | Freq: Every day | ORAL | Status: DC

## 2019-06-21 MED ORDER — PREDNISONE 1 MG PO TABS: 1.0000 mg | ORAL_TABLET | Freq: Every day | ORAL | Status: DC

## 2019-06-21 MED ORDER — PREDNISONE 20 MG PO TABS: 20.0000 mg | ORAL_TABLET | Freq: Every day | ORAL | Status: DC

## 2019-06-21 NOTE — Progress Notes (Signed)
Patient's blood sugar 324. Currently no hs coverage. Paged Family medicine on call and made aware

## 2019-06-21 NOTE — TOC Progression Note (Signed)
Transition of Care [facility]) - Progression Note    Patient Details  Name: [name] MRN: [id number] Date of Birth: 1946-05-19  Transition of Care [facility]) CM/SW [place], [place] Phone Number: 06/21/2019, 11:34 AM  Clinical Narrative:    [employer] Medicare closed for weekend, aware of pt preference for SNFs in [facility]. [employer] to review Monday; will submit auth at that time.    Expected Discharge Plan: [place] Barriers to Discharge: Continued Medical Work up, Ship broker  Expected Discharge Plan and Services Expected Discharge Plan: [place] Choice: [place] arrangements for the past 2 months: Single Family Home   Readmission Risk Interventions No flowsheet data found.

## 2019-06-21 NOTE — Progress Notes (Addendum)
Family Medicine Teaching Service Daily Progress Note Intern Pager: [phone]  Patient name: [name] Medical record number: [id number] Date of birth: 03/02/1946 Age: 74 y.o. Gender: female  Primary Care Provider: Leonard Downing, MD Consultants: CONSULT FOR UNASSIGNED MEDICAL ADMISSION CONSULT TO TRANSITION OF CARE TEAM Code Status: Full Code   Pt Overview and Major Events to Date:  Hospital Day: 5 06/17/2019: admitted for Weakness 06/17/19: LE extremity DVT US negative, CXR w/ low lung volumes, Neck US w/o abnormality  06/17/19: 1g CTX x 1, Keflex 500 mg q 12 hours (12/30-1/1)  06/18/19: Fluconazole (12/30 - 06/28/19)  06/18/19: MRI lumbar and thoracic, CT soft tissue  06/20/19: Bactrim (1/1-1/3)  Assessment and Plan: LASHEL JENTZEN is a 74 y.o. female who presented w/ weakness.  PMHx s/f obesity, hyperglycemia 2/2 prednisone, chronic headaches/migraines, HLD, C8 radiculopathy, bladder and bowel incontinence, OSA  Generalized Weakness Patient weakness appears to be improved.  Patient states she has been able to move around her room a little bit including to bedside commode. Confusion also seems to be improving with patient currently A&O x3.  UDS was negative; therefore no recent oxycodone-acetominophen or benzodiazepines leading to weakness.  Patient's medications are present at bedside. -PT/OT  -Tizanidine taper  -Discontinue allegra outpatient  -Taper prednisone as below:  Updated prednisone taper: 80 mg, 60 mg for 2 days, 40 mg for 2 days, 20 mg for 7 days, 10 mg for 7 days, 5 mg for 2 weeks, 4 mg for 2 weeks, 3 mg for 2 weeks, 2 mg for 2 weeks, 1 mg for 2 weeks.  At this time PCP will determine remainder of prednisone taper.  UTI >100k enterobacter cloacae, resistant to keflex, zosyn and intermediate to macrobid. UTI presented as uncomplicated and will treat as such.  -Will start bactrim DS day 2 of 3 -Status post Keflex -Bedside commode   Chronic Left  LEE Taking lasix 80 mg daily at home (Rx'ed as 1-2 tabs PRN).  She reports taking this for years.  -Daily weights  -Can restart lasix if needed (consider lower dose)   Long term prednisone Use  Hyperglycema  Type II DM   No known diabetes prior to prednisone use. Currently on Lantus 20units  And severe resistance SSI w/ QHS correction.  Fasting CBG 175 this morning, 06/21/19.  Range between 175 and 326 in last 24 hours and required 28units of novolog.  -Continue resistant sliding scale  -Monitor with prednisone dose   Dysphagia  GERD, Hx Barrets  Esophagitis 2/2 candida  Patient states dysphagia is improving and that she was able to eat breakfast this morning. -Continue fluconazole (total tx 21 days, initial load with 400 mg, then 200 mg daily).  -Day 4 of 21 fluconazole   -Continue PTX daily  Cellulitis  Improving -D/C keflex and initiate Bactrim as above.    Tachycardia, resolved  -Continue to monitor  CKD stage 1 Creatinine improved 0.86 from 1.04. GFR > 60  -Holding lasix    OSA  -Nightly CPAP  HLD  AST/ALT with only mild elevation.  -Restart home statin   Incidental Findings on CT  Mediastinal LAD  Retroperitoneal LAD  Hx of radiation therapy   -Follow up outpatient   Neck mass  No findings on ultrasound. No findings on CT w/ contrast soft tissue neck. No further work up. Patient reports this started after her cervical surgery which she reports was an anterior procedure.   Monitor and Follow up outpatient  -NAFLD  transaminitis  Elevated GGT  -Chronic Headaches  -Chronic Neck Pain  Hx of anterior cervical Decompression C3-C7  Cervical Radiculopathy at C8 -Depression -Sjorgens?  -Hx of Bowel and Bladder Incontinence: MRI spine negative for cauda equina syndrome.   FEN/GI:  . Fluids: Sline lock  . Electrolytes: none  . Nutrition: HH/CM   Access:  VTE prophylaxis: Lovenox 40 (CrCl>30)  Disposition: SNF  When medically stable    Subjective:   Patient states that her dysphagia is improving.  She states that as long as she has something warm prior to eating such as coffee that she is not having much trouble.  Her confusion also seems to be improved with her being alert and oriented to person, place, time this morning.  Patient states she has been able to get up and move around to the bedside commode and is continuing to work on her incentive spirometry while in the hospital.   Objective: Temp:  [97.3 F (36.3 C)-98 F (36.7 C)] 97.3 F (36.3 C) (01/02 0512) Pulse Rate:  [82-103] 82 (01/02 0512) Resp:  [17-18] 17 (01/02 0512) BP: (94-131)/(58-101) 118/67 (01/02 0512) SpO2:  [95 %-99 %] 99 % (01/02 0512) Weight:  [108 kg-109 kg] 109 kg (01/02 0500) Intake/Output      01/01 0701 - 01/02 0700 01/02 0701 - 01/03 0700   P.O. 120    Total Intake(mL/kg) 120 (1.1)    Urine (mL/kg/hr) 350 (0.1)    Stool 0    Total Output 350    Net -230         Urine Occurrence 4 x    Stool Occurrence 3 x        Physical Exam: General: Alert and oriented to person, place, time Heart: Regular rate and rhythm with no murmurs appreciated Lungs: CTA bilaterally Abdomen: Bowel sounds present, no abdominal pain Skin: Warm and dry  Laboratory: I have personally read and reviewed all labs and imaging studies.  CBC: Recent Labs  Lab 06/17/19 1248 06/17/19 1723 06/18/19 0147 06/21/19 0232  WBC 15.6*  --  12.5* 7.2  NEUTROABS 12.8*  --   --   --   HGB 13.8 11.6* 13.3 11.8*  HCT 42.2 34.0* 39.4 35.7*  MCV 97.2  --  95.9 97.5  PLT 236  --  269 201   CMP: Recent Labs  Lab 06/17/19 1248 06/18/19 0147 06/19/19 0759 06/21/19 0232  NA 134* 135 137 135  K 4.1 3.3* 3.3* 4.5  CL 91* 92* 99 103  CO2 25 27 28 22   GLUCOSE 321* 221* 175* 252*  BUN 23 16 12 14   CREATININE 0.89 1.04* 0.86 0.86  CALCIUM 9.7 9.3 8.8* 9.1  MG 2.1  --   --   --   PHOS 2.4*  --  2.5  --   ALBUMIN 2.7* 2.6* 2.0* 2.2*   CBG: Recent Labs  Lab 06/19/19 2026  06/20/19 0834 06/20/19 1123 06/20/19 1602 06/20/19 2105  GLUCAP 252* 170* 199* 345* 326*   Micro: Covid Negative  Recent Results (from the past 240 hour(s))  SARS CORONAVIRUS 2 (TAT 6-24 HRS) Nasopharyngeal Nasopharyngeal Swab     Status: None   Collection Time: 06/17/19 12:31 PM   Specimen: Nasopharyngeal Swab  Result Value Ref Range Status   SARS Coronavirus 2 NEGATIVE NEGATIVE Final    Comment: (NOTE) SARS-CoV-2 target nucleic acids are NOT DETECTED. The SARS-CoV-2 RNA is generally detectable in upper and lower respiratory specimens during the acute phase of infection. Negative results do not preclude  SARS-CoV-2 infection, do not rule out co-infections with other pathogens, and should not be used as the sole basis for treatment or other patient management decisions. Negative results must be combined with clinical observations, patient history, and epidemiological information. The expected result is Negative. Fact Sheet for Patients: SugarRoll.be Fact Sheet for Healthcare Providers: https://www.woods-mathews.com/ This test is not yet approved or cleared by the [place] FDA and  has been authorized for detection and/or diagnosis of SARS-CoV-2 by FDA under an Emergency Use Authorization (EUA). This EUA will remain  in effect (meaning this test can be used) for the duration of the COVID-19 declaration under Section 56 4(b)(1) of the Act, 21 U.S.C. section 360bbb-3(b)(1), unless the authorization is terminated or revoked sooner. Performed at [facility], [place] [address] [place], [place] [address]   Culture, Urine     Status: Abnormal   Collection Time: 06/18/19  1:58 PM   Specimen: Urine, Random  Result Value Ref Range Status   Specimen Description URINE, RANDOM  Final   Special Requests   Final    NONE Performed at [facility], [place] [address] [place], [place] [address]    Culture >=100,000 COLONIES/mL  ENTEROBACTER CLOACAE (A)  Final   Report Status 06/20/2019 FINAL  Final   Organism ID, Bacteria ENTEROBACTER CLOACAE (A)  Final      Susceptibility   Enterobacter cloacae - MIC*    CEFAZOLIN >=64 RESISTANT Resistant     CIPROFLOXACIN <=0.25 SENSITIVE Sensitive     GENTAMICIN <=1 SENSITIVE Sensitive     IMIPENEM 1 SENSITIVE Sensitive     NITROFURANTOIN 64 INTERMEDIATE Intermediate     TRIMETH/SULFA <=20 SENSITIVE Sensitive     PIP/TAZO >=128 RESISTANT Resistant     * >=100,000 COLONIES/mL ENTEROBACTER CLOACAE     Imaging/Diagnostic Tests: No results found.  EKG Interpretation  Date/Time:  Tuesday June 17 2019 10:31:35 EST Ventricular Rate:  124 PR Interval:    QRS Duration: 71 QT Interval:  289 QTC Calculation: 415 R Axis:   34 Text Interpretation: Sinus tachycardia Probable left atrial enlargement When compared with ECG of 01/30/2019, Nonspecific ST abnormality is no longer present HEART RATE has increased Confirmed by Delora Fuel ([id number]) on 06/17/2019 11:43:37 PM        Procedures:    Lurline Del, DO 06/21/2019, 7:39 AM PGY-1, [place] Intern pager: [phone], text pages welcome

## 2019-06-21 NOTE — Procedures (Signed)
Patient declined the CPAP for tonight.   

## 2019-06-22 LAB — CBC
HCT: 36.4 % (ref 36.0–46.0)
Hemoglobin: 12 g/dL (ref 12.0–15.0)
MCH: 32.2 pg (ref 26.0–34.0)
MCHC: 33 g/dL (ref 30.0–36.0)
MCV: 97.6 fL (ref 80.0–100.0)
Platelets: 267 10*3/uL (ref 150–400)
RBC: 3.73 MIL/uL — ABNORMAL LOW (ref 3.87–5.11)
RDW: 15.5 % (ref 11.5–15.5)
WBC: 8.4 10*3/uL (ref 4.0–10.5)
nRBC: 2.8 % — ABNORMAL HIGH (ref 0.0–0.2)

## 2019-06-22 LAB — GLUCOSE, CAPILLARY
Glucose-Capillary: 148 mg/dL — ABNORMAL HIGH (ref 70–99)
Glucose-Capillary: 282 mg/dL — ABNORMAL HIGH (ref 70–99)
Glucose-Capillary: 321 mg/dL — ABNORMAL HIGH (ref 70–99)
Glucose-Capillary: 252 mg/dL — ABNORMAL HIGH (ref 70–99)

## 2019-06-22 MED ORDER — TIZANIDINE HCL 2 MG PO TABS: 2.0000 mg | ORAL_TABLET | Freq: Once | ORAL | Status: DC

## 2019-06-22 MED ORDER — TIZANIDINE HCL 2 MG PO TABS
2.0000 mg | ORAL_TABLET | Freq: Two times a day (BID) | ORAL | Status: DC
  Administered 2019-06-22 – 2019-06-24 (×5): 2 mg via ORAL
  Filled 2019-06-22 (×5): qty 1

## 2019-06-22 MED ORDER — INSULIN ASPART 100 UNIT/ML ~~LOC~~ SOLN
4.0000 [IU] | Freq: Three times a day (TID) | SUBCUTANEOUS | Status: DC
  Administered 2019-06-22 – 2019-06-24 (×7): 4 [IU] via SUBCUTANEOUS

## 2019-06-22 MED ORDER — TIZANIDINE HCL 2 MG PO TABS: 2.0000 mg | ORAL_TABLET | Freq: Every evening | ORAL | Status: DC | PRN

## 2019-06-22 MED ORDER — INSULIN GLARGINE 100 UNIT/ML ~~LOC~~ SOLN
25.0000 [IU] | Freq: Every day | SUBCUTANEOUS | Status: DC
  Administered 2019-06-22 – 2019-06-23 (×2): 25 [IU] via SUBCUTANEOUS
  Filled 2019-06-22 (×2): qty 0.25

## 2019-06-22 NOTE — Progress Notes (Addendum)
Inpatient Diabetes Program Recommendations  AACE/ADA: New Consensus Statement on Inpatient Glycemic Control (2015)  Target Ranges:  Prepandial:   less than 140 mg/dL      Peak postprandial:   less than 180 mg/dL (1-2 hours)      Critically ill patients:  140 - 180 mg/dL   Lab Results  Component Value Date   GLUCAP 282 (H) 06/22/2019   HGBA1C 12.5 (H) 06/17/2019    Review of Glycemic Control Results for [name], [name]" (MRN [id number]) as of 06/22/2019 12:16  Ref. Range 06/21/2019 11:57 06/21/2019 16:49 06/21/2019 20:49 06/22/2019 07:41 06/22/2019 11:30  Glucose-Capillary Latest Ref Range: 70 - 99 mg/dL 286 (H) 313 (H) 324 (H) 148 (H) 282 (H)   Inpatient Diabetes Program Recommendations:   Fasting CBG improved with basal added. -Add Novolog 4 units tid meal coverage if eats 50% Spoke with Dr. Nuala Alpha.  Thank you, [name]. Patricia Wandrey, RN, MSN, CDE  Diabetes Coordinator Inpatient Glycemic Control Team Team Pager [phone] (8am-5pm) 06/22/2019 12:17 PM

## 2019-06-22 NOTE — Progress Notes (Signed)
Pt placed on full face CPAP for the night.  Tolerating well at this time.  RT will continue to monitor.

## 2019-06-22 NOTE — Progress Notes (Addendum)
Family Medicine Teaching Service Daily Progress Note Intern Pager: [phone]  Patient name: [name] Medical record number: [id number] Date of birth: 12-03-1945 Age: 74 y.o. Gender: female  Primary Care Provider: Leonard Downing, MD Consultants: CONSULT FOR UNASSIGNED MEDICAL ADMISSION CONSULT TO TRANSITION OF CARE TEAM Code Status: Full Code   Pt Overview and Major Events to Date:  Hospital Day: 6 06/17/2019: admitted for Weakness 06/17/19: LE extremity DVT US negative, CXR w/ low lung volumes, Neck US w/o abnormality  06/17/19: 1g CTX x 1, Keflex 500 mg q 12 hours (12/30-1/1)  06/18/19: Fluconazole (12/30 - 06/28/19)  06/18/19: MRI lumbar and thoracic, CT soft tissue  06/20/19: Bactrim (1/1-1/3)  Assessment and Plan: [name] is a 74 y.o. female who presented w/ weakness.  PMHx s/f obesity, hyperglycemia 2/2 prednisone, chronic headaches/migraines, HLD, C8 radiculopathy, bladder and bowel incontinence, OSA  Generalized Weakness Acute on chronic, improving. Most likely due to iatrogenic adrenal insufficiency and polypharmacy. Patient states she has been able to move around her room a little bit including to bedside commode. Confusion also seems to be improving with patient currently A&O x3.  UDS was negative; therefore no recent oxycodone-acetominophen or benzodiazepines leading to weakness. -PT/OT recommends SNF, patient is amendable and social work is putting in order for preferred SNF in [place]. -Tizanidine taper - moving to 2mg  BID today, recommend in two days go to 2mg  QHS PRN if tolerated -Discontinue allegra outpatient  -Taper prednisone as below:  Updated prednisone taper: 80 mg, 60 mg for 2 days, 40 mg for 2 days, 20 mg for 7 days, 10 mg for 7 days, 5 mg for 2 weeks, 4 mg for 2 weeks, 3 mg for 2 weeks, 2 mg for 2 weeks, 1 mg for 2 weeks.  At this time PCP will determine remainder of prednisone taper.  UTI >100k enterobacter cloacae, resistant to keflex,  zosyn and intermediate to macrobid. UTI presented as uncomplicated and will treat as such.  -Will start bactrim BID (1/1-1/3) -Status post Keflex -Bedside commode   Chronic Left LEE Taking lasix 80 mg daily at home (Rx'ed as 1-2 tabs PRN).  She reports taking this for years.  - Daily weights  - Can restart lasix if needed (consider lower dose)   Long term prednisone Use  Hyperglycema  Type II DM   No known diabetes prior to prednisone use. Currently on Lantus 20 units  And severe resistance SSI w/ QHS correction.  Fasting CBG 148 this morning, 06/22/19.  Range between 148 and 324 in last 24 hours and required 34units of novolog.  - Continue resistant sliding scale  - Adding 4U Novolog meal coverage - Increasing Lantus from 20 to 25U - Monitor with prednisone dose   Dysphagia  GERD, Hx Barrets  Esophagitis 2/2 candida  Patient states dysphagia is improving and that she was able to eat breakfast this morning. -Continue fluconazole (total tx 21 days, initial load with 400 mg, then 200 mg daily).  - Day 4 of 21 fluconazole   - Continue PTX daily  Cellulitis  Improving - D/C keflex and initiate Bactrim as above.    CKD stage 1 Creatinine improved 0.86 from 1.04. GFR > 60  - Cont holding lasix    OSA  - Nightly CPAP  HLD  AST/ALT with only mild elevation.  - Cont statin   Incidental Findings on CT  Mediastinal LAD  Retroperitoneal LAD  Hx of radiation therapy   -Follow up outpatient   Neck mass  No findings on ultrasound. No findings on CT w/ contrast soft tissue neck. No further work up. Patient reports this started after her cervical surgery which she reports was an anterior procedure.   Monitor and Follow up outpatient  -NAFLD  transaminitis  Elevated GGT  -Chronic Headaches  -Chronic Neck Pain  Hx of anterior cervical Decompression C3-C7  Cervical Radiculopathy at C8 -Depression -Sjorgens?  -Hx of Bowel and Bladder Incontinence: MRI spine negative for cauda  equina syndrome.   FEN/GI:  . Fluids: Sline lock  . Electrolytes: none  . Nutrition: HH/CM   Access:  VTE prophylaxis: Lovenox 40 (CrCl>30)  Disposition: SNF  When medically stable    Subjective:  Patient states she feels much improved today. She did not sleep well but she is eating and finds it much easier to swallow and tolerate foods. She had a salad yesterday which she states was too painful to eat before admission. She is getting up and out of bed and working with PT. No complaints today.  Objective: Temp:  [97.8 F (36.6 C)-98.6 F (37 C)] 97.8 F (36.6 C) (01/03 0502) Pulse Rate:  [88-98] 88 (01/03 0502) Cardiac Rhythm: Sinus tachycardia (01/03 0827) Resp:  [16-18] 16 (01/03 0502) BP: (103-132)/(62-78) 132/78 (01/03 0502) SpO2:  [97 %-98 %] 98 % (01/03 0502) Weight:  [104.8 kg-106.9 kg] 104.8 kg (01/03 0502) Intake/Output      01/02 0701 - 01/03 0700 01/03 0701 - 01/04 0700   P.O. 840    Total Intake(mL/kg) 840 (8)    Urine (mL/kg/hr)     Stool     Total Output     Net +840         Urine Occurrence 4 x    Stool Occurrence 1 x        Physical Exam: Gen: Alert and Oriented x 3, NAD CV: RRR, no murmurs, normal S1, S2 split Resp: CTAB, no wheezing, rales, or rhonchi, comfortable work of breathing Abd: non-distended, non-tender, soft, +bs in all four quadrants Ext: no clubbing, cyanosis, or edema Skin: warm, dry, intact, no rashes  Laboratory: I have personally read and reviewed all labs and imaging studies.  CBC: Recent Labs  Lab 06/17/19 1248 06/18/19 0147 06/21/19 0232 06/22/19 0155  WBC 15.6* 12.5* 7.2 8.4  NEUTROABS 12.8*  --   --   --   HGB 13.8 13.3 11.8* 12.0  HCT 42.2 39.4 35.7* 36.4  MCV 97.2 95.9 97.5 97.6  PLT 236 269 201 267   CMP: Recent Labs  Lab 06/17/19 1248 06/18/19 0147 06/19/19 0759 06/21/19 0232  NA 134* 135 137 135  K 4.1 3.3* 3.3* 4.5  CL 91* 92* 99 103  CO2 25 27 28 22   GLUCOSE 321* 221* 175* 252*  BUN 23 16 12 14    CREATININE 0.89 1.04* 0.86 0.86  CALCIUM 9.7 9.3 8.8* 9.1  MG 2.1  --   --   --   PHOS 2.4*  --  2.5  --   ALBUMIN 2.7* 2.6* 2.0* 2.2*   CBG: Recent Labs  Lab 06/21/19 0753 06/21/19 1157 06/21/19 1649 06/21/19 2049 06/22/19 0741  GLUCAP 175* 286* 313* 324* 148*   Micro: Covid Negative  Recent Results (from the past 240 hour(s))  SARS CORONAVIRUS 2 (TAT 6-24 HRS) Nasopharyngeal Nasopharyngeal Swab     Status: None   Collection Time: 06/17/19 12:31 PM   Specimen: Nasopharyngeal Swab  Result Value Ref Range Status   SARS Coronavirus 2 NEGATIVE NEGATIVE Final  Comment: (NOTE) SARS-CoV-2 target nucleic acids are NOT DETECTED. The SARS-CoV-2 RNA is generally detectable in upper and lower respiratory specimens during the acute phase of infection. Negative results do not preclude SARS-CoV-2 infection, do not rule out co-infections with other pathogens, and should not be used as the sole basis for treatment or other patient management decisions. Negative results must be combined with clinical observations, patient history, and epidemiological information. The expected result is Negative. Fact Sheet for Patients: SugarRoll.be Fact Sheet for Healthcare Providers: https://www.woods-mathews.com/ This test is not yet approved or cleared by the [place] FDA and  has been authorized for detection and/or diagnosis of SARS-CoV-2 by FDA under an Emergency Use Authorization (EUA). This EUA will remain  in effect (meaning this test can be used) for the duration of the COVID-19 declaration under Section 56 4(b)(1) of the Act, 21 U.S.C. section 360bbb-3(b)(1), unless the authorization is terminated or revoked sooner. Performed at [facility], [place] [address] [place], [place] [address]   Culture, Urine     Status: Abnormal   Collection Time: 06/18/19  1:58 PM   Specimen: Urine, Random  Result Value Ref Range Status   Specimen  Description URINE, RANDOM  Final   Special Requests   Final    NONE Performed at [facility], [place] [address] [place], [place] [address]    Culture >=100,000 COLONIES/mL ENTEROBACTER CLOACAE (A)  Final   Report Status 06/20/2019 FINAL  Final   Organism ID, Bacteria ENTEROBACTER CLOACAE (A)  Final      Susceptibility   Enterobacter cloacae - MIC*    CEFAZOLIN >=64 RESISTANT Resistant     CIPROFLOXACIN <=0.25 SENSITIVE Sensitive     GENTAMICIN <=1 SENSITIVE Sensitive     IMIPENEM 1 SENSITIVE Sensitive     NITROFURANTOIN 64 INTERMEDIATE Intermediate     TRIMETH/SULFA <=20 SENSITIVE Sensitive     PIP/TAZO >=128 RESISTANT Resistant     * >=100,000 COLONIES/mL ENTEROBACTER CLOACAE     Imaging/Diagnostic Tests: No results found.  EKG Interpretation  Date/Time:  Tuesday June 17 2019 10:31:35 EST Ventricular Rate:  124 PR Interval:    QRS Duration: 71 QT Interval:  289 QTC Calculation: 415 R Axis:   34 Text Interpretation: Sinus tachycardia Probable left atrial enlargement When compared with ECG of 01/30/2019, Nonspecific ST abnormality is no longer present HEART RATE has increased Confirmed by Delora Fuel ([id number]) on 06/17/2019 11:43:37 PM        Procedures:    Nuala Alpha, DO 06/22/2019, 9:06 AM PGY-3, [place] Intern pager: [phone], text pages welcome

## 2019-06-22 NOTE — Progress Notes (Signed)
Physical Therapy Treatment Patient Details Name: [name] MRN: [id number] DOB: 23-Apr-1946 Today's Date: 06/22/2019    History of Present Illness 74 yo female s/p fall with LLE Cellulitis with tachycardia PMH ADD, anxiety, arthritis, DM, obesity, hx of radiation, sleep apnea, ACDF 2017, breast lumpectomy, R TKA 2014    PT Comments    Continuing work on functional mobility and activity tolerance;  Able to walk in room with RW and minguard assist; cues to self-monitor for activity tolerance, and chair push behind for safety; agree with SNF for post-acute rehab to maximize independence and safety with mobility in order to safely dc home   Follow Up Recommendations  SNF     Equipment Recommendations  None recommended by PT    Recommendations for Other Services       Precautions / Restrictions Precautions Precautions: Fall Restrictions Weight Bearing Restrictions: No    Mobility  Bed Mobility Overal bed mobility: Needs Assistance Bed Mobility: Supine to Sit     Supine to sit: Min guard     General bed mobility comments: Minguard assist for safety; inefficient movement and HOB elevated, but did not need physical assist  Transfers Overall transfer level: Needs assistance Equipment used: Rolling walker (2 wheeled) Transfers: Sit to/from Stand Sit to Stand: Min assist         General transfer comment: Cues for hand placement and safety; min assist to steady RW  Ambulation/Gait Ambulation/Gait assistance: Min assist;+2 safety/equipment(chair push) Gait Distance (Feet): 70 Feet Assistive device: Rolling walker (2 wheeled) Gait Pattern/deviations: Step-through pattern;Decreased step length - right;Decreased step length - left;Decreased stride length Gait velocity: slow   General Gait Details: Cues to self-monitor for activity tolerance   Stairs             Wheelchair Mobility    Modified Rankin (Stroke Patients Only)       Balance     Sitting  balance-Leahy Scale: Good       Standing balance-Leahy Scale: Poor                              Cognition Arousal/Alertness: Awake/alert Behavior During Therapy: WFL for tasks assessed/performed Overall Cognitive Status: Within Functional Limits for tasks assessed                                        Exercises      General Comments General comments (skin integrity, edema, etc.): Discussed benefits fo being OOB and encouraged pt to stay OOB for at least an hour; pillow place in seat of chair for more comfort and therefore more sitting tolerance      Pertinent Vitals/Pain Pain Assessment: No/denies pain    Home Living                      Prior Function            PT Goals (current goals can now be found in the care plan section) Acute Rehab PT Goals Patient Stated Goal: Hopes to be able to go to [facility] for rehab PT Goal Formulation: With patient Time For Goal Achievement: 07/02/19 Potential to Achieve Goals: Good Progress towards PT goals: Progressing toward goals    Frequency    Min 2X/week      PT Plan Current plan remains appropriate    Co-evaluation  AM-PAC PT "6 Clicks" Mobility   Outcome Measure  Help needed turning from your back to your side while in a flat bed without using bedrails?: None Help needed moving from lying on your back to sitting on the side of a flat bed without using bedrails?: A Little Help needed moving to and from a bed to a chair (including a wheelchair)?: A Little Help needed standing up from a chair using your arms (e.g., wheelchair or bedside chair)?: A Little Help needed to walk in hospital room?: A Little Help needed climbing 3-5 steps with a railing? : A Lot 6 Click Score: 18    End of Session Equipment Utilized During Treatment: Gait belt Activity Tolerance: Patient tolerated treatment well Patient left: in chair;with call bell/phone within reach;with chair alarm  set Nurse Communication: Mobility status PT Visit Diagnosis: Muscle weakness (generalized) (M62.81);Other abnormalities of gait and mobility (R26.89)     Time: 0943-1000 PT Time Calculation (min) (ACUTE ONLY): 17 min  Charges:  $Gait Training: 8-22 mins                     Roney [place], [place]  Acute Rehabilitation Services Pager [phone] Office [phone]    [name] 06/22/2019, 11:22 AM

## 2019-06-23 ENCOUNTER — Encounter (HOSPITAL_COMMUNITY): Payer: Self-pay | Admitting: Family Medicine

## 2019-06-23 LAB — CBC
HCT: 38.7 % (ref 36.0–46.0)
Hemoglobin: 12.1 g/dL (ref 12.0–15.0)
MCH: 32.1 pg (ref 26.0–34.0)
MCHC: 31.3 g/dL (ref 30.0–36.0)
MCV: 102.7 fL — ABNORMAL HIGH (ref 80.0–100.0)
Platelets: 296 10*3/uL (ref 150–400)
RBC: 3.77 MIL/uL — ABNORMAL LOW (ref 3.87–5.11)
RDW: 15.9 % — ABNORMAL HIGH (ref 11.5–15.5)
WBC: 9.4 10*3/uL (ref 4.0–10.5)
nRBC: 4.7 % — ABNORMAL HIGH (ref 0.0–0.2)

## 2019-06-23 LAB — BASIC METABOLIC PANEL
Anion gap: 10 (ref 5–15)
BUN: 15 mg/dL (ref 8–23)
CO2: 22 mmol/L (ref 22–32)
Calcium: 9.3 mg/dL (ref 8.9–10.3)
Chloride: 106 mmol/L (ref 98–111)
Creatinine, Ser: 1.22 mg/dL — ABNORMAL HIGH (ref 0.44–1.00)
GFR calc Af Amer: 51 mL/min — ABNORMAL LOW (ref >60)
GFR calc non Af Amer: 44 mL/min — ABNORMAL LOW (ref >60)
Glucose, Bld: 141 mg/dL — ABNORMAL HIGH (ref 70–99)
Potassium: 4.9 mmol/L (ref 3.5–5.1)
Sodium: 138 mmol/L (ref 135–145)

## 2019-06-23 LAB — T3, FREE: T3, Free: 2.8 pg/mL (ref 2.0–4.4)

## 2019-06-23 LAB — GLUCOSE, CAPILLARY
Glucose-Capillary: 158 mg/dL — ABNORMAL HIGH (ref 70–99)
Glucose-Capillary: 145 mg/dL — ABNORMAL HIGH (ref 70–99)
Glucose-Capillary: 302 mg/dL — ABNORMAL HIGH (ref 70–99)
Glucose-Capillary: 237 mg/dL — ABNORMAL HIGH (ref 70–99)

## 2019-06-23 LAB — SARS CORONAVIRUS 2 (TAT 6-24 HRS): SARS Coronavirus 2: NEGATIVE

## 2019-06-23 MED ORDER — INSULIN STARTER KIT- PEN NEEDLES (ENGLISH)
1.0000 | Freq: Once | Status: DC
  Filled 2019-06-23: qty 1

## 2019-06-23 MED ORDER — LIVING WELL WITH DIABETES BOOK
Freq: Once | Status: AC
  Filled 2019-06-23: qty 1

## 2019-06-23 MED ORDER — INSULIN GLARGINE 100 UNIT/ML ~~LOC~~ SOLN
30.0000 [IU] | Freq: Every day | SUBCUTANEOUS | Status: DC
  Administered 2019-06-24 – 2019-06-25 (×2): 30 [IU] via SUBCUTANEOUS
  Filled 2019-06-23 (×2): qty 0.3

## 2019-06-23 NOTE — Progress Notes (Signed)
Patient stated she will place herself on CPAP when ready. RT instructed patient to have RT called if assistance is needed. RT will monitor as needed.

## 2019-06-23 NOTE — Progress Notes (Signed)
Family Medicine Teaching Service Daily Progress Note Intern Pager: [phone]  Patient name: [name] Medical record number: [id number] Date of birth: 1946-03-17 Age: 74 y.o. Gender: female  Primary Care Provider: Leonard Downing, MD Consultants: CONSULT FOR UNASSIGNED MEDICAL ADMISSION CONSULT TO TRANSITION OF CARE TEAM CONSULT TO DIABETES COORDINATOR Code Status: Full Code   Pt Overview and Major Events to Date:  Hospital Day: 7 06/17/2019: admitted for Weakness 06/17/19: LE extremity DVT US negative, CXR w/ low lung volumes, Neck US w/o abnormality  06/17/19: 1g CTX x 1, Keflex 500 mg q 12 hours (12/30-1/1)  06/18/19: Fluconazole (12/30 - 06/28/19)  06/18/19: MRI lumbar and thoracic, CT soft tissue  06/20/19: Bactrim (1/1-1/3)  Assessment and Plan: [name] is a 74 y.o. female who presented w/ weakness.  PMHx s/f obesity, hyperglycemia 2/2 prednisone, chronic headaches/migraines, HLD, C8 radiculopathy, bladder and bowel incontinence, OSA  Generalized Weakness Improving, most likely due to iatrogenic adrenal insufficiency and polypharmacy.  Patient states she has been able to move around her room a little bit including to bedside commode. Confusion also seems to be improving with patient currently A&O x3.  UDS was negative; therefore no recent oxycodone-acetominophen or benzodiazepines leading to weakness. -PT/OT recommends SNF, patient is amendable and preferred SNF in [place]. -Tizanidine taper -currently at 2 mg twice daily, recommend in two days go to 2mg  QHS PRN if tolerated -Discontinue allegra outpatient  -Taper prednisone as below:  Updated prednisone taper: 80 mg, 60 mg for 2 days, 40 mg for 2 days, 20 mg for 7 days, 10 mg for 7 days, 5 mg for 2 weeks, 4 mg for 2 weeks, 3 mg for 2 weeks, 2 mg for 2 weeks, 1 mg for 2 weeks.  At this time PCP will determine remainder of prednisone taper.  UTI >100k enterobacter cloacae, resistant to keflex, zosyn and  intermediate to macrobid. UTI presented as uncomplicated and will treat as such.  -Status post bactrim BID (1/1-1/3) -Status post Keflex -Bedside commode   Chronic Left LEE Taking lasix 80 mg daily at home (Rx'ed as 1-2 tabs PRN).  She reports taking this for years.  Weight on admission 112 kg, currently 106.9kg. - Daily weights  - Can restart lasix if needed (consider lower dose)   Long term prednisone Use  Hyperglycema  Type II DM   No known diabetes prior to prednisone use. Currently on Lantus 25units and severe resistance SSI w/ QHS correction.  Fasting CBG 158 this morning, 06/23/19.  Range between 148 and 321 in last 24 hours and required 36units of novolog.  - Continue resistant sliding scale  -Plan to increase lantus to 30 units - Monitor with prednisone dose   Dysphagia  GERD, Hx Barrets  Esophagitis 2/2 candida  Patient states dysphagia is improving and that she was able to eat breakfast this morning. -Continue fluconazole (total tx 21 days, initial load with 400 mg, then 200 mg daily).  - Day 5 of 21 fluconazole   - Continue PTX daily  Cellulitis  Resolved.    CKD stage 1 Current creatinine 1.22.  Current GFR estimated 44. - Cont holding lasix    OSA  - Nightly CPAP  HLD  AST/ALT with only mild elevation.  - Cont statin   Incidental Findings on CT  Mediastinal LAD  Retroperitoneal LAD  Hx of radiation therapy   -Follow up outpatient   Neck mass  No findings on ultrasound. No findings on CT w/ contrast soft tissue neck. No  further work up. Patient reports this started after her cervical surgery which she reports was an anterior procedure.   Monitor and Follow up outpatient  -NAFLD  transaminitis  Elevated GGT  -Chronic Headaches  -Chronic Neck Pain  Hx of anterior cervical Decompression C3-C7  Cervical Radiculopathy at C8 -Depression -Sjorgens?  -Hx of Bowel and Bladder Incontinence: MRI spine negative for cauda equina syndrome.   FEN/GI:   . Fluids: Saline lock . Electrolytes: none  . Nutrition: HH/CM   Access:  VTE prophylaxis: Lovenox 40 (CrCl>30)  Disposition: Medically stable for discharge to SNF  Subjective:  Patient endorses that for the past 1-2 days she has had a left-sided headache similar to what prompted her treatment for temporal arteritis.  She states this headache seems to come and go and she believes it is worse today than it was yesterday.  She states she is very motivated to work on physical therapy and become more mobile.  She is also motivated to continue speaking with her diabetes coordinator to gain better insight into improving her diabetes through diet in addition to her medications.  Objective: Temp:  [97.5 F (36.4 C)-98.5 F (36.9 C)] 97.5 F (36.4 C) (01/04 0522) Pulse Rate:  [97-110] 97 (01/04 0522) Cardiac Rhythm: Sinus tachycardia (01/03 2010) Resp:  [16-19] 16 (01/04 0522) BP: (131-138)/(77-87) 133/79 (01/04 0522) SpO2:  [96 %-99 %] 99 % (01/04 0522) Weight:  [106.9 kg] 106.9 kg (01/04 0500) Intake/Output      01/03 0701 - 01/04 0700   P.O. 920   Total Intake(mL/kg) 920 (8.6)   Net +920       Urine Occurrence 7 x       Physical Exam: General: Alert and oriented in no apparent distress Heart: Regular rate and rhythm with no murmurs appreciated Lungs: CTA bilaterally, no wheezing Abdomen: Bowel sounds present, no abdominal pain Neuro: CN II through XII intact, fine touch sensation intact bilaterally in upper and lower extremities, strength 5/5 bilaterally in upper and lower extremities.  Laboratory: I have personally read and reviewed all labs and imaging studies.  CBC: Recent Labs  Lab 06/17/19 1248 06/18/19 0147 06/21/19 0232 06/22/19 0155  WBC 15.6* 12.5* 7.2 8.4  NEUTROABS 12.8*  --   --   --   HGB 13.8 13.3 11.8* 12.0  HCT 42.2 39.4 35.7* 36.4  MCV 97.2 95.9 97.5 97.6  PLT 236 269 201 267   CMP: Recent Labs  Lab 06/17/19 1248 06/18/19 0147 06/19/19 0759  06/21/19 0232  NA 134* 135 137 135  K 4.1 3.3* 3.3* 4.5  CL 91* 92* 99 103  CO2 25 27 28 22   GLUCOSE 321* 221* 175* 252*  BUN 23 16 12 14   CREATININE 0.89 1.04* 0.86 0.86  CALCIUM 9.7 9.3 8.8* 9.1  MG 2.1  --   --   --   PHOS 2.4*  --  2.5  --   ALBUMIN 2.7* 2.6* 2.0* 2.2*   CBG: Recent Labs  Lab 06/21/19 2049 06/22/19 0741 06/22/19 1130 06/22/19 1629 06/22/19 2137  GLUCAP 324* 148* 282* 321* 252*   Micro: Covid Negative  Recent Results (from the past 240 hour(s))  SARS CORONAVIRUS 2 (TAT 6-24 HRS) Nasopharyngeal Nasopharyngeal Swab     Status: None   Collection Time: 06/17/19 12:31 PM   Specimen: Nasopharyngeal Swab  Result Value Ref Range Status   SARS Coronavirus 2 NEGATIVE NEGATIVE Final    Comment: (NOTE) SARS-CoV-2 target nucleic acids are NOT DETECTED. The SARS-CoV-2 RNA is  generally detectable in upper and lower respiratory specimens during the acute phase of infection. Negative results do not preclude SARS-CoV-2 infection, do not rule out co-infections with other pathogens, and should not be used as the sole basis for treatment or other patient management decisions. Negative results must be combined with clinical observations, patient history, and epidemiological information. The expected result is Negative. Fact Sheet for Patients: SugarRoll.be Fact Sheet for Healthcare Providers: https://www.woods-mathews.com/ This test is not yet approved or cleared by the [place] FDA and  has been authorized for detection and/or diagnosis of SARS-CoV-2 by FDA under an Emergency Use Authorization (EUA). This EUA will remain  in effect (meaning this test can be used) for the duration of the COVID-19 declaration under Section 56 4(b)(1) of the Act, 21 U.S.C. section 360bbb-3(b)(1), unless the authorization is terminated or revoked sooner. Performed at [facility], [place] [address], [place], [place] [address]    Culture, Urine     Status: Abnormal   Collection Time: 06/18/19  1:58 PM   Specimen: Urine, Random  Result Value Ref Range Status   Specimen Description URINE, RANDOM  Final   Special Requests   Final    NONE Performed at [facility], [place] [address], [place], [place] [address]    Culture >=100,000 COLONIES/mL ENTEROBACTER CLOACAE (A)  Final   Report Status 06/20/2019 FINAL  Final   Organism ID, Bacteria ENTEROBACTER CLOACAE (A)  Final      Susceptibility   Enterobacter cloacae - MIC*    CEFAZOLIN >=64 RESISTANT Resistant     CIPROFLOXACIN <=0.25 SENSITIVE Sensitive     GENTAMICIN <=1 SENSITIVE Sensitive     IMIPENEM 1 SENSITIVE Sensitive     NITROFURANTOIN 64 INTERMEDIATE Intermediate     TRIMETH/SULFA <=20 SENSITIVE Sensitive     PIP/TAZO >=128 RESISTANT Resistant     * >=100,000 COLONIES/mL ENTEROBACTER CLOACAE     Imaging/Diagnostic Tests: No results found.  EKG Interpretation  Date/Time:  Tuesday June 17 2019 10:31:35 EST Ventricular Rate:  124 PR Interval:    QRS Duration: 71 QT Interval:  289 QTC Calculation: 415 R Axis:   34 Text Interpretation: Sinus tachycardia Probable left atrial enlargement When compared with ECG of 01/30/2019, Nonspecific ST abnormality is no longer present HEART RATE has increased Confirmed by Delora Fuel ([id number]) on 06/17/2019 11:43:37 PM        Procedures:    Lurline Del, DO 06/23/2019, 6:17 AM PGY-1, [place] Intern pager: [phone], text pages welcome

## 2019-06-23 NOTE — Progress Notes (Addendum)
Inpatient Diabetes Program Recommendations  AACE/ADA: New Consensus Statement on Inpatient Glycemic Control   Target Ranges:  Prepandial:   less than 140 mg/dL      Peak postprandial:   less than 180 mg/dL (1-2 hours)      Critically ill patients:  140 - 180 mg/dL   Results for [name], [name]" (MRN [id number]) as of 06/23/2019 09:39  Ref. Range 06/22/2019 07:41 06/22/2019 11:30 06/22/2019 16:29 06/22/2019 21:37 06/23/2019 07:41  Glucose-Capillary Latest Ref Range: 70 - 99 mg/dL 148 (H) 282 (H) 321 (H) 252 (H) 158 (H)  Results for [name] (MRN [id number]) as of 06/23/2019 09:39  Ref. Range 01/31/2019 04:26 06/17/2019 18:34  Hemoglobin A1C Latest Ref Range: 4.8 - 5.6 % 6.1 (H) 12.5 (H)   Review of Glycemic Control  Diabetes history: No Outpatient Diabetes medications: NA Current orders for Inpatient glycemic control: Lantus 25 units daily, Novolog 4 units TID with meals, Novolog 0-20 units TID with meals, Novolog 0-5 units QHS; Prednisone taper  Inpatient Diabetes Program Recommendations:   Insulin - Meal Coverage: If steroids are continued, please consider increasing meal coverage to Novolog 8 units TID with meals if patient eats at least 50% of meals.  HgbA1C: A1C 12.5% on 06/17/19 indicating an average glucose of 312 mg/dl. Prior A1C 6.1% on 01/31/19. Anticipate current A1C elevated due to steroids as patient has been on steroids since August 2020.  NOTE: Noted consult for Diabetes Coordinator. Per chart, patient does not have a pre-existing DM dx. Patient was inpatient 01/30/19 to 02/01/19 and was discharged on steroids and per H&P, patient has remained on steroids since then. A1C was 6.1% on 01/31/19 and has increased to 12.5% on 06/17/19. Anticipate A1C increased significantly due to steroids.  Patient is currently still ordered steroids and they are being tapered. Fasting glucose is trending well but post prandial is consistently elevated despite ordered meal coverage. Noted  steroids are being tapered. Recommend increasing meal coverage to improve post prandial glucose. Will order Living Well with DM, RD consult for diet education, and will plan to talk with patient about hyperglycemia with steroids and discuss A1C and DM (in case patient is dx with new DM).  Addendum 06/23/19_0 :19- Patient states that her PCP started her on Metformin about 1 week ago and she was told she had DM. Spoke with patient about new diabetes diagnosis.  Discussed A1C results (12.5% on 06/17/19) and explained what an A1C is and informed patient that current A1C indicates an average glucose of 312 mg/dl over the past 2-3 months. Discussed basic pathophysiology of DM Type 2, basic home care, importance of checking CBGs and maintaining good CBG control to prevent long-term and short-term complications. Reviewed glucose and A1C goals.  Explained that prior A1C on 01/31/19 was 6.1% and current A1C significantly higher due to being on steroids since August.  Reviewed signs and symptoms of hyperglycemia and hypoglycemia along with treatment for both. Patient confirms that she has been having symptoms of hyperglycemia for several weeks.  Discussed impact of nutrition, exercise, stress, sickness, and medications on diabetes control.  Reviewed Living Well with diabetes booklet and encouraged patient to read through entire book. Patient is hopeful that once she is able to come off steroids that she will be able to manage DM with lifestyle modifications and oral DM medications. Explained that while she is on steroids, she will likely need to take insulin.  Asked patient to check glucose 3-4 times per day (before meals and at  bedtime) and to keep a log book of glucose readings.  Explained how the doctor she follows up with can use the log book to continue to make adjustments with DM medications if needed. Patient states that she is not planning to continue seeing her current PCP and she is interested in getting a new PCP.  Asked CM to provide list of providers taking new patients. Patient reports that she will likely need to go to rehab before going back home.  Discussed current insulin orders (Lantus and Novolog) and explained how each insulin works for glycemic control.  Patient states that she does not see well and she would like to get a glucometer that talks (to verbalize glucose values) and she is interested in using an insulin pen since she can listen to the clicks (each click is 1 unit of insulin) and she feels she can see the number better on the insulin pen. Informed patient that an insulin pen starter kit would be ordered and encouraged to read through packet of information. Educated patient on insulin pen use at home.  Reviewed all steps of insulin pen including attachment of needle, 2-unit air shot, dialing up dose, giving injection, removing needle, disposal of sharps, storage of unused insulin, disposal of insulin etc. Patient able to provide successful return demonstration.  Patient verbalized understanding of information discussed and she states that she has no further questions at this time related to diabetes or insulin.   RNs to provide ongoing basic DM education at bedside with this patient and engage patient to actively check blood glucose and administer insulin injections.   Thanks, Barnie Alderman, RN, MSN, CDE Diabetes Coordinator Inpatient Diabetes Program [phone] (Team Pager from 8am to 5pm)

## 2019-06-23 NOTE — TOC Progression Note (Signed)
Transition of Care [facility]) - Progression Note    Patient Details  Name: NAKYA YONKO MRN: [id number] Date of Birth: [birth year]  Transition of Care [facility]) CM/SW [place], [place] Phone Number: 06/23/2019, 4:28 PM  Clinical Narrative:    CSW spoke with pt at bedside. We discussed SNF vs HH. Per pt the physicians had discussed [place] with her and she may be more interested in making that work at home. CSW provided her with [facility] agency list as well as list of SNF offers.   Pt and CSW discussed financial concerns regarding hospital bill- pt understands to call billing number and discuss payment plan with representative. Provided [place] DSS information to also look into applying for Medicaid.   Pt swabbed for COVID in case she does end up wanting to go to SNF. She also states she will review [facility] agency list as able this evening.    Expected Discharge Plan: [place] Barriers to Discharge: Continued Medical Work up, Ship broker  Expected Discharge Plan and Services Expected Discharge Plan: [place] Choice: [place] arrangements for the past 2 months: Single Family Home  Readmission Risk Interventions No flowsheet data found.

## 2019-06-23 NOTE — Plan of Care (Signed)
  Problem: Clinical Measurements: Goal: Ability to maintain clinical measurements within normal limits will improve Outcome: Progressing   Problem: Activity: Goal: Risk for activity intolerance will decrease Outcome: Progressing   

## 2019-06-23 NOTE — Progress Notes (Addendum)
Brief Nutrition Education Note  RD consulted for nutrition education regarding diabetes.   Lab Results  Component Value Date   HGBA1C 12.5 (H) 06/17/2019   PTA DM medications are none.   Labs reviewed: CBGS: 145-252 (inpatient orders for glycemic control are 0-20 units insulin aspart TID with meals, 0-5 units insulin aspart TID with meals, 4 units insulin aspart TID with meal,s and 30 units insulin glargine daily).   RD provided "Carbohydrate Counting for People with Diabetes" handout from the Academy of Nutrition and Dietetics. Handout attached to AVS/ discharge summary.   RD attempted to speak with pt, however, in with MD at this time of visit. Plan to d/c to SNF once medically stable. RD also referred to outpatient diabetes education through [facility]'s Nutrition and Diabetes Education Services for further reinforcement after discharge.   Body mass index is 40.46 kg/m. Pt meets criteria for extreme obesity, class III based on current BMI.  Current diet order is heart healthy/ carb modified, patient is consuming approximately 50-100% of meals at this time. Labs and medications reviewed. No further nutrition interventions warranted at this time. RD contact information provided. If additional nutrition issues arise, please re-consult RD.  Gopal Malter A. Jimmye Norman, RD, LDN, [place] Registered Dietitian II Certified Diabetes Care and Education Specialist Pager: [phone] After hours Pager: [phone]

## 2019-06-23 NOTE — Discharge Instructions (Addendum)
Please follow up wit hour PCP within one week of discharge.  Carbohydrate Counting For People With Diabetes  Why Is Carbohydrate Counting Important? . Counting carbohydrate servings may help you control your blood glucose level so that you feel better.  . The balance between the carbohydrates you eat and insulin determines what your blood glucose level will be after eating.  . Carbohydrate counting can also help you plan your meals. Which Foods Have Carbohydrates? Foods with carbohydrates include: . Breads, crackers, and cereals  . Pasta, rice, and grains  . Starchy vegetables, such as potatoes, corn, and peas  . Beans and legumes  . Milk, soy milk, and yogurt  . Fruits and fruit juices  . Sweets, such as cakes, cookies, ice cream, jam, and jelly Carbohydrate Servings In diabetes meal planning, 1 serving of a food with carbohydrate has about 15 grams of carbohydrate: . Check serving sizes with measuring cups and spoons or a food scale.  . Read the Nutrition Facts on food labels to find out how many grams of carbohydrate are in foods you eat. The food lists in this handout show portions that have about 15 grams of carbohydrate.  Tips Meal Planning Tips . An Eating Plan tells you how many carbohydrate servings to eat at your meals and snacks. For many adults, eating 3 to 5 servings of carbohydrate foods at each meal and 1 or 2 carbohydrate servings for each snack works well.  . In a healthy daily Eating Plan, most carbohydrates come from:  . At least 6 servings of fruits and nonstarchy vegetables  . At least 6 servings of grains, beans, and starchy vegetables, with at least 3 servings from whole grains  . At least 2 servings of milk or milk products . Check your blood glucose level regularly. It can tell you if you need to adjust when you eat carbohydrates.  . Eating foods that have fiber, such as whole grains, and having very few salty foods is good for your health.  . Eat 4 to 6  ounces of meat or other protein foods (such as soybean burgers) each day. Choose low-fat sources of protein, such as lean beef, lean pork, chicken, fish, low-fat cheese, or vegetarian foods such as soy.  . Eat some healthy fats, such as olive oil, canola oil, and nuts.  . Eat very little saturated fats. These unhealthy fats are found in butter, cream, and high-fat meats, such as bacon and sausage.  . Eat very little or no trans fats. These unhealthy fats are found in all foods that list "partially hydrogenated oil" as an ingredient.  Label Reading Tips The Nutrition Facts panel on a label lists the grams of total carbohydrate in 1 standard serving. The label's standard serving may be larger or smaller than 1 carbohydrate serving. To figure out how many carbohydrate servings are in the food: . First, look at the label's standard serving size.  Marland Kitchen Check the grams of total carbohydrate. This is the amount of carbohydrate in 1 standard serving.  . Divide the grams of total carbohydrate by 15. This number equals the number of carbohydrate servings in 1 standard serving. Remember: 1 carbohydrate serving is 15 grams of carbohydrate.  . Note: You may ignore the grams of sugars on the Nutrition Facts panel because they are included in the grams of total carbohydrate.  Foods Recommended 1 serving = about 15 grams of carbohydrate Starches . 1 slice bread (1 ounce)  . 1 tortilla (6-inch size)  .  large bagel (1 ounce)  . 2 taco shells (5-inch size)  .  hamburger or hot dog bun ( ounce)  .  cup ready-to-eat unsweetened cereal  .  cup cooked cereal  . 1 cup broth-based soup  . 4 to 6 small crackers  . 1/3 cup pasta or rice (cooked)  .  cup beans, peas, corn, sweet potatoes, winter squash, or mashed or boiled potatoes (cooked)  .  large baked potato (3 ounces)  .  ounce pretzels, potato chips, or tortilla chips  . 3 cups popcorn (popped) Fruit . 1 small fresh fruit ( to 1 cup)  .  cup canned  or frozen fruit  . 2 tablespoons dried fruit (blueberries, cherries, cranberries, mixed fruit, raisins)  . 17 small grapes (3 ounces)  . 1 cup melon or berries  .  cup unsweetened fruit juice Milk . 1 cup fat-free or reduced-fat milk  . 1 cup soy milk  . 2/3 cup (6 ounces) nonfat yogurt sweetened with sugar-free sweetener Sweets and Desserts . 2-inch square cake (unfrosted)  . 2 small cookies (2/3 ounce)  .  cup ice cream or frozen yogurt  .  cup sherbet or sorbet  . 1 tablespoon syrup, jam, jelly, table sugar, or honey  . 2 tablespoons light syrup Other Foods . Count 1 cup raw vegetables or  cup cooked nonstarchy vegetables as zero (0) carbohydrate servings or "free" foods. If you eat 3 or more servings at one meal, count them as 1 carbohydrate serving.  . Foods that have less than 20 calories in each serving also may be counted as zero carbohydrate servings or "free" foods.  . Count 1 cup of casserole or other mixed foods as 2 carbohydrate servings.  Carbohydrate Counting for People with Diabetes Sample 1-Day Menu  Breakfast 1 extra-small banana (1 carbohydrate serving)  1 cup low-fat or fat-free milk (1 carbohydrate serving)  1 slice whole wheat bread (1 carbohydrate serving)  1 teaspoon margarine  Lunch 2 ounces [place] slices  2 slices whole wheat bread (2 carbohydrate servings)  2 lettuce leaves  4 celery sticks  4 carrot sticks  1 medium apple (1 carbohydrate serving)  1 cup low-fat or fat-free milk (1 carbohydrate serving)  Afternoon Snack 2 tablespoons raisins (1 carbohydrate serving)  3/4 ounce unsalted mini pretzels (1 carbohydrate serving)  Evening Meal 3 ounces lean roast beef  1/2 large baked potato (2 carbohydrate servings)  1 tablespoon reduced-fat sour cream  1/2 cup green beans  1 tablespoon light salad dressing  1 whole wheat dinner roll (1 carbohydrate serving)  1 teaspoon margarine  1 cup melon balls (1 carbohydrate serving)  Evening Snack 2  tablespoons unsalted nuts   Carbohydrate Counting for People with Diabetes Vegan Sample 1-Day Menu  Breakfast 1 cup cooked oatmeal (2 carbohydrate servings)   cup blueberries (1 carbohydrate serving)  2 tablespoons flaxseeds  1 cup soymilk fortified with calcium and vitamin D  1 cup coffee  Lunch 2 slices whole wheat bread (2 carbohydrate servings)   cup baked tofu   cup lettuce  2 slices tomato  2 slices avocado   cup baby carrots  1 orange (1 carbohydrate serving)  1 cup soymilk fortified with calcium and vitamin D   Evening Meal Burrito made with: 1 6-inch corn tortilla (1 carbohydrate serving)  1 cup refried vegetarian beans (1 carbohydrate serving)   cup chopped tomatoes   cup lettuce   cup salsa  1/3 cup brown rice (1 carbohydrate  serving)  1 tablespoon olive oil for rice   cup zucchini   Evening Snack 6 small whole grain crackers (1 carbohydrate serving)  2 apricots ( carbohydrate serving)   cup unsalted peanuts ( carbohydrate serving)     Carbohydrate Counting for People with Diabetes Vegetarian (Lacto-Ovo) Sample 1-Day Menu  Breakfast 1 cup cooked oatmeal (2 carbohydrate servings)   cup blueberries (1 carbohydrate serving)  2 tablespoons flaxseeds  1 egg  1 cup 1% milk (1 carbohydrate serving)  1 cup coffee  Lunch 2 slices whole wheat bread (2 carbohydrate servings)  2 ounces low-fat cheese   cup lettuce  2 slices tomato  2 slices avocado   cup baby carrots  1 orange (1 carbohydrate serving)  1 cup unsweetened tea  Evening Meal Burrito made with: 1 6-inch corn tortilla (1 carbohydrate serving)   cup refried vegetarian beans (1 carbohydrate serving)   cup tomatoes   cup lettuce   cup salsa  1/3 cup brown rice (1 carbohydrate serving)  1 tablespoon olive oil for rice   cup zucchini  1 cup 1% milk (1 carbohydrate serving)  Evening Snack 6 small whole grain crackers (1 carbohydrate serving)  2 apricots ( carbohydrate serving)   cup  unsalted peanuts ( carbohydrate serving)    Copyright 2020  Academy of Nutrition and Dietetics. All rights reserved.  Using Nutrition Labels: Carbohydrate  . Serving Size  . Look at the serving size. All the information on the label is based on this portion. Randol Kern Per Container  . The number of servings contained in the package. . Guidelines for Carbohydrate  . Look at the total grams of carbohydrate in the serving size.  . 1 carbohydrate choice = 15 grams of carbohydrate. Range of Carbohydrate Grams Per Choice  Carbohydrate Grams/Choice Carbohydrate Choices  6-10   11-20 1  21-25 1  26-35 2  36-40 2  41-50 3  51-55 3  56-65 4  66-70 4  71-80 5    Copyright 2020  Academy of Nutrition and Dietetics. All rights reserved.

## 2019-06-23 NOTE — TOC Progression Note (Addendum)
Transition of Care [facility]) - Progression Note    Patient Details  Name: [name] MRN: [id number] Date of Birth: [birth year]  Transition of Care [facility]) CM/SW [place], [place] Phone Number: 06/23/2019, 12:35 PM  Clinical Narrative:    CSW has submitted for insurance authorization. Requested MD place an order for new COVID screen. F/u completed at [facility], await determination. Will provide pt with offers.    Expected Discharge Plan: [place] Barriers to Discharge: Continued Medical Work up, Ship broker  Expected Discharge Plan and Services Expected Discharge Plan: [place] Choice: [place] arrangements for the past 2 months: Single Family Home  Readmission Risk Interventions No flowsheet data found.

## 2019-06-24 LAB — GLUCOSE, CAPILLARY
Glucose-Capillary: 218 mg/dL — ABNORMAL HIGH (ref 70–99)
Glucose-Capillary: 165 mg/dL — ABNORMAL HIGH (ref 70–99)
Glucose-Capillary: 255 mg/dL — ABNORMAL HIGH (ref 70–99)
Glucose-Capillary: 117 mg/dL — ABNORMAL HIGH (ref 70–99)

## 2019-06-24 LAB — CBC
HCT: 36.1 % (ref 36.0–46.0)
Hemoglobin: 11.4 g/dL — ABNORMAL LOW (ref 12.0–15.0)
MCH: 32.1 pg (ref 26.0–34.0)
MCHC: 31.6 g/dL (ref 30.0–36.0)
MCV: 101.7 fL — ABNORMAL HIGH (ref 80.0–100.0)
Platelets: 274 10*3/uL (ref 150–400)
RBC: 3.55 MIL/uL — ABNORMAL LOW (ref 3.87–5.11)
RDW: 16.2 % — ABNORMAL HIGH (ref 11.5–15.5)
WBC: 7.5 10*3/uL (ref 4.0–10.5)
nRBC: 3.7 % — ABNORMAL HIGH (ref 0.0–0.2)

## 2019-06-24 LAB — BASIC METABOLIC PANEL
Anion gap: 10 (ref 5–15)
BUN: 15 mg/dL (ref 8–23)
CO2: 21 mmol/L — ABNORMAL LOW (ref 22–32)
Calcium: 9.4 mg/dL (ref 8.9–10.3)
Chloride: 108 mmol/L (ref 98–111)
Creatinine, Ser: 0.93 mg/dL (ref 0.44–1.00)
GFR calc Af Amer: >60 mL/min (ref >60)
GFR calc non Af Amer: >60 mL/min (ref >60)
Glucose, Bld: 158 mg/dL — ABNORMAL HIGH (ref 70–99)
Potassium: 4.7 mmol/L (ref 3.5–5.1)
Sodium: 139 mmol/L (ref 135–145)

## 2019-06-24 NOTE — Plan of Care (Signed)
  Problem: Activity: Goal: Risk for activity intolerance will decrease Outcome: Progressing   Problem: Nutrition: Goal: Adequate nutrition will be maintained Outcome: Not Applicable   Problem: Coping: Goal: Level of anxiety will decrease Outcome: Adequate for Discharge

## 2019-06-24 NOTE — Progress Notes (Signed)
Patient wearing CPAP when RT entered room. Patient tolerating CPAP settings well at this time. No distress noted. RT will monitor as needed.

## 2019-06-24 NOTE — Progress Notes (Signed)
Physical Therapy Treatment Patient Details Name: [name] MRN: [id number] DOB: July 18, 1945 Today's Date: 06/24/2019    History of Present Illness 74 yo female s/p fall with LLE Cellulitis with tachycardia PMH ADD, anxiety, arthritis, DM, obesity, hx of radiation, sleep apnea, ACDF 2017, breast lumpectomy, R TKA 2014    PT Comments    Continuing work on functional mobility and activity tolerance;  More fatigued today, but still very motivated and participating; continue to agree with SNF for post-acute rehab to maximize independence and safety with mobility prior to getting home   Follow Up Recommendations  SNF     Equipment Recommendations  None recommended by PT    Recommendations for Other Services       Precautions / Restrictions Precautions Precautions: Fall    Mobility  Bed Mobility Overal bed mobility: Needs Assistance Bed Mobility: Supine to Sit     Supine to sit: Min guard     General bed mobility comments: Minguard assist for safety; inefficient movement and HOB elevated, but did not need physical assist  Transfers Overall transfer level: Needs assistance Equipment used: Rolling walker (2 wheeled) Transfers: Sit to/from Stand Sit to Stand: Mod assist         General transfer comment: light mod assist to steady and power up  Ambulation/Gait Ambulation/Gait assistance: Min assist Gait Distance (Feet): 30 Feet Assistive device: Rolling walker (2 wheeled) Gait Pattern/deviations: Step-through pattern;Decreased step length - right;Decreased step length - left;Decreased stride length Gait velocity: slow   General Gait Details: Cues to self-monitor for activity tolerance; more fatigued today   Stairs             Wheelchair Mobility    Modified Rankin (Stroke Patients Only)       Balance                                            Cognition Arousal/Alertness: Awake/alert Behavior During Therapy: WFL for tasks  assessed/performed Overall Cognitive Status: Within Functional Limits for tasks assessed                                        Exercises      General Comments        Pertinent Vitals/Pain Pain Assessment: No/denies pain    Home Living                      Prior Function            PT Goals (current goals can now be found in the care plan section) Acute Rehab PT Goals Patient Stated Goal: Hopes to be able to go to [facility] for rehab PT Goal Formulation: With patient Time For Goal Achievement: 07/02/19 Potential to Achieve Goals: Good Progress towards PT goals: Not progressing toward goals - comment(limited by fatigue)    Frequency    Min 2X/week      PT Plan Current plan remains appropriate    Co-evaluation              AM-PAC PT "6 Clicks" Mobility   Outcome Measure  Help needed turning from your back to your side while in a flat bed without using bedrails?: None Help needed moving from lying on your back to sitting on the side  of a flat bed without using bedrails?: A Little Help needed moving to and from a bed to a chair (including a wheelchair)?: A Little Help needed standing up from a chair using your arms (e.g., wheelchair or bedside chair)?: A Little Help needed to walk in hospital room?: A Little Help needed climbing 3-5 steps with a railing? : A Lot 6 Click Score: 18    End of Session Equipment Utilized During Treatment: Gait belt Activity Tolerance: Patient tolerated treatment well Patient left: in chair;with call bell/phone within reach;with chair alarm set Nurse Communication: Mobility status PT Visit Diagnosis: Muscle weakness (generalized) (M62.81);Other abnormalities of gait and mobility (R26.89)     Time: 0943-1000 PT Time Calculation (min) (ACUTE ONLY): 17 min  Charges:  $Gait Training: 8-22 mins                     Roney Marion, [place]  Acute Rehabilitation Services Pager [phone] Office  [phone]    [name] 06/24/2019, 3:01 PM

## 2019-06-24 NOTE — Progress Notes (Signed)
Family Medicine Teaching Service Daily Progress Note Intern Pager: [phone]  Patient name: [name] Medical record number: [id number] Date of birth: 1945/12/01 Age: 74 y.o. Gender: female  Primary Care Provider: Leonard Downing, MD Consultants: CONSULT FOR UNASSIGNED MEDICAL ADMISSION CONSULT TO TRANSITION OF CARE TEAM CONSULT TO DIABETES COORDINATOR CONSULT TO DIETITIAN Code Status: Full Code   Pt Overview and Major Events to Date:  Hospital Day: 8 06/17/2019: admitted for Weakness 06/17/19: LE extremity DVT US negative, CXR w/ low lung volumes, Neck US w/o abnormality  06/17/19: 1g CTX x 1, Keflex 500 mg q 12 hours (12/30-1/1)  06/18/19: Fluconazole (12/30 - 06/28/19)  06/18/19: MRI lumbar and thoracic, CT soft tissue  06/20/19: Bactrim (1/1-1/3)  Assessment and Plan: [name] is a 75 y.o. female who presented w/ weakness.  PMHx s/f obesity, hyperglycemia 2/2 prednisone, chronic headaches/migraines, HLD, C8 radiculopathy, bladder and bowel incontinence, OSA  Generalized Weakness Continuing to improve, most likely due to iatrogenic adrenal insufficiency and polypharmacy.  Patient states she has been able to move around her room a little bit including to bedside commode. Confusion also seems to be improving with patient currently A&O x3.  UDS was negative; therefore no recent oxycodone-acetominophen or benzodiazepines leading to weakness. -PT/OT recommends SNF, patient is amendable and preferred SNF in [place]. -Tizanidine taper -currently at 2 mg twice daily, recommend in two days go to 2mg  QHS PRN if tolerated -Discontinue allegra outpatient  -Taper prednisone as below:  Updated prednisone taper: 80 mg, 60 mg for 2 days, 40 mg for 2 days, 20 mg for 7 days, 10 mg for 7 days, 5 mg for 2 weeks, 4 mg for 2 weeks, 3 mg for 2 weeks, 2 mg for 2 weeks, 1 mg for 2 weeks.  At this time PCP will determine remainder of prednisone taper.  UTI >100k enterobacter cloacae,  resistant to keflex, zosyn and intermediate to macrobid. UTI presented as uncomplicated and will treat as such.  -Status post bactrim BID (1/1-1/3) -Status post Keflex -Bedside commode   Chronic Left LEE Taking lasix 80 mg daily at home (Rx'ed as 1-2 tabs PRN).  She reports taking this for years.  Weight on admission 112 kg, currently 106.9kg. - Daily weights  - Can restart lasix if needed (consider lower dose)   Long term prednisone Use  Hyperglycema  Type II DM   No known diabetes prior to prednisone use. Currently on Lantus 25units and severe resistance SSI w/ QHS correction.  Fasting CBG 158 this morning, 06/23/19.  Range between 148 and 321 in last 24 hours and required 36units of novolog.  - Continue resistant sliding scale  -Continue Lantus 30 units - Monitor with prednisone dose   Dysphagia  GERD, Hx Barrets  Esophagitis 2/2 candida  Patient states dysphagia is improving and that she was able to eat breakfast this morning. -Continue fluconazole (total tx 21 days, initial load with 400 mg, then 200 mg daily).  - Day 6 of 21 fluconazole   - Continue PTX daily  Cellulitis  Resolved.    CKD stage 1 Current creatinine 1.22.  Current GFR estimated 44. - Cont holding lasix    OSA  - Nightly CPAP  HLD  AST/ALT with only mild elevation.  - Cont statin   Incidental Findings on CT  Mediastinal LAD  Retroperitoneal LAD  Hx of radiation therapy   -Follow up outpatient   Neck mass  No findings on ultrasound. No findings on CT w/ contrast soft tissue  neck. No further work up. Patient reports this started after her cervical surgery which she reports was an anterior procedure.   Monitor and Follow up outpatient  -NAFLD  transaminitis  Elevated GGT  -Chronic Headaches  -Chronic Neck Pain  Hx of anterior cervical Decompression C3-C7  Cervical Radiculopathy at C8 -Depression -Sjorgens?  -Hx of Bowel and Bladder Incontinence: MRI spine negative for cauda equina syndrome.    FEN/GI:  . Fluids: Saline lock . Electrolytes: none  . Nutrition: HH/CM   Access:  VTE prophylaxis: Lovenox 40 (CrCl>30)  Disposition: Medically stable for discharge to SNF  Subjective:  Patient without complaints this morning.  Continues to be medically stable for discharge to SNF.  Objective: Temp:  [97.5 F (36.4 C)-98.2 F (36.8 C)] 97.5 F (36.4 C) (01/05 0453) Pulse Rate:  [98-103] 102 (01/05 0453) Resp:  [16-18] 18 (01/05 0453) BP: (130-144)/(82-89) 130/82 (01/05 0453) SpO2:  [98 %-99 %] 98 % (01/05 0453) Weight:  [105.5 kg] 105.5 kg (01/05 0453) Intake/Output      01/04 0701 - 01/05 0700   P.O. 1840   Total Intake(mL/kg) 1840 (17.4)   Urine (mL/kg/hr) 1950 (0.8)   Total Output 1950   Net -110       Urine Occurrence 3 x       Physical Exam: General: Alert and oriented x3 and in no apparent distress Heart: Regular rate and rhythm with no murmurs appreciated Lungs: CTA bilaterally, no wheezing Abdomen: Bowel sounds present, no abdominal pain  Laboratory: I have personally read and reviewed all labs and imaging studies.  CBC: Recent Labs  Lab 06/17/19 1248 06/17/19 1253 06/21/19 0232 06/22/19 0155 06/23/19 0646  WBC 15.6*   < > 7.2 8.4 9.4  NEUTROABS 12.8*  --   --   --   --   HGB 13.8  --  11.8* 12.0 12.1  HCT 42.2  --  35.7* 36.4 38.7  MCV 97.2   < > 97.5 97.6 102.7*  PLT 236   < > 201 267 296   < > = values in this interval not displayed.   CMP: Recent Labs  Lab 06/17/19 1248 06/18/19 0147 06/19/19 0759 06/21/19 0232 06/23/19 0517  NA 134* 135 137 135 138  K 4.1 3.3* 3.3* 4.5 4.9  CL 91* 92* 99 103 106  CO2 25 27 28 22 22   GLUCOSE 321* 221* 175* 252* 141*  BUN 23 16 12 14 15   CREATININE 0.89 1.04* 0.86 0.86 1.22*  CALCIUM 9.7 9.3 8.8* 9.1 9.3  MG 2.1  --   --   --   --   PHOS 2.4*  --  2.5  --   --   ALBUMIN 2.7* 2.6* 2.0* 2.2*  --    CBG: Recent Labs  Lab 06/22/19 2137 06/23/19 0741 06/23/19 1202 06/23/19 1709  06/23/19 2116  GLUCAP 252* 158* 145* 302* 237*   Micro: Covid Negative  Recent Results (from the past 240 hour(s))  SARS CORONAVIRUS 2 (TAT 6-24 HRS) Nasopharyngeal Nasopharyngeal Swab     Status: None   Collection Time: 06/17/19 12:31 PM   Specimen: Nasopharyngeal Swab  Result Value Ref Range Status   SARS Coronavirus 2 NEGATIVE NEGATIVE Final    Comment: (NOTE) SARS-CoV-2 target nucleic acids are NOT DETECTED. The SARS-CoV-2 RNA is generally detectable in upper and lower respiratory specimens during the acute phase of infection. Negative results do not preclude SARS-CoV-2 infection, do not rule out co-infections with other pathogens, and should not be used  as the sole basis for treatment or other patient management decisions. Negative results must be combined with clinical observations, patient history, and epidemiological information. The expected result is Negative. Fact Sheet for Patients: SugarRoll.be Fact Sheet for Healthcare Providers: https://www.woods-mathews.com/ This test is not yet approved or cleared by the [place] FDA and  has been authorized for detection and/or diagnosis of SARS-CoV-2 by FDA under an Emergency Use Authorization (EUA). This EUA will remain  in effect (meaning this test can be used) for the duration of the COVID-19 declaration under Section 56 4(b)(1) of the Act, 21 U.S.C. section 360bbb-3(b)(1), unless the authorization is terminated or revoked sooner. Performed at [facility], [place] [address] [place], [place] [address]   Culture, Urine     Status: Abnormal   Collection Time: 06/18/19  1:58 PM   Specimen: Urine, Random  Result Value Ref Range Status   Specimen Description URINE, RANDOM  Final   Special Requests   Final    NONE Performed at [facility], [place] [address], [place], [place] [address]    Culture >=100,000 COLONIES/mL ENTEROBACTER CLOACAE (A)  Final   Report Status  06/20/2019 FINAL  Final   Organism ID, Bacteria ENTEROBACTER CLOACAE (A)  Final      Susceptibility   Enterobacter cloacae - MIC*    CEFAZOLIN >=64 RESISTANT Resistant     CIPROFLOXACIN <=0.25 SENSITIVE Sensitive     GENTAMICIN <=1 SENSITIVE Sensitive     IMIPENEM 1 SENSITIVE Sensitive     NITROFURANTOIN 64 INTERMEDIATE Intermediate     TRIMETH/SULFA <=20 SENSITIVE Sensitive     PIP/TAZO >=128 RESISTANT Resistant     * >=100,000 COLONIES/mL ENTEROBACTER CLOACAE  SARS CORONAVIRUS 2 (TAT 6-24 HRS) Nasopharyngeal Nasopharyngeal Swab     Status: None   Collection Time: 06/23/19  4:31 PM   Specimen: Nasopharyngeal Swab  Result Value Ref Range Status   SARS Coronavirus 2 NEGATIVE NEGATIVE Final    Comment: (NOTE) SARS-CoV-2 target nucleic acids are NOT DETECTED. The SARS-CoV-2 RNA is generally detectable in upper and lower respiratory specimens during the acute phase of infection. Negative results do not preclude SARS-CoV-2 infection, do not rule out co-infections with other pathogens, and should not be used as the sole basis for treatment or other patient management decisions. Negative results must be combined with clinical observations, patient history, and epidemiological information. The expected result is Negative. Fact Sheet for Patients: SugarRoll.be Fact Sheet for Healthcare Providers: https://www.woods-mathews.com/ This test is not yet approved or cleared by the [place] FDA and  has been authorized for detection and/or diagnosis of SARS-CoV-2 by FDA under an Emergency Use Authorization (EUA). This EUA will remain  in effect (meaning this test can be used) for the duration of the COVID-19 declaration under Section 56 4(b)(1) of the Act, 21 U.S.C. section 360bbb-3(b)(1), unless the authorization is terminated or revoked sooner. Performed at [facility], [place] [address], [place], [place] [address]      Imaging/Diagnostic  Tests: No results found.  EKG Interpretation  Date/Time:  Tuesday June 17 2019 10:31:35 EST Ventricular Rate:  124 PR Interval:    QRS Duration: 71 QT Interval:  289 QTC Calculation: 415 R Axis:   34 Text Interpretation: Sinus tachycardia Probable left atrial enlargement When compared with ECG of 01/30/2019, Nonspecific ST abnormality is no longer present HEART RATE has increased Confirmed by Delora Fuel ([id number]) on 06/17/2019 11:43:37 PM        Procedures:  Lurline Del, DO 06/24/2019, 6:32 AM PGY-1, [place] Intern pager: [phone], text pages welcome

## 2019-06-24 NOTE — Plan of Care (Signed)
  Problem: Pain Managment: Goal: General experience of comfort will improve Outcome: Progressing   Problem: Safety: Goal: Ability to remain free from injury will improve Outcome: Progressing   Problem: Skin Integrity: Goal: Risk for impaired skin integrity will decrease Outcome: Progressing   

## 2019-06-24 NOTE — TOC Progression Note (Addendum)
Transition of Care [facility]) - Progression Note    Patient Details  Name: [name] MRN: [id number] Date of Birth: 1946-06-07  Transition of Care [facility]) CM/SW [place], [place] Phone Number: 06/24/2019, 10:22 AM  Clinical Narrative:    1:02pm- CSW received confirmation that facility can accept pt, they have her approved for tomorrow. Blue Medicare Josem Kaufmann [id number] -good for 3 days. Provided auth information to [place] at [facility]. MD secure chat sent with approval and discharge timing.   10:22am- CSW spoke with [facility], understand pt still interested in [facility] as top choice. RN staff at [facility] is reviewing. Insurance pending facility choice. Continue to follow.    Expected Discharge Plan: [place] Barriers to Discharge: Continued Medical Work up, Ship broker  Expected Discharge Plan and Services Expected Discharge Plan: [place] Choice: [place] arrangements for the past 2 months: Single Family Home   Readmission Risk Interventions No flowsheet data found.

## 2019-06-25 DIAGNOSIS — R5381 Other malaise: Secondary | ICD-10-CM | POA: Diagnosis not present

## 2019-06-25 DIAGNOSIS — R531 Weakness: Secondary | ICD-10-CM | POA: Diagnosis not present

## 2019-06-25 DIAGNOSIS — L03116 Cellulitis of left lower limb: Secondary | ICD-10-CM | POA: Diagnosis not present

## 2019-06-25 DIAGNOSIS — M2550 Pain in unspecified joint: Secondary | ICD-10-CM | POA: Diagnosis not present

## 2019-06-25 DIAGNOSIS — Z7401 Bed confinement status: Secondary | ICD-10-CM | POA: Diagnosis not present

## 2019-06-25 DIAGNOSIS — Z20828 Contact with and (suspected) exposure to other viral communicable diseases: Secondary | ICD-10-CM | POA: Diagnosis not present

## 2019-06-25 DIAGNOSIS — R221 Localized swelling, mass and lump, neck: Secondary | ICD-10-CM | POA: Diagnosis not present

## 2019-06-25 DIAGNOSIS — E242 Drug-induced Cushing's syndrome: Secondary | ICD-10-CM | POA: Diagnosis not present

## 2019-06-25 DIAGNOSIS — R7989 Other specified abnormal findings of blood chemistry: Secondary | ICD-10-CM | POA: Diagnosis not present

## 2019-06-25 DIAGNOSIS — E1165 Type 2 diabetes mellitus with hyperglycemia: Secondary | ICD-10-CM | POA: Diagnosis not present

## 2019-06-25 DIAGNOSIS — L89620 Pressure ulcer of left heel, unstageable: Secondary | ICD-10-CM | POA: Diagnosis not present

## 2019-06-25 DIAGNOSIS — Z79899 Other long term (current) drug therapy: Secondary | ICD-10-CM | POA: Diagnosis not present

## 2019-06-25 LAB — SARS CORONAVIRUS 2 (TAT 6-24 HRS): SARS Coronavirus 2: NEGATIVE

## 2019-06-25 LAB — CBC
HCT: 36.6 % (ref 36.0–46.0)
Hemoglobin: 11.8 g/dL — ABNORMAL LOW (ref 12.0–15.0)
MCH: 32.5 pg (ref 26.0–34.0)
MCHC: 32.2 g/dL (ref 30.0–36.0)
MCV: 100.8 fL — ABNORMAL HIGH (ref 80.0–100.0)
Platelets: 308 10*3/uL (ref 150–400)
RBC: 3.63 MIL/uL — ABNORMAL LOW (ref 3.87–5.11)
RDW: 16.4 % — ABNORMAL HIGH (ref 11.5–15.5)
WBC: 8.8 10*3/uL (ref 4.0–10.5)
nRBC: 4.4 % — ABNORMAL HIGH (ref 0.0–0.2)

## 2019-06-25 LAB — GLUCOSE, CAPILLARY
Glucose-Capillary: 83 mg/dL (ref 70–99)
Glucose-Capillary: 197 mg/dL — ABNORMAL HIGH (ref 70–99)

## 2019-06-25 LAB — PATHOLOGIST SMEAR REVIEW

## 2019-06-25 MED ORDER — PREDNISONE 5 MG PO TABS: 5.0000 mg | ORAL_TABLET | Freq: Every day | ORAL | 0 refills | Status: AC

## 2019-06-25 MED ORDER — PREDNISONE 1 MG PO TABS: 2.0000 mg | ORAL_TABLET | Freq: Every day | ORAL | 0 refills | Status: AC

## 2019-06-25 MED ORDER — INSULIN GLARGINE 100 UNIT/ML ~~LOC~~ SOLN: 30.0000 [IU] | Freq: Every day | SUBCUTANEOUS | 0 refills | Status: DC

## 2019-06-25 MED ORDER — INSULIN ASPART 100 UNIT/ML ~~LOC~~ SOLN: 0.0000 [IU] | Freq: Three times a day (TID) | SUBCUTANEOUS | 11 refills | Status: DC

## 2019-06-25 MED ORDER — PREDNISONE 10 MG PO TABS: 10.0000 mg | ORAL_TABLET | Freq: Every day | ORAL | 0 refills | Status: AC

## 2019-06-25 MED ORDER — PREDNISONE 20 MG PO TABS: 20.0000 mg | ORAL_TABLET | Freq: Every day | ORAL | 0 refills | Status: DC

## 2019-06-25 MED ORDER — PREDNISONE 1 MG PO TABS: 3.0000 mg | ORAL_TABLET | Freq: Every day | ORAL | 0 refills | Status: AC

## 2019-06-25 MED ORDER — INSULIN ASPART 100 UNIT/ML ~~LOC~~ SOLN
0.0000 [IU] | Freq: Three times a day (TID) | SUBCUTANEOUS | Status: DC
  Administered 2019-06-25: 2 [IU] via SUBCUTANEOUS

## 2019-06-25 MED ORDER — PREDNISONE 1 MG PO TABS: 4.0000 mg | ORAL_TABLET | Freq: Every day | ORAL | 0 refills | Status: AC

## 2019-06-25 MED ORDER — FLUCONAZOLE 200 MG PO TABS: 200.0000 mg | ORAL_TABLET | Freq: Every day | ORAL | 0 refills | Status: AC

## 2019-06-25 MED ORDER — PREDNISONE 1 MG PO TABS: 1.0000 mg | ORAL_TABLET | Freq: Every day | ORAL | 0 refills | Status: AC

## 2019-06-25 NOTE — Progress Notes (Signed)
Pt transferred to [facility] via [place], no s/s of distress noted, given all her personal belongings.

## 2019-06-25 NOTE — Progress Notes (Signed)
Pt given report to [facility] c/o [place], pt had covid test negative, flu shot 2 months ago and pneumovaccine 2 years ago, full code, transport is coming possible at 1330.

## 2019-06-25 NOTE — Social Work (Signed)
Clinical Social Worker facilitated patient discharge including contacting patient family and facility to confirm patient discharge plans.  Clinical information faxed to facility and family agreeable with plan.  CSW arranged ambulance transport via [facility] to [place] RN to call [phone] ext [phone]  with report prior to discharge.  Clinical Social Worker will sign off for now as social work intervention is no longer needed. Please consult [place] again if new need arises.  Westley Hummer, MSW, [place] Social Worker

## 2019-06-25 NOTE — Care Management Important Message (Signed)
Important Message  Patient Details  Name: [name] MRN: [id number] Date of Birth: 1946-02-06   Medicare Important Message Given:  Yes     Shelda Altes 06/25/2019, 2:59 PM

## 2019-06-25 NOTE — Plan of Care (Signed)
Pt for discharge today going to [facility], alert and oriented, on bi-pap, saline lock, able to tolerate her meals, she use the bedside commode with 1x assist, room air, skin intact, no complain of pain, waiting for [facility] .

## 2019-06-25 NOTE — TOC Transition Note (Signed)
Transition of Care [facility]) - CM/SW Discharge Note   Patient Details  Name: [name] MRN: [id number] Date of Birth: 01-28-1946  Transition of Care [facility]) CM/SW Contact:  Alexander Mt, [place] Phone Number: 06/25/2019, 11:30 AM   Clinical Narrative:    Pt discharge information completed per MD. Pt lunch delivered to room. Pt stable for discharge, PTAR papers on chart. Will fax discharge summary to [phone]. RN aware and will call report when room number available. Pt family to bring cpap to SNF.   Final next level of care: Skilled Nursing Facility Barriers to Discharge: Barriers Resolved   Patient Goals and CMS Choice Patient states their goals for this hospitalization and ongoing recovery are:: to go to rehab and get stronger CMS Medicare.gov Compare Post Acute Care list provided to:: Patient Choice offered to / list presented to : Patient  Discharge Placement   Existing PASRR number confirmed : 06/19/19          Patient chooses bed at: [facility], [place] Patient to be transferred to facility by: [place] Name of family member notified: pt will notify spouse and daughter Patient and family notified of of transfer: 06/25/19  Discharge Plan and Services Post Acute Care Choice: [place]            Readmission Risk Interventions No flowsheet data found.

## 2019-07-01 DIAGNOSIS — L89620 Pressure ulcer of left heel, unstageable: Secondary | ICD-10-CM | POA: Diagnosis not present

## 2019-07-09 DIAGNOSIS — E1165 Type 2 diabetes mellitus with hyperglycemia: Secondary | ICD-10-CM | POA: Diagnosis not present

## 2019-07-09 DIAGNOSIS — D649 Anemia, unspecified: Secondary | ICD-10-CM | POA: Diagnosis not present

## 2019-07-09 DIAGNOSIS — M316 Other giant cell arteritis: Secondary | ICD-10-CM | POA: Diagnosis not present

## 2019-07-09 DIAGNOSIS — R609 Edema, unspecified: Secondary | ICD-10-CM | POA: Diagnosis not present

## 2019-07-30 DIAGNOSIS — E1165 Type 2 diabetes mellitus with hyperglycemia: Secondary | ICD-10-CM | POA: Diagnosis not present

## 2019-07-30 DIAGNOSIS — M316 Other giant cell arteritis: Secondary | ICD-10-CM | POA: Diagnosis not present

## 2019-07-30 DIAGNOSIS — R519 Headache, unspecified: Secondary | ICD-10-CM | POA: Diagnosis not present

## 2019-07-30 DIAGNOSIS — G8929 Other chronic pain: Secondary | ICD-10-CM | POA: Diagnosis not present

## 2019-08-15 DIAGNOSIS — R200 Anesthesia of skin: Secondary | ICD-10-CM | POA: Diagnosis not present

## 2019-09-25 DIAGNOSIS — M5412 Radiculopathy, cervical region: Secondary | ICD-10-CM | POA: Diagnosis not present

## 2019-10-15 DIAGNOSIS — M5412 Radiculopathy, cervical region: Secondary | ICD-10-CM | POA: Diagnosis not present

## 2019-10-28 DIAGNOSIS — G720 Drug-induced myopathy: Secondary | ICD-10-CM | POA: Diagnosis not present

## 2019-10-28 DIAGNOSIS — E782 Mixed hyperlipidemia: Secondary | ICD-10-CM | POA: Diagnosis not present

## 2019-10-28 DIAGNOSIS — R1010 Upper abdominal pain, unspecified: Secondary | ICD-10-CM | POA: Diagnosis not present

## 2019-10-29 ENCOUNTER — Other Ambulatory Visit: Payer: Self-pay

## 2019-10-29 ENCOUNTER — Encounter (HOSPITAL_COMMUNITY): Payer: Self-pay | Admitting: Internal Medicine

## 2019-10-29 ENCOUNTER — Inpatient Hospital Stay (HOSPITAL_COMMUNITY)
Admission: EM | Admit: 2019-10-29 | Discharge: 2019-11-20 | DRG: 374 | Disposition: A | Discharge status: Hospice | Payer: Medicare Other | Source: Ambulatory Visit | Attending: Internal Medicine | Admitting: Internal Medicine

## 2019-10-29 ENCOUNTER — Emergency Department (HOSPITAL_COMMUNITY): Payer: Medicare Other

## 2019-10-29 DIAGNOSIS — R54 Age-related physical debility: Secondary | ICD-10-CM | POA: Diagnosis present

## 2019-10-29 DIAGNOSIS — R5381 Other malaise: Secondary | ICD-10-CM | POA: Diagnosis not present

## 2019-10-29 DIAGNOSIS — B370 Candidal stomatitis: Secondary | ICD-10-CM | POA: Diagnosis not present

## 2019-10-29 DIAGNOSIS — M5412 Radiculopathy, cervical region: Secondary | ICD-10-CM | POA: Diagnosis present

## 2019-10-29 DIAGNOSIS — Z853 Personal history of malignant neoplasm of breast: Secondary | ICD-10-CM | POA: Diagnosis not present

## 2019-10-29 DIAGNOSIS — F988 Other specified behavioral and emotional disorders with onset usually occurring in childhood and adolescence: Secondary | ICD-10-CM | POA: Diagnosis present

## 2019-10-29 DIAGNOSIS — E8809 Other disorders of plasma-protein metabolism, not elsewhere classified: Secondary | ICD-10-CM | POA: Diagnosis not present

## 2019-10-29 DIAGNOSIS — D702 Other drug-induced agranulocytosis: Secondary | ICD-10-CM

## 2019-10-29 DIAGNOSIS — Z20822 Contact with and (suspected) exposure to covid-19: Secondary | ICD-10-CM | POA: Diagnosis not present

## 2019-10-29 DIAGNOSIS — Z801 Family history of malignant neoplasm of trachea, bronchus and lung: Secondary | ICD-10-CM

## 2019-10-29 DIAGNOSIS — Z794 Long term (current) use of insulin: Secondary | ICD-10-CM | POA: Diagnosis not present

## 2019-10-29 DIAGNOSIS — I96 Gangrene, not elsewhere classified: Secondary | ICD-10-CM | POA: Diagnosis present

## 2019-10-29 DIAGNOSIS — E1165 Type 2 diabetes mellitus with hyperglycemia: Secondary | ICD-10-CM | POA: Diagnosis not present

## 2019-10-29 DIAGNOSIS — A4151 Sepsis due to Escherichia coli [E. coli]: Secondary | ICD-10-CM | POA: Diagnosis not present

## 2019-10-29 DIAGNOSIS — N179 Acute kidney failure, unspecified: Secondary | ICD-10-CM | POA: Diagnosis not present

## 2019-10-29 DIAGNOSIS — D61818 Other pancytopenia: Secondary | ICD-10-CM | POA: Diagnosis not present

## 2019-10-29 DIAGNOSIS — E860 Dehydration: Secondary | ICD-10-CM | POA: Diagnosis present

## 2019-10-29 DIAGNOSIS — C786 Secondary malignant neoplasm of retroperitoneum and peritoneum: Secondary | ICD-10-CM | POA: Diagnosis not present

## 2019-10-29 DIAGNOSIS — E871 Hypo-osmolality and hyponatremia: Secondary | ICD-10-CM | POA: Diagnosis not present

## 2019-10-29 DIAGNOSIS — G8929 Other chronic pain: Secondary | ICD-10-CM | POA: Diagnosis present

## 2019-10-29 DIAGNOSIS — R197 Diarrhea, unspecified: Secondary | ICD-10-CM | POA: Diagnosis present

## 2019-10-29 DIAGNOSIS — C799 Secondary malignant neoplasm of unspecified site: Secondary | ICD-10-CM | POA: Diagnosis present

## 2019-10-29 DIAGNOSIS — B964 Proteus (mirabilis) (morganii) as the cause of diseases classified elsewhere: Secondary | ICD-10-CM | POA: Diagnosis not present

## 2019-10-29 DIAGNOSIS — R188 Other ascites: Secondary | ICD-10-CM | POA: Diagnosis not present

## 2019-10-29 DIAGNOSIS — Z170 Estrogen receptor positive status [ER+]: Secondary | ICD-10-CM

## 2019-10-29 DIAGNOSIS — C569 Malignant neoplasm of unspecified ovary: Secondary | ICD-10-CM | POA: Diagnosis not present

## 2019-10-29 DIAGNOSIS — K219 Gastro-esophageal reflux disease without esophagitis: Secondary | ICD-10-CM | POA: Diagnosis present

## 2019-10-29 DIAGNOSIS — R630 Anorexia: Secondary | ICD-10-CM | POA: Diagnosis present

## 2019-10-29 DIAGNOSIS — L304 Erythema intertrigo: Secondary | ICD-10-CM

## 2019-10-29 DIAGNOSIS — Z859 Personal history of malignant neoplasm, unspecified: Secondary | ICD-10-CM | POA: Diagnosis not present

## 2019-10-29 DIAGNOSIS — R7989 Other specified abnormal findings of blood chemistry: Secondary | ICD-10-CM | POA: Diagnosis not present

## 2019-10-29 DIAGNOSIS — E242 Drug-induced Cushing's syndrome: Secondary | ICD-10-CM | POA: Diagnosis present

## 2019-10-29 DIAGNOSIS — Z7189 Other specified counseling: Secondary | ICD-10-CM | POA: Diagnosis not present

## 2019-10-29 DIAGNOSIS — K7200 Acute and subacute hepatic failure without coma: Secondary | ICD-10-CM | POA: Diagnosis not present

## 2019-10-29 DIAGNOSIS — Z87891 Personal history of nicotine dependence: Secondary | ICD-10-CM

## 2019-10-29 DIAGNOSIS — T451X5A Adverse effect of antineoplastic and immunosuppressive drugs, initial encounter: Secondary | ICD-10-CM | POA: Diagnosis not present

## 2019-10-29 DIAGNOSIS — G43909 Migraine, unspecified, not intractable, without status migrainosus: Secondary | ICD-10-CM | POA: Diagnosis present

## 2019-10-29 DIAGNOSIS — K760 Fatty (change of) liver, not elsewhere classified: Secondary | ICD-10-CM | POA: Diagnosis present

## 2019-10-29 DIAGNOSIS — Z515 Encounter for palliative care: Secondary | ICD-10-CM

## 2019-10-29 DIAGNOSIS — K7290 Hepatic failure, unspecified without coma: Secondary | ICD-10-CM | POA: Diagnosis not present

## 2019-10-29 DIAGNOSIS — C7889 Secondary malignant neoplasm of other digestive organs: Secondary | ICD-10-CM | POA: Diagnosis not present

## 2019-10-29 DIAGNOSIS — K769 Liver disease, unspecified: Secondary | ICD-10-CM

## 2019-10-29 DIAGNOSIS — T380X5A Adverse effect of glucocorticoids and synthetic analogues, initial encounter: Secondary | ICD-10-CM | POA: Diagnosis present

## 2019-10-29 DIAGNOSIS — Z96651 Presence of right artificial knee joint: Secondary | ICD-10-CM | POA: Diagnosis present

## 2019-10-29 DIAGNOSIS — R601 Generalized edema: Secondary | ICD-10-CM | POA: Diagnosis not present

## 2019-10-29 DIAGNOSIS — R160 Hepatomegaly, not elsewhere classified: Secondary | ICD-10-CM

## 2019-10-29 DIAGNOSIS — A419 Sepsis, unspecified organism: Secondary | ICD-10-CM | POA: Diagnosis not present

## 2019-10-29 DIAGNOSIS — Z923 Personal history of irradiation: Secondary | ICD-10-CM

## 2019-10-29 DIAGNOSIS — R7401 Elevation of levels of liver transaminase levels: Secondary | ICD-10-CM

## 2019-10-29 DIAGNOSIS — Z803 Family history of malignant neoplasm of breast: Secondary | ICD-10-CM

## 2019-10-29 DIAGNOSIS — D61810 Antineoplastic chemotherapy induced pancytopenia: Secondary | ICD-10-CM | POA: Diagnosis not present

## 2019-10-29 DIAGNOSIS — Z66 Do not resuscitate: Secondary | ICD-10-CM

## 2019-10-29 DIAGNOSIS — E875 Hyperkalemia: Secondary | ICD-10-CM | POA: Diagnosis not present

## 2019-10-29 DIAGNOSIS — R0603 Acute respiratory distress: Secondary | ICD-10-CM | POA: Diagnosis not present

## 2019-10-29 DIAGNOSIS — L89152 Pressure ulcer of sacral region, stage 2: Secondary | ICD-10-CM | POA: Diagnosis present

## 2019-10-29 DIAGNOSIS — Z6841 Body Mass Index (BMI) 40.0 and over, adult: Secondary | ICD-10-CM | POA: Diagnosis not present

## 2019-10-29 DIAGNOSIS — E876 Hypokalemia: Secondary | ICD-10-CM | POA: Diagnosis not present

## 2019-10-29 DIAGNOSIS — E1169 Type 2 diabetes mellitus with other specified complication: Secondary | ICD-10-CM

## 2019-10-29 DIAGNOSIS — R000 Tachycardia, unspecified: Secondary | ICD-10-CM | POA: Diagnosis not present

## 2019-10-29 DIAGNOSIS — Z79899 Other long term (current) drug therapy: Secondary | ICD-10-CM

## 2019-10-29 DIAGNOSIS — E119 Type 2 diabetes mellitus without complications: Secondary | ICD-10-CM | POA: Diagnosis not present

## 2019-10-29 DIAGNOSIS — D701 Agranulocytosis secondary to cancer chemotherapy: Secondary | ICD-10-CM

## 2019-10-29 DIAGNOSIS — R6521 Severe sepsis with septic shock: Secondary | ICD-10-CM | POA: Diagnosis not present

## 2019-10-29 DIAGNOSIS — G4733 Obstructive sleep apnea (adult) (pediatric): Secondary | ICD-10-CM | POA: Diagnosis present

## 2019-10-29 DIAGNOSIS — C482 Malignant neoplasm of peritoneum, unspecified: Secondary | ICD-10-CM | POA: Diagnosis not present

## 2019-10-29 DIAGNOSIS — Z90710 Acquired absence of both cervix and uterus: Secondary | ICD-10-CM

## 2019-10-29 DIAGNOSIS — M545 Low back pain: Secondary | ICD-10-CM | POA: Diagnosis present

## 2019-10-29 DIAGNOSIS — E872 Acidosis: Secondary | ICD-10-CM | POA: Diagnosis present

## 2019-10-29 DIAGNOSIS — R590 Localized enlarged lymph nodes: Secondary | ICD-10-CM | POA: Diagnosis present

## 2019-10-29 DIAGNOSIS — F419 Anxiety disorder, unspecified: Secondary | ICD-10-CM | POA: Diagnosis present

## 2019-10-29 DIAGNOSIS — M316 Other giant cell arteritis: Secondary | ICD-10-CM | POA: Diagnosis present

## 2019-10-29 DIAGNOSIS — Z5111 Encounter for antineoplastic chemotherapy: Secondary | ICD-10-CM | POA: Diagnosis not present

## 2019-10-29 DIAGNOSIS — Z7952 Long term (current) use of systemic steroids: Secondary | ICD-10-CM

## 2019-10-29 DIAGNOSIS — K7210 Chronic hepatic failure without coma: Secondary | ICD-10-CM | POA: Diagnosis present

## 2019-10-29 DIAGNOSIS — E8770 Fluid overload, unspecified: Secondary | ICD-10-CM | POA: Diagnosis not present

## 2019-10-29 DIAGNOSIS — I82409 Acute embolism and thrombosis of unspecified deep veins of unspecified lower extremity: Secondary | ICD-10-CM | POA: Diagnosis not present

## 2019-10-29 DIAGNOSIS — G9341 Metabolic encephalopathy: Secondary | ICD-10-CM | POA: Diagnosis not present

## 2019-10-29 DIAGNOSIS — R531 Weakness: Secondary | ICD-10-CM | POA: Diagnosis not present

## 2019-10-29 DIAGNOSIS — Z9109 Other allergy status, other than to drugs and biological substances: Secondary | ICD-10-CM

## 2019-10-29 DIAGNOSIS — K668 Other specified disorders of peritoneum: Secondary | ICD-10-CM | POA: Diagnosis not present

## 2019-10-29 DIAGNOSIS — N390 Urinary tract infection, site not specified: Secondary | ICD-10-CM | POA: Diagnosis not present

## 2019-10-29 DIAGNOSIS — R739 Hyperglycemia, unspecified: Secondary | ICD-10-CM | POA: Diagnosis not present

## 2019-10-29 DIAGNOSIS — E11649 Type 2 diabetes mellitus with hypoglycemia without coma: Secondary | ICD-10-CM | POA: Diagnosis not present

## 2019-10-29 DIAGNOSIS — B379 Candidiasis, unspecified: Secondary | ICD-10-CM | POA: Diagnosis not present

## 2019-10-29 DIAGNOSIS — M1990 Unspecified osteoarthritis, unspecified site: Secondary | ICD-10-CM | POA: Diagnosis present

## 2019-10-29 DIAGNOSIS — L8990 Pressure ulcer of unspecified site, unspecified stage: Secondary | ICD-10-CM | POA: Insufficient documentation

## 2019-10-29 DIAGNOSIS — Z800 Family history of malignant neoplasm of digestive organs: Secondary | ICD-10-CM

## 2019-10-29 DIAGNOSIS — K2270 Barrett's esophagus without dysplasia: Secondary | ICD-10-CM | POA: Diagnosis present

## 2019-10-29 DIAGNOSIS — I677 Cerebral arteritis, not elsewhere classified: Secondary | ICD-10-CM | POA: Diagnosis not present

## 2019-10-29 DIAGNOSIS — C787 Secondary malignant neoplasm of liver and intrahepatic bile duct: Secondary | ICD-10-CM | POA: Diagnosis not present

## 2019-10-29 DIAGNOSIS — R634 Abnormal weight loss: Secondary | ICD-10-CM | POA: Diagnosis present

## 2019-10-29 DIAGNOSIS — R5081 Fever presenting with conditions classified elsewhere: Secondary | ICD-10-CM | POA: Diagnosis not present

## 2019-10-29 DIAGNOSIS — Z452 Encounter for adjustment and management of vascular access device: Secondary | ICD-10-CM

## 2019-10-29 DIAGNOSIS — R0682 Tachypnea, not elsewhere classified: Secondary | ICD-10-CM | POA: Diagnosis not present

## 2019-10-29 DIAGNOSIS — E785 Hyperlipidemia, unspecified: Secondary | ICD-10-CM | POA: Diagnosis present

## 2019-10-29 DIAGNOSIS — C779 Secondary and unspecified malignant neoplasm of lymph node, unspecified: Secondary | ICD-10-CM | POA: Diagnosis not present

## 2019-10-29 DIAGNOSIS — K7689 Other specified diseases of liver: Secondary | ICD-10-CM

## 2019-10-29 DIAGNOSIS — K7682 Hepatic encephalopathy: Secondary | ICD-10-CM

## 2019-10-29 DIAGNOSIS — Z7401 Bed confinement status: Secondary | ICD-10-CM

## 2019-10-29 DIAGNOSIS — G43009 Migraine without aura, not intractable, without status migrainosus: Secondary | ICD-10-CM | POA: Diagnosis not present

## 2019-10-29 DIAGNOSIS — Z7982 Long term (current) use of aspirin: Secondary | ICD-10-CM

## 2019-10-29 HISTORY — DX: Malignant neoplasm of unspecified site of unspecified female breast: C50.919

## 2019-10-29 LAB — COMPREHENSIVE METABOLIC PANEL
ALT: 47 U/L — ABNORMAL HIGH (ref 0–44)
AST: 251 U/L — ABNORMAL HIGH (ref 15–41)
Albumin: 2.2 g/dL — ABNORMAL LOW (ref 3.5–5.0)
Alkaline Phosphatase: 2021 U/L — ABNORMAL HIGH (ref 38–126)
Anion gap: 16 — ABNORMAL HIGH (ref 5–15)
BUN: 29 mg/dL — ABNORMAL HIGH (ref 8–23)
CO2: 23 mmol/L (ref 22–32)
Calcium: 10.6 mg/dL — ABNORMAL HIGH (ref 8.9–10.3)
Chloride: 94 mmol/L — ABNORMAL LOW (ref 98–111)
Creatinine, Ser: 1.59 mg/dL — ABNORMAL HIGH (ref 0.44–1.00)
GFR calc Af Amer: 37 mL/min — ABNORMAL LOW (ref >60)
GFR calc non Af Amer: 32 mL/min — ABNORMAL LOW (ref >60)
Glucose, Bld: 134 mg/dL — ABNORMAL HIGH (ref 70–99)
Potassium: 3.7 mmol/L (ref 3.5–5.1)
Sodium: 133 mmol/L — ABNORMAL LOW (ref 135–145)
Total Bilirubin: 2.3 mg/dL — ABNORMAL HIGH (ref 0.3–1.2)
Total Protein: 6.2 g/dL — ABNORMAL LOW (ref 6.5–8.1)

## 2019-10-29 LAB — LIPASE, BLOOD: Lipase: 33 U/L (ref 11–51)

## 2019-10-29 LAB — CBC
HCT: 40.1 % (ref 36.0–46.0)
Hemoglobin: 12.2 g/dL (ref 12.0–15.0)
MCH: 29 pg (ref 26.0–34.0)
MCHC: 30.4 g/dL (ref 30.0–36.0)
MCV: 95.5 fL (ref 80.0–100.0)
Platelets: 436 10*3/uL — ABNORMAL HIGH (ref 150–400)
RBC: 4.2 MIL/uL (ref 3.87–5.11)
RDW: 23.3 % — ABNORMAL HIGH (ref 11.5–15.5)
WBC: 12.3 10*3/uL — ABNORMAL HIGH (ref 4.0–10.5)
nRBC: 0.2 % (ref 0.0–0.2)

## 2019-10-29 LAB — HEMOGLOBIN A1C
Hgb A1c MFr Bld: 4.8 % (ref 4.8–5.6)
Mean Plasma Glucose: 91.06 mg/dL

## 2019-10-29 MED ORDER — SODIUM CHLORIDE 0.9% FLUSH
3.0000 mL | Freq: Once | INTRAVENOUS | Status: AC
  Administered 2019-10-29: 3 mL via INTRAVENOUS

## 2019-10-29 MED ORDER — INSULIN ASPART 100 UNIT/ML ~~LOC~~ SOLN
0.0000 [IU] | Freq: Three times a day (TID) | SUBCUTANEOUS | Status: DC
  Administered 2019-10-30 – 2019-11-02 (×2): 2 [IU] via SUBCUTANEOUS

## 2019-10-29 MED ORDER — OXYCODONE HCL 5 MG PO TABS
5.0000 mg | ORAL_TABLET | ORAL | Status: DC | PRN
  Administered 2019-10-29 – 2019-11-02 (×6): 5 mg via ORAL
  Filled 2019-10-29 (×6): qty 1

## 2019-10-29 MED ORDER — OXYCODONE-ACETAMINOPHEN 5-325 MG PO TABS: 1.0000 | ORAL_TABLET | ORAL | Status: DC | PRN

## 2019-10-29 MED ORDER — IOHEXOL 300 MG/ML  SOLN
75.0000 mL | Freq: Once | INTRAMUSCULAR | Status: AC | PRN
  Administered 2019-10-29: 75 mL via INTRAVENOUS

## 2019-10-29 MED ORDER — SODIUM CHLORIDE 0.9 % IV BOLUS
1000.0000 mL | Freq: Once | INTRAVENOUS | Status: AC
  Administered 2019-10-29: 21:00:00 1000 mL via INTRAVENOUS

## 2019-10-29 MED ORDER — SODIUM CHLORIDE 0.9 % IV SOLN: INTRAVENOUS | Status: AC

## 2019-10-29 MED ORDER — METOCLOPRAMIDE HCL 5 MG/ML IJ SOLN
10.0000 mg | Freq: Once | INTRAMUSCULAR | Status: AC
  Administered 2019-10-29: 10 mg via INTRAVENOUS
  Filled 2019-10-29: qty 2

## 2019-10-29 MED ORDER — MORPHINE SULFATE (PF) 2 MG/ML IV SOLN
2.0000 mg | INTRAVENOUS | Status: DC | PRN
  Filled 2019-10-29: qty 1

## 2019-10-29 NOTE — ED Triage Notes (Signed)
Pt here pov with reports of elevated K+ per pcp. Pt also reports "liver issues", abd pain, emesis and has lost 16 pounds recently.

## 2019-10-29 NOTE — ED Notes (Signed)
Per Lab, CMP and Lipase are hemolyzed and need to be recollected.

## 2019-10-29 NOTE — ED Notes (Signed)
Pt transported to CT ?

## 2019-10-29 NOTE — H&P (Signed)
History and Physical    [name] [id number] DOB: Aug 19, 1945 DOA: 10/29/2019  PCP: Leonides Sake, MD  Patient coming from: Home   Chief Complaint:  Chief Complaint  Patient presents with  . Abdominal Pain  . Abnormal Lab     HPI:    74 year old female with past medical history of lumbar spinal stenosis, diabetes mellitus type 2, temporal arteritis, cervical radiculopathy, gastroesophageal reflux disease, migraine headaches, obstructive sleep apnea and obesity who presents to [facility] emergency department at the direction of her primary care provider for abnormal labs.  Patient explains that for the past 3 months she has been experiencing abdominal pain.  This abdominal pain was initially mild in intensity but progressively became more and more severe.  Pain is waxing and waning in quality.  Patient describes the pain as sharp, typically originating in the right upper quadrant and radiating medially.  Pain is worse with movement and improved with rest.  Patient's abdominal pain continue to worsen over the next several months.  Approximately 3 weeks ago, patient also began to develop generalized weakness.  This is associated with progressively worsening poor appetite and near constant nausea with occasional bouts of nonbilious nonbloody vomiting.  Upon further questioning, patient denies fevers, dysuria, changes in chronic low back pain, diarrhea, sick contacts, recent travel or confirmed contact with known COVID-19.  Patient presents to see her primary care provider and while we do not have access to the patient's labs, she explains that her primary care provider told her that she was concerned about these labs, particularly concerned about cancer and sent her to [facility] emergency department for evaluation.  Upon evaluation in the emergency department CT imaging of the abdomen and pelvis revealed diffuse hepatic metastases with omental caking with a  complex cystic mass in the left lower quadrant consistent with necrotic lesion as well as evidence of pathologic retroperitoneal lymphadenopathy.  Patient was additionally found to be clinically dehydrated with mild hyponatremia and mild hypercalcemia as well as acute kidney injury.  Hospitalist group was then called to assess patient for admission the hospital.     Review of Systems: A 10-system review of systems has been performed and all systems are negative with the exception of what is listed in the HPI with exception of the following:  HEENT: headaches   Past Medical History:  Diagnosis Date  . ADD (attention deficit disorder)   . Anxiety   . Arthritis    os  . Diabetes mellitus without complication ([place])   . GERD (gastroesophageal reflux disease)    barretts  . Headache   . High cholesterol   . Neck pain   . Obesity   . Personal history of radiation therapy 2005  . Sleep apnea    cpap   last sleep study> 3 yrs  . Swelling of both ankles   . Weakness 06/2019    Past Surgical History:  Procedure Laterality Date  . ABDOMINAL HYSTERECTOMY    . ANTERIOR CERVICAL DECOMP/DISCECTOMY FUSION N/A 02/15/2016   Procedure: Cervical three four-Cervical five-six, Cervical six-seven Anterior cervical decompression/diskectomy/fusion;  Surgeon: Kristeen Miss, MD;  Location: [facility];  Service: Neurosurgery;  Laterality: N/A;  . bicept surgery    . BREAST LUMPECTOMY    . CHOLECYSTECTOMY    . JOINT REPLACEMENT    . PAROTID GLAND TUMOR EXCISION    . TONSILLECTOMY    . TOTAL KNEE ARTHROPLASTY  07/19/2012   Procedure: TOTAL KNEE ARTHROPLASTY;  Surgeon:  Yvette Rack., MD;  Location: [place];  Service: Orthopedics;  Laterality: Right;  right total knee arthroplasty  . TUBAL LIGATION       reports that she quit smoking about 22 years ago. She has never used smokeless tobacco. She reports previous alcohol use. She reports that she does not use drugs.  Allergies  Allergen Reactions  .  Nickel Hives and Rash    Family History  Problem Relation Age of Onset  . Breast cancer Maternal Aunt   . Breast cancer Paternal Aunt   . Lung cancer Mother        age 57  . Colon cancer Father        age 77     Prior to Admission medications   Medication Sig Start Date End Date Taking? Authorizing Provider  Cholecalciferol (VITAMIN D3) 5000 units CAPS Take 5,000 Units by mouth daily.     [provider]  fluticasone (FLONASE) 50 MCG/ACT nasal spray Place 1 spray into the nose 2 (two) times daily.     [provider]  insulin aspart (NOVOLOG) 100 UNIT/ML injection Inject 0-9 Units into the skin 3 (three) times daily with meals. 06/25/19   Welborn, Ryan, DO  insulin glargine (LANTUS) 100 UNIT/ML injection Inject 0.3 mLs (30 Units total) into the skin daily. 06/25/19   Lurline Del, DO  nortriptyline (PAMELOR) 10 MG capsule TAKE 2 CAPSULES BY MOUTH AT BEDTIME Patient taking differently: Take 20 mg by mouth at bedtime.  09/30/18   Marcial Pacas, MD  pantoprazole (PROTONIX) 40 MG tablet Take 40 mg by mouth daily.    [provider]  predniSONE (DELTASONE) 20 MG tablet Take 1 tablet (20 mg total) by mouth daily with breakfast. 06/26/19   Lurline Del, DO  simvastatin (ZOCOR) 20 MG tablet Take 20 mg by mouth at bedtime.     [provider]    Physical Exam: Vitals:   10/29/19 2045 10/29/19 2100 10/29/19 2115 10/29/19 2215  BP: 123/78 121/74 124/69 118/68  Pulse: 90 89 90 92  Resp: 17 17 17 18   Temp:      TempSrc:      SpO2: 94% 94% 94% 96%  Weight:      Height:         Constitutional: Lethargic but arousable and oriented x3,  no associated distress.   Skin: no rashes, no lesions, extremely poor skin turgor noted. Eyes: Pupils are equally reactive to light.  No evidence of scleral icterus or conjunctival pallor.  ENMT: Dry mucous membranes noted.  Posterior pharynx clear of any exudate or lesions.   Neck: normal, supple, no masses, no thyromegaly.  No  evidence of jugular venous distension.   Respiratory: clear to auscultation bilaterally, no wheezing, no crackles. Normal respiratory effort. No accessory muscle use.  Cardiovascular: Regular rate and rhythm, no murmurs / rubs / gallops.  +1 bilateral lower extremity pitting edema. 2+ pedal pulses. No carotid bruits.  Chest:   Nontender without crepitus or deformity.   Back:   Nontender without crepitus or deformity. Abdomen: Generalized abdominal tenderness, worst in the epigastric and right upper quadrant regions.  Notable hepatomegaly.  No obvious evidence of ascites.  Positive bowel sounds noted in all quadrants.   Musculoskeletal: No joint deformity upper and lower extremities. Good ROM, no contractures. Normal muscle tone.  Neurologic:  Sensation intact, patient moving all 4 extremities spontaneously patient is following all commands.  Patient is responsive to verbal stimuli.   Psychiatric: Patient  presents as a depressed mood with flat affect.  Patient seems to possess insight as to theircurrent situation.     Labs on Admission: I have personally reviewed following labs and imaging studies -   CBC: Recent Labs  Lab 10/29/19 1719  WBC 12.3*  HGB 12.2  HCT 40.1  MCV 95.5  PLT [id number]*   Basic Metabolic Panel: Recent Labs  Lab 10/29/19 1857  NA 133*  K 3.7  CL 94*  CO2 23  GLUCOSE 134*  BUN 29*  CREATININE 1.59*  CALCIUM 10.6*   GFR: Estimated Creatinine Clearance: 37 mL/min (A) (by C-G formula based on SCr of 1.59 mg/dL (H)). Liver Function Tests: Recent Labs  Lab 10/29/19 1857  AST 251*  ALT 47*  ALKPHOS 2,021*  BILITOT 2.3*  PROT 6.2*  ALBUMIN 2.2*   Recent Labs  Lab 10/29/19 1857  LIPASE 33   No results for input(s): AMMONIA in the last 168 hours. Coagulation Profile: No results for input(s): INR, PROTIME in the last 168 hours. Cardiac Enzymes: No results for input(s): CKTOTAL, CKMB, CKMBINDEX, TROPONINI in the last 168 hours. BNP (last 3 results) No  results for input(s): PROBNP in the last 8760 hours. HbA1C: No results for input(s): HGBA1C in the last 72 hours. CBG: No results for input(s): GLUCAP in the last 168 hours. Lipid Profile: No results for input(s): CHOL, HDL, LDLCALC, TRIG, CHOLHDL, LDLDIRECT in the last 72 hours. Thyroid Function Tests: No results for input(s): TSH, T4TOTAL, FREET4, T3FREE, THYROIDAB in the last 72 hours. Anemia Panel: No results for input(s): VITAMINB12, FOLATE, FERRITIN, TIBC, IRON, RETICCTPCT in the last 72 hours. Urine analysis:    Component Value Date/Time   COLORURINE AMBER (A) 06/18/2019 1230   APPEARANCEUR CLOUDY (A) 06/18/2019 1230   LABSPEC 1.026 06/18/2019 1230   PHURINE 6.0 06/18/2019 1230   GLUCOSEU >=500 (A) 06/18/2019 1230   HGBUR SMALL (A) 06/18/2019 1230   BILIRUBINUR NEGATIVE 06/18/2019 1230   KETONESUR 5 (A) 06/18/2019 1230   PROTEINUR 30 (A) 06/18/2019 1230   UROBILINOGEN 1.0 07/11/2012 1023   NITRITE POSITIVE (A) 06/18/2019 1230   LEUKOCYTESUR LARGE (A) 06/18/2019 1230    Radiological Exams on Admission - Personally Reviewed: CT ABDOMEN PELVIS W CONTRAST  Result Date: 10/29/2019 CLINICAL DATA:  Hyperkalemia,, liver disease, emesis, weight loss EXAM: CT ABDOMEN AND PELVIS WITH CONTRAST TECHNIQUE: Multidetector CT imaging of the abdomen and pelvis was performed using the standard protocol following bolus administration of intravenous contrast. CONTRAST:  50mL OMNIPAQUE IOHEXOL 300 MG/ML  SOLN COMPARISON:  06/17/2019, 01/05/2006 FINDINGS: Lower chest: No acute pleural or parenchymal lung disease. Hepatobiliary: There are innumerable masses replacing the majority of the liver parenchyma consistent with diffuse metastatic disease. The gallbladder is surgically absent. Pancreas: Unremarkable. No pancreatic ductal dilatation or surrounding inflammatory changes. Spleen: Normal in size without focal abnormality. Adrenals/Urinary Tract: Adrenal glands are unremarkable. Kidneys are normal,  without renal calculi, focal lesion, or hydronephrosis. Bladder is unremarkable. Stomach/Bowel: No bowel obstruction or ileus. No bowel wall thickening or inflammatory changes. Vascular/Lymphatic: There is pathologic adenopathy within the retroperitoneum. Largest pathologic lymph node in the left external iliac chain reference image 77 measures 3.5 x 3.1 cm. A second index lymph node at the right iliac bifurcation measures 2.6 x 2.2 cm reference image 62. There is minimal atherosclerosis of the aorta. Reproductive: Uterus is surgically absent. I do not see any adnexal lesions. Other: There is soft tissue fat stranding throughout the ventral omentum consistent with omental caking. Complex cystic mass within  the left lower quadrant along the anterior abdominal wall measuring 5.7 x 7.2 cm is most consistent with a necrotic mass. Mural thickening along the anterior and lateral margin of the mass. There is trace free fluid within the upper abdomen surrounding the liver and spleen. No free intra-abdominal gas. Musculoskeletal: There are no acute or destructive bony lesions. Reconstructed images demonstrate no additional findings. IMPRESSION: 1. Diffuse hepatic metastases. 2. Omental caking. Complex cystic mass left lower quadrant consistent with necrotic lesion. 3. Pathologic retroperitoneal lymphadenopathy, most pronounced within the iliac distributions left greater than right. 4. Trace ascites. Electronically Signed   By: Randa Ngo M.D.   On: 10/29/2019 22:15    EKG: Personally reviewed.  Rhythm is normal sinus rhythm with heart rate of 100 bpm.  No dynamic ST segment changes appreciated.  Assessment/Plan Principal Problem:   Metastatic neoplastic disease [facility])   Patient presenting with 20-month history of progressively worsening abdominal pain, generalized weakness and poor appetite with 11 pound weight loss in the past few weeks.  CT imaging of the abdomen revealing findings concerning for diffuse  metastatic disease throughout the liver as well with pathologic adenopathy in the retroperitoneum with the largest pathologic lymph node in the left external iliac chain measuring 3.5 x 3.1 cm and a second lymph node measuring 2.6 x 2.2 cm.  Finally, there is a complex cystic mass in the left lower quadrant of the abdomen along the anterior abdominal wall measuring 5.7 x 7.2 cm.  Patient's presentation along with CT imaging findings is concerning for metastatic disease, unknown primary at this time.  I have discussed the case with Dr. Owens Shark radiology who feels that a tissue diagnosis would be best achieved via interventional radiology consultation in the morning for biopsy of either the liver or lymph nodes.  Interventional radiology consultation order has been placed.  Additionally, patient would likely benefit from early GI consultation especially considering substantial derangement of liver enzymes.  CA 19-9, CEA and AFP have been ordered.  Hydrating patient with intravenous isotonic fluids considering dehydration, hyponatremia and hypercalcemia  Will obtain oncology consultation once the tissue diagnosis has been established.  Active Problems:   AKI (acute kidney injury) ([place])   Acute kidney injury likely secondary to volume depletion  Hydrating patient with intravenous isotonic fluids  Strict input and output monitoring  Monitoring renal function electrolytes with serial chemistries.    Dehydration with hyponatremia   Mild hyponatremia secondary to volume depletion  During patient with intravenous isotonic fluids  Monitoring sodium levels with serial chemistries.    Uncontrolled type 2 diabetes mellitus with hyperglycemia, with long-term current use of insulin ([place])   Patient has been on chronic use of basal bolus insulin therapy but has recently weaned herself off of insulin in the outpatient setting completely and reports being normoglycemic despite this  Will place  patient on Accu-Cheks before every meal and nightly with sliding scale insulin for now.  Obtaining hemoglobin A1c    Hypercalcemia   Mild hypercalcemia  Since hypercalcemia some mild, I am uncertain as to whether or not this is actually due to malignancy or simply just due to dehydration.  Hydrating patient with intravenous isotonic fluids -no need for zoledronic acid or bisphosphonate therapy at this time.  Monitoring calcium levels with serial chemistries   Additionally obtain ionized calcium     GERD without esophagitis  40 mg Protonix daily  Code Status:  Full code Family Communication: Deferred  Status is: Observation  The patient remains OBS  appropriate and will d/c before 2 midnights.  Dispo: The patient is from: Home              Anticipated d/c is to: Home              Anticipated d/c date is: 2 days              Patient currently is not medically stable to d/c.        Vernelle Emerald MD [facility] Pager [phone]  If 7PM-7AM, please contact night-coverage [email or web address] Use universal [place] password for that web site. If you do not have the password, please call the hospital operator.  10/29/2019, 11:39 PM

## 2019-10-29 NOTE — ED Provider Notes (Signed)
[place] EMERGENCY DEPARTMENT Provider Note   CSN: [id number] Arrival date & time: 10/29/19  1608     History Chief Complaint  Patient presents with  . Abdominal Pain  . Abnormal Lab    [name] is a 74 y.o. female.  HPI    Patient presents with abdominal pain, nausea, weakness. Patient has multiple medical issues, and notes that she has had worsening weakness recently, without a definite timeframe. With new abdominal pain in the upper abdomen, and right side, as well as new anorexia, nausea she saw her physician. Labs were apparently abnormal, and with this concern, she presents for evaluation. She denies fever, denies diarrhea. It is unclear if she is taking any medication for pain relief, or if there have been any recent changes.  Past Medical History:  Diagnosis Date  . ADD (attention deficit disorder)   . Anxiety   . Arthritis    os  . Diabetes mellitus without complication ([place])   . GERD (gastroesophageal reflux disease)    barretts  . Headache   . High cholesterol   . Neck pain   . Obesity   . Personal history of radiation therapy 2005  . Sleep apnea    cpap   last sleep study> 3 yrs  . Swelling of both ankles   . Weakness 06/2019    Patient Active Problem List   Diagnosis Date Noted  . Polypharmacy   . Uncontrolled type 2 diabetes mellitus with hyperglycemia ([place])   . Weakness 06/19/2019  . Cushingoid side effect of steroids ([place])   . Generalized weakness 06/17/2019  . Hyperglycemia   . Hypophosphatemia   . Cellulitis of leg, left   . Elevated LFTs   . Mass in neck   . Cervical radiculopathy at C8 01/31/2019  . Right arm numbness 01/30/2019  . Chronic migraine 12/06/2017  . Chronic intractable headache 10/23/2017  . Cervical myelopathy with cervical radiculopathy 02/15/2016    Past Surgical History:  Procedure Laterality Date  . ABDOMINAL HYSTERECTOMY    . ANTERIOR CERVICAL DECOMP/DISCECTOMY FUSION N/A 02/15/2016    Procedure: Cervical three four-Cervical five-six, Cervical six-seven Anterior cervical decompression/diskectomy/fusion;  Surgeon: Kristeen Miss, MD;  Location: [facility];  Service: Neurosurgery;  Laterality: N/A;  . bicept surgery    . BREAST LUMPECTOMY    . CHOLECYSTECTOMY    . JOINT REPLACEMENT    . PAROTID GLAND TUMOR EXCISION    . TONSILLECTOMY    . TOTAL KNEE ARTHROPLASTY  07/19/2012   Procedure: TOTAL KNEE ARTHROPLASTY;  Surgeon: Yvette Rack., MD;  Location: [place] [facility];  Service: Orthopedics;  Laterality: Right;  right total knee arthroplasty  . TUBAL LIGATION       OB History   No obstetric history on file.     Family History  Problem Relation Age of Onset  . Breast cancer Maternal Aunt   . Breast cancer Paternal Aunt   . Lung cancer Mother        age 61  . Colon cancer Father        age 23    Social History   Tobacco Use  . Smoking status: Former Smoker    Quit date: 07/11/1997    Years since quitting: 22.3  . Smokeless tobacco: Never Used  Substance Use Topics  . Alcohol use: Not Currently  . Drug use: No    Home Medications Prior to Admission medications   Medication Sig Start Date End Date Taking? Authorizing  Provider  Cholecalciferol (VITAMIN D3) 5000 units CAPS Take 5,000 Units by mouth daily.     [provider]  fluticasone (FLONASE) 50 MCG/ACT nasal spray Place 1 spray into the nose 2 (two) times daily.     [provider]  insulin aspart (NOVOLOG) 100 UNIT/ML injection Inject 0-9 Units into the skin 3 (three) times daily with meals. 06/25/19   Welborn, Ryan, DO  insulin glargine (LANTUS) 100 UNIT/ML injection Inject 0.3 mLs (30 Units total) into the skin daily. 06/25/19   Lurline Del, DO  nortriptyline (PAMELOR) 10 MG capsule TAKE 2 CAPSULES BY MOUTH AT BEDTIME Patient taking differently: Take 20 mg by mouth at bedtime.  09/30/18   Marcial Pacas, MD  pantoprazole (PROTONIX) 40 MG tablet Take 40 mg by mouth daily.    [provider]  predniSONE (DELTASONE) 20 MG tablet Take 1 tablet (20 mg total) by mouth daily with breakfast. 06/26/19   Lurline Del, DO  simvastatin (ZOCOR) 20 MG tablet Take 20 mg by mouth at bedtime.     [provider]    Allergies    Nickel  Review of Systems   Review of Systems  Constitutional:       Per HPI, otherwise negative  HENT:       Per HPI, otherwise negative  Respiratory:       Per HPI, otherwise negative  Cardiovascular:       Per HPI, otherwise negative  Gastrointestinal: Positive for nausea and vomiting.  Endocrine:       Negative aside from HPI  Genitourinary:       Neg aside from HPI   Musculoskeletal:       Per HPI, otherwise negative  Skin: Negative.   Neurological: Positive for weakness. Negative for syncope.    Physical Exam Updated Vital Signs BP 118/68   Pulse 92   Temp 98.6 F (37 C)   Resp 18   Ht _0  (1.626 m)   Wt 101.2 kg   SpO2 96%   BMI 38.28 kg/m   Physical Exam Vitals and nursing note reviewed.  Constitutional:      General: She is not in acute distress.    Appearance: She is obese.  HENT:     Head: Normocephalic and atraumatic.  Eyes:     Conjunctiva/sclera: Conjunctivae normal.  Cardiovascular:     Rate and Rhythm: Normal rate and regular rhythm.  Pulmonary:     Effort: Pulmonary effort is normal. No respiratory distress.     Breath sounds: Normal breath sounds. No stridor.  Abdominal:     General: There is no distension.     Tenderness: There is generalized abdominal tenderness and tenderness in the right upper quadrant and epigastric area.  Skin:    General: Skin is warm and dry.  Neurological:     Mental Status: She is alert and oriented to person, place, and time.     Cranial Nerves: No cranial nerve deficit.      ED Results / Procedures / Treatments   Labs (all labs ordered are listed, but only abnormal results are displayed) Labs Reviewed  CBC - Abnormal; Notable for the following components:       Result Value   WBC 12.3 (*)    RDW 23.3 (*)    Platelets 436 (*)    All other components within normal limits  COMPREHENSIVE METABOLIC PANEL - Abnormal; Notable for the following components:   Sodium 133 (*)    Chloride  94 (*)    Glucose, Bld 134 (*)    BUN 29 (*)    Creatinine, Ser 1.59 (*)    Calcium 10.6 (*)    Total Protein 6.2 (*)    Albumin 2.2 (*)    AST 251 (*)    ALT 47 (*)    Alkaline Phosphatase 2,021 (*)    Total Bilirubin 2.3 (*)    GFR calc non Af Amer 32 (*)    GFR calc Af Amer 37 (*)    Anion gap 16 (*)    All other components within normal limits  SARS CORONAVIRUS 2 BY RT PCR (HOSPITAL ORDER, [place])  LIPASE, BLOOD  URINALYSIS, ROUTINE W REFLEX MICROSCOPIC    EKG EKG Interpretation  Date/Time:  Wednesday Oct 29 2019 16:19:56 EDT Ventricular Rate:  100 PR Interval:  136 QRS Duration: 76 QT Interval:  340 QTC Calculation: 438 R Axis:   2 Text Interpretation: Normal sinus rhythm T wave abnormality Baseline wander Abnormal ECG Confirmed by Carmin Muskrat [id number]) on 10/29/2019 8:25:28 PM   Radiology CT ABDOMEN PELVIS W CONTRAST  Result Date: 10/29/2019 CLINICAL DATA:  Hyperkalemia,, liver disease, emesis, weight loss EXAM: CT ABDOMEN AND PELVIS WITH CONTRAST TECHNIQUE: Multidetector CT imaging of the abdomen and pelvis was performed using the standard protocol following bolus administration of intravenous contrast. CONTRAST:  49m OMNIPAQUE IOHEXOL 300 MG/ML  SOLN COMPARISON:  06/17/2019, 01/05/2006 FINDINGS: Lower chest: No acute pleural or parenchymal lung disease. Hepatobiliary: There are innumerable masses replacing the majority of the liver parenchyma consistent with diffuse metastatic disease. The gallbladder is surgically absent. Pancreas: Unremarkable. No pancreatic ductal dilatation or surrounding inflammatory changes. Spleen: Normal in size without focal abnormality. Adrenals/Urinary Tract: Adrenal glands are  unremarkable. Kidneys are normal, without renal calculi, focal lesion, or hydronephrosis. Bladder is unremarkable. Stomach/Bowel: No bowel obstruction or ileus. No bowel wall thickening or inflammatory changes. Vascular/Lymphatic: There is pathologic adenopathy within the retroperitoneum. Largest pathologic lymph node in the left external iliac chain reference image 77 measures 3.5 x 3.1 cm. A second index lymph node at the right iliac bifurcation measures 2.6 x 2.2 cm reference image 62. There is minimal atherosclerosis of the aorta. Reproductive: Uterus is surgically absent. I do not see any adnexal lesions. Other: There is soft tissue fat stranding throughout the ventral omentum consistent with omental caking. Complex cystic mass within the left lower quadrant along the anterior abdominal wall measuring 5.7 x 7.2 cm is most consistent with a necrotic mass. Mural thickening along the anterior and lateral margin of the mass. There is trace free fluid within the upper abdomen surrounding the liver and spleen. No free intra-abdominal gas. Musculoskeletal: There are no acute or destructive bony lesions. Reconstructed images demonstrate no additional findings. IMPRESSION: 1. Diffuse hepatic metastases. 2. Omental caking. Complex cystic mass left lower quadrant consistent with necrotic lesion. 3. Pathologic retroperitoneal lymphadenopathy, most pronounced within the iliac distributions left greater than right. 4. Trace ascites. Electronically Signed   By: MRanda NgoM.D.   On: 10/29/2019 22:15    Procedures Procedures (including critical care time)  Medications Ordered in ED Medications  metoCLOPramide (REGLAN) injection 10 mg (has no administration in time range)  sodium chloride flush (NS) 0.9 % injection 3 mL (3 mLs Intravenous Given 10/29/19 2012)  sodium chloride 0.9 % bolus 1,000 mL (1,000 mLs Intravenous New Bag/Given 10/29/19 2128)  iohexol (OMNIPAQUE) 300 MG/ML solution 75 mL (75 mLs Intravenous  Contrast Given 10/29/19 2134)  ED Course  I have reviewed the triage vital signs and the nursing notes.  Pertinent labs & imaging results that were available during my care of the patient were reviewed by me and considered in my medical decision making (see chart for details).   11:13 PM Patient awake, alert.  We had a lengthy conversation about the patient's CT findings, labs. Labs notable for acute kidney injury, multiple mild electrolyte abnormalities, and hepatic dysfunction, as well as alk phos greater than 2000. Patient's CT scan concerning for findings consistent with malignancy, with multiple hepatic abnormalities concerning for metastases, omental caking, and cystic mass in the left lower quadrant concerning for necrosis. Patient is aware of all of these findings, need for admission given the combination of liver dysfunction, concern for malignancy.  MDM Number of Diagnoses or Management Options   Amount and/or Complexity of Data Reviewed Clinical lab tests: reviewed Tests in the radiology section of CPT: reviewed Tests in the medicine section of CPT: reviewed Decide to obtain previous medical records or to obtain history from someone other than the patient: yes Review and summarize past medical records: yes Discuss the patient with other providers: yes Independent visualization of images, tracings, or specimens: yes  Risk of Complications, Morbidity, and/or Mortality Presenting problems: high Diagnostic procedures: high Management options: high   Final Clinical Impression(s) / ED Diagnoses Final diagnoses:  AKI (acute kidney injury) ([place])  Hepatic dysfunction  Liver mass     Carmin Muskrat, MD 10/29/19 2316

## 2019-10-30 ENCOUNTER — Encounter (HOSPITAL_COMMUNITY): Payer: Self-pay | Admitting: Internal Medicine

## 2019-10-30 ENCOUNTER — Inpatient Hospital Stay (HOSPITAL_COMMUNITY): Payer: Medicare Other

## 2019-10-30 ENCOUNTER — Other Ambulatory Visit: Payer: Self-pay

## 2019-10-30 DIAGNOSIS — C779 Secondary and unspecified malignant neoplasm of lymph node, unspecified: Secondary | ICD-10-CM | POA: Diagnosis not present

## 2019-10-30 DIAGNOSIS — D61810 Antineoplastic chemotherapy induced pancytopenia: Secondary | ICD-10-CM | POA: Diagnosis not present

## 2019-10-30 DIAGNOSIS — B370 Candidal stomatitis: Secondary | ICD-10-CM | POA: Diagnosis not present

## 2019-10-30 DIAGNOSIS — C799 Secondary malignant neoplasm of unspecified site: Secondary | ICD-10-CM | POA: Diagnosis not present

## 2019-10-30 DIAGNOSIS — R531 Weakness: Secondary | ICD-10-CM

## 2019-10-30 DIAGNOSIS — N179 Acute kidney failure, unspecified: Secondary | ICD-10-CM | POA: Diagnosis present

## 2019-10-30 DIAGNOSIS — C787 Secondary malignant neoplasm of liver and intrahepatic bile duct: Secondary | ICD-10-CM | POA: Diagnosis present

## 2019-10-30 DIAGNOSIS — K668 Other specified disorders of peritoneum: Secondary | ICD-10-CM | POA: Diagnosis not present

## 2019-10-30 DIAGNOSIS — R6521 Severe sepsis with septic shock: Secondary | ICD-10-CM | POA: Diagnosis not present

## 2019-10-30 DIAGNOSIS — A419 Sepsis, unspecified organism: Secondary | ICD-10-CM | POA: Diagnosis not present

## 2019-10-30 DIAGNOSIS — Z7189 Other specified counseling: Secondary | ICD-10-CM | POA: Diagnosis not present

## 2019-10-30 DIAGNOSIS — E119 Type 2 diabetes mellitus without complications: Secondary | ICD-10-CM

## 2019-10-30 DIAGNOSIS — C482 Malignant neoplasm of peritoneum, unspecified: Secondary | ICD-10-CM | POA: Diagnosis present

## 2019-10-30 DIAGNOSIS — R188 Other ascites: Secondary | ICD-10-CM | POA: Diagnosis not present

## 2019-10-30 DIAGNOSIS — R7989 Other specified abnormal findings of blood chemistry: Secondary | ICD-10-CM

## 2019-10-30 DIAGNOSIS — C7889 Secondary malignant neoplasm of other digestive organs: Secondary | ICD-10-CM | POA: Diagnosis not present

## 2019-10-30 DIAGNOSIS — K7200 Acute and subacute hepatic failure without coma: Secondary | ICD-10-CM | POA: Diagnosis present

## 2019-10-30 DIAGNOSIS — D61818 Other pancytopenia: Secondary | ICD-10-CM | POA: Diagnosis not present

## 2019-10-30 DIAGNOSIS — R601 Generalized edema: Secondary | ICD-10-CM | POA: Diagnosis not present

## 2019-10-30 DIAGNOSIS — N390 Urinary tract infection, site not specified: Secondary | ICD-10-CM | POA: Diagnosis not present

## 2019-10-30 DIAGNOSIS — R590 Localized enlarged lymph nodes: Secondary | ICD-10-CM | POA: Diagnosis not present

## 2019-10-30 DIAGNOSIS — K7290 Hepatic failure, unspecified without coma: Secondary | ICD-10-CM | POA: Diagnosis not present

## 2019-10-30 DIAGNOSIS — T451X5A Adverse effect of antineoplastic and immunosuppressive drugs, initial encounter: Secondary | ICD-10-CM | POA: Diagnosis not present

## 2019-10-30 DIAGNOSIS — E860 Dehydration: Secondary | ICD-10-CM

## 2019-10-30 DIAGNOSIS — Z66 Do not resuscitate: Secondary | ICD-10-CM | POA: Diagnosis not present

## 2019-10-30 DIAGNOSIS — Z853 Personal history of malignant neoplasm of breast: Secondary | ICD-10-CM

## 2019-10-30 DIAGNOSIS — Z515 Encounter for palliative care: Secondary | ICD-10-CM | POA: Diagnosis not present

## 2019-10-30 DIAGNOSIS — I677 Cerebral arteritis, not elsewhere classified: Secondary | ICD-10-CM

## 2019-10-30 DIAGNOSIS — Z6841 Body Mass Index (BMI) 40.0 and over, adult: Secondary | ICD-10-CM | POA: Diagnosis not present

## 2019-10-30 DIAGNOSIS — C786 Secondary malignant neoplasm of retroperitoneum and peritoneum: Secondary | ICD-10-CM | POA: Diagnosis present

## 2019-10-30 DIAGNOSIS — D702 Other drug-induced agranulocytosis: Secondary | ICD-10-CM | POA: Diagnosis not present

## 2019-10-30 DIAGNOSIS — G9341 Metabolic encephalopathy: Secondary | ICD-10-CM | POA: Diagnosis not present

## 2019-10-30 DIAGNOSIS — R7401 Elevation of levels of liver transaminase levels: Secondary | ICD-10-CM | POA: Diagnosis not present

## 2019-10-30 DIAGNOSIS — B964 Proteus (mirabilis) (morganii) as the cause of diseases classified elsewhere: Secondary | ICD-10-CM | POA: Diagnosis not present

## 2019-10-30 DIAGNOSIS — E872 Acidosis: Secondary | ICD-10-CM | POA: Diagnosis present

## 2019-10-30 DIAGNOSIS — G43009 Migraine without aura, not intractable, without status migrainosus: Secondary | ICD-10-CM

## 2019-10-30 DIAGNOSIS — K769 Liver disease, unspecified: Secondary | ICD-10-CM | POA: Diagnosis not present

## 2019-10-30 DIAGNOSIS — E871 Hypo-osmolality and hyponatremia: Secondary | ICD-10-CM | POA: Diagnosis present

## 2019-10-30 DIAGNOSIS — I82409 Acute embolism and thrombosis of unspecified deep veins of unspecified lower extremity: Secondary | ICD-10-CM | POA: Diagnosis not present

## 2019-10-30 DIAGNOSIS — I96 Gangrene, not elsewhere classified: Secondary | ICD-10-CM | POA: Diagnosis present

## 2019-10-30 DIAGNOSIS — C569 Malignant neoplasm of unspecified ovary: Secondary | ICD-10-CM | POA: Diagnosis present

## 2019-10-30 DIAGNOSIS — E242 Drug-induced Cushing's syndrome: Secondary | ICD-10-CM | POA: Diagnosis present

## 2019-10-30 DIAGNOSIS — A4151 Sepsis due to Escherichia coli [E. coli]: Secondary | ICD-10-CM | POA: Diagnosis not present

## 2019-10-30 DIAGNOSIS — D701 Agranulocytosis secondary to cancer chemotherapy: Secondary | ICD-10-CM | POA: Diagnosis present

## 2019-10-30 DIAGNOSIS — Z20822 Contact with and (suspected) exposure to covid-19: Secondary | ICD-10-CM | POA: Diagnosis present

## 2019-10-30 LAB — PROTIME-INR
INR: 1.1 (ref 0.8–1.2)
Prothrombin Time: 13.6 seconds (ref 11.4–15.2)

## 2019-10-30 LAB — CBC WITH DIFFERENTIAL/PLATELET
Abs Immature Granulocytes: 0 10*3/uL (ref 0.00–0.07)
Basophils Absolute: 0.1 10*3/uL (ref 0.0–0.1)
Basophils Relative: 1 %
Eosinophils Absolute: 0.3 10*3/uL (ref 0.0–0.5)
Eosinophils Relative: 2 %
HCT: 35.4 % — ABNORMAL LOW (ref 36.0–46.0)
Hemoglobin: 11.1 g/dL — ABNORMAL LOW (ref 12.0–15.0)
Lymphocytes Relative: 3 %
Lymphs Abs: 0.4 10*3/uL — ABNORMAL LOW (ref 0.7–4.0)
MCH: 29.1 pg (ref 26.0–34.0)
MCHC: 31.4 g/dL (ref 30.0–36.0)
MCV: 92.9 fL (ref 80.0–100.0)
Monocytes Absolute: 1.3 10*3/uL — ABNORMAL HIGH (ref 0.1–1.0)
Monocytes Relative: 10 %
Neutro Abs: 10.5 10*3/uL — ABNORMAL HIGH (ref 1.7–7.7)
Neutrophils Relative %: 84 %
Platelets: 420 10*3/uL — ABNORMAL HIGH (ref 150–400)
RBC: 3.81 MIL/uL — ABNORMAL LOW (ref 3.87–5.11)
RDW: 23.5 % — ABNORMAL HIGH (ref 11.5–15.5)
WBC: 12.5 10*3/uL — ABNORMAL HIGH (ref 4.0–10.5)
nRBC: 0 /100 WBC
nRBC: 0.2 % (ref 0.0–0.2)

## 2019-10-30 LAB — GLUCOSE, CAPILLARY
Glucose-Capillary: 74 mg/dL (ref 70–99)
Glucose-Capillary: 63 mg/dL — ABNORMAL LOW (ref 70–99)
Glucose-Capillary: 66 mg/dL — ABNORMAL LOW (ref 70–99)
Glucose-Capillary: 93 mg/dL (ref 70–99)
Glucose-Capillary: 121 mg/dL — ABNORMAL HIGH (ref 70–99)

## 2019-10-30 LAB — HEPATIC FUNCTION PANEL
ALT: 43 U/L (ref 0–44)
ALT: 43 U/L (ref 0–44)
AST: 234 U/L — ABNORMAL HIGH (ref 15–41)
AST: 239 U/L — ABNORMAL HIGH (ref 15–41)
Albumin: 2 g/dL — ABNORMAL LOW (ref 3.5–5.0)
Albumin: 2 g/dL — ABNORMAL LOW (ref 3.5–5.0)
Alkaline Phosphatase: 1941 U/L — ABNORMAL HIGH (ref 38–126)
Alkaline Phosphatase: 1970 U/L — ABNORMAL HIGH (ref 38–126)
Bilirubin, Direct: 1.2 mg/dL — ABNORMAL HIGH (ref 0.0–0.2)
Bilirubin, Direct: 1.2 mg/dL — ABNORMAL HIGH (ref 0.0–0.2)
Indirect Bilirubin: 1 mg/dL — ABNORMAL HIGH (ref 0.3–0.9)
Indirect Bilirubin: 1.1 mg/dL — ABNORMAL HIGH (ref 0.3–0.9)
Total Bilirubin: 2.2 mg/dL — ABNORMAL HIGH (ref 0.3–1.2)
Total Bilirubin: 2.3 mg/dL — ABNORMAL HIGH (ref 0.3–1.2)
Total Protein: 5.9 g/dL — ABNORMAL LOW (ref 6.5–8.1)
Total Protein: 5.7 g/dL — ABNORMAL LOW (ref 6.5–8.1)

## 2019-10-30 LAB — HEPATITIS PANEL, ACUTE
HCV Ab: NONREACTIVE
Hep A IgM: NONREACTIVE
Hep B C IgM: NONREACTIVE
Hepatitis B Surface Ag: NONREACTIVE

## 2019-10-30 LAB — SAMPLE TO BLOOD BANK

## 2019-10-30 LAB — BASIC METABOLIC PANEL
Anion gap: 12 (ref 5–15)
BUN: 24 mg/dL — ABNORMAL HIGH (ref 8–23)
CO2: 23 mmol/L (ref 22–32)
Calcium: 10.3 mg/dL (ref 8.9–10.3)
Chloride: 100 mmol/L (ref 98–111)
Creatinine, Ser: 1.26 mg/dL — ABNORMAL HIGH (ref 0.44–1.00)
GFR calc Af Amer: 49 mL/min — ABNORMAL LOW (ref >60)
GFR calc non Af Amer: 43 mL/min — ABNORMAL LOW (ref >60)
Glucose, Bld: 82 mg/dL (ref 70–99)
Potassium: 3.6 mmol/L (ref 3.5–5.1)
Sodium: 135 mmol/L (ref 135–145)

## 2019-10-30 LAB — MAGNESIUM: Magnesium: 1.9 mg/dL (ref 1.7–2.4)

## 2019-10-30 LAB — TSH: TSH: 1.696 u[IU]/mL (ref 0.350–4.500)

## 2019-10-30 LAB — CBG MONITORING, ED: Glucose-Capillary: 73 mg/dL (ref 70–99)

## 2019-10-30 LAB — LACTIC ACID, PLASMA
Lactic Acid, Venous: 3.1 mmol/L (ref 0.5–1.9)
Lactic Acid, Venous: 2.6 mmol/L (ref 0.5–1.9)

## 2019-10-30 LAB — APTT: aPTT: 35 seconds (ref 24–36)

## 2019-10-30 LAB — SARS CORONAVIRUS 2 BY RT PCR (HOSPITAL ORDER, PERFORMED IN ~~LOC~~ HOSPITAL LAB): SARS Coronavirus 2: NEGATIVE

## 2019-10-30 MED ORDER — SODIUM CHLORIDE 0.9 % IV SOLN: INTRAVENOUS | Status: DC

## 2019-10-30 MED ORDER — NORTRIPTYLINE HCL 10 MG PO CAPS
20.0000 mg | ORAL_CAPSULE | Freq: Every day | ORAL | Status: DC
  Filled 2019-10-30: qty 2

## 2019-10-30 MED ORDER — POLYETHYLENE GLYCOL 3350 17 G PO PACK: 17.0000 g | PACK | Freq: Every day | ORAL | Status: DC | PRN

## 2019-10-30 MED ORDER — FLUTICASONE PROPIONATE 50 MCG/ACT NA SUSP
1.0000 | Freq: Two times a day (BID) | NASAL | Status: DC
  Administered 2019-10-30 – 2019-11-17 (×34): 1 via NASAL
  Filled 2019-10-30 (×2): qty 16

## 2019-10-30 MED ORDER — ONDANSETRON HCL 4 MG/2ML IJ SOLN
4.0000 mg | Freq: Four times a day (QID) | INTRAMUSCULAR | Status: DC | PRN
  Administered 2019-11-03 – 2019-11-16 (×8): 4 mg via INTRAVENOUS
  Filled 2019-10-30 (×8): qty 2

## 2019-10-30 MED ORDER — ACETAMINOPHEN 325 MG PO TABS
650.0000 mg | ORAL_TABLET | Freq: Four times a day (QID) | ORAL | Status: DC | PRN
  Administered 2019-11-07 – 2019-11-14 (×10): 650 mg via ORAL
  Filled 2019-10-30 (×10): qty 2

## 2019-10-30 MED ORDER — PANTOPRAZOLE SODIUM 40 MG IV SOLR
40.0000 mg | INTRAVENOUS | Status: DC
  Administered 2019-10-30 – 2019-11-02 (×4): 40 mg via INTRAVENOUS
  Filled 2019-10-30 (×4): qty 40

## 2019-10-30 MED ORDER — ENSURE ENLIVE PO LIQD
237.0000 mL | Freq: Two times a day (BID) | ORAL | Status: DC
  Administered 2019-10-30 – 2019-10-31 (×2): 237 mL via ORAL

## 2019-10-30 MED ORDER — ACETAMINOPHEN 650 MG RE SUPP: 650.0000 mg | Freq: Four times a day (QID) | RECTAL | Status: DC | PRN

## 2019-10-30 MED ORDER — NYSTATIN 100000 UNIT/GM EX POWD
Freq: Three times a day (TID) | CUTANEOUS | Status: DC
  Filled 2019-10-30 (×3): qty 15

## 2019-10-30 MED ORDER — ONDANSETRON HCL 4 MG PO TABS
4.0000 mg | ORAL_TABLET | Freq: Four times a day (QID) | ORAL | Status: DC | PRN
  Administered 2019-10-30: 4 mg via ORAL
  Filled 2019-10-30: qty 1

## 2019-10-30 MED ORDER — CITALOPRAM HYDROBROMIDE 40 MG PO TABS
40.0000 mg | ORAL_TABLET | Freq: Every day | ORAL | Status: DC
  Administered 2019-10-30 – 2019-10-31 (×2): 40 mg via ORAL
  Filled 2019-10-30: qty 4
  Filled 2019-10-30: qty 1

## 2019-10-30 NOTE — Consult Note (Signed)
Chief Complaint: Patient was seen in consultation today for liver lesion biopsy Chief Complaint  Patient presents with  . Abdominal Pain  . Abnormal Lab   at the request of Dr Raelyn Mora   Supervising Physician: Jacqulynn Cadet  Patient Status: [facility] - In-pt  History of Present Illness: [name] is a 74 y.o. female   IDDM; temporal arteritis; GERD; migraines; OSA Presented to [facility] ED 5/12 -- PCP informed her of "abnormal labs and need for admission"  Hx breast cancer- 20 yrs ago 3 months abd pain; Nausea; bloating; anorexia/wt loss Increased abd girth; worsening in last 2 weeks Evaluated in ED Imaging: yesterday: CT-IMPRESSION: 1. Diffuse hepatic metastases. 2. Omental caking. Complex cystic mass left lower quadrant consistent with necrotic lesion. 3. Pathologic retroperitoneal lymphadenopathy, most pronounced within the iliac distributions left greater than right. 4. Trace ascites.  Request for tissue diagnosis Dr Laurence Ferrari has reviewed imaging and approves liver lesion biopsy Scheduled for 5/14 in IR  Past Medical History:  Diagnosis Date  . ADD (attention deficit disorder)   . Anxiety   . Arthritis    os  . Diabetes mellitus without complication ([place])   . GERD (gastroesophageal reflux disease)    barretts  . Headache   . High cholesterol   . Neck pain   . Obesity   . Personal history of radiation therapy 2005  . Sleep apnea    cpap   last sleep study> 3 yrs  . Swelling of both ankles   . Weakness 06/2019    Past Surgical History:  Procedure Laterality Date  . ABDOMINAL HYSTERECTOMY    . ANTERIOR CERVICAL DECOMP/DISCECTOMY FUSION N/A 02/15/2016   Procedure: Cervical three four-Cervical five-six, Cervical six-seven Anterior cervical decompression/diskectomy/fusion;  Surgeon: Kristeen Miss, MD;  Location: [facility];  Service: Neurosurgery;  Laterality: N/A;  . bicept surgery    . BREAST LUMPECTOMY    . CHOLECYSTECTOMY    . JOINT  REPLACEMENT    . PAROTID GLAND TUMOR EXCISION    . TONSILLECTOMY    . TOTAL KNEE ARTHROPLASTY  07/19/2012   Procedure: TOTAL KNEE ARTHROPLASTY;  Surgeon: Yvette Rack., MD;  Location: [facility];  Service: Orthopedics;  Laterality: Right;  right total knee arthroplasty  . TUBAL LIGATION      Allergies: Nickel  Medications: Prior to Admission medications   Medication Sig Start Date End Date Taking? Authorizing Provider  citalopram (CELEXA) 40 MG tablet Take 40 mg by mouth daily. 10/24/19  Yes [provider]  fluconazole (DIFLUCAN) 150 MG tablet Take 150 mg by mouth daily.  10/28/19  Yes [provider]  fluticasone (FLONASE) 50 MCG/ACT nasal spray Place 1 spray into the nose 2 (two) times daily.    Yes [provider]  furosemide (LASIX) 80 MG tablet Take 80 mg by mouth daily. 08/01/19  Yes [provider]  pantoprazole (PROTONIX) 40 MG tablet Take 40 mg by mouth daily.   Yes [provider]  simvastatin (ZOCOR) 20 MG tablet Take 20 mg by mouth at bedtime.    Yes [provider]     Family History  Problem Relation Age of Onset  . Breast cancer Maternal Aunt   . Breast cancer Paternal Aunt   . Lung cancer Mother        age 55  . Colon cancer Father        age 86    Social History   Socioeconomic History  . Marital status: Married  Spouse name: Not on file  . Number of children: 2  . Years of education: some college  . Highest education level: Not on file  Occupational History  . Occupation: Retired  Tobacco Use  . Smoking status: Former Smoker    Quit date: 07/11/1997    Years since quitting: 22.3  . Smokeless tobacco: Never Used  Substance and Sexual Activity  . Alcohol use: Not Currently  . Drug use: No  . Sexual activity: Not on file  Other Topics Concern  . Not on file  Social History Narrative   Has her own house but her husband (separated) comes over daily.   32 ounces per day.   Right-handed.   Social  Determinants of Health   Financial Resource Strain:   . Difficulty of Paying Living Expenses:   Food Insecurity:   . Worried About Charity fundraiser in the Last Year:   . Arboriculturist in the Last Year:   Transportation Needs:   . Film/video editor (Medical):   Marland Kitchen Lack of Transportation (Non-Medical):   Physical Activity:   . Days of Exercise per Week:   . Minutes of Exercise per Session:   Stress:   . Feeling of Stress :   Social Connections:   . Frequency of Communication with Friends and Family:   . Frequency of Social Gatherings with Friends and Family:   . Attends Religious Services:   . Active Member of Clubs or Organizations:   . Attends Archivist Meetings:   Marland Kitchen Marital Status:     Review of Systems: A 12 point ROS discussed and pertinent positives are indicated in the HPI above.  All other systems are negative.  Review of Systems  Constitutional: Positive for activity change, appetite change, fatigue and unexpected weight change.  Respiratory: Positive for shortness of breath. Negative for cough.   Cardiovascular: Negative for chest pain.  Gastrointestinal: Positive for abdominal distention, abdominal pain and nausea.  Neurological: Positive for weakness.  Psychiatric/Behavioral: Negative for behavioral problems and confusion.    Vital Signs: BP 122/77 (BP Location: Left Arm)   Pulse (!) 102   Temp 98.1 F (36.7 C) (Oral)   Resp 18   Ht 5\' 4"  (1.626 m)   Wt 235 lb 7.2 oz (106.8 kg)   SpO2 97%   BMI 40.42 kg/m   Physical Exam Vitals reviewed.  Cardiovascular:     Rate and Rhythm: Normal rate and regular rhythm.  Pulmonary:     Effort: Pulmonary effort is normal.     Breath sounds: Normal breath sounds.  Abdominal:     General: Bowel sounds are normal.     Palpations: Abdomen is soft.     Tenderness: There is abdominal tenderness.  Skin:    General: Skin is warm and dry.  Neurological:     Mental Status: She is alert and oriented to  person, place, and time.  Psychiatric:        Behavior: Behavior normal.     Imaging: CT ABDOMEN PELVIS W CONTRAST  Result Date: 10/29/2019 CLINICAL DATA:  Hyperkalemia,, liver disease, emesis, weight loss EXAM: CT ABDOMEN AND PELVIS WITH CONTRAST TECHNIQUE: Multidetector CT imaging of the abdomen and pelvis was performed using the standard protocol following bolus administration of intravenous contrast. CONTRAST:  90mL OMNIPAQUE IOHEXOL 300 MG/ML  SOLN COMPARISON:  06/17/2019, 01/05/2006 FINDINGS: Lower chest: No acute pleural or parenchymal lung disease. Hepatobiliary: There are innumerable masses replacing the majority of the liver  parenchyma consistent with diffuse metastatic disease. The gallbladder is surgically absent. Pancreas: Unremarkable. No pancreatic ductal dilatation or surrounding inflammatory changes. Spleen: Normal in size without focal abnormality. Adrenals/Urinary Tract: Adrenal glands are unremarkable. Kidneys are normal, without renal calculi, focal lesion, or hydronephrosis. Bladder is unremarkable. Stomach/Bowel: No bowel obstruction or ileus. No bowel wall thickening or inflammatory changes. Vascular/Lymphatic: There is pathologic adenopathy within the retroperitoneum. Largest pathologic lymph node in the left external iliac chain reference image 77 measures 3.5 x 3.1 cm. A second index lymph node at the right iliac bifurcation measures 2.6 x 2.2 cm reference image 62. There is minimal atherosclerosis of the aorta. Reproductive: Uterus is surgically absent. I do not see any adnexal lesions. Other: There is soft tissue fat stranding throughout the ventral omentum consistent with omental caking. Complex cystic mass within the left lower quadrant along the anterior abdominal wall measuring 5.7 x 7.2 cm is most consistent with a necrotic mass. Mural thickening along the anterior and lateral margin of the mass. There is trace free fluid within the upper abdomen surrounding the liver and  spleen. No free intra-abdominal gas. Musculoskeletal: There are no acute or destructive bony lesions. Reconstructed images demonstrate no additional findings. IMPRESSION: 1. Diffuse hepatic metastases. 2. Omental caking. Complex cystic mass left lower quadrant consistent with necrotic lesion. 3. Pathologic retroperitoneal lymphadenopathy, most pronounced within the iliac distributions left greater than right. 4. Trace ascites. Electronically Signed   By: Randa Ngo M.D.   On: 10/29/2019 22:15    Labs:  CBC: Recent Labs    06/24/19 0443 06/25/19 0205 10/29/19 1719 10/30/19 0426  WBC 7.5 8.8 12.3* 12.5*  HGB 11.4* 11.8* 12.2 11.1*  HCT 36.1 36.6 40.1 35.4*  PLT 274 308 436* 420*    COAGS: Recent Labs    01/30/19 0450 06/17/19 1248 10/30/19 0015  INR 0.9 0.9 1.1  APTT 32  --  35    BMP: Recent Labs    06/23/19 0517 06/24/19 0443 10/29/19 1857 10/30/19 0426  NA 138 139 133* 135  K 4.9 4.7 3.7 3.6  CL 106 108 94* 100  CO2 22 21* 23 23  GLUCOSE 141* 158* 134* 82  BUN 15 15 29* 24*  CALCIUM 9.3 9.4 10.6* 10.3  CREATININE 1.22* 0.93 1.59* 1.26*  GFRNONAA 44* >60 32* 43*  GFRAA 51* >60 37* 49*    LIVER FUNCTION TESTS: Recent Labs    06/21/19 0232 10/29/19 1857 10/30/19 0015 10/30/19 0426  BILITOT 0.9 2.3* 2.2* 2.3*  AST 34 251* 234* 239*  ALT 66* 47* 43 43  ALKPHOS 164* 2,021* 1,941* 1,970*  PROT 4.9* 6.2* 5.9* 5.7*  ALBUMIN 2.2* 2.2* 2.0* 2.0*    TUMOR MARKERS: No results for input(s): AFPTM, CEA, CA199, CHROMGRNA in the last 8760 hours.  Assessment and Plan:  New abd pain and distension Worsening for few weeks To ED for evaluation  Imaging revealing liver lesion; omental caking and LAN Hx Breast Ca Request for tissue diagnosis Scheduled for liver lesion biopsy 5/14 in IR Risks and benefits of liver lesion bx was discussed with the patient and/or patient's family including, but not limited to bleeding, infection, damage to adjacent structures or  low yield requiring additional tests.  All of the questions were answered and there is agreement to proceed. Consent signed and in chart.   Thank you for this interesting consult.  I greatly enjoyed meeting [name] and look forward to participating in their care.  A copy of this report  was sent to the requesting provider on this date.  Electronically Signed: Lavonia Drafts, PA-C 10/30/2019, 12:04 PM   I spent a total of 40 Minutes    in face to face in clinical consultation, greater than 50% of which was counseling/coordinating care for liver lesion bx

## 2019-10-30 NOTE — Progress Notes (Signed)
Patient ID: [name], female   DOB: Aug 23, 1945, 74 y.o.   MRN: [id number]   IR aware of liver bx request Has been reviewed with Dr Laurence Ferrari He approved procedure Will be prepared and scheduled for 5/14  IR PA will see pt asap

## 2019-10-30 NOTE — Evaluation (Signed)
Physical Therapy Evaluation Patient Details Name: [name] MRN: [id number] DOB: 05/19/1946 Today's Date: 10/30/2019   History of Present Illness  Pt is a 74 y/o female admitted secondary to abdominal distention and pain. 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 Pt likely for liver biopsy on 5/14. PMH includes DM, temporal arteritis, breast cancer, OSA on CPAP, R TKA, and ACDF.   Clinical Impression  Pt admitted secondary to problem above with deficits below. Pt only able to tolerate sitting at EOB this session. Required min A for bed mobility and was reliant on BUE to maintain sitting balance. Per pt, has been in bed most of the time at home, but does perform transfers to [facility]. Feel pt would benefit from SNF level therapies, however, pt reports she would like to go home if able. Will continue to follow acutely to maximize functional mobility independence and safety.    Pt also reports her head feels very heavy and she has hx of cervical spine issues. Requesting a cervical brace; notified RN to discuss with MD.    Follow Up Recommendations SNF;Supervision for mobility/OOB(If pt decides to go home, HHPT)    Equipment Recommendations  Wheelchair cushion (measurements PT);Wheelchair (measurements PT)    Recommendations for Other Services       Precautions / Restrictions Precautions Precautions: Fall Restrictions Weight Bearing Restrictions: No      Mobility  Bed Mobility Overal bed mobility: Needs Assistance Bed Mobility: Supine to Sit;Sit to Supine     Supine to sit: Min assist Sit to supine: Min assist   General bed mobility comments: Min A for trunk elevation and LE assist to come to sitting. Noted pt requiring BUE to maintain sitting balance. Pt requesting to defer further mobility. Min A for LE assist for return to supine.   Transfers                    Ambulation/Gait                Stairs            Wheelchair  Mobility    Modified Rankin (Stroke Patients Only)       Balance Overall balance assessment: Needs assistance Sitting-balance support: Bilateral upper extremity supported;Feet supported Sitting balance-Leahy Scale: Poor Sitting balance - Comments: Reliant on BUE support.                                      Pertinent Vitals/Pain Pain Assessment: No/denies pain    Home Living Family/patient expects to be discharged to:: Private residence Living Arrangements: Spouse/significant other Available Help at Discharge: Family;Available PRN/intermittently Type of Home: House Home Access: Stairs to enter Entrance Stairs-Rails: None Entrance Stairs-Number of Steps: 1 Home Layout: One level Home Equipment: Cane - single point;Walker - 4 wheels;Hand held shower head;Shower seat;Bedside commode      Prior Function Level of Independence: Needs assistance   Gait / Transfers Assistance Needed: Has been staying in the bed most days and transfers to [facility]. Had fall going up step. Has not been ambulating.   ADL's / Homemaking Assistance Needed: Has required assist with sponge bathing. Uses BSC for toileting.         Hand Dominance        Extremity/Trunk Assessment   Upper Extremity Assessment Upper Extremity Assessment: Defer to OT evaluation(noted incresaed weakness in RUE as compared to LUE)  Lower Extremity Assessment Lower Extremity Assessment: RLE deficits/detail;LLE deficits/detail RLE Deficits / Details: 2+/5 in hip flexors, 3+/5 in knee extensors.  LLE Deficits / Details: 3/5 in hip flexors, 4/5 in knee extensors.        Communication   Communication: No difficulties  Cognition Arousal/Alertness: Awake/alert Behavior During Therapy: WFL for tasks assessed/performed Overall Cognitive Status: Within Functional Limits for tasks assessed                                        General Comments General comments (skin integrity, edema,  etc.): Pt was at [facility] at March and did not have good experience. Would prefer to go home if able.     Exercises     Assessment/Plan    PT Assessment Patient needs continued PT services  PT Problem List Decreased strength;Decreased balance;Decreased mobility;Decreased activity tolerance;Decreased knowledge of use of DME;Decreased knowledge of precautions       PT Treatment Interventions Gait training;DME instruction;Functional mobility training;Therapeutic exercise;Therapeutic activities;Stair training;Balance training;Patient/family education    PT Goals (Current goals can be found in the Care Plan section)  Acute Rehab PT Goals Patient Stated Goal: to go home PT Goal Formulation: With patient Time For Goal Achievement: 11/13/19 Potential to Achieve Goals: Fair    Frequency Min 3X/week   Barriers to discharge        Co-evaluation               AM-PAC PT "6 Clicks" Mobility  Outcome Measure Help needed turning from your back to your side while in a flat bed without using bedrails?: A Little Help needed moving from lying on your back to sitting on the side of a flat bed without using bedrails?: A Little Help needed moving to and from a bed to a chair (including a wheelchair)?: A Lot Help needed standing up from a chair using your arms (e.g., wheelchair or bedside chair)?: A Little Help needed to walk in hospital room?: A Lot Help needed climbing 3-5 steps with a railing? : A Lot 6 Click Score: 15    End of Session   Activity Tolerance: Patient limited by fatigue Patient left: in bed;with call bell/phone within reach;with bed alarm set Nurse Communication: Mobility status PT Visit Diagnosis: Muscle weakness (generalized) (M62.81);Difficulty in walking, not elsewhere classified (R26.2);Unsteadiness on feet (R26.81)    Time: [id number] PT Time Calculation (min) (ACUTE ONLY): 16 min   Charges:   PT Evaluation $PT Eval Moderate Complexity: 1 Mod           Reuel Derby, PT, DPT  Acute Rehabilitation Services  Pager: [phone] Office: [phone]   Rudean Hitt 10/30/2019, 3:18 PM

## 2019-10-30 NOTE — ED Notes (Signed)
Pt has purewick 

## 2019-10-30 NOTE — Consult Note (Addendum)
[name]  [phone] Fax:([phone]   MEDICAL ONCOLOGY - INITIAL CONSULTATION  Referral MD: Dr. 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  The patient was sent to the emergency room by her primary care provider due to abnormal lab work.  The patient had been experiencing abdominal pain for about 3 months which was becoming progressively more severe.  3 weeks ago, she started to develop generalized weakness with anorexia, persistent nausea, and occasional bouts of vomiting.  She was seen by her primary care provider for the symptoms and had lab work done.  We do not have access to these labs but her primary care provider told her that they were concerned about the lab work and sent her to the emergency department for evaluation.  Lab work from the ER was significant for a WBC of 12.3, platelet count 436,000, sodium 133, BUN 29, creatinine 1.59, calcium 10.6, total protein 6.2, albumin 2.2, AST 251, ALT 47, alkaline phosphatase 2021, total bilirubin 2.3.  Acute hepatitis panel was negative.  A CT of the abdomen pelvis with contrast was performed on 10/29/2019.  This showed diffuse hepatic metastases, omental caking with a complex cystic mass in the left lower quadrant consistent with necrotic lesion, pathologic retroperitoneal lymphadenopathy most pronounced within the iliac distributions left greater than right, trace ascites.  Of note, the patient had an MRI of the lumbar spine in December 2020 which also showed concern for new retroperitoneal  lymphadenopathy.  She also had a CT of the soft tissue neck on the same date which showed an enlarged mediastinal lymph node. An ultrasound of the abdomen on 06/17/2019 did not show any acute abnormalities, but did show hepatic steatosis.  Today, the patient reports ongoing abdominal pain.  She reports that her appetite has been fair but she has lost about 16 pounds over the past few months.  She has had nausea with intermittent vomiting.  She also reports having headaches and has had double vision for the past 3 to 4 months.  She was recently treated with steroids for temporal arteritis.  She was on this for about November 2020 and completed her last dose in early April 2021.  She has not had any recent fevers or chills.  Denies chest pain or shortness of breath.  Denies recent bleeding.  States that she is seen by Dr. Watt Climes from [place] and was treated for an ulcer about 4 years ago.  She states that her last upper endoscopy and colonoscopy was around this time.  She states that she has routine mammograms. The patient lives in [place], [place].  She is married.  She has 2 adult children.  Denies history of alcohol use.  Has a history of smoking 1 pack of cigarettes per day for about 40 years.  She quit smoking in 1999.  Family history significant for a mother with lung cancer, father with colon cancer, and 3 maternal aunts with breast cancer.  Medical oncology was asked see the patient to make recommendations regarding her abnormal CT scan findings.   Past Medical History:  Diagnosis Date  . ADD (attention deficit disorder)   .  Anxiety   . Arthritis    os  . Breast cancer (HCC)-high-grade DCIS, ER negative, PR negative  2000  . Diabetes mellitus without complication ([place])   . GERD (gastroesophageal reflux disease)    barretts  . Headache   . High cholesterol   . Neck pain   . Obesity   . Personal history of radiation therapy 2005  . Sleep apnea    cpap   last sleep study> 3 yrs  .  Swelling of both ankles   . Weakness 06/2019  :  Past Surgical History:  Procedure Laterality Date  . ABDOMINAL HYSTERECTOMY    . ANTERIOR CERVICAL DECOMP/DISCECTOMY FUSION N/A 02/15/2016   Procedure: Cervical three four-Cervical five-six, Cervical six-seven Anterior cervical decompression/diskectomy/fusion;  Surgeon: Kristeen Miss, MD;  Location: [facility];  Service: Neurosurgery;  Laterality: N/A;  . bicept surgery    . BREAST LUMPECTOMY    . CHOLECYSTECTOMY    . JOINT REPLACEMENT    . PAROTID GLAND TUMOR EXCISION    . TONSILLECTOMY    . TOTAL KNEE ARTHROPLASTY  07/19/2012   Procedure: TOTAL KNEE ARTHROPLASTY;  Surgeon: Yvette Rack., MD;  Location: [facility];  Service: Orthopedics;  Laterality: Right;  right total knee arthroplasty  . TUBAL LIGATION    :  Current Facility-Administered Medications  Medication Dose Route Frequency Provider Last Rate Last Admin  . 0.9 %  sodium chloride infusion   Intravenous Continuous Shalhoub, Sherryll Burger, MD 125 mL/hr at 10/30/19 1155 IV Pump Association at 10/30/19 1155  . acetaminophen (TYLENOL) tablet 650 mg  650 mg Oral Q6H PRN Shalhoub, Sherryll Burger, MD       Or  . acetaminophen (TYLENOL) suppository 650 mg  650 mg Rectal Q6H PRN Shalhoub, Sherryll Burger, MD      . citalopram (CELEXA) tablet 40 mg  40 mg Oral Daily Shalhoub, Sherryll Burger, MD   40 mg at 10/30/19 1107  . fluticasone (FLONASE) 50 MCG/ACT nasal spray 1 spray  1 spray Each Nare BID Shalhoub, Sherryll Burger, MD      . insulin aspart (novoLOG) injection 0-15 Units  0-15 Units Subcutaneous TID AC & HS Shalhoub, Sherryll Burger, MD      . oxyCODONE (Oxy IR/ROXICODONE) immediate release tablet 5 mg  5 mg Oral Q4H PRN Vernelle Emerald, MD   5 mg at 10/29/19 2341   Or  . morphine 2 MG/ML injection 2 mg  2 mg Intravenous Q4H PRN Shalhoub, Sherryll Burger, MD      . ondansetron [facility]) tablet 4 mg  4 mg Oral Q6H PRN Shalhoub, Sherryll Burger, MD       Or  . ondansetron (ZOFRAN) injection 4 mg  4 mg Intravenous Q6H PRN Shalhoub,  Sherryll Burger, MD      . pantoprazole (PROTONIX) injection 40 mg  40 mg Intravenous Q24H Vernelle Emerald, MD   40 mg at 10/30/19 0136  . polyethylene glycol (MIRALAX / GLYCOLAX) packet 17 g  17 g Oral Daily PRN Shalhoub, Sherryll Burger, MD         Allergies  Allergen Reactions  . Nickel Hives and Rash  :  Family History  Problem Relation Age of Onset  . Breast cancer Maternal Aunt   . Breast cancer Paternal Aunt   . Lung cancer Mother        age 15  . Colon cancer Father        age 52  :  Social History   Socioeconomic  History  . Marital status: Married    Spouse name: Not on file  . Number of children: 2  . Years of education: some college  . Highest education level: Not on file  Occupational History  . Occupation: Retired  Tobacco Use  . Smoking status: Former Smoker    Quit date: 07/11/1997    Years since quitting: 22.3  . Smokeless tobacco: Never Used  Substance and Sexual Activity  . Alcohol use: Not Currently  . Drug use: No  . Sexual activity: Not on file  Other Topics Concern  . Not on file  Social History Narrative   Has her own house but her husband (separated) comes over daily.   32 ounces per day.   Right-handed.   Social Determinants of Health   Financial Resource Strain:   . Difficulty of Paying Living Expenses:   Food Insecurity:   . Worried About Charity fundraiser in the Last Year:   . Arboriculturist in the Last Year:   Transportation Needs:   . Film/video editor (Medical):   Marland Kitchen Lack of Transportation (Non-Medical):   Physical Activity:   . Days of Exercise per Week:   . Minutes of Exercise per Session:   Stress:   . Feeling of Stress :   Social Connections:   . Frequency of Communication with Friends and Family:   . Frequency of Social Gatherings with Friends and Family:   . Attends Religious Services:   . Active Member of Clubs or Organizations:   . Attends Archivist Meetings:   Marland Kitchen Marital Status:   Intimate Partner  Violence:   . Fear of Current or Ex-Partner:   . Emotionally Abused:   Marland Kitchen Physically Abused:   . Sexually Abused:   :  Review of Systems: A comprehensive 14 point review of systems was negative except as noted in the HPI.  Exam: Patient Vitals for the past 24 hrs:  BP Temp Temp src Pulse Resp SpO2 Height Weight  10/30/19 1157 122/77 98.1 F (36.7 C) Oral (!) 102 18 97 % 5\' 4"  (1.626 m) 106.8 kg  10/30/19 1100 122/75 98.5 F (36.9 C) Oral 98 19 94 % -- --  10/30/19 0900 110/67 -- -- (!) 105 -- 95 % -- --  10/30/19 0800 116/69 -- -- 96 (!) 30 94 % -- --  10/30/19 0700 117/62 -- -- 100 18 95 % -- --  10/30/19 0600 110/65 -- -- 96 (!) 24 93 % -- --  10/30/19 0415 125/72 -- -- (!) 102 18 95 % -- --  10/30/19 0400 115/68 -- -- (!) 101 15 93 % -- --  10/30/19 0345 (!) 112/57 -- -- (!) 111 19 95 % -- --  10/30/19 0330 110/67 -- -- (!) 103 16 95 % -- --  10/30/19 0315 114/66 -- -- (!) 101 16 95 % -- --  10/30/19 0200 114/67 -- -- 97 15 96 % -- --  10/30/19 0130 117/62 -- -- 95 15 96 % -- --  10/30/19 0100 122/65 -- -- 95 16 95 % -- --  10/30/19 0000 117/64 -- -- -- 18 -- -- --  10/29/19 2330 (!) 123/59 -- -- 94 18 96 % -- --  10/29/19 2300 117/66 -- -- 91 (!) 31 95 % -- --  10/29/19 2215 118/68 -- -- 92 18 96 % -- --  10/29/19 2115 124/69 -- -- 90 17 94 % -- --  10/29/19 2100 121/74 -- --  89 17 94 % -- --  10/29/19 2045 123/78 -- -- 90 17 94 % -- --  10/29/19 2030 120/76 -- -- 92 17 95 % -- --  10/29/19 1915 -- -- -- -- 14 -- -- --  10/29/19 1900 -- -- -- 95 19 96 % -- --  10/29/19 1845 118/84 -- -- -- 19 -- -- --  10/29/19 1831 111/75 98.6 F (37 C) -- 100 18 96 % -- --  10/29/19 1617 112/69 97.6 F (36.4 C) Oral 98 20 98 % -- --  10/29/19 1616 -- -- -- -- -- -- 5\' 4"  (1.626 m) 101.2 kg    General: Chronically ill-appearing female, no distress Eyes:  no scleral icterus.   ENT:  There were no oropharyngeal lesions.   Neck was without thyromegaly.   Lymphatics: 1 cm left  scalene lymph node palpated otherwise no palpable cervical, supraclavicular, axillary, or inguinal lymphadenopathy. Respiratory: lungs were clear bilaterally without wheezing or crackles.   Cardiovascular:  Regular rate and rhythm, S1/S2, without murmur, rub or gallop.  There was no pedal edema.   GI: Positive bowel sounds, soft, nontender, hepatomegaly noted. Musculoskeletal: No spinal tenderness with palpation.  Strength 4/5 in the right upper extremity and 5/5 in the left upper extremity.  Bilateral lower extremities with 4/5 strength. Skin exam was without echymosis, petichae.  Rash secondary to yeast infection noted in her bilateral groin. Neuro exam was nonfocal. Patient was alert and oriented.  Attention was good.   Language was appropriate.  Mood was normal without depression.  Speech was not pressured.  Thought content was not tangential.     Lab Results  Component Value Date   WBC 12.5 (H) 10/30/2019   HGB 11.1 (L) 10/30/2019   HCT 35.4 (L) 10/30/2019   PLT 420 (H) 10/30/2019   GLUCOSE 82 10/30/2019   CHOL 158 01/31/2019   TRIG 95 01/31/2019   HDL 67 01/31/2019   LDLCALC 72 01/31/2019   ALT 43 10/30/2019   AST 239 (H) 10/30/2019   NA 135 10/30/2019   K 3.6 10/30/2019   CL 100 10/30/2019   CREATININE 1.26 (H) 10/30/2019   BUN 24 (H) 10/30/2019   CO2 23 10/30/2019    CT ABDOMEN PELVIS W CONTRAST  Result Date: 10/29/2019 CLINICAL DATA:  Hyperkalemia,, liver disease, emesis, weight loss EXAM: CT ABDOMEN AND PELVIS WITH CONTRAST TECHNIQUE: Multidetector CT imaging of the abdomen and pelvis was performed using the standard protocol following bolus administration of intravenous contrast. CONTRAST:  50mL OMNIPAQUE IOHEXOL 300 MG/ML  SOLN COMPARISON:  06/17/2019, 01/05/2006 FINDINGS: Lower chest: No acute pleural or parenchymal lung disease. Hepatobiliary: There are innumerable masses replacing the majority of the liver parenchyma consistent with diffuse metastatic disease. The  gallbladder is surgically absent. Pancreas: Unremarkable. No pancreatic ductal dilatation or surrounding inflammatory changes. Spleen: Normal in size without focal abnormality. Adrenals/Urinary Tract: Adrenal glands are unremarkable. Kidneys are normal, without renal calculi, focal lesion, or hydronephrosis. Bladder is unremarkable. Stomach/Bowel: No bowel obstruction or ileus. No bowel wall thickening or inflammatory changes. Vascular/Lymphatic: There is pathologic adenopathy within the retroperitoneum. Largest pathologic lymph node in the left external iliac chain reference image 77 measures 3.5 x 3.1 cm. A second index lymph node at the right iliac bifurcation measures 2.6 x 2.2 cm reference image 62. There is minimal atherosclerosis of the aorta. Reproductive: Uterus is surgically absent. I do not see any adnexal lesions. Other: There is soft tissue fat stranding throughout the ventral omentum consistent with omental  caking. Complex cystic mass within the left lower quadrant along the anterior abdominal wall measuring 5.7 x 7.2 cm is most consistent with a necrotic mass. Mural thickening along the anterior and lateral margin of the mass. There is trace free fluid within the upper abdomen surrounding the liver and spleen. No free intra-abdominal gas. Musculoskeletal: There are no acute or destructive bony lesions. Reconstructed images demonstrate no additional findings. IMPRESSION: 1. Diffuse hepatic metastases. 2. Omental caking. Complex cystic mass left lower quadrant consistent with necrotic lesion. 3. Pathologic retroperitoneal lymphadenopathy, most pronounced within the iliac distributions left greater than right. 4. Trace ascites. Electronically Signed   By: Randa Ngo M.D.   On: 10/29/2019 22:15     CT ABDOMEN PELVIS W CONTRAST  Result Date: 10/29/2019 CLINICAL DATA:  Hyperkalemia,, liver disease, emesis, weight loss EXAM: CT ABDOMEN AND PELVIS WITH CONTRAST TECHNIQUE: Multidetector CT imaging of  the abdomen and pelvis was performed using the standard protocol following bolus administration of intravenous contrast. CONTRAST:  84mL OMNIPAQUE IOHEXOL 300 MG/ML  SOLN COMPARISON:  06/17/2019, 01/05/2006 FINDINGS: Lower chest: No acute pleural or parenchymal lung disease. Hepatobiliary: There are innumerable masses replacing the majority of the liver parenchyma consistent with diffuse metastatic disease. The gallbladder is surgically absent. Pancreas: Unremarkable. No pancreatic ductal dilatation or surrounding inflammatory changes. Spleen: Normal in size without focal abnormality. Adrenals/Urinary Tract: Adrenal glands are unremarkable. Kidneys are normal, without renal calculi, focal lesion, or hydronephrosis. Bladder is unremarkable. Stomach/Bowel: No bowel obstruction or ileus. No bowel wall thickening or inflammatory changes. Vascular/Lymphatic: There is pathologic adenopathy within the retroperitoneum. Largest pathologic lymph node in the left external iliac chain reference image 77 measures 3.5 x 3.1 cm. A second index lymph node at the right iliac bifurcation measures 2.6 x 2.2 cm reference image 62. There is minimal atherosclerosis of the aorta. Reproductive: Uterus is surgically absent. I do not see any adnexal lesions. Other: There is soft tissue fat stranding throughout the ventral omentum consistent with omental caking. Complex cystic mass within the left lower quadrant along the anterior abdominal wall measuring 5.7 x 7.2 cm is most consistent with a necrotic mass. Mural thickening along the anterior and lateral margin of the mass. There is trace free fluid within the upper abdomen surrounding the liver and spleen. No free intra-abdominal gas. Musculoskeletal: There are no acute or destructive bony lesions. Reconstructed images demonstrate no additional findings. IMPRESSION: 1. Diffuse hepatic metastases. 2. Omental caking. Complex cystic mass left lower quadrant consistent with necrotic lesion. 3.  Pathologic retroperitoneal lymphadenopathy, most pronounced within the iliac distributions left greater than right. 4. Trace ascites. Electronically Signed   By: Randa Ngo M.D.   On: 10/29/2019 22:15   Assessment and Plan:  1.  Hepatic metastases, omental caking, and retroperitoneal lymphadenopathy concerning for underlying malignancy 2.  Elevated total bilirubin and LFTs 3.  AKI secondary to dehydration 4.  Hypercalcemia 5.  Deconditioning 6.  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  Diabetes mellitus 8.  Temporal arteritis, status post treatment 9.  Migraine headaches 10.  Leg weakness-steroid myopathy?,  Paraneoplastic syndrome?  Ms. Masis is now admitted secondary to abdominal pain.  CT scan concerning for metastatic disease.  Primary site not clear but suspect may be a GI malignancy given pattern of disease on CT scan and less likely thought to be due to recurrent breast cancer.  She was also noted to have an enlarged mediastinal lymph node on a  CT of the neck in December 2020 and has a history of smoking.  She has significantly elevated LFTs and elevated total bilirubin.  Recommendations: 1.  We will obtain a CT of the chest to evaluate for metastatic disease to this area. 2.  Await tumor markers including a CEA, CA 19.9, AFP, and CA 27.29. 3.  Proceed with ultrasound-guided liver biopsy by interventional radiology.  This is scheduled for 10/31/2019.  Will await biopsy results. 4.  Recommend GI consult by Dr. Watt Climes.  5.  Her pain is not currently well controlled.  She is taking Tylenol only.  She has oxycodone and morphine ordered.  Recommend that she be offered these as needed medications to help control her pain. 6.  Nystatin powder has been ordered for her rash in her bilateral groin. 7.  Recommend PT/OT evaluation due to deconditioning. 8.  Consider neurology evaluation 9.  IV fluids with elevated creatinine and  plan for n.p.o. tonight   Thank you for this referral.   Mikey Bussing, DNP, AGPCNP-BC, AOCNP  Ms. [name] was interviewed and examined.  CT images reviewed.  Ms. [name] presents with abdominal pain, intermittent vomiting, and leg weakness.  The clinical presentation and CT is consistent with a metastatic malignancy.  I suspect a gastrointestinal primary.  She has been referred for a liver biopsy.  We will follow up on the biopsy result and recommend additional diagnostic/staging evaluation as indicated.  I will contact Dr. Watt Climes to let him know she is here.  An upper endoscopy may be indicated.  The leg weakness may be related to steroid myopathy or a paraneoplastic syndrome.  I will check on this with tomorrow and again on 11/03/2019 if she remains in the hospital.  Outpatient follow-up will be scheduled at the Cancer center.

## 2019-10-30 NOTE — Progress Notes (Signed)
PROGRESS NOTE    [name]  [id number] DOB: 06-22-1945 DOA: 10/29/2019 PCP: Leonides Sake, MD    Brief Narrative:  Patient is 74 year old female with history of type 2 diabetes on insulin, temporal arteritis recently treated with very high dose of steroids, GERD, migraine headaches, obstructive sleep apnea who presents to the emergency room with abnormal lab from her primary care physician's office. Patient has history of breast cancer that was treated with lumpectomy and radiation more than 20 years ago.  Has history of hysterectomy and bilateral oophorectomy when she was 74 year old for abnormal bleeding. Patient was having some symptoms including anorexia, and abdominal bloating, poor appetite for about 3 months now.  Patient also has more ambulatory problems and has been using walker for last 2 weeks.  She was on very high-dose steroids and attributed some of the symptoms to those medications.  She was weaned off his steroids about 5 weeks ago.  For the last few days she is not able to eat, has nausea and no appetite.  Patient also has progressive increase in size of her abdominal girth.  Pain is all over the abdomen, mostly in the right upper quadrant.  She went to her primary care physician's office, had elevated alkaline phosphatase and LFTs and was sent to ER. In the emergency room, hemodynamically stable.  Clinically dehydrated.  CT scan of the abdomen pelvis showed diffuse hepatic metastatic disease, omental caking, complex cyst on the left lower quadrant, multiple lymphadenopathy.  With clinical dehydration and possible new diagnosis of cancer, patient was admitted to the hospital.   Assessment & Plan:   Principal Problem:   Metastatic neoplastic disease ([place]) Active Problems:   Uncontrolled type 2 diabetes mellitus with hyperglycemia, with long-term current use of insulin (HCC)   Hypercalcemia   AKI (acute kidney injury) ([place])   Dehydration with hyponatremia   GERD  without esophagitis  Suspected metastatic disease: Primary source unknown.  Probable metastatic breast cancer. Case discussed with interventional radiology, scheduled for ultrasound-guided liver biopsy. CEA, CA 19 sent. Symptomatic treatment now. Discussed with oncology, patient will be seen in consultation for further management plan.  Acute kidney injury: With ongoing poor appetite and intake.  Continue maintenance IV fluids.  Encourage oral intake.  Continue to monitor renal functions.  Type 2 diabetes uncontrolled with hyperglycemia: Currently with poor appetite and n.p.o.  Checking A1c.  Keep on sliding scale insulin until normal appetite.  Dehydration with hyponatremia: Treated with isotonic fluid with improvement.  Continue.  GERD without esophagitis: On Protonix at home.  Continue IV.  Unable to take reliable oral intake.   DVT prophylaxis: SCDs Code Status: Full code Family Communication: Husband at the bedside Disposition Plan: Status is: Observation  The patient will require care spanning > 2 midnights and should be moved to inpatient because: Persistent severe electrolyte disturbances, Ongoing diagnostic testing needed not appropriate for outpatient work up and Inpatient level of care appropriate due to severity of illness  Dispo: The patient is from: Home              Anticipated d/c is to: SNF              Anticipated d/c date is: 2 days              Patient currently is not medically stable to d/c.         Consultants:   Oncology  Interventional radiology  Procedures:   None  Antimicrobials:   None  Subjective: Patient seen and examined.  She is waiting in the emergency room for inpatient bed assignment.  Husband arrived later.  Patient is informed about possible diagnosis and plan for biopsy. She just have poor appetite and does not really want to eat.  Currently denies any complaints.  Objective: Vitals:   10/30/19 0600 10/30/19 0700 10/30/19  0800 10/30/19 0900  BP: 110/65 117/62 116/69 110/67  Pulse: 96 100 96 (!) 105  Resp: (!) 24 18 (!) 30   Temp:      TempSrc:      SpO2: 93% 95% 94% 95%  Weight:      Height:       No intake or output data in the 24 hours ending 10/30/19 1037 Filed Weights   10/29/19 1616  Weight: 101.2 kg    Examination:  General exam: Appears calm and comfortable, chronically sick looking. Respiratory system: Clear to auscultation. Respiratory effort normal. Cardiovascular system: S1 & S2 heard, RRR. No JVD, murmurs, rubs, gallops or clicks. No pedal edema. Gastrointestinal system: Abdomen is distended.  Palpable nodular liver.  Obese and pendulous.  No localized tenderness rigidity or guarding. Central nervous system: Alert and oriented. No focal neurological deficits. Extremities: Symmetric 5 x 5 power. Skin: No rashes, lesions or ulcers Psychiatry: Judgement and insight appear normal. Mood & affect appropriate.     Data Reviewed: I have personally reviewed following labs and imaging studies  CBC: Recent Labs  Lab 10/29/19 1719 10/30/19 0426  WBC 12.3* 12.5*  NEUTROABS  --  10.5*  HGB 12.2 11.1*  HCT 40.1 35.4*  MCV 95.5 92.9  PLT 436* 0000000*   Basic Metabolic Panel: Recent Labs  Lab 10/29/19 1857 10/30/19 0426  NA 133* 135  K 3.7 3.6  CL 94* 100  CO2 23 23  GLUCOSE 134* 82  BUN 29* 24*  CREATININE 1.59* 1.26*  CALCIUM 10.6* 10.3  MG  --  1.9   GFR: Estimated Creatinine Clearance: 46.7 mL/min (A) (by C-G formula based on SCr of 1.26 mg/dL (H)). Liver Function Tests: Recent Labs  Lab 10/29/19 1857 10/30/19 0015 10/30/19 0426  AST 251* 234* 239*  ALT 47* 43 43  ALKPHOS 2,021* 1,941* 1,970*  BILITOT 2.3* 2.2* 2.3*  PROT 6.2* 5.9* 5.7*  ALBUMIN 2.2* 2.0* 2.0*   Recent Labs  Lab 10/29/19 1857  LIPASE 33   No results for input(s): AMMONIA in the last 168 hours. Coagulation Profile: Recent Labs  Lab 10/30/19 0015  INR 1.1   Cardiac Enzymes: No results for  input(s): CKTOTAL, CKMB, CKMBINDEX, TROPONINI in the last 168 hours. BNP (last 3 results) No results for input(s): PROBNP in the last 8760 hours. HbA1C: Recent Labs    10/29/19 2329  HGBA1C 4.8   CBG: Recent Labs  Lab 10/30/19 0821  GLUCAP 73   Lipid Profile: No results for input(s): CHOL, HDL, LDLCALC, TRIG, CHOLHDL, LDLDIRECT in the last 72 hours. Thyroid Function Tests: Recent Labs    10/29/19 2330  TSH 1.696   Anemia Panel: No results for input(s): VITAMINB12, FOLATE, FERRITIN, TIBC, IRON, RETICCTPCT in the last 72 hours. Sepsis Labs: Recent Labs  Lab 10/30/19 0015 10/30/19 0427  LATICACIDVEN 3.1* 2.6*    Recent Results (from the past 240 hour(s))  SARS Coronavirus 2 by RT PCR (hospital order, performed in [facility] hospital lab) Nasopharyngeal Nasopharyngeal Swab     Status: None   Collection Time: 10/29/19 10:49 PM   Specimen: Nasopharyngeal Swab  Result Value Ref Range Status  SARS Coronavirus 2 NEGATIVE NEGATIVE Final    Comment: (NOTE) SARS-CoV-2 target nucleic acids are NOT DETECTED. The SARS-CoV-2 RNA is generally detectable in upper and lower respiratory specimens during the acute phase of infection. The lowest concentration of SARS-CoV-2 viral copies this assay can detect is 250 copies / mL. A negative result does not preclude SARS-CoV-2 infection and should not be used as the sole basis for treatment or other patient management decisions.  A negative result may occur with improper specimen collection / handling, submission of specimen other than nasopharyngeal swab, presence of viral mutation(s) within the areas targeted by this assay, and inadequate number of viral copies (<250 copies / mL). A negative result must be combined with clinical observations, patient history, and epidemiological information. Fact Sheet for Patients:   StrictlyIdeas.no Fact Sheet for Healthcare  Providers: BankingDealers.co.za This test is not yet approved or cleared  by the [place] FDA and has been authorized for detection and/or diagnosis of SARS-CoV-2 by FDA under an Emergency Use Authorization (EUA).  This EUA will remain in effect (meaning this test can be used) for the duration of the COVID-19 declaration under Section 564(b)(1) of the Act, 21 U.S.C. section 360bbb-3(b)(1), unless the authorization is terminated or revoked sooner. Performed at [facility], [place] [address], [place], [place] [address]          Radiology Studies: CT ABDOMEN PELVIS W CONTRAST  Result Date: 10/29/2019 CLINICAL DATA:  Hyperkalemia,, liver disease, emesis, weight loss EXAM: CT ABDOMEN AND PELVIS WITH CONTRAST TECHNIQUE: Multidetector CT imaging of the abdomen and pelvis was performed using the standard protocol following bolus administration of intravenous contrast. CONTRAST:  32mL OMNIPAQUE IOHEXOL 300 MG/ML  SOLN COMPARISON:  06/17/2019, 01/05/2006 FINDINGS: Lower chest: No acute pleural or parenchymal lung disease. Hepatobiliary: There are innumerable masses replacing the majority of the liver parenchyma consistent with diffuse metastatic disease. The gallbladder is surgically absent. Pancreas: Unremarkable. No pancreatic ductal dilatation or surrounding inflammatory changes. Spleen: Normal in size without focal abnormality. Adrenals/Urinary Tract: Adrenal glands are unremarkable. Kidneys are normal, without renal calculi, focal lesion, or hydronephrosis. Bladder is unremarkable. Stomach/Bowel: No bowel obstruction or ileus. No bowel wall thickening or inflammatory changes. Vascular/Lymphatic: There is pathologic adenopathy within the retroperitoneum. Largest pathologic lymph node in the left external iliac chain reference image 77 measures 3.5 x 3.1 cm. A second index lymph node at the right iliac bifurcation measures 2.6 x 2.2 cm reference image 62. There is minimal  atherosclerosis of the aorta. Reproductive: Uterus is surgically absent. I do not see any adnexal lesions. Other: There is soft tissue fat stranding throughout the ventral omentum consistent with omental caking. Complex cystic mass within the left lower quadrant along the anterior abdominal wall measuring 5.7 x 7.2 cm is most consistent with a necrotic mass. Mural thickening along the anterior and lateral margin of the mass. There is trace free fluid within the upper abdomen surrounding the liver and spleen. No free intra-abdominal gas. Musculoskeletal: There are no acute or destructive bony lesions. Reconstructed images demonstrate no additional findings. IMPRESSION: 1. Diffuse hepatic metastases. 2. Omental caking. Complex cystic mass left lower quadrant consistent with necrotic lesion. 3. Pathologic retroperitoneal lymphadenopathy, most pronounced within the iliac distributions left greater than right. 4. Trace ascites. Electronically Signed   By: Randa Ngo M.D.   On: 10/29/2019 22:15        Scheduled Meds: . citalopram  40 mg Oral Daily  . fluticasone  1 spray Each Nare BID  .  insulin aspart  0-15 Units Subcutaneous TID AC & HS  . pantoprazole (PROTONIX) IV  40 mg Intravenous Q24H   Continuous Infusions: . sodium chloride 125 mL/hr at 10/30/19 1022     LOS: 0 days    Time spent: 30 minutes    Barb Merino, MD [facility] Pager [phone]

## 2019-10-30 NOTE — Progress Notes (Signed)
New Admission Note: Patient admitted from the [facility] ED to room 5M21  Arrival Method: via stretcher Mental Orientation: Alert and oriented x 4 Telemetry: N/A Assessment: Completed Skin: rash in abdominal folds IV: L fa  Pain: Denies Tubes: [place] Safety Measures: Safety Fall Prevention Plan has been given, discussed and signed Admission: Completed [facility] [place] Orientation: Patient has been orientated to the room, unit and staff.  Family: None at the bedside  Orders have been reviewed and implemented. Will continue to monitor the patient. Call light has been placed within reach and bed alarm has been activated.   Dorthey Sawyer, RN

## 2019-10-30 NOTE — ED Notes (Signed)
Pt lying with both eyes closed

## 2019-10-31 ENCOUNTER — Inpatient Hospital Stay (HOSPITAL_COMMUNITY): Payer: Medicare Other

## 2019-10-31 DIAGNOSIS — C799 Secondary malignant neoplasm of unspecified site: Secondary | ICD-10-CM

## 2019-10-31 LAB — CBC WITH DIFFERENTIAL/PLATELET
Abs Immature Granulocytes: 0.28 10*3/uL — ABNORMAL HIGH (ref 0.00–0.07)
Basophils Absolute: 0.1 10*3/uL (ref 0.0–0.1)
Basophils Relative: 1 %
Eosinophils Absolute: 0.2 10*3/uL (ref 0.0–0.5)
Eosinophils Relative: 2 %
HCT: 34.3 % — ABNORMAL LOW (ref 36.0–46.0)
Hemoglobin: 10.5 g/dL — ABNORMAL LOW (ref 12.0–15.0)
Immature Granulocytes: 2 %
Lymphocytes Relative: 10 %
Lymphs Abs: 1.2 10*3/uL (ref 0.7–4.0)
MCH: 29.2 pg (ref 26.0–34.0)
MCHC: 30.6 g/dL (ref 30.0–36.0)
MCV: 95.5 fL (ref 80.0–100.0)
Monocytes Absolute: 1.5 10*3/uL — ABNORMAL HIGH (ref 0.1–1.0)
Monocytes Relative: 13 %
Neutro Abs: 8.6 10*3/uL — ABNORMAL HIGH (ref 1.7–7.7)
Neutrophils Relative %: 72 %
Platelets: 337 10*3/uL (ref 150–400)
RBC: 3.59 MIL/uL — ABNORMAL LOW (ref 3.87–5.11)
RDW: 23.9 % — ABNORMAL HIGH (ref 11.5–15.5)
WBC: 12 10*3/uL — ABNORMAL HIGH (ref 4.0–10.5)
nRBC: 0.3 % — ABNORMAL HIGH (ref 0.0–0.2)

## 2019-10-31 LAB — COMPREHENSIVE METABOLIC PANEL WITH GFR
ALT: 42 U/L (ref 0–44)
AST: 239 U/L — ABNORMAL HIGH (ref 15–41)
Albumin: 1.7 g/dL — ABNORMAL LOW (ref 3.5–5.0)
Alkaline Phosphatase: 924 U/L — ABNORMAL HIGH (ref 38–126)
Anion gap: 12 (ref 5–15)
BUN: 19 mg/dL (ref 8–23)
CO2: 23 mmol/L (ref 22–32)
Calcium: 10.3 mg/dL (ref 8.9–10.3)
Chloride: 103 mmol/L (ref 98–111)
Creatinine, Ser: 0.96 mg/dL (ref 0.44–1.00)
GFR calc Af Amer: >60 mL/min
GFR calc non Af Amer: 59 mL/min — ABNORMAL LOW
Glucose, Bld: 85 mg/dL (ref 70–99)
Potassium: 4 mmol/L (ref 3.5–5.1)
Sodium: 138 mmol/L (ref 135–145)
Total Bilirubin: 2.7 mg/dL — ABNORMAL HIGH (ref 0.3–1.2)
Total Protein: 5.4 g/dL — ABNORMAL LOW (ref 6.5–8.1)

## 2019-10-31 LAB — GLUCOSE, CAPILLARY
Glucose-Capillary: 114 mg/dL — ABNORMAL HIGH (ref 70–99)
Glucose-Capillary: 75 mg/dL (ref 70–99)
Glucose-Capillary: 84 mg/dL (ref 70–99)
Glucose-Capillary: 84 mg/dL (ref 70–99)
Glucose-Capillary: 88 mg/dL (ref 70–99)
Glucose-Capillary: 96 mg/dL (ref 70–99)

## 2019-10-31 LAB — AFP TUMOR MARKER: AFP, Serum, Tumor Marker: 2.1 ng/mL (ref 0.0–8.3)

## 2019-10-31 LAB — CEA: CEA: 4.8 ng/mL — ABNORMAL HIGH (ref 0.0–4.7)

## 2019-10-31 LAB — PHOSPHORUS: Phosphorus: 2.8 mg/dL (ref 2.5–4.6)

## 2019-10-31 LAB — MAGNESIUM: Magnesium: 1.8 mg/dL (ref 1.7–2.4)

## 2019-10-31 LAB — CALCIUM, IONIZED: Calcium, Ionized, Serum: 6.1 mg/dL — ABNORMAL HIGH (ref 4.5–5.6)

## 2019-10-31 LAB — CANCER ANTIGEN 19-9: CA 19-9: 441 U/mL — ABNORMAL HIGH (ref 0–35)

## 2019-10-31 LAB — CANCER ANTIGEN 27.29: CA 27.29: 6728 U/mL — ABNORMAL HIGH (ref 0.0–38.6)

## 2019-10-31 MED ORDER — FENTANYL CITRATE (PF) 100 MCG/2ML IJ SOLN
INTRAMUSCULAR | Status: AC
  Filled 2019-10-31: qty 2

## 2019-10-31 MED ORDER — LIDOCAINE HCL (PF) 1 % IJ SOLN
INTRAMUSCULAR | Status: AC
  Filled 2019-10-31: qty 30

## 2019-10-31 MED ORDER — CITALOPRAM HYDROBROMIDE 20 MG PO TABS
20.0000 mg | ORAL_TABLET | Freq: Every day | ORAL | Status: DC
  Administered 2019-11-01 – 2019-11-05 (×5): 20 mg via ORAL
  Filled 2019-10-31 (×5): qty 1

## 2019-10-31 MED ORDER — GELATIN ABSORBABLE 12-7 MM EX MISC
CUTANEOUS | Status: AC
  Filled 2019-10-31: qty 1

## 2019-10-31 MED ORDER — ENSURE ENLIVE PO LIQD
237.0000 mL | Freq: Three times a day (TID) | ORAL | Status: DC
  Administered 2019-10-31 – 2019-11-19 (×44): 237 mL via ORAL

## 2019-10-31 MED ORDER — GADOBUTROL 1 MMOL/ML IV SOLN
10.0000 mL | Freq: Once | INTRAVENOUS | Status: AC | PRN
  Administered 2019-10-31: 10 mL via INTRAVENOUS

## 2019-10-31 MED ORDER — MIDAZOLAM HCL 2 MG/2ML IJ SOLN
INTRAMUSCULAR | Status: AC | PRN
  Administered 2019-10-31: 1 mg via INTRAVENOUS

## 2019-10-31 MED ORDER — MIDAZOLAM HCL 2 MG/2ML IJ SOLN
INTRAMUSCULAR | Status: AC
  Filled 2019-10-31: qty 2

## 2019-10-31 NOTE — Progress Notes (Addendum)
Initial Nutrition Assessment  DOCUMENTATION CODES:   Morbid obesity  INTERVENTION:  Ensure Enlive po TID, each supplement provides 350 kcal and 20 grams of protein   NUTRITION DIAGNOSIS:   Inadequate oral intake related to poor appetite as evidenced by per patient/family report.    GOAL:   Patient will meet greater than or equal to 90% of their needs    MONITOR:   PO intake, Supplement acceptance, Weight trends, Labs, I & O's  REASON FOR ASSESSMENT:   Malnutrition Screening Tool    ASSESSMENT:   Pt presented with a 3 month h/o abdominal pain and a 3 week h/o generalized weakness, anorexia, N/V. 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  5/12 CT abdomen showed diffuse hepatic metastases, omental caking with a complex cystic mass in the LLQ consistent with necrotic lesion, pathologic retroperitoneal lumphadenopathy, trace ascites.  5/13 liver biopsy performed   Pt visibly feeling ill at time of RD visit. Pt reports poor appetite for the past 3 weeks, but is unable to provide more information at this time due to feeling so poorly.   Pending GI consult.  Per wt readings, pt with an 8.76% wt loss x 9 months, which is not significant for time frame.   PO Intake: 100% x 1 recorded meal Pt with orders for Ensure Enlive BID.   UOP: 1,056ml x24 hours I/O: +424.65ml since admit  Labs: CBGs 96-84-75 Medications reviewed and include: Novolog  NUTRITION - FOCUSED PHYSICAL EXAM:  Deferred due to pt feeling ill. Pt requested this be attempted at follow-up.   Diet Order:   Diet Order            Diet regular Room service appropriate? Yes; Fluid consistency: Thin  Diet effective now              EDUCATION NEEDS:   Not appropriate for education at this time  Skin:  Skin Assessment: Reviewed RN Assessment  Last BM:  5/12  Height:    Ht Readings from Last 1 Encounters:  10/30/19 5\' 4"  (1.626 m)    Weight:   Wt Readings from Last 4 Encounters:  10/30/19 106.8 kg  06/24/19 105.5 kg  02/01/19 117.3 kg  07/04/18 115.9 kg    BMI:  Body mass index is 40.42 kg/m.  Estimated Nutritional Needs:   Kcal:  1700-1900  Protein:  85-95 grams  Fluid:  >1.7L/d   Larkin Ina, MS, RD, LDN RD pager number and weekend/on-call pager number located in [place].

## 2019-10-31 NOTE — Progress Notes (Addendum)
HEMATOLOGY-ONCOLOGY PROGRESS NOTE  SUBJECTIVE: Reports abdominal pain has improved this morning.  Reports ongoing intermittent nausea but no vomiting.  Reports she is still having difficulty seeing.  She was trying to put a straw and a drink at the time my visit and could not seek to put in however, she was able to see the correct number of fingers that I held up at approximately 6 feet and 12 feet away. She had an ultrasound-guided liver biopsy performed this morning and tolerated the procedure well.  PHYSICAL EXAMINATION:  Vitals:   10/31/19 0850 10/31/19 0925  BP: 103/61 116/68  Pulse: 98 98  Resp: 14 18  Temp:  97.8 F (36.6 C)  SpO2: 92% 95%   Filed Weights   10/29/19 1616 10/30/19 1157 10/30/19 2118  Weight: 223 lb (101.2 kg) 235 lb 7.2 oz (106.8 kg) 235 lb 7.2 oz (106.8 kg)    Intake/Output from previous day: 05/13 0701 - 05/14 0700 In: 1474.8 [P.O.:240; I.V.:1234.8] Out: 1050 [Urine:1050]  GENERAL:alert, no distress and comfortable LUNGS: clear to auscultation and percussion with normal breathing effort HEART: regular rate & rhythm and no murmurs and no lower extremity edema ABDOMEN: Positive bowel sounds, abdomen soft, nontender.  Hepatomegaly noted. NEURO: alert & oriented x 3 with fluent speech  LABORATORY DATA:  I have reviewed the data as listed CMP Latest Ref Rng & Units 10/31/2019 10/30/2019 10/30/2019  Glucose 70 - 99 mg/dL 85 82 -  BUN 8 - 23 mg/dL 19 24(H) -  Creatinine 0.44 - 1.00 mg/dL 0.96 1.26(H) -  Sodium 135 - 145 mmol/L 138 135 -  Potassium 3.5 - 5.1 mmol/L 4.0 3.6 -  Chloride 98 - 111 mmol/L 103 100 -  CO2 22 - 32 mmol/L 23 23 -  Calcium 8.9 - 10.3 mg/dL 10.3 10.3 -  Total Protein 6.5 - 8.1 g/dL 5.4(L) 5.7(L) 5.9(L)  Total Bilirubin 0.3 - 1.2 mg/dL 2.7(H) 2.3(H) 2.2(H)  Alkaline Phos 38 - 126 U/L 924(H) 1,970(H) 1,941(H)  AST 15 - 41 U/L 239(H) 239(H) 234(H)  ALT 0 - 44 U/L 42 43 43    Lab Results  Component Value Date   WBC 12.0 (H)  10/31/2019   HGB 10.5 (L) 10/31/2019   HCT 34.3 (L) 10/31/2019   MCV 95.5 10/31/2019   PLT 337 10/31/2019   NEUTROABS 8.6 (H) 10/31/2019    CT CHEST WO CONTRAST  Result Date: 10/30/2019 CLINICAL DATA:  Metastatic malignancy EXAM: CT CHEST WITHOUT CONTRAST TECHNIQUE: Multidetector CT imaging of the chest was performed following the standard protocol without IV contrast. COMPARISON:  10/29/2019, 06/18/2019 FINDINGS: Cardiovascular: No pericardial effusion. Normal caliber of the thoracic aorta. Minimal atherosclerosis. Mediastinum/Nodes: Numerous enlarged mediastinal lymph nodes are identified. Index lymph node in the right paratracheal region measures 21 x 21 mm, previous having measured 27 x 22 mm. Increased number of enlarged lymph nodes throughout the mediastinum compared to prior neck CT. The trachea, esophagus, and thyroid are unremarkable. Lungs/Pleura: There is a small spiculated sub solid nodule in the right upper lobe image 35, measuring 5 mm. No other pulmonary nodules or masses. Mild upper lobe predominant emphysema. Hypoventilatory changes at the lung bases. No large effusion or pneumothorax. Upper Abdomen: Please refer to recent abdominal CT describing numerous hepatic metastases as well as omental caking and trace ascites. Musculoskeletal: There are no acute or destructive bony lesions. Reconstructed images demonstrate no additional findings. IMPRESSION: 1. Nonspecific sub solid 5 mm right upper lobe pulmonary nodule. Neoplasm cannot be excluded in light  of findings elsewhere within the chest and abdomen. 2. Extensive mediastinal lymphadenopathy worrisome for nodal metastases. 3. Aortic Atherosclerosis (ICD10-I70.0) and Emphysema (ICD10-J43.9). 4. Please refer to recent abdominal CT describing diffuse hepatic metastases. Electronically Signed   By: Randa Ngo M.D.   On: 10/30/2019 21:56   CT ABDOMEN PELVIS W CONTRAST  Result Date: 10/29/2019 CLINICAL DATA:  Hyperkalemia,, liver disease,  emesis, weight loss EXAM: CT ABDOMEN AND PELVIS WITH CONTRAST TECHNIQUE: Multidetector CT imaging of the abdomen and pelvis was performed using the standard protocol following bolus administration of intravenous contrast. CONTRAST:  83mL OMNIPAQUE IOHEXOL 300 MG/ML  SOLN COMPARISON:  06/17/2019, 01/05/2006 FINDINGS: Lower chest: No acute pleural or parenchymal lung disease. Hepatobiliary: There are innumerable masses replacing the majority of the liver parenchyma consistent with diffuse metastatic disease. The gallbladder is surgically absent. Pancreas: Unremarkable. No pancreatic ductal dilatation or surrounding inflammatory changes. Spleen: Normal in size without focal abnormality. Adrenals/Urinary Tract: Adrenal glands are unremarkable. Kidneys are normal, without renal calculi, focal lesion, or hydronephrosis. Bladder is unremarkable. Stomach/Bowel: No bowel obstruction or ileus. No bowel wall thickening or inflammatory changes. Vascular/Lymphatic: There is pathologic adenopathy within the retroperitoneum. Largest pathologic lymph node in the left external iliac chain reference image 77 measures 3.5 x 3.1 cm. A second index lymph node at the right iliac bifurcation measures 2.6 x 2.2 cm reference image 62. There is minimal atherosclerosis of the aorta. Reproductive: Uterus is surgically absent. I do not see any adnexal lesions. Other: There is soft tissue fat stranding throughout the ventral omentum consistent with omental caking. Complex cystic mass within the left lower quadrant along the anterior abdominal wall measuring 5.7 x 7.2 cm is most consistent with a necrotic mass. Mural thickening along the anterior and lateral margin of the mass. There is trace free fluid within the upper abdomen surrounding the liver and spleen. No free intra-abdominal gas. Musculoskeletal: There are no acute or destructive bony lesions. Reconstructed images demonstrate no additional findings. IMPRESSION: 1. Diffuse hepatic  metastases. 2. Omental caking. Complex cystic mass left lower quadrant consistent with necrotic lesion. 3. Pathologic retroperitoneal lymphadenopathy, most pronounced within the iliac distributions left greater than right. 4. Trace ascites. Electronically Signed   By: Randa Ngo M.D.   On: 10/29/2019 22:15   US BIOPSY (LIVER)  Result Date: 10/31/2019 INDICATION: 74 year old female with multifocal hepatic lesions concerning for metastatic disease. She presents for ultrasound-guided biopsy of the same. EXAM: ULTRASOUND BIOPSY CORE LIVER MEDICATIONS: None. ANESTHESIA/SEDATION: Moderate (conscious) sedation was employed during this procedure. A total of Versed 1 mg and Fentanyl 50 mcg was administered intravenously. Moderate Sedation Time: 13 minutes. The patient's level of consciousness and vital signs were monitored continuously by radiology nursing throughout the procedure under my direct supervision. FLUOROSCOPY TIME:  None. COMPLICATIONS: None immediate. PROCEDURE: Informed written consent was obtained from the patient after a thorough discussion of the procedural risks, benefits and alternatives. All questions were addressed. A timeout was performed prior to the initiation of the procedure. The liver was interrogated with ultrasound. There are innumerable hypoechoic solid lesions scattered throughout the liver. A suitable lesion in the left hepatic lobe was identified. The overlying skin was sterilely prepped and draped in the standard fashion. Local anesthesia was attained by infiltration with 1% lidocaine. A small dermatotomy was made. Under real-time ultrasound guidance, multiple 18 gauge core biopsies were obtained using the bio Pince automated biopsy device. Biopsies were performed coaxially through a 17 gauge introducer needle. As the introducer needle was withdrawn, the  biopsy tract was embolized with a Gel-Foam slurry. Post ultrasound imaging demonstrates no evidence of immediate complication.  IMPRESSION: Technically successful ultrasound-guided core biopsy of liver lesion. Electronically Signed   By: Jacqulynn Cadet M.D.   On: 10/31/2019 12:09    ASSESSMENT AND PLAN: 1.  Hepatic metastases, omental caking, and mediastinal/retroperitoneal lymphadenopathy concerning for underlying malignancy 2.  Elevated total bilirubin and LFTs 3.  AKI secondary to dehydration 4.  Hypercalcemia 5.  Deconditioning 6.  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  Diabetes mellitus 8.  Temporal arteritis, status post treatment 9.  Migraine headaches 10.  Leg weakness-steroid myopathy?,  Paraneoplastic syndrome?  Ms Delgaudio appears stable.  Abdominal pain improved today.  CA 27.29 is significantly elevated at 6728.  CEA, CA 19.9, and AFP are currently pending.  Biopsy of one of her liver lesions was performed on 10/31/2019 and we are awaiting pathology results.  CT of the chest without contrast performed 10/30/2019 showed extensive mediastinal lymphadenopathy concerning for nodal metastases.  Alkaline phosphatase trending downward but total bili remains elevated.  Renal function improving.  She reported spinal pain, right arm weakness, and extreme weakness to hospitalist who has ordered an MRI of the brain to evaluate for metastatic disease.  Recommendations: 1.  Await liver biopsy, CEA, CA 19.9, and AFP results. 2.  Left message for Dr. Watt Climes who indicates that he will return on Monday and review her case. 3.  We will follow-up on MRI of the brain. 4.  Add DVT prophylaxis if okay with radiology following the liver biopsy  Please call medical oncology over the weekend for questions.   LOS: 1 day   Mikey Bussing, DNP, AGPCNP-BC, AOCNP 10/31/19 I reviewed the chest CT findings with Ms. Trego.  She underwent a liver biopsy today.  We will follow up on the pathology 11/03/2019.  The elevated CA 27-29 suggest the possibility of metastatic  breast cancer, but the differential diagnosis includes a gastrointestinal malignancy and other primary tumor sites.  The neurologic symptoms may be unrelated to the malignancy, but she could have CNS metastases including carcinomatous meningitis.  Julieanne Manson, MD

## 2019-10-31 NOTE — Progress Notes (Signed)
Ok to change Celexa to 20mg  qday since it's the max recommended dose for patients older 73 yo. Per Dr Norlene Duel, PharmD, BCIDP, AAHIVP, CPP Infectious Disease Pharmacist 10/31/2019 2:04 PM

## 2019-10-31 NOTE — Procedures (Signed)
Interventional Radiology Procedure Note ° °Procedure: US guided core biopsy of liver lesion ° °Complications: None ° °Estimated Blood Loss: None ° °Recommendations: °- Path sent °- Bed rest x 2 hrs ° ° °Signed, ° °Elin Fenley K. Lasha Echeverria, MD ° ° °

## 2019-10-31 NOTE — Progress Notes (Signed)
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 Patient has history of breast cancer that was treated with lumpectomy and radiation more than 20 years ago.  Has history of hysterectomy and bilateral oophorectomy when she was 74 year old for abnormal bleeding. Patient was having some symptoms including anorexia, and abdominal bloating, poor appetite for about 3 months now.  Patient also has more ambulatory problems and has been using walker for last 2 weeks.  She was on very high-dose steroids and attributed some of the symptoms to those medications.  She was weaned off his steroids about 5 weeks ago.  For the last few days she is not able to eat, has nausea and no appetite.  Patient also has progressive increase in size of her abdominal girth.  Pain is all over the abdomen, mostly in the right upper quadrant.  She went to her primary care physician's office, had elevated alkaline phosphatase and LFTs and was sent to ER. In the emergency room, hemodynamically stable.  Clinically dehydrated.  CT scan of the abdomen pelvis showed diffuse hepatic metastatic disease, omental caking, complex cyst on the left lower quadrant, multiple lymphadenopathy.  With clinical dehydration and possible new diagnosis of cancer, patient was admitted to the hospital.   Assessment & Plan:   Principal Problem:   Metastatic neoplastic disease ([place]) Active Problems:   Uncontrolled type 2 diabetes mellitus with hyperglycemia, with long-term current use of insulin (H[facility])   Hypercalcemia   AKI (acute kidney injury) ([place])   Dehydration with hyponatremia   GERD  without esophagitis   Metastatic cancer ([place])  Suspected metastatic disease: Primary source unknown.  Suspect primary GI source. Status post ultrasound-guided liver biopsy today. Very high tumor marker CA 19 Case discussed with interventional radiology, scheduled for ultrasound-guided liver biopsy. CA 27.  29. Symptomatic treatment now. CT scan of the chest consistent with diffuse lymphadenopathy. Patient complained of spinal pain, new onset of right arm weakness and extreme weakness, will check MRI of the brain to look for any brain metastasis. Followed by oncology, further management as per them.  Acute kidney injury: With ongoing poor appetite and intake.  Continue maintenance IV fluids.  Encourage oral intake.  Continue to monitor renal functions.  Some improvement today.  Type 2 diabetes uncontrolled with hyperglycemia: Currently with poor appetite and n.p.o. A1c 4.8.  Keep on sliding scale insulin until normal appetite.  Dehydration with hyponatremia: Treated with isotonic fluid with improvement.  Continue.  GERD without esophagitis: On Protonix at home.  Continue IV.  Unable to take reliable oral intake.  Extreme physical debility and deconditioning: Continue to work with PT OT.  Will probably benefit with rehab.  Also depends upon whether her cancer is treatable or not.  Lactic acidosis: Due to liver function abnormality.  No evidence of active infection.  Morbid obesity:Body mass index is 40.42 kg/m.   DVT prophylaxis: SCDs Code Status: Full code Family Communication: Husband at the bedside 5/13. Disposition Plan: Status is: Inpatient.  The patient will require care spanning > 2 midnights and should be moved to inpatient because: Persistent severe electrolyte disturbances, Ongoing diagnostic testing needed not appropriate for outpatient work up  and Inpatient level of care appropriate due to severity of illness  Dispo: The patient is from: Home              Anticipated  d/c is to: SNF              Anticipated d/c date is: 2 days              Patient currently is not medically stable to d/c.  Patient with extreme physical debility, new diagnosis of cancer and needing inpatient investigations and treatment.         Consultants:   Oncology  Interventional radiology  Procedures:   Ultrasound-guided liver biopsy, 5/14.  Antimicrobials:   None   Subjective: Patient seen and examined.  Very anxious.  She could not get out of the bed with a single person support.  Complains of numbness on her arms.  Abdominal pain is diffuse and persistent.  Denies any nausea vomiting.  Has no appetite. Came back from procedure. Objective: Vitals:   10/31/19 0840 10/31/19 0845 10/31/19 0850 10/31/19 0925  BP: 104/70 106/68 103/61 116/68  Pulse: 97 97 98 98  Resp: 14 13 14 18   Temp:    97.8 F (36.6 C)  TempSrc:    Oral  SpO2: 96% 91% 92% 95%  Weight:      Height:        Intake/Output Summary (Last 24 hours) at 10/31/2019 1210 Last data filed at 10/31/2019 0600 Gross per 24 hour  Intake 1474.8 ml  Output 1050 ml  Net 424.8 ml   Filed Weights   10/29/19 1616 10/30/19 1157 10/30/19 2118  Weight: 101.2 kg 106.8 kg 106.8 kg    Examination:  General exam: Appears calm and comfortable, chronically sick looking.  Anxious. Respiratory system: Clear to auscultation. Respiratory effort normal. Cardiovascular system: S1 & S2 heard, RRR. No JVD, murmurs, rubs, gallops or clicks. No pedal edema. Gastrointestinal system: Abdomen is distended.  Palpable nodular liver.  Obese and pendulous.  No localized tenderness rigidity or guarding. Central nervous system: Alert and oriented. No focal neurological deficits. Extremities: Symmetric 5 x 5 power. Skin: No rashes, lesions or ulcers Psychiatry: Judgement and insight appear normal. Mood & affect anxious.    Data Reviewed: I have personally reviewed following labs and imaging studies  CBC: Recent Labs  Lab  10/29/19 1719 10/30/19 0426 10/31/19 0455  WBC 12.3* 12.5* 12.0*  NEUTROABS  --  10.5* 8.6*  HGB 12.2 11.1* 10.5*  HCT 40.1 35.4* 34.3*  MCV 95.5 92.9 95.5  PLT 436* 420* [id number]   Basic Metabolic Panel: Recent Labs  Lab 10/29/19 1857 10/30/19 0426 10/31/19 0455  NA 133* 135 138  K 3.7 3.6 4.0  CL 94* 100 103  CO2 23 23 23   GLUCOSE 134* 82 85  BUN 29* 24* 19  CREATININE 1.59* 1.26* 0.96  CALCIUM 10.6* 10.3 10.3  MG  --  1.9 1.8  PHOS  --   --  2.8   GFR: Estimated Creatinine Clearance: 63.1 mL/min (by C-G formula based on SCr of 0.96 mg/dL). Liver Function Tests: Recent Labs  Lab 10/29/19 1857 10/30/19 0015 10/30/19 0426 10/31/19 0455  AST 251* 234* 239* 239*  ALT 47* 43 43 42  ALKPHOS 2,021* 1,941* 1,970* 924*  BILITOT 2.3* 2.2* 2.3* 2.7*  PROT 6.2* 5.9* 5.7* 5.4*  ALBUMIN 2.2* 2.0* 2.0* 1.7*   Recent Labs  Lab 10/29/19 1857  LIPASE 33   No results for input(s): AMMONIA in the last  168 hours. Coagulation Profile: Recent Labs  Lab 10/30/19 0015  INR 1.1   Cardiac Enzymes: No results for input(s): CKTOTAL, CKMB, CKMBINDEX, TROPONINI in the last 168 hours. BNP (last 3 results) No results for input(s): PROBNP in the last 8760 hours. HbA1C: Recent Labs    10/29/19 2329  HGBA1C 4.8   CBG: Recent Labs  Lab 10/30/19 2119 10/31/19 0008 10/31/19 0408 10/31/19 0717 10/31/19 1121  GLUCAP 121* 96 84 75 84   Lipid Profile: No results for input(s): CHOL, HDL, LDLCALC, TRIG, CHOLHDL, LDLDIRECT in the last 72 hours. Thyroid Function Tests: Recent Labs    10/29/19 2330  TSH 1.696   Anemia Panel: No results for input(s): VITAMINB12, FOLATE, FERRITIN, TIBC, IRON, RETICCTPCT in the last 72 hours. Sepsis Labs: Recent Labs  Lab 10/30/19 0015 10/30/19 0427  LATICACIDVEN 3.1* 2.6*    Recent Results (from the past 240 hour(s))  SARS Coronavirus 2 by RT PCR (hospital order, performed in [facility] hospital lab) Nasopharyngeal Nasopharyngeal Swab      Status: None   Collection Time: 10/29/19 10:49 PM   Specimen: Nasopharyngeal Swab  Result Value Ref Range Status   SARS Coronavirus 2 NEGATIVE NEGATIVE Final    Comment: (NOTE) SARS-CoV-2 target nucleic acids are NOT DETECTED. The SARS-CoV-2 RNA is generally detectable in upper and lower respiratory specimens during the acute phase of infection. The lowest concentration of SARS-CoV-2 viral copies this assay can detect is 250 copies / mL. A negative result does not preclude SARS-CoV-2 infection and should not be used as the sole basis for treatment or other patient management decisions.  A negative result may occur with improper specimen collection / handling, submission of specimen other than nasopharyngeal swab, presence of viral mutation(s) within the areas targeted by this assay, and inadequate number of viral copies (<250 copies / mL). A negative result must be combined with clinical observations, patient history, and epidemiological information. Fact Sheet for Patients:   StrictlyIdeas.no Fact Sheet for Healthcare Providers: BankingDealers.co.za This test is not yet approved or cleared  by the [place] FDA and has been authorized for detection and/or diagnosis of SARS-CoV-2 by FDA under an Emergency Use Authorization (EUA).  This EUA will remain in effect (meaning this test can be used) for the duration of the COVID-19 declaration under Section 564(b)(1) of the Act, 21 U.S.C. section 360bbb-3(b)(1), unless the authorization is terminated or revoked sooner. Performed at [facility], [place] [address], [place],  [address]          Radiology Studies: CT CHEST WO CONTRAST  Result Date: 10/30/2019 CLINICAL DATA:  Metastatic malignancy EXAM: CT CHEST WITHOUT CONTRAST TECHNIQUE: Multidetector CT imaging of the chest was performed following the standard protocol without IV contrast. COMPARISON:  10/29/2019, 06/18/2019  FINDINGS: Cardiovascular: No pericardial effusion. Normal caliber of the thoracic aorta. Minimal atherosclerosis. Mediastinum/Nodes: Numerous enlarged mediastinal lymph nodes are identified. Index lymph node in the right paratracheal region measures 21 x 21 mm, previous having measured 27 x 22 mm. Increased number of enlarged lymph nodes throughout the mediastinum compared to prior neck CT. The trachea, esophagus, and thyroid are unremarkable. Lungs/Pleura: There is a small spiculated sub solid nodule in the right upper lobe image 35, measuring 5 mm. No other pulmonary nodules or masses. Mild upper lobe predominant emphysema. Hypoventilatory changes at the lung bases. No large effusion or pneumothorax. Upper Abdomen: Please refer to recent abdominal CT describing numerous hepatic metastases as well as omental caking and trace ascites. Musculoskeletal: There  are no acute or destructive bony lesions. Reconstructed images demonstrate no additional findings. IMPRESSION: 1. Nonspecific sub solid 5 mm right upper lobe pulmonary nodule. Neoplasm cannot be excluded in light of findings elsewhere within the chest and abdomen. 2. Extensive mediastinal lymphadenopathy worrisome for nodal metastases. 3. Aortic Atherosclerosis (ICD10-I70.0) and Emphysema (ICD10-J43.9). 4. Please refer to recent abdominal CT describing diffuse hepatic metastases. Electronically Signed   By: Randa Ngo M.D.   On: 10/30/2019 21:56   CT ABDOMEN PELVIS W CONTRAST  Result Date: 10/29/2019 CLINICAL DATA:  Hyperkalemia,, liver disease, emesis, weight loss EXAM: CT ABDOMEN AND PELVIS WITH CONTRAST TECHNIQUE: Multidetector CT imaging of the abdomen and pelvis was performed using the standard protocol following bolus administration of intravenous contrast. CONTRAST:  33mL OMNIPAQUE IOHEXOL 300 MG/ML  SOLN COMPARISON:  06/17/2019, 01/05/2006 FINDINGS: Lower chest: No acute pleural or parenchymal lung disease. Hepatobiliary: There are innumerable  masses replacing the majority of the liver parenchyma consistent with diffuse metastatic disease. The gallbladder is surgically absent. Pancreas: Unremarkable. No pancreatic ductal dilatation or surrounding inflammatory changes. Spleen: Normal in size without focal abnormality. Adrenals/Urinary Tract: Adrenal glands are unremarkable. Kidneys are normal, without renal calculi, focal lesion, or hydronephrosis. Bladder is unremarkable. Stomach/Bowel: No bowel obstruction or ileus. No bowel wall thickening or inflammatory changes. Vascular/Lymphatic: There is pathologic adenopathy within the retroperitoneum. Largest pathologic lymph node in the left external iliac chain reference image 77 measures 3.5 x 3.1 cm. A second index lymph node at the right iliac bifurcation measures 2.6 x 2.2 cm reference image 62. There is minimal atherosclerosis of the aorta. Reproductive: Uterus is surgically absent. I do not see any adnexal lesions. Other: There is soft tissue fat stranding throughout the ventral omentum consistent with omental caking. Complex cystic mass within the left lower quadrant along the anterior abdominal wall measuring 5.7 x 7.2 cm is most consistent with a necrotic mass. Mural thickening along the anterior and lateral margin of the mass. There is trace free fluid within the upper abdomen surrounding the liver and spleen. No free intra-abdominal gas. Musculoskeletal: There are no acute or destructive bony lesions. Reconstructed images demonstrate no additional findings. IMPRESSION: 1. Diffuse hepatic metastases. 2. Omental caking. Complex cystic mass left lower quadrant consistent with necrotic lesion. 3. Pathologic retroperitoneal lymphadenopathy, most pronounced within the iliac distributions left greater than right. 4. Trace ascites. Electronically Signed   By: Randa Ngo M.D.   On: 10/29/2019 22:15        Scheduled Meds: . citalopram  40 mg Oral Daily  . feeding supplement (ENSURE ENLIVE)  237 mL  Oral BID BM  . fentaNYL      . fluticasone  1 spray Each Nare BID  . gelatin adsorbable      . insulin aspart  0-15 Units Subcutaneous TID AC & HS  . lidocaine (PF)      . midazolam      . nystatin   Topical TID  . pantoprazole (PROTONIX) IV  40 mg Intravenous Q24H   Continuous Infusions: . sodium chloride 100 mL/hr at 10/31/19 0600     LOS: 1 day    Time spent: 25 minutes    Barb Merino, MD [facility] Pager [phone]

## 2019-10-31 NOTE — Progress Notes (Signed)
Physical Therapy Treatment Patient Details Name: [name] MRN: [id number] DOB: 1945-08-27 Today's Date: 10/31/2019    History of Present Illness [name] is a 74 y/o female admitted secondary to abdominal distention and pain. 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 [name] likely for liver biopsy on 5/14. PMH includes DM, temporal arteritis, breast cancer, OSA on CPAP, R TKA, and ACDF.     [name] Comments    [name] in bed upon arrival of [name], reports fatigue from procedures today, but willing to participate in [name] session. The session was limited by [name] lethargy, but she was able to transition to sitting EOB with modA, where she continued to need BUE support and minA to maintain sitting upright. The [name] became increasingly fatigued and returned to supine after ~6-7 min sitting EOB. The [name] was unable to scoot laterally in bed, and further transfers/mobility were deferred at this point due to [name] pain and lethargy. The [name] will continue to benefit from skilled [name] to progress activity tolerance, endurance, and stability prior to d/c.     Follow Up Recommendations  SNF;Supervision for mobility/OOB(HHPT if [name] asks to go home)     Equipment Recommendations  Wheelchair cushion (measurements [name]);Wheelchair (measurements [name])    Recommendations for Other Services       Precautions / Restrictions Precautions Precautions: Fall Restrictions Weight Bearing Restrictions: No    Mobility  Bed Mobility Overal bed mobility: Needs Assistance Bed Mobility: Supine to Sit;Sit to Supine;Rolling Rolling: Mod assist   Supine to sit: Mod assist Sit to supine: Min assist   General bed mobility comments: HOB raised, modA to raise trunk from elevated HOB, minA to BLE to reposition in bed. modA to complete pericare and bilateral rolling despite [name] attempts to reach with UE  Transfers Overall transfer level: (deferred at this time due to lethargy, [name] unable to clear hips to scoot in bed)                   Ambulation/Gait                 Stairs             Wheelchair Mobility    Modified Rankin (Stroke Patients Only)       Balance Overall balance assessment: Needs assistance Sitting-balance support: Bilateral upper extremity supported;Feet supported Sitting balance-Leahy Scale: Poor Sitting balance - Comments: Leaning R and posterioly, requiring min-mod assist to sit EOB and maintain balance.  Sat EOB 6 min  Postural control: Posterior lean;Right lateral lean                                  Cognition Arousal/Alertness: Lethargic Behavior During Therapy: Flat affect Overall Cognitive Status: Difficult to assess Area of Impairment: Attention;Following commands;Awareness;Problem solving                   Current Attention Level: Sustained   Following Commands: Follows one step commands inconsistently;Follows one step commands with increased time   Awareness: Emergent Problem Solving: Slow processing;Decreased initiation;Difficulty sequencing;Requires tactile cues General Comments: [name] lethargic with minimal eye opening through session, reports it is due to pain (headache).  [name] was able to follow some commands when sitting EOB but remained significantly distracted by pain and needed frequent cues      Exercises General Exercises - Lower Extremity Heel Slides: AROM;Both;5 reps;Supine Other Exercises Other Exercises: glute bridge, supine, x5  General Comments General comments (skin integrity, edema, etc.): VSS      Pertinent Vitals/Pain Pain Assessment: Faces Faces Pain Scale: Hurts even more Pain Location: headache and abdomen, especially with upright position and rolling in bed Pain Descriptors / Indicators: Discomfort;Headache;Grimacing;Sore Pain Intervention(s): Limited activity within patient's tolerance;Repositioned;Patient requesting pain meds-RN notified    Home Living                      Prior  Function            [name] Goals (current goals can now be found in the care plan section) Acute Rehab [name] Goals Patient Stated Goal: to go home [name] Goal Formulation: With patient Time For Goal Achievement: 11/13/19 Potential to Achieve Goals: Fair Progress towards [name] goals: Not progressing toward goals - comment(not progressing today due to lethargy)    Frequency    Min 3X/week      [name] Plan Current plan remains appropriate    Co-evaluation              AM-PAC [name] "6 Clicks" Mobility   Outcome Measure  Help needed turning from your back to your side while in a flat bed without using bedrails?: A Little Help needed moving from lying on your back to sitting on the side of a flat bed without using bedrails?: A Little Help needed moving to and from a bed to a chair (including a wheelchair)?: A Lot Help needed standing up from a chair using your arms (e.g., wheelchair or bedside chair)?: A Lot Help needed to walk in hospital room?: A Lot Help needed climbing 3-5 steps with a railing? : A Lot 6 Click Score: 14    End of Session   Activity Tolerance: Patient limited by fatigue Patient left: in bed;with call bell/phone within reach;with bed alarm set Nurse Communication: Mobility status [name] Visit Diagnosis: Muscle weakness (generalized) (M62.81);Difficulty in walking, not elsewhere classified (R26.2);Unsteadiness on feet (R26.81)     Time: [id number] [name] Time Calculation (min) (ACUTE ONLY): 24 min  Charges:  $Therapeutic Exercise: 8-22 mins $Therapeutic Activity: 8-22 mins                     [name], [name], [name]   Acute Rehabilitation Department Pager #: [phone]   [name] 10/31/2019, 4:21 PM

## 2019-10-31 NOTE — Progress Notes (Signed)
Occupational Therapy Evaluation Patient Details Name: [name] MRN: [id number] DOB: 09/08/1945 Today's Date: 10/31/2019    History of Present Illness Pt is a 74 y/o female admitted secondary to abdominal distention and pain. 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 Pt likely for liver biopsy on 5/14. PMH includes DM, temporal arteritis, breast cancer, OSA on CPAP, R TKA, and ACDF.    Clinical Impression   Patient lives with husband in a home and requires assistance with ADLs.  She recently has been sponge bathing and staying mostly in bed, using BSC.  Today patient needed mod assist to get to EOB and min/mod to maintain seated balance while grooming.  Patient has poor UE strength, with R UE weaker than L.  Patient reporting new R hand numbness and double vision, causing difficulty with fine motor activity.  Vision difficult to fully assess as patient was not following simple commands.  She became increasingly more lethargic as session went on.  MD made aware of cognitive status and new numbness.  Will continue to follow with OT acutely to address the deficits listed below.       Follow Up Recommendations  SNF    Equipment Recommendations  Other (comment)(defer to next venue)    Recommendations for Other Services       Precautions / Restrictions Precautions Precautions: Fall Restrictions Weight Bearing Restrictions: No      Mobility Bed Mobility Overal bed mobility: Needs Assistance Bed Mobility: Supine to Sit;Sit to Supine     Supine to sit: Mod assist Sit to supine: Min assist   General bed mobility comments: HOB raised  Transfers                 General transfer comment: unable    Balance Overall balance assessment: Needs assistance Sitting-balance support: Bilateral upper extremity supported;Feet supported Sitting balance-Leahy Scale: Poor Sitting balance - Comments: Leaning R and posterioly, requiring min-mod assist to  sit EOB and maintain balance.  Sat EOB 6 min  Postural control: Posterior lean;Right lateral lean                                 ADL either performed or assessed with clinical judgement   ADL Overall ADL's : Needs assistance/impaired Eating/Feeding: Set up;Sitting   Grooming: Oral care;Set up;Sitting   Upper Body Bathing: Sitting;Moderate assistance   Lower Body Bathing: Maximal assistance;Sitting/lateral leans   Upper Body Dressing : Sitting;Moderate assistance   Lower Body Dressing: Maximal assistance;Sitting/lateral leans   Toilet Transfer: Maximal assistance;+2 for physical assistance           Functional mobility during ADLs: Moderate assistance(with bed mobility, unable to attempt stand) General ADL Comments: Stayed EOB for safety due to patient's weakness and lethargy     Vision Baseline Vision/History: Wears glasses Patient Visual Report: Diplopia Vision Assessment?: Vision impaired- to be further tested in functional context Additional Comments: Patient had trouble with tracking, convergence, ad depth perception, though difficult to tell if she was having trouble following instruction or was unable to complete tasks.  Reports diplopia with near and far.       Perception     Praxis      Pertinent Vitals/Pain Pain Assessment: Faces Faces Pain Scale: Hurts a little bit Pain Location: general discomfort Pain Descriptors / Indicators: Discomfort Pain Intervention(s): Limited activity within patient's tolerance     Hand Dominance Right   Extremity/Trunk Assessment  Upper Extremity Assessment Upper Extremity Assessment: RUE deficits/detail;LUE deficits/detail RUE Deficits / Details: Strength 2/5, she states it is normally weaker as of 3 months ago.  Reporting new R hand numbness causing difficulty with fine motor skills.. RUE Sensation: decreased light touch RUE Coordination: decreased fine motor;decreased gross motor LUE Deficits / Details:  STrength 3/5, overall weakness LUE Coordination: decreased gross motor   Lower Extremity Assessment Lower Extremity Assessment: Defer to PT evaluation       Communication Communication Communication: No difficulties   Cognition Arousal/Alertness: Lethargic Behavior During Therapy: Flat affect Overall Cognitive Status: Difficult to assess Area of Impairment: Attention;Following commands;Awareness;Problem solving                   Current Attention Level: Sustained   Following Commands: Follows one step commands inconsistently;Follows one step commands with increased time   Awareness: Emergent Problem Solving: Slow processing;Decreased initiation;Difficulty sequencing;Requires tactile cues General Comments: Patient seemed to get progressively lethargic during session.  She was slow to follow commands and required multiple repititions of instruction.  She had poor attention and appeared to drift off while speaking to her.   General Comments  VS normal    Exercises     Shoulder Instructions      Home Living Family/patient expects to be discharged to:: Private residence Living Arrangements: Spouse/significant other Available Help at Discharge: Family;Available PRN/intermittently Type of Home: House Home Access: Stairs to enter [employer] of Steps: 1 Entrance Stairs-Rails: None Home Layout: One level     Bathroom Shower/Tub: Occupational psychologist: Standard     Home Equipment: Cane - single point;Walker - 4 wheels;Hand held shower head;Shower seat;Bedside commode          Prior Functioning/Environment Level of Independence: Needs assistance  Gait / Transfers Assistance Needed: Has been staying in the bed most days and transfers to [facility]. Had fall going up step. Has not been ambulating.  ADL's / Homemaking Assistance Needed: Has required assist with sponge bathing. Uses BSC for toileting.    Comments: uses rollator at baseline, normally  wears a brief for incontinence.         OT Problem List: Decreased strength;Decreased range of motion;Decreased activity tolerance      OT Treatment/Interventions:      OT Goals(Current goals can be found in the care plan section) Acute Rehab OT Goals Patient Stated Goal: to go home OT Goal Formulation: With patient Time For Goal Achievement: 11/14/19 Potential to Achieve Goals: Good  OT Frequency: Min 2X/week   Barriers to D/C:            Co-evaluation              AM-PAC OT "6 Clicks" Daily Activity     Outcome Measure Help from another person eating meals?: A Little Help from another person taking care of personal grooming?: A Little Help from another person toileting, which includes using toliet, bedpan, or urinal?: A Lot Help from another person bathing (including washing, rinsing, drying)?: A Lot Help from another person to put on and taking off regular upper body clothing?: A Lot Help from another person to put on and taking off regular lower body clothing?: A Lot 6 Click Score: 14   End of Session Nurse Communication: Mobility status  Activity Tolerance: Patient limited by lethargy Patient left: in bed;with call bell/phone within reach;with bed alarm set  OT Visit Diagnosis: Unsteadiness on feet (R26.81);Muscle weakness (generalized) (M62.81);Other symptoms and signs involving cognitive function  Time: [id number] OT Time Calculation (min): 28 min Charges:  OT General Charges $OT Visit: 1 Visit OT Evaluation $OT Eval Moderate Complexity: 1 Mod OT Treatments $Self Care/Home Management : 8-22 mins  August Luz, OTR/L   Phylliss Bob 10/31/2019, 10:09 AM

## 2019-11-01 LAB — COMPREHENSIVE METABOLIC PANEL
ALT: 45 U/L — ABNORMAL HIGH (ref 0–44)
AST: 249 U/L — ABNORMAL HIGH (ref 15–41)
Albumin: 1.8 g/dL — ABNORMAL LOW (ref 3.5–5.0)
Alkaline Phosphatase: 1484 U/L — ABNORMAL HIGH (ref 38–126)
Anion gap: 11 (ref 5–15)
BUN: 19 mg/dL (ref 8–23)
CO2: 21 mmol/L — ABNORMAL LOW (ref 22–32)
Calcium: 10.6 mg/dL — ABNORMAL HIGH (ref 8.9–10.3)
Chloride: 107 mmol/L (ref 98–111)
Creatinine, Ser: 1.03 mg/dL — ABNORMAL HIGH (ref 0.44–1.00)
GFR calc Af Amer: >60 mL/min (ref >60)
GFR calc non Af Amer: 54 mL/min — ABNORMAL LOW (ref >60)
Glucose, Bld: 87 mg/dL (ref 70–99)
Potassium: 4.2 mmol/L (ref 3.5–5.1)
Sodium: 139 mmol/L (ref 135–145)
Total Bilirubin: 3.9 mg/dL — ABNORMAL HIGH (ref 0.3–1.2)
Total Protein: 5.6 g/dL — ABNORMAL LOW (ref 6.5–8.1)

## 2019-11-01 LAB — AMMONIA: Ammonia: 61 umol/L — ABNORMAL HIGH (ref 9–35)

## 2019-11-01 LAB — BLOOD GAS, ARTERIAL
Acid-base deficit: 0.5 mmol/L (ref 0.0–2.0)
Bicarbonate: 23.3 mmol/L (ref 20.0–28.0)
Drawn by: 28338
FIO2: 21
O2 Saturation: 94.6 %
Patient temperature: 36.7
pCO2 arterial: 36 mmHg (ref 32.0–48.0)
pH, Arterial: 7.426 (ref 7.350–7.450)
pO2, Arterial: 71.4 mmHg — ABNORMAL LOW (ref 83.0–108.0)

## 2019-11-01 LAB — GLUCOSE, CAPILLARY
Glucose-Capillary: 82 mg/dL (ref 70–99)
Glucose-Capillary: 93 mg/dL (ref 70–99)
Glucose-Capillary: 111 mg/dL — ABNORMAL HIGH (ref 70–99)
Glucose-Capillary: 110 mg/dL — ABNORMAL HIGH (ref 70–99)

## 2019-11-01 LAB — HEMOGLOBIN AND HEMATOCRIT, BLOOD
HCT: 34.3 % — ABNORMAL LOW (ref 36.0–46.0)
Hemoglobin: 10.6 g/dL — ABNORMAL LOW (ref 12.0–15.0)

## 2019-11-01 MED ORDER — LACTULOSE 10 GM/15ML PO SOLN
10.0000 g | Freq: Three times a day (TID) | ORAL | Status: DC
  Administered 2019-11-01 (×3): 10 g via ORAL
  Filled 2019-11-01 (×3): qty 15

## 2019-11-01 MED ORDER — FLUCONAZOLE 150 MG PO TABS
150.0000 mg | ORAL_TABLET | Freq: Once | ORAL | Status: AC
  Administered 2019-11-01: 150 mg via ORAL
  Filled 2019-11-01: qty 1

## 2019-11-01 MED ORDER — NYSTATIN 100000 UNIT/ML MT SUSP
5.0000 mL | Freq: Four times a day (QID) | OROMUCOSAL | Status: DC
  Administered 2019-11-01 – 2019-11-19 (×58): 500000 [IU] via OROMUCOSAL
  Filled 2019-11-01 (×61): qty 5

## 2019-11-01 NOTE — Progress Notes (Signed)
PROGRESS NOTE    [name]  [id number] DOB: 1946/05/22 DOA: 10/29/2019 PCP: Leonides Sake, MD    Brief Narrative:  Patient is 74 year old female with history of type 2 diabetes on insulin, temporal arteritis recently treated with very high dose of steroids, GERD, migraine headaches, obstructive sleep apnea who presents to the emergency room with abnormal lab from her primary care physician's office. Recently getting extremely debilitated and unable to walk since last 2 weeks. In the emergency room, hemodynamically stable.  Clinically dehydrated.  CT scan abdomen pelvis shows extensive hepatic metastatic disease, omental caking and complex cyst left lower quadrant, multiple lymphadenopathy.   Assessment & Plan:   Principal Problem:   Metastatic neoplastic disease ([place]) Active Problems:   Uncontrolled type 2 diabetes mellitus with hyperglycemia, with long-term current use of insulin (HCC)   Hypercalcemia   AKI (acute kidney injury) ([place])   Dehydration with hyponatremia   GERD without esophagitis   Metastatic cancer ([place])  Suspected metastatic disease: Primary source unknown.  Suspect primary GI source. Status post ultrasound-guided liver biopsy 5/14.  Results pending.  Symptomatic treatment now. CT scan of the chest consistent with diffuse lymphadenopathy. MRI of the brain with no evidence of meningeal disease, parenchymal metastasis. She does have severe comorbidities, now with advanced debility. Patient also developing confusion, ammonia 61.  No evidence of fulminant hepatic failure, however will start patient on lactulose.  There was no ascites on examination. Decrease dose of Celexa to 20 mg daily, adjust for age. Her cancer diagnosis is pending, however patient has severe comorbidities and now with advanced physical debility.  Unsure whether patient will be a treatment candidate for cancer.  I will defer this discussion to her oncologist. We will consult palliative  care medicine to coordinate and start counseling. GI to see patient on 5/17.  Acute kidney injury: With ongoing poor appetite and intake.  Continue maintenance IV fluids.  Decrease dose today.  Encourage oral intake.  Continue to monitor renal functions.  Some improvement today.  Urine output is adequate.  Type 2 diabetes uncontrolled with hyperglycemia: Currently with poor appetite and n.p.o. A1c 4.8.  Keep on sliding scale insulin until normal appetite.  Dehydration with hyponatremia: Treated with isotonic fluid with improvement.  Continue.  Decrease dose.  GERD without esophagitis: On Protonix at home.  Continue IV.  Unable to take reliable oral intake.  Extreme physical debility and deconditioning: Continue to work with PT OT.  Will probably benefit with rehab.  Also depends upon whether her cancer is treatable or not.  Lactic acidosis: Due to liver function abnormality.  No evidence of active infection.  Lethargy/somnolence: Probably due to ongoing medical issues.  Will check blood gas.  Ammonia 61, started on lactulose.  She does have history of sleep apnea, will use CPAP.  Morbid obesity:Body mass index is 40.83 kg/m.   DVT prophylaxis: SCDs Code Status: Full code Family Communication: Husband at the bedside Disposition Plan: Status is: Inpatient.  The patient will require care spanning > 2 midnights and should be moved to inpatient because: Persistent severe electrolyte disturbances, Ongoing diagnostic testing needed not appropriate for outpatient work up and Inpatient level of care appropriate due to severity of illness  Dispo: The patient is from: Home              Anticipated d/c is to: SNF              Anticipated d/c date is: 2 days  Patient currently is not medically stable to d/c.  Patient with extreme physical debility, new diagnosis of cancer and needing inpatient investigations and treatment.     Consultants:   Oncology  Interventional  radiology  Procedures:   Ultrasound-guided liver biopsy, 5/14.  Antimicrobials:   None   Subjective: Seen and examined.  More lethargic.  Complains of abdominal pain.  No appetite.  She was confabulating today.  Objective: Vitals:   10/31/19 0925 10/31/19 2142 11/01/19 0403 11/01/19 0957  BP: 116/68 (!) 101/48 105/85 119/73  Pulse: 98 (!) 108 80 (!) 103  Resp: 18 16 16 18   Temp: 97.8 F (36.6 C) 98.7 F (37.1 C) 98.2 F (36.8 C)   TempSrc: Oral Oral Oral   SpO2: 95% 94% 92% 93%  Weight:  107.9 kg    Height:        Intake/Output Summary (Last 24 hours) at 11/01/2019 1112 Last data filed at 11/01/2019 0630 Gross per 24 hour  Intake 1806.63 ml  Output 900 ml  Net 906.63 ml   Filed Weights   10/30/19 1157 10/30/19 2118 10/31/19 2142  Weight: 106.8 kg 106.8 kg 107.9 kg    Examination:  General exam: Appears calm and comfortable, chronically sick looking.  Anxious. Respiratory system: Clear to auscultation. Respiratory effort normal. Cardiovascular system: S1 & S2 heard, RRR. No JVD, murmurs, rubs, gallops or clicks. No pedal edema. Gastrointestinal system: Abdomen is distended.  Palpable nodular liver.  Obese and pendulous.  No localized tenderness rigidity or guarding. Central nervous system: Alert and oriented. No focal neurological deficits. Patient was confabulating and repeating same sentences over and over again. Extremities: Symmetric 5 x 5 power. Skin: No rashes, lesions or ulcers Psychiatry: Judgement and insight appear normal. Mood & affect anxious.    Data Reviewed: I have personally reviewed following labs and imaging studies  CBC: Recent Labs  Lab 10/29/19 1719 10/30/19 0426 10/31/19 0455 11/01/19 0922  WBC 12.3* 12.5* 12.0*  --   NEUTROABS  --  10.5* 8.6*  --   HGB 12.2 11.1* 10.5* 10.6*  HCT 40.1 35.4* 34.3* 34.3*  MCV 95.5 92.9 95.5  --   PLT 436* 420* 337  --    Basic Metabolic Panel: Recent Labs  Lab 10/29/19 1857 10/30/19 0426  10/31/19 0455 11/01/19 0424  NA 133* 135 138 139  K 3.7 3.6 4.0 4.2  CL 94* 100 103 107  CO2 23 23 23  21*  GLUCOSE 134* 82 85 87  BUN 29* 24* 19 19  CREATININE 1.59* 1.26* 0.96 1.03*  CALCIUM 10.6* 10.3 10.3 10.6*  MG  --  1.9 1.8  --   PHOS  --   --  2.8  --    GFR: Estimated Creatinine Clearance: 59.2 mL/min (A) (by C-G formula based on SCr of 1.03 mg/dL (H)). Liver Function Tests: Recent Labs  Lab 10/29/19 1857 10/30/19 0015 10/30/19 0426 10/31/19 0455 11/01/19 0424  AST 251* 234* 239* 239* 249*  ALT 47* 43 43 42 45*  ALKPHOS 2,021* 1,941* 1,970* 924* 1,484*  BILITOT 2.3* 2.2* 2.3* 2.7* 3.9*  PROT 6.2* 5.9* 5.7* 5.4* 5.6*  ALBUMIN 2.2* 2.0* 2.0* 1.7* 1.8*   Recent Labs  Lab 10/29/19 1857  LIPASE 33   Recent Labs  Lab 11/01/19 0922  AMMONIA 61*   Coagulation Profile: Recent Labs  Lab 10/30/19 0015  INR 1.1   Cardiac Enzymes: No results for input(s): CKTOTAL, CKMB, CKMBINDEX, TROPONINI in the last 168 hours. BNP (last 3 results) No results  for input(s): PROBNP in the last 8760 hours. HbA1C: Recent Labs    10/29/19 2329  HGBA1C 4.8   CBG: Recent Labs  Lab 10/31/19 0717 10/31/19 1121 10/31/19 1623 10/31/19 2143 11/01/19 0631  GLUCAP 75 84 114* 88 82   Lipid Profile: No results for input(s): CHOL, HDL, LDLCALC, TRIG, CHOLHDL, LDLDIRECT in the last 72 hours. Thyroid Function Tests: Recent Labs    10/29/19 2330  TSH 1.696   Anemia Panel: No results for input(s): VITAMINB12, FOLATE, FERRITIN, TIBC, IRON, RETICCTPCT in the last 72 hours. Sepsis Labs: Recent Labs  Lab 10/30/19 0015 10/30/19 0427  LATICACIDVEN 3.1* 2.6*    Recent Results (from the past 240 hour(s))  SARS Coronavirus 2 by RT PCR (hospital order, performed in [facility] hospital lab) Nasopharyngeal Nasopharyngeal Swab     Status: None   Collection Time: 10/29/19 10:49 PM   Specimen: Nasopharyngeal Swab  Result Value Ref Range Status   SARS Coronavirus 2 NEGATIVE  NEGATIVE Final    Comment: (NOTE) SARS-CoV-2 target nucleic acids are NOT DETECTED. The SARS-CoV-2 RNA is generally detectable in upper and lower respiratory specimens during the acute phase of infection. The lowest concentration of SARS-CoV-2 viral copies this assay can detect is 250 copies / mL. A negative result does not preclude SARS-CoV-2 infection and should not be used as the sole basis for treatment or other patient management decisions.  A negative result may occur with improper specimen collection / handling, submission of specimen other than nasopharyngeal swab, presence of viral mutation(s) within the areas targeted by this assay, and inadequate number of viral copies (<250 copies / mL). A negative result must be combined with clinical observations, patient history, and epidemiological information. Fact Sheet for Patients:   StrictlyIdeas.no Fact Sheet for Healthcare Providers: BankingDealers.co.za This test is not yet approved or cleared  by the [place] FDA and has been authorized for detection and/or diagnosis of SARS-CoV-2 by FDA under an Emergency Use Authorization (EUA).  This EUA will remain in effect (meaning this test can be used) for the duration of the COVID-19 declaration under Section 564(b)(1) of the Act, 21 U.S.C. section 360bbb-3(b)(1), unless the authorization is terminated or revoked sooner. Performed at [facility], [place] [address], [place], [place] [address]          Radiology Studies: CT CHEST WO CONTRAST  Result Date: 10/30/2019 CLINICAL DATA:  Metastatic malignancy EXAM: CT CHEST WITHOUT CONTRAST TECHNIQUE: Multidetector CT imaging of the chest was performed following the standard protocol without IV contrast. COMPARISON:  10/29/2019, 06/18/2019 FINDINGS: Cardiovascular: No pericardial effusion. Normal caliber of the thoracic aorta. Minimal atherosclerosis. Mediastinum/Nodes: Numerous  enlarged mediastinal lymph nodes are identified. Index lymph node in the right paratracheal region measures 21 x 21 mm, previous having measured 27 x 22 mm. Increased number of enlarged lymph nodes throughout the mediastinum compared to prior neck CT. The trachea, esophagus, and thyroid are unremarkable. Lungs/Pleura: There is a small spiculated sub solid nodule in the right upper lobe image 35, measuring 5 mm. No other pulmonary nodules or masses. Mild upper lobe predominant emphysema. Hypoventilatory changes at the lung bases. No large effusion or pneumothorax. Upper Abdomen: Please refer to recent abdominal CT describing numerous hepatic metastases as well as omental caking and trace ascites. Musculoskeletal: There are no acute or destructive bony lesions. Reconstructed images demonstrate no additional findings. IMPRESSION: 1. Nonspecific sub solid 5 mm right upper lobe pulmonary nodule. Neoplasm cannot be excluded in light of findings elsewhere within the  chest and abdomen. 2. Extensive mediastinal lymphadenopathy worrisome for nodal metastases. 3. Aortic Atherosclerosis (ICD10-I70.0) and Emphysema (ICD10-J43.9). 4. Please refer to recent abdominal CT describing diffuse hepatic metastases. Electronically Signed   By: Randa Ngo M.D.   On: 10/30/2019 21:56   MR BRAIN W WO CONTRAST  Result Date: 10/31/2019 CLINICAL DATA:  Weakness, metastases EXAM: MRI HEAD WITHOUT AND WITH CONTRAST TECHNIQUE: Multiplanar, multiecho pulse sequences of the brain and surrounding structures were obtained without and with intravenous contrast. CONTRAST:  21mL GADAVIST GADOBUTROL 1 MMOL/ML IV SOLN COMPARISON:  09/30/2018 FINDINGS: Motion artifact is present Brain: There is no acute infarction or intracranial hemorrhage. There is no intracranial mass, mass effect, or edema. There is no hydrocephalus or extra-axial fluid collection. Ventricles and sulci are stable in size and configuration. Patchy T2 hyperintensity in the  supratentorial and pontine white matter is nonspecific but probably reflects mild chronic microvascular ischemic changes. No abnormal enhancement. Vascular: Major vessel flow voids at the skull base are preserved. Skull and upper cervical spine: Normal marrow signal is preserved. There is susceptibility artifact related to partially imaged cervical spine fusion. Sinuses/Orbits: Paranasal sinuses are aerated. Orbits are unremarkable. Other: Sella is unremarkable.  Mastoid air cells are clear. IMPRESSION: Suboptimal evaluation due to motion artifact. No evidence of intracranial metastatic disease. No acute infarction or hemorrhage. Electronically Signed   By: Macy Mis M.D.   On: 10/31/2019 14:24   US BIOPSY (LIVER)  Result Date: 10/31/2019 INDICATION: 74 year old female with multifocal hepatic lesions concerning for metastatic disease. She presents for ultrasound-guided biopsy of the same. EXAM: ULTRASOUND BIOPSY CORE LIVER MEDICATIONS: None. ANESTHESIA/SEDATION: Moderate (conscious) sedation was employed during this procedure. A total of Versed 1 mg and Fentanyl 50 mcg was administered intravenously. Moderate Sedation Time: 13 minutes. The patient's level of consciousness and vital signs were monitored continuously by radiology nursing throughout the procedure under my direct supervision. FLUOROSCOPY TIME:  None. COMPLICATIONS: None immediate. PROCEDURE: Informed written consent was obtained from the patient after a thorough discussion of the procedural risks, benefits and alternatives. All questions were addressed. A timeout was performed prior to the initiation of the procedure. The liver was interrogated with ultrasound. There are innumerable hypoechoic solid lesions scattered throughout the liver. A suitable lesion in the left hepatic lobe was identified. The overlying skin was sterilely prepped and draped in the standard fashion. Local anesthesia was attained by infiltration with 1% lidocaine. A small  dermatotomy was made. Under real-time ultrasound guidance, multiple 18 gauge core biopsies were obtained using the bio Pince automated biopsy device. Biopsies were performed coaxially through a 17 gauge introducer needle. As the introducer needle was withdrawn, the biopsy tract was embolized with a Gel-Foam slurry. Post ultrasound imaging demonstrates no evidence of immediate complication. IMPRESSION: Technically successful ultrasound-guided core biopsy of liver lesion. Electronically Signed   By: Jacqulynn Cadet M.D.   On: 10/31/2019 12:09        Scheduled Meds: . citalopram  20 mg Oral Daily  . feeding supplement (ENSURE ENLIVE)  237 mL Oral TID BM  . fluticasone  1 spray Each Nare BID  . insulin aspart  0-15 Units Subcutaneous TID AC & HS  . lactulose  10 g Oral TID  . nystatin   Topical TID  . pantoprazole (PROTONIX) IV  40 mg Intravenous Q24H   Continuous Infusions: . sodium chloride 100 mL/hr at 11/01/19 0630     LOS: 2 days    Time spent: 25 minutes    Barb Merino,  MD [facility] Pager [phone]

## 2019-11-01 NOTE — Social Work (Signed)
CSW met with patient bedside to complete assessment. Patient had her eyes closed and was drinking milk from a straw. RN was also present in the room. CSW called patient's name multiple times with no response. Patient did not open her eyes and continued to drink her milk. CSW will follow-up.  [employer], LCSWA

## 2019-11-01 NOTE — Progress Notes (Signed)
Pt placed self on cpap at previous settings. Will monotor

## 2019-11-01 NOTE — Progress Notes (Signed)
Patient placed on Auto titrate with max of 15 and min of 5 per MD order. Patient tolerating well at this time, RT will continue to monitor.

## 2019-11-02 DIAGNOSIS — Z7189 Other specified counseling: Secondary | ICD-10-CM

## 2019-11-02 DIAGNOSIS — Z66 Do not resuscitate: Secondary | ICD-10-CM

## 2019-11-02 DIAGNOSIS — Z515 Encounter for palliative care: Secondary | ICD-10-CM

## 2019-11-02 LAB — COMPREHENSIVE METABOLIC PANEL WITH GFR
ALT: 42 U/L (ref 0–44)
AST: 226 U/L — ABNORMAL HIGH (ref 15–41)
Albumin: 1.7 g/dL — ABNORMAL LOW (ref 3.5–5.0)
Alkaline Phosphatase: 1583 U/L — ABNORMAL HIGH (ref 38–126)
Anion gap: 10 (ref 5–15)
BUN: 17 mg/dL (ref 8–23)
CO2: 22 mmol/L (ref 22–32)
Calcium: 11 mg/dL — ABNORMAL HIGH (ref 8.9–10.3)
Chloride: 108 mmol/L (ref 98–111)
Creatinine, Ser: 0.88 mg/dL (ref 0.44–1.00)
GFR calc Af Amer: >60 mL/min
GFR calc non Af Amer: >60 mL/min
Glucose, Bld: 106 mg/dL — ABNORMAL HIGH (ref 70–99)
Potassium: 4.1 mmol/L (ref 3.5–5.1)
Sodium: 140 mmol/L (ref 135–145)
Total Bilirubin: 2.5 mg/dL — ABNORMAL HIGH (ref 0.3–1.2)
Total Protein: 5.3 g/dL — ABNORMAL LOW (ref 6.5–8.1)

## 2019-11-02 LAB — GLUCOSE, CAPILLARY
Glucose-Capillary: 81 mg/dL (ref 70–99)
Glucose-Capillary: 138 mg/dL — ABNORMAL HIGH (ref 70–99)
Glucose-Capillary: 115 mg/dL — ABNORMAL HIGH (ref 70–99)
Glucose-Capillary: 93 mg/dL (ref 70–99)

## 2019-11-02 LAB — CBC WITH DIFFERENTIAL/PLATELET
Abs Immature Granulocytes: 0.35 10*3/uL — ABNORMAL HIGH (ref 0.00–0.07)
Basophils Absolute: 0.1 10*3/uL (ref 0.0–0.1)
Basophils Relative: 1 %
Eosinophils Absolute: 0.3 10*3/uL (ref 0.0–0.5)
Eosinophils Relative: 2 %
HCT: 35.4 % — ABNORMAL LOW (ref 36.0–46.0)
Hemoglobin: 10.8 g/dL — ABNORMAL LOW (ref 12.0–15.0)
Immature Granulocytes: 3 %
Lymphocytes Relative: 10 %
Lymphs Abs: 1.4 10*3/uL (ref 0.7–4.0)
MCH: 30.1 pg (ref 26.0–34.0)
MCHC: 30.5 g/dL (ref 30.0–36.0)
MCV: 98.6 fL (ref 80.0–100.0)
Monocytes Absolute: 1.6 10*3/uL — ABNORMAL HIGH (ref 0.1–1.0)
Monocytes Relative: 12 %
Neutro Abs: 10.1 10*3/uL — ABNORMAL HIGH (ref 1.7–7.7)
Neutrophils Relative %: 72 %
Platelets: 318 10*3/uL (ref 150–400)
RBC: 3.59 MIL/uL — ABNORMAL LOW (ref 3.87–5.11)
RDW: 25 % — ABNORMAL HIGH (ref 11.5–15.5)
WBC: 13.9 10*3/uL — ABNORMAL HIGH (ref 4.0–10.5)
nRBC: 0.4 % — ABNORMAL HIGH (ref 0.0–0.2)

## 2019-11-02 LAB — PROTIME-INR
INR: 1.1 (ref 0.8–1.2)
Prothrombin Time: 13.6 s (ref 11.4–15.2)

## 2019-11-02 LAB — AMMONIA: Ammonia: 76 umol/L — ABNORMAL HIGH (ref 9–35)

## 2019-11-02 MED ORDER — LACTULOSE 10 GM/15ML PO SOLN
20.0000 g | Freq: Three times a day (TID) | ORAL | Status: DC
  Administered 2019-11-02 – 2019-11-03 (×4): 20 g via ORAL
  Filled 2019-11-02 (×4): qty 30

## 2019-11-02 MED ORDER — PANTOPRAZOLE SODIUM 40 MG PO TBEC
40.0000 mg | DELAYED_RELEASE_TABLET | Freq: Every day | ORAL | Status: DC
  Administered 2019-11-02 – 2019-11-18 (×16): 40 mg via ORAL
  Filled 2019-11-02 (×17): qty 1

## 2019-11-02 NOTE — Plan of Care (Signed)
  Problem: Coping: Goal: Level of anxiety will decrease Outcome: Adequate for Discharge   

## 2019-11-02 NOTE — Progress Notes (Signed)
Placed patient on CPAP for the night via auto-mode.  

## 2019-11-02 NOTE — TOC Initial Note (Addendum)
Transition of Care [facility]) - Initial/Assessment Note    Patient Details  Name: [name] MRN: [id number] Date of Birth: Jul 31, 1945  Transition of Care [facility]) CM/SW Contact:    Jacquelynn Cree Phone Number: 11/02/2019, 3:40 PM  Clinical Narrative:                 CSW received consult for possible SNF placement at time of discharge.  CSW met with patient, daughter Jinny Blossom and spouse Eulas Post bedside. Patient provided verbal agreement to discuss information with her daughter & husband.  Patient and family is in agreement with SNF placement at time of discharge and provided permission for the referral to be faxed out. There is a planned meeting Monday to further discuss medical needs. Spouse expressed patient has previously been to [facility], patient expressed that is also her SNF preference. CSW agreed to fax to [facility] and informed family additional SNFs referrals may need to be faxed.  Family expressed concern with patient having challenges eating the salad provided and being able to better eat pudding and drink liquids. CSW relaying the concerns to RN and MD. [facility] team will continue to follow and assist with discharge planning needs.   Expected Discharge Plan: Skilled Nursing Facility Barriers to Discharge: Ship broker, Continued Medical Work up   Patient Goals and CMS Choice   CMS Medicare.gov Compare Post Acute Care list provided to:: Patient Choice offered to / list presented to : Patient  Expected Discharge Plan and Services Expected Discharge Plan: [place]       Living arrangements for the past 2 months: Single Family Home                                      Prior Living Arrangements/Services Living arrangements for the past 2 months: Single Family Home Lives with:: Self Patient language and need for interpreter reviewed:: Yes Do you feel safe going back to the place where you live?: Yes      Need for Family  Participation in Patient Care: Yes (Comment) Care giver support system in place?: Yes (comment)   Criminal Activity/Legal Involvement Pertinent to Current Situation/Hospitalization: No - Comment as needed  Activities of Daily Living Home Assistive Devices/Equipment: Walker (specify type), Raised toilet seat with rails ADL Screening (condition at time of admission) Patient's cognitive ability adequate to safely complete daily activities?: Yes Is the patient deaf or have difficulty hearing?: No Does the patient have difficulty seeing, even when wearing glasses/contacts?: Yes Does the patient have difficulty concentrating, remembering, or making decisions?: Yes Patient able to express need for assistance with ADLs?: Yes Does the patient have difficulty dressing or bathing?: Yes Independently performs ADLs?: No Communication: Independent Grooming: Independent Feeding: Independent Bathing: Needs assistance Is this a change from baseline?: Pre-admission baseline Toileting: Needs assistance Is this a change from baseline?: Pre-admission baseline In/Out Bed: Needs assistance Is this a change from baseline?: Pre-admission baseline Walks in Home: Needs assistance Is this a change from baseline?: Pre-admission baseline Does the patient have difficulty walking or climbing stairs?: Yes Weakness of Legs: None Weakness of Arms/Hands: Right  Permission Sought/Granted Permission sought to share information with : Facility Sport and exercise psychologist, Family Supports Permission granted to share information with : Yes, Verbal Permission Granted  Share Information with NAME: Eulas Post  Permission granted to share info w AGENCY: SNFs  Permission granted to share info w Relationship: Spouse  Permission granted to share info w Contact Information: [phone]  Emotional Assessment Appearance:: Appears stated age Attitude/Demeanor/Rapport: Unable to Assess Affect (typically observed): Unable to  Assess Orientation: : Oriented to Self, Oriented to Place, Oriented to  Time, Oriented to Situation Alcohol / Substance Use: Not Applicable Psych Involvement: No (comment)  Admission diagnosis:  Hepatic dysfunction [K76.9] Metastatic cancer ([place]) [C79.9] Liver mass [R16.0] AKI (acute kidney injury) ([place]) [N17.9] Metastatic neoplastic disease ([place]) [C79.9] Metastatic malignant neoplasm ([place]) [C79.9] Patient Active Problem List   Diagnosis Date Noted  . Palliative care by specialist   . Goals of care, counseling/discussion   . DNR (do not resuscitate)   . Metastatic cancer ([place]) 10/30/2019  . Hypercalcemia 10/29/2019  . AKI (acute kidney injury) ([place]) 10/29/2019  . Metastatic neoplastic disease ([place]) 10/29/2019  . Dehydration with hyponatremia 10/29/2019  . GERD without esophagitis 10/29/2019  . Polypharmacy   . Uncontrolled type 2 diabetes mellitus with hyperglycemia, with long-term current use of insulin ([place])   . Weakness 06/19/2019  . Cushingoid side effect of steroids ([place])   . Generalized weakness 06/17/2019  . Cellulitis of leg, left   . Elevated LFTs   . Mass in neck   . Cervical radiculopathy at C8 01/31/2019  . Right arm numbness 01/30/2019  . Chronic migraine 12/06/2017  . Chronic intractable headache 10/23/2017  . Cervical myelopathy with cervical radiculopathy 02/15/2016   PCP:  Leonides Sake, MD Pharmacy:   [id number] [place], [id number]: [phone]: [phone]    Social Determinants of Health (SDOH) Interventions    Readmission Risk Interventions No flowsheet data found.

## 2019-11-02 NOTE — Progress Notes (Signed)
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 Recently getting extremely debilitated and unable to walk since last 2 weeks. In the emergency room, hemodynamically stable.  Clinically dehydrated.  CT scan abdomen pelvis shows extensive hepatic metastatic disease, omental caking and complex cyst left lower quadrant, multiple lymphadenopathy.   Assessment & Plan:   Principal Problem:   Metastatic neoplastic disease ([place]) Active Problems:   Uncontrolled type 2 diabetes mellitus with hyperglycemia, with long-term current use of insulin ([facility])   Hypercalcemia   AKI (acute kidney injury) ([place])   Dehydration with hyponatremia   GERD without esophagitis   Metastatic cancer ([place])  Suspected metastatic disease: Primary source unknown.  Suspect primary GI source. Status post ultrasound-guided liver biopsy 5/14.  Results pending.  Symptomatic treatment now. CT scan of the chest consistent with diffuse lymphadenopathy. MRI of the brain with no evidence of meningeal disease, parenchymal metastasis. She does have severe comorbidities, now with advanced debility. No evidence of fulminant hepatic failure, however will start patient on lactulose.  There was no ascites on examination. Decrease dose of Celexa to 20 mg daily, adjust for age. Her cancer diagnosis is pending, however patient has severe comorbidities and now with advanced physical debility.  Unsure whether patient will be a treatment candidate for cancer.  I will defer this discussion to her oncologist. We will consult palliative care medicine to coordinate and start  counseling. GI to see patient on 5/17.  Acute kidney injury: With ongoing poor appetite and intake.  Continue maintenance IV fluids. Encourage oral intake.  Continue to monitor renal functions.  Some improvement today.  Urine output is adequate.  Type 2 diabetes uncontrolled with hyperglycemia: Currently with poor appetite and n.p.o. A1c 4.8.  Keep on sliding scale insulin until normal appetite.  Dehydration with hyponatremia: Treated with isotonic fluid with improvement.  Continue.   GERD without esophagitis: On Protonix at home.  Continue protonix. Change to oral.   Extreme physical debility and deconditioning: Continue to work with PT OT.  Will probably benefit with rehab.  Also depends upon whether her cancer is treatable or not.  Lactic acidosis: Due to liver function abnormality.  No evidence of active infection.  Lethargy/somnolence: Probably due to ongoing medical issues. She does have history of sleep apnea, will use CPAP.  CO2 is normal.  Morbid obesity:Body mass index is 40.83 kg/m.   DVT prophylaxis: SCDs Code Status: Full code Family Communication: Husband at the bedside 5/15 Disposition Plan: Status is: Inpatient.  The patient will require care spanning > 2 midnights and should be moved to inpatient because: Persistent severe electrolyte disturbances, Ongoing diagnostic testing needed not appropriate for outpatient work up and Inpatient level of care appropriate due to severity of illness  Dispo: The patient is from: Home              Anticipated d/c is to: SNF              Anticipated d/c date is: 2 days              Patient currently is not medically stable to d/c.  Patient with extreme physical debility, new diagnosis  of cancer and needing inpatient investigations and treatment.     Consultants:   Oncology  Interventional radiology  Procedures:   Ultrasound-guided liver biopsy, 5/14.  Antimicrobials:   None   Subjective: Seen and examined.  No  overnight events.  Just sleepy.  She was slightly more awake and able to keep up some conversation today.  Does not want to eat.  No appetite.  Objective: Vitals:   11/01/19 1628 11/01/19 2130 11/02/19 0531 11/02/19 1004  BP: 118/68 110/62 (!) 96/48 (!) 102/55  Pulse: 91 90 83 86  Resp: 18 15 15 16   Temp: 98.3 F (36.8 C) 98.4 F (36.9 C) 98.4 F (36.9 C) 98.4 F (36.9 C)  TempSrc: Oral   Oral  SpO2: 94% 95% 94% 96%  Weight:  107.9 kg    Height:        Intake/Output Summary (Last 24 hours) at 11/02/2019 1211 Last data filed at 11/02/2019 0900 Gross per 24 hour  Intake 1737.58 ml  Output 600 ml  Net 1137.58 ml   Filed Weights   10/30/19 2118 10/31/19 2142 11/01/19 2130  Weight: 106.8 kg 107.9 kg 107.9 kg    Examination:  General exam: Appears calm and comfortable, chronically sick looking.  Anxious. Respiratory system: Clear to auscultation. Respiratory effort normal. Cardiovascular system: S1 & S2 heard, RRR. No JVD, murmurs, rubs, gallops or clicks. No pedal edema. Gastrointestinal system: Abdomen is distended.  Palpable nodular liver.  Obese and pendulous.  No localized tenderness rigidity or guarding. Central nervous system: Alert and oriented. No focal neurological deficits. Patient was confabulating and repeating same sentences over and over again. Extremities: Symmetric 5 x 5 power. Skin: No rashes, lesions or ulcers Psychiatry: Judgement and insight appear normal. Mood & affect anxious.    Data Reviewed: I have personally reviewed following labs and imaging studies  CBC: Recent Labs  Lab 10/29/19 1719 10/30/19 0426 10/31/19 0455 11/01/19 0922 11/02/19 0339  WBC 12.3* 12.5* 12.0*  --  13.9*  NEUTROABS  --  10.5* 8.6*  --  10.1*  HGB 12.2 11.1* 10.5* 10.6* 10.8*  HCT 40.1 35.4* 34.3* 34.3* 35.4*  MCV 95.5 92.9 95.5  --  98.6  PLT 436* 420* 337  --  0000000   Basic Metabolic Panel: Recent Labs  Lab 10/29/19 1857 10/30/19 0426 10/31/19 0455  11/01/19 0424 11/02/19 0339  NA 133* 135 138 139 140  K 3.7 3.6 4.0 4.2 4.1  CL 94* 100 103 107 108  CO2 23 23 23  21* 22  GLUCOSE 134* 82 85 87 106*  BUN 29* 24* 19 19 17   CREATININE 1.59* 1.26* 0.96 1.03* 0.88  CALCIUM 10.6* 10.3 10.3 10.6* 11.0*  MG  --  1.9 1.8  --   --   PHOS  --   --  2.8  --   --    GFR: Estimated Creatinine Clearance: 69.3 mL/min (by C-G formula based on SCr of 0.88 mg/dL). Liver Function Tests: Recent Labs  Lab 10/30/19 0015 10/30/19 0426 10/31/19 0455 11/01/19 0424 11/02/19 0339  AST 234* 239* 239* 249* 226*  ALT 43 43 42 45* 42  ALKPHOS 1,941* 1,970* 924* 1,484* 1,583*  BILITOT 2.2* 2.3* 2.7* 3.9* 2.5*  PROT 5.9* 5.7* 5.4* 5.6* 5.3*  ALBUMIN 2.0* 2.0* 1.7* 1.8* 1.7*   Recent Labs  Lab 10/29/19 1857  LIPASE 33   Recent Labs  Lab 11/01/19 0922 11/02/19 0339  AMMONIA 61* 76*   Coagulation Profile: Recent Labs  Lab 10/30/19 0015 11/02/19  [id number]  INR 1.1 1.1   Cardiac Enzymes: No results for input(s): CKTOTAL, CKMB, CKMBINDEX, TROPONINI in the last 168 hours. BNP (last 3 results) No results for input(s): PROBNP in the last 8760 hours. HbA1C: No results for input(s): HGBA1C in the last 72 hours. CBG: Recent Labs  Lab 11/01/19 1141 11/01/19 1629 11/01/19 2130 11/02/19 0656 11/02/19 1141  GLUCAP 93 111* 110* 81 138*   Lipid Profile: No results for input(s): CHOL, HDL, LDLCALC, TRIG, CHOLHDL, LDLDIRECT in the last 72 hours. Thyroid Function Tests: No results for input(s): TSH, T4TOTAL, FREET4, T3FREE, THYROIDAB in the last 72 hours. Anemia Panel: No results for input(s): VITAMINB12, FOLATE, FERRITIN, TIBC, IRON, RETICCTPCT in the last 72 hours. Sepsis Labs: Recent Labs  Lab 10/30/19 0015 10/30/19 0427  LATICACIDVEN 3.1* 2.6*    Recent Results (from the past 240 hour(s))  SARS Coronavirus 2 by RT PCR (hospital order, performed in [facility] hospital lab) Nasopharyngeal Nasopharyngeal Swab     Status: None   Collection  Time: 10/29/19 10:49 PM   Specimen: Nasopharyngeal Swab  Result Value Ref Range Status   SARS Coronavirus 2 NEGATIVE NEGATIVE Final    Comment: (NOTE) SARS-CoV-2 target nucleic acids are NOT DETECTED. The SARS-CoV-2 RNA is generally detectable in upper and lower respiratory specimens during the acute phase of infection. The lowest concentration of SARS-CoV-2 viral copies this assay can detect is 250 copies / mL. A negative result does not preclude SARS-CoV-2 infection and should not be used as the sole basis for treatment or other patient management decisions.  A negative result may occur with improper specimen collection / handling, submission of specimen other than nasopharyngeal swab, presence of viral mutation(s) within the areas targeted by this assay, and inadequate number of viral copies (<250 copies / mL). A negative result must be combined with clinical observations, patient history, and epidemiological information. Fact Sheet for Patients:   StrictlyIdeas.no Fact Sheet for Healthcare Providers: BankingDealers.co.za This test is not yet approved or cleared  by the [place] FDA and has been authorized for detection and/or diagnosis of SARS-CoV-2 by FDA under an Emergency Use Authorization (EUA).  This EUA will remain in effect (meaning this test can be used) for the duration of the COVID-19 declaration under Section 564(b)(1) of the Act, 21 U.S.C. section 360bbb-3(b)(1), unless the authorization is terminated or revoked sooner. Performed at [facility], [place] [address] [place], [place] [address]          Radiology Studies: MR BRAIN W WO CONTRAST  Result Date: 10/31/2019 CLINICAL DATA:  Weakness, metastases EXAM: MRI HEAD WITHOUT AND WITH CONTRAST TECHNIQUE: Multiplanar, multiecho pulse sequences of the brain and surrounding structures were obtained without and with intravenous contrast. CONTRAST:  53mL GADAVIST  GADOBUTROL 1 MMOL/ML IV SOLN COMPARISON:  09/30/2018 FINDINGS: Motion artifact is present Brain: There is no acute infarction or intracranial hemorrhage. There is no intracranial mass, mass effect, or edema. There is no hydrocephalus or extra-axial fluid collection. Ventricles and sulci are stable in size and configuration. Patchy T2 hyperintensity in the supratentorial and pontine white matter is nonspecific but probably reflects mild chronic microvascular ischemic changes. No abnormal enhancement. Vascular: Major vessel flow voids at the skull base are preserved. Skull and upper cervical spine: Normal marrow signal is preserved. There is susceptibility artifact related to partially imaged cervical spine fusion. Sinuses/Orbits: Paranasal sinuses are aerated. Orbits are unremarkable. Other: Sella is unremarkable.  Mastoid air cells are clear. IMPRESSION: Suboptimal evaluation due to motion artifact.  No evidence of intracranial metastatic disease. No acute infarction or hemorrhage. Electronically Signed   By: Macy Mis M.D.   On: 10/31/2019 14:24        Scheduled Meds: . citalopram  20 mg Oral Daily  . feeding supplement (ENSURE ENLIVE)  237 mL Oral TID BM  . fluticasone  1 spray Each Nare BID  . insulin aspart  0-15 Units Subcutaneous TID AC & HS  . lactulose  20 g Oral TID  . nystatin  5 mL Mouth/Throat QID  . nystatin   Topical TID  . pantoprazole  40 mg Oral Q supper   Continuous Infusions: . sodium chloride 75 mL/hr at 11/02/19 0720     LOS: 3 days    Time spent: 25 minutes    Barb Merino, MD [facility] Pager [phone]

## 2019-11-02 NOTE — Consult Note (Signed)
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 Recently getting extremely debilitated and unable to walk since last 2 weeks. In the emergency room, hemodynamically stable.  Clinically dehydrated.  CT scan abdomen pelvis shows extensive hepatic metastatic disease, omental caking and complex cyst left lower quadrant, multiple lymphadenopathy.  Palliative care was asked to get involved in the setting of likely metastatic cancer.    Clinical Assessment/Goals of Care: I have reviewed medical records including EPIC notes, labs and imaging, received report from bedside RN, assessed the patient.    I met with Domingo Sep and Wilson Singer to further discuss diagnosis prognosis, GOC, EOL wishes, disposition and options.   I introduced Palliative Medicine as specialized medical care for people living with serious illness. It focuses on providing relief from the symptoms and stress of a serious illness. The goal is to improve quality of life for both the patient and the family.  I asked Aubrina to tell me about herself. She states that she is from [place] originally. She has been married to Danielson for 31 years though they live in separate houses. They share a son and daughter together. 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 She is of the [employer].  Prior to the end of November the patient was doing fairly well. She had persistent headaches and was started on high dose steroid for temporal arteritis. She has suffered a multitude of complicates secondary to this including worsening  weakness, impaired vision, and altered state. Her family emphasizes that prior to this diagnosis and treatment was was performing all of her own bADLs and driving. She does have a cane at home which she uses from time to time. She apparently moved slowly and suffered from L hand weakness after a cervical neck procedure.   In terms of her present living arrangement, Jimia lives by herself and relies on her husband to come by in the morning to set her breakfast up. He provides care for her and her animals during the daytime. He then goes home in the evening.   Eulas Post shares concerns over Maryse's altered level of consciousness. He believes that she is better than yesterday but inquires as to why she is so altered. He shares that he had been informed by the medical team prior that it was related to her pain medications. I told Eulas Post that I would inform the [facility] team of these ongoing worries.  Regarding Keli's present health state we discussed her presumed metastatic hepatic disease. We talked about options. She is interested to identify if anything would be deemed as treatable by oncology. If not she is familiar with hospice from bother of her parents and would be interested in this as a next step.   A detailed discussion was had today regarding advanced directives, there are none on file though Giselle shares that she would rely on her husband Vanity Larsson to make decisions for her if she were unable to do so.    Concepts specific to code status, artifical feeding and hydration, continued IV antibiotics and rehospitalization was had. Per Mardene Celeste and her family many  of these decisions would rest upon her final diagnosis. Shamone herself would not want resuscitation or heroic measures to sustain her life. She is a DNAR/DNI.  The difference between a aggressive medical intervention path  and a palliative comfort care path for this patient at this time was had. Carreen would be open to hospice if  there are not treatment options to offer.  In the meanwhile Makyra has expressed interest in gaining strength with the help of PT/OT she would at this point in time be interested in SNF if it were offered.  Discussed the importance of continued conversation with family and their  medical providers regarding overall plan of care and treatment options, ensuring decisions are within the context of the patients values and GOCs.  Palliative care will continue to follow and offer to support to patient and her family during this difficult time.   Decision Maker:  SUMMARY OF RECOMMENDATIONS   DNAR/DNI  Continue current scope of care  Await biopsy results for further planning  Patient would like to go to SNF though if her prognosis is poor with limited time she would want to go home on hospice case  Chaplain consult  Code Status/Advance Care Planning: DNAR/DNI   Palliative Prophylaxis:   Constipation, Pain, Turn Q2H, Delirium Precautions  Additional Recommendations (Limitations, Scope, Preferences):  Treat what is treatable    Psycho-social/Spiritual:   Desire for further Chaplaincy support: Yes  Additional Recommendations: Education on Hospice   Prognosis: If identified to have metaastatic hepatic cancer outlook would be poor weeks to months.   Discharge Planning: Unclear at present time  PPS: 30%   This conversation/these recommendations were discussed with patient primary care team, Dr. Sloan Leiter  Time In: 73 Time Out: 1200 Total Time: 90 Greater than 50%  of this time was spent counseling and coordinating care related to the above assessment and plan.  [place] Team Team Cell Phone: [phone] Please utilize secure chat with additional questions, if there is no response within 30 minutes please call the above phone number  Palliative Medicine Team providers are available by phone from 7am to 7pm daily and can be reached  through the team cell phone.  Should this patient require assistance outside of these hours, please call the patient's attending physician.

## 2019-11-02 NOTE — Progress Notes (Signed)
PHARMACIST - PHYSICIAN COMMUNICATION  DR:   Raelyn Mora  CONCERNING: IV to Oral Route Change Policy  RECOMMENDATION: This patient is receiving Protonix by the intravenous route.  Based on criteria approved by the Pharmacy and Therapeutics Committee, the intravenous medication(s) is/are being converted to the equivalent oral dose form(s).   DESCRIPTION: These criteria include:  The patient is eating (either orally or via tube) and/or has been taking other orally administered medications for a least 24 hours  The patient has no evidence of active gastrointestinal bleeding or impaired GI absorption (gastrectomy, short bowel, patient on TNA or NPO).  If you have questions about this conversion, please contact the Pharmacy Department  []   [phone] )  [facility] []   [phone] )  [facility] [x]   [phone] )  [facility] []   [phone] )  [facility] []   [phone] )  [facility]

## 2019-11-02 NOTE — NC FL2 (Signed)
[place] LEVEL OF CARE SCREENING TOOL     IDENTIFICATION  Patient Name: [name] Birthdate: [birth year] Sex: female Admission Date (Current Location): 10/29/2019  [facility] and [place] Number:  Herbalist and Address:  [facility], [place] [address] [place], [place] [address]      Provider Number: [id number]  Attending Physician Name and Address:  Barb Merino, MD  Relative Name and Phone Number:  Mahika Brackins    Current Level of Care: Hospital Recommended Level of Care: [place] Prior Approval Number:    Date Approved/Denied:   PASRR Number: [id number]  Discharge Plan: SNF    Current Diagnoses: Patient Active Problem List   Diagnosis Date Noted  . Palliative care by specialist   . Goals of care, counseling/discussion   . DNR (do not resuscitate)   . Metastatic cancer ([employer]) 10/30/2019  . Hypercalcemia 10/29/2019  . AKI (acute kidney injury) ([place]) 10/29/2019  . Metastatic neoplastic disease ([place]) 10/29/2019  . Dehydration with hyponatremia 10/29/2019  . GERD without esophagitis 10/29/2019  . Polypharmacy   . Uncontrolled type 2 diabetes mellitus with hyperglycemia, with long-term current use of insulin ([place])   . Weakness 06/19/2019  . Cushingoid side effect of steroids ([place])   . Generalized weakness 06/17/2019  . Cellulitis of leg, left   . Elevated LFTs   . Mass in neck   . Cervical radiculopathy at C8 01/31/2019  . Right arm numbness 01/30/2019  . Chronic migraine 12/06/2017  . Chronic intractable headache 10/23/2017  . Cervical myelopathy with cervical radiculopathy 02/15/2016    Orientation RESPIRATION BLADDER Height & Weight     Self, Time, Situation, Place  Normal Continent, External catheter Weight: 237 lb 14.1 oz (107.9 kg) Height:  5\' 4"  (162.6 cm)  BEHAVIORAL SYMPTOMS/MOOD NEUROLOGICAL BOWEL NUTRITION STATUS      Continent Diet(See discharge summary)  AMBULATORY STATUS  COMMUNICATION OF NEEDS Skin   Extensive Assist Verbally Other (Comment)(Closed incision, right upper abdomen. Adhesive bandage.)                       Personal Care Assistance Level of Assistance  Bathing, Dressing, Feeding Bathing Assistance: Maximum assistance Feeding assistance: Maximum assistance Dressing Assistance: Maximum assistance     Functional Limitations Info  Sight, Hearing, Speech Sight Info: Adequate Hearing Info: Adequate Speech Info: Adequate    SPECIAL CARE FACTORS FREQUENCY  PT (By licensed PT), OT (By licensed OT)     PT Frequency: 5x a week OT Frequency: 5x a week            Contractures Contractures Info: Not present    Additional Factors Info  Code Status, Allergies Code Status Info: DNR Allergies Info: Nickel           Current Medications (11/02/2019):  This is the current hospital active medication list Current Facility-Administered Medications  Medication Dose Route Frequency Provider Last Rate Last Admin  . 0.9 %  sodium chloride infusion   Intravenous Continuous Barb Merino, MD 75 mL/hr at 11/02/19 0720 New Bag at 11/02/19 0720  . acetaminophen (TYLENOL) tablet 650 mg  650 mg Oral Q6H PRN Vernelle Emerald, MD       Or  . acetaminophen (TYLENOL) suppository 650 mg  650 mg Rectal Q6H PRN Shalhoub, Sherryll Burger, MD      . citalopram (CELEXA) tablet 20 mg  20 mg Oral Daily Barb Merino, MD   20 mg at 11/02/19  0935  . feeding supplement (ENSURE ENLIVE) (ENSURE ENLIVE) liquid 237 mL  237 mL Oral TID BM Barb Merino, MD   237 mL at 11/02/19 0935  . fluticasone (FLONASE) 50 MCG/ACT nasal spray 1 spray  1 spray Each Nare BID Shalhoub, Sherryll Burger, MD   1 spray at 11/01/19 2226  . insulin aspart (novoLOG) injection 0-15 Units  0-15 Units Subcutaneous TID AC & HS Shalhoub, Sherryll Burger, MD   2 Units at 10/30/19 2202  . lactulose (CHRONULAC) 10 GM/15ML solution 20 g  20 g Oral TID Barb Merino, MD   20 g at 11/02/19 1036  . oxyCODONE (Oxy  IR/ROXICODONE) immediate release tablet 5 mg  5 mg Oral Q4H PRN Vernelle Emerald, MD   5 mg at 11/01/19 2226   Or  . morphine 2 MG/ML injection 2 mg  2 mg Intravenous Q4H PRN Shalhoub, Sherryll Burger, MD      . nystatin (MYCOSTATIN) 100000 UNIT/ML suspension 500,000 Units  5 mL Mouth/Throat QID Barb Merino, MD   500,000 Units at 11/02/19 0935  . nystatin (MYCOSTATIN/NYSTOP) topical powder   Topical TID Maryanna Shape, NP   Given at 11/02/19 [phone]  . ondansetron (ZOFRAN) tablet 4 mg  4 mg Oral Q6H PRN Shalhoub, Sherryll Burger, MD   4 mg at 10/30/19 2202   Or  . ondansetron (ZOFRAN) injection 4 mg  4 mg Intravenous Q6H PRN Shalhoub, Sherryll Burger, MD      . pantoprazole (PROTONIX) EC tablet 40 mg  40 mg Oral Q supper Barb Merino, MD         Discharge Medications: Please see discharge summary for a list of discharge medications.  Relevant Imaging Results:  Relevant Lab Results:   Additional Information SSN [id number]  Neysa Hotter [place], [place]

## 2019-11-03 ENCOUNTER — Other Ambulatory Visit: Payer: Self-pay

## 2019-11-03 LAB — COMPREHENSIVE METABOLIC PANEL
ALT: 42 U/L (ref 0–44)
AST: 212 U/L — ABNORMAL HIGH (ref 15–41)
Albumin: 1.6 g/dL — ABNORMAL LOW (ref 3.5–5.0)
Alkaline Phosphatase: 1408 U/L — ABNORMAL HIGH (ref 38–126)
Anion gap: 9 (ref 5–15)
BUN: 15 mg/dL (ref 8–23)
CO2: 21 mmol/L — ABNORMAL LOW (ref 22–32)
Calcium: 10.8 mg/dL — ABNORMAL HIGH (ref 8.9–10.3)
Chloride: 108 mmol/L (ref 98–111)
Creatinine, Ser: 0.75 mg/dL (ref 0.44–1.00)
GFR calc Af Amer: >60 mL/min (ref >60)
GFR calc non Af Amer: >60 mL/min (ref >60)
Glucose, Bld: 100 mg/dL — ABNORMAL HIGH (ref 70–99)
Potassium: 4.4 mmol/L (ref 3.5–5.1)
Sodium: 138 mmol/L (ref 135–145)
Total Bilirubin: 2.4 mg/dL — ABNORMAL HIGH (ref 0.3–1.2)
Total Protein: 5.1 g/dL — ABNORMAL LOW (ref 6.5–8.1)

## 2019-11-03 LAB — CBC WITH DIFFERENTIAL/PLATELET
Abs Immature Granulocytes: 0.39 10*3/uL — ABNORMAL HIGH (ref 0.00–0.07)
Basophils Absolute: 0.1 10*3/uL (ref 0.0–0.1)
Basophils Relative: 1 %
Eosinophils Absolute: 0.2 10*3/uL (ref 0.0–0.5)
Eosinophils Relative: 1 %
HCT: 36.8 % (ref 36.0–46.0)
Hemoglobin: 11.2 g/dL — ABNORMAL LOW (ref 12.0–15.0)
Immature Granulocytes: 3 %
Lymphocytes Relative: 10 %
Lymphs Abs: 1.4 10*3/uL (ref 0.7–4.0)
MCH: 29.9 pg (ref 26.0–34.0)
MCHC: 30.4 g/dL (ref 30.0–36.0)
MCV: 98.1 fL (ref 80.0–100.0)
Monocytes Absolute: 1.6 10*3/uL — ABNORMAL HIGH (ref 0.1–1.0)
Monocytes Relative: 12 %
Neutro Abs: 10.1 10*3/uL — ABNORMAL HIGH (ref 1.7–7.7)
Neutrophils Relative %: 73 %
Platelets: 322 10*3/uL (ref 150–400)
RBC: 3.75 MIL/uL — ABNORMAL LOW (ref 3.87–5.11)
RDW: 25 % — ABNORMAL HIGH (ref 11.5–15.5)
WBC: 13.8 10*3/uL — ABNORMAL HIGH (ref 4.0–10.5)
nRBC: 0.5 % — ABNORMAL HIGH (ref 0.0–0.2)

## 2019-11-03 LAB — GLUCOSE, CAPILLARY
Glucose-Capillary: 86 mg/dL (ref 70–99)
Glucose-Capillary: 106 mg/dL — ABNORMAL HIGH (ref 70–99)
Glucose-Capillary: 96 mg/dL (ref 70–99)
Glucose-Capillary: 107 mg/dL — ABNORMAL HIGH (ref 70–99)

## 2019-11-03 LAB — AMMONIA: Ammonia: 86 umol/L — ABNORMAL HIGH (ref 9–35)

## 2019-11-03 MED ORDER — CALCITONIN (SALMON) 200 UNIT/ML IJ SOLN
400.0000 [IU] | Freq: Two times a day (BID) | INTRAMUSCULAR | Status: AC
  Administered 2019-11-03 – 2019-11-05 (×4): 400 [IU] via INTRAMUSCULAR
  Filled 2019-11-03 (×5): qty 2

## 2019-11-03 MED ORDER — LACTULOSE 10 GM/15ML PO SOLN
30.0000 g | Freq: Three times a day (TID) | ORAL | Status: DC
  Administered 2019-11-03 – 2019-11-04 (×2): 30 g via ORAL
  Filled 2019-11-03 (×3): qty 45

## 2019-11-03 MED ORDER — ZOLEDRONIC ACID 4 MG/5ML IV CONC
4.0000 mg | Freq: Once | INTRAVENOUS | Status: AC
  Administered 2019-11-03: 4 mg via INTRAVENOUS
  Filled 2019-11-03: qty 5

## 2019-11-03 MED ORDER — FUROSEMIDE 10 MG/ML IJ SOLN
40.0000 mg | Freq: Once | INTRAMUSCULAR | Status: AC
  Administered 2019-11-03: 40 mg via INTRAVENOUS
  Filled 2019-11-03: qty 4

## 2019-11-03 MED ORDER — BISACODYL 10 MG RE SUPP
10.0000 mg | Freq: Once | RECTAL | Status: AC
  Administered 2019-11-03: 10 mg via RECTAL
  Filled 2019-11-03: qty 1

## 2019-11-03 MED ORDER — ZOLEDRONIC ACID 4 MG/100ML IV SOLN
4.0000 mg | Freq: Once | INTRAVENOUS | Status: DC
  Filled 2019-11-03 (×2): qty 100

## 2019-11-03 NOTE — Progress Notes (Signed)
Physical Therapy Treatment Patient Details Name: TIAA KIAH MRN: [id number] DOB: December 11, 1945 Today's Date: 11/03/2019    History of Present Illness Pt is a 74 y/o female admitted secondary to abdominal distention and pain. 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 Pt likely for liver biopsy on 5/14. PMH includes DM, temporal arteritis, breast cancer, OSA on CPAP, R TKA, and ACDF.     PT Comments    Continuing work on functional mobility and activity tolerance;  Session focused on bed mobility, and assessment for changes in ability to participate compared to last session; [name] required extensive assist to sit up on the EOB for approx 10 minutes today; Eyes closed at least half of the session; No appreciable difference in functionality from last session   Follow Up Recommendations  SNF;Supervision for mobility/OOB     Equipment Recommendations  Wheelchair cushion (measurements PT);Wheelchair (measurements PT)(hoyer lift)    Recommendations for Other Services       Precautions / Restrictions Precautions Precautions: Fall    Mobility  Bed Mobility Overal bed mobility: Needs Assistance Bed Mobility: Supine to Sit;Sit to Supine;Rolling Rolling: Max assist   Supine to sit: Max assist;+2 for physical assistance Sit to supine: Max assist;+2 for physical assistance   General bed mobility comments: Max assist for all elements of bed mobility; Rolled to help with hygeine and bed change; Heavy assist to elevate trunk to sit  Transfers Overall transfer level: Needs assistance   Transfers: Lateral/Scoot Transfers          Lateral/Scoot Transfers: Total assist;+2 physical assistance General transfer comment: attemtped to simulate lateral scoot transfer by scooting to [facility]; difficulty with weight shifting, and ultimately did not make appreciable move with +2 tot A  Ambulation/Gait                 Stairs             Wheelchair Mobility     Modified Rankin (Stroke Patients Only)       Balance     Sitting balance-Leahy Scale: Poor Sitting balance - Comments: Leaning R and posterioly, requiring min-mod assist to sit EOB and maintain balance.  Sat EOB 6 min  Postural control: Posterior lean;Right lateral lean                                  Cognition Arousal/Alertness: Lethargic Behavior During Therapy: Flat affect Overall Cognitive Status: Difficult to assess Area of Impairment: Attention;Following commands;Awareness;Problem solving                   Current Attention Level: Sustained   Following Commands: Follows one step commands inconsistently;Follows one step commands with increased time     Problem Solving: Slow processing;Decreased initiation;Difficulty sequencing;Requires tactile cues General Comments: Pt lethargic with minimal eye opening through session, reports it is due to pain (headache).  Pt was able to follow some commands when sitting EOB but remained significantly distracted by pain and needed frequent cues      Exercises      General Comments        Pertinent Vitals/Pain Pain Assessment: Faces Faces Pain Scale: Hurts little more Pain Location: abdomen, especially with upright position and rolling in bed Pain Descriptors / Indicators: Discomfort;Headache;Grimacing;Sore Pain Intervention(s): Monitored during session    Home Living  Prior Function            PT Goals (current goals can now be found in the care plan section) Acute Rehab PT Goals Patient Stated Goal: Did not state PT Goal Formulation: With patient Time For Goal Achievement: 11/13/19 Potential to Achieve Goals: Fair Progress towards PT goals: Not progressing toward goals - comment(limited by lethargy)    Frequency    Min 2X/week      PT Plan Current plan remains appropriate;Frequency needs to be updated    Co-evaluation              AM-PAC PT "6  Clicks" Mobility   Outcome Measure  Help needed turning from your back to your side while in a flat bed without using bedrails?: A Lot Help needed moving from lying on your back to sitting on the side of a flat bed without using bedrails?: Total Help needed moving to and from a bed to a chair (including a wheelchair)?: Total Help needed standing up from a chair using your arms (e.g., wheelchair or bedside chair)?: Total Help needed to walk in hospital room?: Total Help needed climbing 3-5 steps with a railing? : Total 6 Click Score: 7    End of Session Equipment Utilized During Treatment: Other (comment)(bed pad) Activity Tolerance: Patient limited by lethargy Patient left: in bed;with call bell/phone within reach;with bed alarm set(bed in semi-chair postition) Nurse Communication: Mobility status PT Visit Diagnosis: Muscle weakness (generalized) (M62.81);Difficulty in walking, not elsewhere classified (R26.2);Unsteadiness on feet (R26.81)     Time: [id number] PT Time Calculation (min) (ACUTE ONLY): 20 min  Charges:  $Therapeutic Activity: 8-22 mins                     Roney Marion, PT  Acute Rehabilitation Services Pager [phone] Office [place] 11/03/2019, 7:01 PM

## 2019-11-03 NOTE — Progress Notes (Signed)
PROGRESS NOTE    [name]  [id number] DOB: January 03, 1946 DOA: 10/29/2019 PCP: Leonides Sake, MD    Brief Narrative:  Patient is 74 year old female with history of type 2 diabetes on insulin, temporal arteritis recently treated with very high dose of steroids, GERD, migraine headaches, obstructive sleep apnea who presented to the emergency room with abnormal lab from her primary care physician's office. Recently getting extremely debilitated and unable to walk since last 2 weeks. In the emergency room, hemodynamically stable.  Clinically dehydrated.  CT scan abdomen pelvis shows extensive hepatic metastatic disease, omental caking and complex cyst left lower quadrant, multiple lymphadenopathy. 5/14: US guided liver biopsy by IR. Results pending.   Assessment & Plan:   Principal Problem:   Metastatic neoplastic disease ([place]) Active Problems:   Uncontrolled type 2 diabetes mellitus with hyperglycemia, with long-term current use of insulin ([facility])   Hypercalcemia   AKI (acute kidney injury) ([place])   Dehydration with hyponatremia   GERD without esophagitis   Metastatic cancer ([place])   Palliative care by specialist   Goals of care, counseling/discussion   DNR (do not resuscitate)  Suspected metastatic disease: Primary source unknown.  Suspected primary GI source. Also has significant dysphagia.  Status post ultrasound-guided liver biopsy 5/14.  Results pending.  Symptomatic treatment now. CT scan of the chest consistent with diffuse lymphadenopathy. MRI of the brain with no evidence of meningeal disease, parenchymal metastasis. She does have severe comorbidities, now with advanced debility. No evidence of fulminant hepatic failure. There was no ascites on examination. Decrease dose of Celexa to 20 mg daily, adjusted for age. Her cancer diagnosis is pending, however patient has severe comorbidities and now with advanced physical debility.  Unsure whether patient will be a  treatment candidate for cancer.  will defer this discussion to her oncologist. Consulted palliative care to continue discussion and counseling. As per Dr. Learta Codding, patient to be seen by GI today.  She may benefit with upper GI endoscopy due to significant dysphagia.  Acute kidney injury: With ongoing poor appetite and intake.  Renal function improved.  No adequate oral intake.  Keep on maintenance IV fluids.   Type 2 diabetes uncontrolled with hyperglycemia: Currently with poor appetite and n.p.o. A1c 4.8.  Keep on sliding scale insulin until normal appetite.  Dehydration with hyponatremia: Treated with isotonic fluid with improvement.  Continue.   GERD without esophagitis: On Protonix at home.  Continue protonix. Changed to oral.  No oropharyngeal candidiasis visible.  On nystatin swish and swallow.  To be seen by GI.  Extreme physical debility and deconditioning: Continue to work with PT OT.  Will probably benefit with rehab.  Also depends upon whether her cancer is treatable or not.  Lactic acidosis: Due to liver function abnormality.  No evidence of active infection.  Lethargy/somnolence: Probably due to ongoing medical issues. She does have history of sleep apnea, will use CPAP.  CO2 is normal.  Morbid obesity:Body mass index is 40.83 kg/m.   DVT prophylaxis: SCDs Code Status: Full code Family Communication: Husband at the bedside 5/15 Disposition Plan: Status is: Inpatient.  The patient will require care spanning > 2 midnights and should be moved to inpatient because: Persistent severe electrolyte disturbances, Ongoing diagnostic testing needed not appropriate for outpatient work up and Inpatient level of care appropriate due to severity of illness  Dispo: The patient is from: Home              Anticipated d/c is to: SNF  Anticipated d/c date is: 2 days              Patient currently is not medically stable to d/c.  Patient with extreme physical debility, new  diagnosis of cancer and needing inpatient investigations and treatment.     Consultants:   Oncology  Interventional radiology  Procedures:   Ultrasound-guided liver biopsy, 5/14.  Antimicrobials:   None   Subjective: Seen and examined.  Sleepy and tired all the time.  Could not eat any food.  Only able to eat liquid diet.  Objective: Vitals:   11/02/19 2142 11/02/19 2212 11/03/19 0452 11/03/19 0944  BP: 117/68  115/68 121/76  Pulse: 99 81 99 96  Resp: 15 19 16 18   Temp: 075-GRM F (579FGE C)  97.9 F (36.6 C) 98.3 F (36.8 C)  TempSrc:    Oral  SpO2: 95% 96% 94% 95%  Weight:      Height:        Intake/Output Summary (Last 24 hours) at 11/03/2019 1310 Last data filed at 11/03/2019 1200 Gross per 24 hour  Intake 2495.4 ml  Output 450 ml  Net 2045.4 ml   Filed Weights   10/30/19 2118 10/31/19 2142 11/01/19 2130  Weight: 106.8 kg 107.9 kg 107.9 kg    Examination:  General exam: Appears calm and comfortable, chronically sick looking and lethargic. Respiratory system: Clear to auscultation. Respiratory effort normal. Cardiovascular system: S1 & S2 heard, RRR. No JVD, murmurs, rubs, gallops or clicks. No pedal edema. Gastrointestinal system: Abdomen is distended.  Palpable nodular liver.  Obese and pendulous.  No localized tenderness rigidity or guarding. Central nervous system: Alert and oriented but very tired and sleepy. No focal neurological deficits. Extremities: Symmetric 5 x 5 power. Skin: No rashes, lesions or ulcers Psychiatry: Judgement and insight appear normal. Mood & affect anxious.    Data Reviewed: I have personally reviewed following labs and imaging studies  CBC: Recent Labs  Lab 10/29/19 1719 10/29/19 1719 10/30/19 0426 10/31/19 0455 11/01/19 0922 11/02/19 0339 11/03/19 0448  WBC 12.3*  --  12.5* 12.0*  --  13.9* 13.8*  NEUTROABS  --   --  10.5* 8.6*  --  10.1* 10.1*  HGB 12.2   < > 11.1* 10.5* 10.6* 10.8* 11.2*  HCT 40.1   < > 35.4*  34.3* 34.3* 35.4* 36.8  MCV 95.5  --  92.9 95.5  --  98.6 98.1  PLT 436*  --  420* 337  --  318 322   < > = values in this interval not displayed.   Basic Metabolic Panel: Recent Labs  Lab 10/29/19 1857 10/30/19 0426 10/31/19 0455 11/01/19 0424 11/02/19 0339  NA 133* 135 138 139 140  K 3.7 3.6 4.0 4.2 4.1  CL 94* 100 103 107 108  CO2 23 23 23  21* 22  GLUCOSE 134* 82 85 87 106*  BUN 29* 24* 19 19 17   CREATININE 1.59* 1.26* 0.96 1.03* 0.88  CALCIUM 10.6* 10.3 10.3 10.6* 11.0*  MG  --  1.9 1.8  --   --   PHOS  --   --  2.8  --   --    GFR: Estimated Creatinine Clearance: 69.3 mL/min (by C-G formula based on SCr of 0.88 mg/dL). Liver Function Tests: Recent Labs  Lab 10/30/19 0015 10/30/19 0426 10/31/19 0455 11/01/19 0424 11/02/19 0339  AST 234* 239* 239* 249* 226*  ALT 43 43 42 45* 42  ALKPHOS 1,941* 1,970* 924* 1,484* 1,583*  BILITOT 2.2*  2.3* 2.7* 3.9* 2.5*  PROT 5.9* 5.7* 5.4* 5.6* 5.3*  ALBUMIN 2.0* 2.0* 1.7* 1.8* 1.7*   Recent Labs  Lab 10/29/19 1857  LIPASE 33   Recent Labs  Lab 11/01/19 0922 11/02/19 0339 11/03/19 0448  AMMONIA 61* 76* 86*   Coagulation Profile: Recent Labs  Lab 10/30/19 0015 11/02/19 0339  INR 1.1 1.1   Cardiac Enzymes: No results for input(s): CKTOTAL, CKMB, CKMBINDEX, TROPONINI in the last 168 hours. BNP (last 3 results) No results for input(s): PROBNP in the last 8760 hours. HbA1C: No results for input(s): HGBA1C in the last 72 hours. CBG: Recent Labs  Lab 11/02/19 1141 11/02/19 1635 11/02/19 2143 11/03/19 0715 11/03/19 1108  GLUCAP 138* 115* 93 86 106*   Lipid Profile: No results for input(s): CHOL, HDL, LDLCALC, TRIG, CHOLHDL, LDLDIRECT in the last 72 hours. Thyroid Function Tests: No results for input(s): TSH, T4TOTAL, FREET4, T3FREE, THYROIDAB in the last 72 hours. Anemia Panel: No results for input(s): VITAMINB12, FOLATE, FERRITIN, TIBC, IRON, RETICCTPCT in the last 72 hours. Sepsis Labs: Recent Labs  Lab  10/30/19 0015 10/30/19 0427  LATICACIDVEN 3.1* 2.6*    Recent Results (from the past 240 hour(s))  SARS Coronavirus 2 by RT PCR (hospital order, performed in [facility] hospital lab) Nasopharyngeal Nasopharyngeal Swab     Status: None   Collection Time: 10/29/19 10:49 PM   Specimen: Nasopharyngeal Swab  Result Value Ref Range Status   SARS Coronavirus 2 NEGATIVE NEGATIVE Final    Comment: (NOTE) SARS-CoV-2 target nucleic acids are NOT DETECTED. The SARS-CoV-2 RNA is generally detectable in upper and lower respiratory specimens during the acute phase of infection. The lowest concentration of SARS-CoV-2 viral copies this assay can detect is 250 copies / mL. A negative result does not preclude SARS-CoV-2 infection and should not be used as the sole basis for treatment or other patient management decisions.  A negative result may occur with improper specimen collection / handling, submission of specimen other than nasopharyngeal swab, presence of viral mutation(s) within the areas targeted by this assay, and inadequate number of viral copies (<250 copies / mL). A negative result must be combined with clinical observations, patient history, and epidemiological information. Fact Sheet for Patients:   StrictlyIdeas.no Fact Sheet for Healthcare Providers: BankingDealers.co.za This test is not yet approved or cleared  by the [place] FDA and has been authorized for detection and/or diagnosis of SARS-CoV-2 by FDA under an Emergency Use Authorization (EUA).  This EUA will remain in effect (meaning this test can be used) for the duration of the COVID-19 declaration under Section 564(b)(1) of the Act, 21 U.S.C. section 360bbb-3(b)(1), unless the authorization is terminated or revoked sooner. Performed at [facility], [place] [address] [place], [place] [address]          Radiology Studies: No results found.      Scheduled  Meds: . citalopram  20 mg Oral Daily  . feeding supplement (ENSURE ENLIVE)  237 mL Oral TID BM  . fluticasone  1 spray Each Nare BID  . insulin aspart  0-15 Units Subcutaneous TID AC & HS  . lactulose  20 g Oral TID  . nystatin  5 mL Mouth/Throat QID  . nystatin   Topical TID  . pantoprazole  40 mg Oral Q supper   Continuous Infusions: . sodium chloride 75 mL/hr at 11/03/19 1200     LOS: 4 days    Time spent: 25 minutes    Barb Merino, MD  [facility] Pager [phone]

## 2019-11-03 NOTE — Progress Notes (Addendum)
Patient's office computer chart reviewed after I received a phone message from Dr. Benay Spice and her last endoscopy and colonoscopy was June of 2020 without any obvious and worrisome finding and will await liver biopsy and please call me if I could be of any further assistance but happy to proceed with endoscopic study if needed during this hospital stay and was saddened to hear about her condition

## 2019-11-03 NOTE — Plan of Care (Signed)
  Problem: Nutrition: Goal: Adequate nutrition will be maintained Outcome: Progressing   Problem: Coping: Goal: Level of anxiety will decrease Outcome: Progressing   

## 2019-11-03 NOTE — Progress Notes (Addendum)
HEMATOLOGY-ONCOLOGY PROGRESS NOTE  SUBJECTIVE: Much more somnolent this afternoon.  Fell asleep mid sentence when talking with me.  Husband and daughter at the bedside.  Hospitalist also came to speak with him.  PHYSICAL EXAMINATION:  Vitals:   11/03/19 0452 11/03/19 0944  BP: 115/68 121/76  Pulse: 99 96  Resp: 16 18  Temp: 97.9 F (36.6 C) 98.3 F (36.8 C)  SpO2: 94% 95%   Filed Weights   10/30/19 2118 10/31/19 2142 11/01/19 2130  Weight: 106.8 kg 107.9 kg 107.9 kg    Intake/Output from previous day: 05/16 0701 - 05/17 0700 In: 240 [P.O.:240] Out: 625 [Urine:625]  GENERAL: Resting, has difficulty staying awake ABDOMEN: Positive bowel sounds, abdomen soft, nontender.  Hepatomegaly noted. NEURO: alert & oriented x 3 with fluent speech  LABORATORY DATA:  I have reviewed the data as listed CMP Latest Ref Rng & Units 11/02/2019 11/01/2019 10/31/2019  Glucose 70 - 99 mg/dL 106(H) 87 85  BUN 8 - 23 mg/dL 17 19 19   Creatinine 0.44 - 1.00 mg/dL 0.88 1.03(H) 0.96  Sodium 135 - 145 mmol/L 140 139 138  Potassium 3.5 - 5.1 mmol/L 4.1 4.2 4.0  Chloride 98 - 111 mmol/L 108 107 103  CO2 22 - 32 mmol/L 22 21(L) 23  Calcium 8.9 - 10.3 mg/dL 11.0(H) 10.6(H) 10.3  Total Protein 6.5 - 8.1 g/dL 5.3(L) 5.6(L) 5.4(L)  Total Bilirubin 0.3 - 1.2 mg/dL 2.5(H) 3.9(H) 2.7(H)  Alkaline Phos 38 - 126 U/L 1,583(H) 1,484(H) 924(H)  AST 15 - 41 U/L 226(H) 249(H) 239(H)  ALT 0 - 44 U/L 42 45(H) 42    Lab Results  Component Value Date   WBC 13.8 (H) 11/03/2019   HGB 11.2 (L) 11/03/2019   HCT 36.8 11/03/2019   MCV 98.1 11/03/2019   PLT 322 11/03/2019   NEUTROABS 10.1 (H) 11/03/2019    CT CHEST WO CONTRAST  Result Date: 10/30/2019 CLINICAL DATA:  Metastatic malignancy EXAM: CT CHEST WITHOUT CONTRAST TECHNIQUE: Multidetector CT imaging of the chest was performed following the standard protocol without IV contrast. COMPARISON:  10/29/2019, 06/18/2019 FINDINGS: Cardiovascular: No pericardial  effusion. Normal caliber of the thoracic aorta. Minimal atherosclerosis. Mediastinum/Nodes: Numerous enlarged mediastinal lymph nodes are identified. Index lymph node in the right paratracheal region measures 21 x 21 mm, previous having measured 27 x 22 mm. Increased number of enlarged lymph nodes throughout the mediastinum compared to prior neck CT. The trachea, esophagus, and thyroid are unremarkable. Lungs/Pleura: There is a small spiculated sub solid nodule in the right upper lobe image 35, measuring 5 mm. No other pulmonary nodules or masses. Mild upper lobe predominant emphysema. Hypoventilatory changes at the lung bases. No large effusion or pneumothorax. Upper Abdomen: Please refer to recent abdominal CT describing numerous hepatic metastases as well as omental caking and trace ascites. Musculoskeletal: There are no acute or destructive bony lesions. Reconstructed images demonstrate no additional findings. IMPRESSION: 1. Nonspecific sub solid 5 mm right upper lobe pulmonary nodule. Neoplasm cannot be excluded in light of findings elsewhere within the chest and abdomen. 2. Extensive mediastinal lymphadenopathy worrisome for nodal metastases. 3. Aortic Atherosclerosis (ICD10-I70.0) and Emphysema (ICD10-J43.9). 4. Please refer to recent abdominal CT describing diffuse hepatic metastases. Electronically Signed   By: Randa Ngo M.D.   On: 10/30/2019 21:56   MR BRAIN W WO CONTRAST  Result Date: 10/31/2019 CLINICAL DATA:  Weakness, metastases EXAM: MRI HEAD WITHOUT AND WITH CONTRAST TECHNIQUE: Multiplanar, multiecho pulse sequences of the brain and surrounding structures were obtained without  and with intravenous contrast. CONTRAST:  76mL GADAVIST GADOBUTROL 1 MMOL/ML IV SOLN COMPARISON:  09/30/2018 FINDINGS: Motion artifact is present Brain: There is no acute infarction or intracranial hemorrhage. There is no intracranial mass, mass effect, or edema. There is no hydrocephalus or extra-axial fluid  collection. Ventricles and sulci are stable in size and configuration. Patchy T2 hyperintensity in the supratentorial and pontine white matter is nonspecific but probably reflects mild chronic microvascular ischemic changes. No abnormal enhancement. Vascular: Major vessel flow voids at the skull base are preserved. Skull and upper cervical spine: Normal marrow signal is preserved. There is susceptibility artifact related to partially imaged cervical spine fusion. Sinuses/Orbits: Paranasal sinuses are aerated. Orbits are unremarkable. Other: Sella is unremarkable.  Mastoid air cells are clear. IMPRESSION: Suboptimal evaluation due to motion artifact. No evidence of intracranial metastatic disease. No acute infarction or hemorrhage. Electronically Signed   By: Macy Mis M.D.   On: 10/31/2019 14:24   CT ABDOMEN PELVIS W CONTRAST  Result Date: 10/29/2019 CLINICAL DATA:  Hyperkalemia,, liver disease, emesis, weight loss EXAM: CT ABDOMEN AND PELVIS WITH CONTRAST TECHNIQUE: Multidetector CT imaging of the abdomen and pelvis was performed using the standard protocol following bolus administration of intravenous contrast. CONTRAST:  60mL OMNIPAQUE IOHEXOL 300 MG/ML  SOLN COMPARISON:  06/17/2019, 01/05/2006 FINDINGS: Lower chest: No acute pleural or parenchymal lung disease. Hepatobiliary: There are innumerable masses replacing the majority of the liver parenchyma consistent with diffuse metastatic disease. The gallbladder is surgically absent. Pancreas: Unremarkable. No pancreatic ductal dilatation or surrounding inflammatory changes. Spleen: Normal in size without focal abnormality. Adrenals/Urinary Tract: Adrenal glands are unremarkable. Kidneys are normal, without renal calculi, focal lesion, or hydronephrosis. Bladder is unremarkable. Stomach/Bowel: No bowel obstruction or ileus. No bowel wall thickening or inflammatory changes. Vascular/Lymphatic: There is pathologic adenopathy within the retroperitoneum.  Largest pathologic lymph node in the left external iliac chain reference image 77 measures 3.5 x 3.1 cm. A second index lymph node at the right iliac bifurcation measures 2.6 x 2.2 cm reference image 62. There is minimal atherosclerosis of the aorta. Reproductive: Uterus is surgically absent. I do not see any adnexal lesions. Other: There is soft tissue fat stranding throughout the ventral omentum consistent with omental caking. Complex cystic mass within the left lower quadrant along the anterior abdominal wall measuring 5.7 x 7.2 cm is most consistent with a necrotic mass. Mural thickening along the anterior and lateral margin of the mass. There is trace free fluid within the upper abdomen surrounding the liver and spleen. No free intra-abdominal gas. Musculoskeletal: There are no acute or destructive bony lesions. Reconstructed images demonstrate no additional findings. IMPRESSION: 1. Diffuse hepatic metastases. 2. Omental caking. Complex cystic mass left lower quadrant consistent with necrotic lesion. 3. Pathologic retroperitoneal lymphadenopathy, most pronounced within the iliac distributions left greater than right. 4. Trace ascites. Electronically Signed   By: Randa Ngo M.D.   On: 10/29/2019 22:15   US BIOPSY (LIVER)  Result Date: 10/31/2019 INDICATION: 74 year old female with multifocal hepatic lesions concerning for metastatic disease. She presents for ultrasound-guided biopsy of the same. EXAM: ULTRASOUND BIOPSY CORE LIVER MEDICATIONS: None. ANESTHESIA/SEDATION: Moderate (conscious) sedation was employed during this procedure. A total of Versed 1 mg and Fentanyl 50 mcg was administered intravenously. Moderate Sedation Time: 13 minutes. The patient's level of consciousness and vital signs were monitored continuously by radiology nursing throughout the procedure under my direct supervision. FLUOROSCOPY TIME:  None. COMPLICATIONS: None immediate. PROCEDURE: Informed written consent was obtained from  the  patient after a thorough discussion of the procedural risks, benefits and alternatives. All questions were addressed. A timeout was performed prior to the initiation of the procedure. The liver was interrogated with ultrasound. There are innumerable hypoechoic solid lesions scattered throughout the liver. A suitable lesion in the left hepatic lobe was identified. The overlying skin was sterilely prepped and draped in the standard fashion. Local anesthesia was attained by infiltration with 1% lidocaine. A small dermatotomy was made. Under real-time ultrasound guidance, multiple 18 gauge core biopsies were obtained using the bio Pince automated biopsy device. Biopsies were performed coaxially through a 17 gauge introducer needle. As the introducer needle was withdrawn, the biopsy tract was embolized with a Gel-Foam slurry. Post ultrasound imaging demonstrates no evidence of immediate complication. IMPRESSION: Technically successful ultrasound-guided core biopsy of liver lesion. Electronically Signed   By: Jacqulynn Cadet M.D.   On: 10/31/2019 12:09    ASSESSMENT AND PLAN: 1.  Hepatic metastases, omental caking, and mediastinal/retroperitoneal lymphadenopathy concerning for underlying malignancy 2.  Elevated total bilirubin and LFTs 3.  AKI secondary to dehydration 4.  Hypercalcemia 5.  Deconditioning 6.  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  Diabetes mellitus 8.  Temporal arteritis, status post treatment 9.  Migraine headaches 10.  Leg weakness-steroid myopathy?,  Paraneoplastic syndrome?  [name] appears stable.  Abdominal pain improved today.  CA 27.29 is significantly elevated at 6728, CEA 4.8, CA 19.9 elevated at 441, and AFP normal. Biopsy of one of her liver lesions was performed on 10/31/2019 and we are awaiting pathology results.  CT of the chest without contrast performed 10/30/2019 showed extensive mediastinal  lymphadenopathy concerning for nodal metastases.  Alkaline phosphatase remains significantly elevated and total bilirubin also remains elevated high yesterday's labs.  Ammonia level elevated today. Renal function improving.  Much more somnolent today.  Unclear if due to elevated ammonia level, but also note that she had an elevated corrected calcium on yesterday's lab work.  Recommendations: 1.  Await results of liver biopsy.  Discussed with family members that we will communicate this information to them as soon as we have it. 2.  We have previously left a message for Dr. Watt Climes who indicates that he will review her case later today or tomorrow. 3.  We will order stat CMET to reevaluate her hypercalcemia.  Discussed with hospitalist that we will need to increase IV fluids, give diuretics, and possibly bisphosphonate.   LOS: 4 days   Patricia Bussing, DNP, AGPCNP-BC, AOCNP 11/03/19 [name] underwent a liver biopsy Friday.  The pathology is pending.  I called pathology and there computer system is down today.  I have been unable to locate the reading pathologist.  She has hypercalcemia, now symptomatic.  We will recommend increasing IV fluids.  We will add calcitonin and a biphosphonate.  She indicated that she does wish to proceed with systemic therapy when a diagnosis is confirmed.

## 2019-11-04 DIAGNOSIS — C779 Secondary and unspecified malignant neoplasm of lymph node, unspecified: Secondary | ICD-10-CM

## 2019-11-04 LAB — CBC WITH DIFFERENTIAL/PLATELET
Abs Immature Granulocytes: 0.42 10*3/uL — ABNORMAL HIGH (ref 0.00–0.07)
Basophils Absolute: 0.1 10*3/uL (ref 0.0–0.1)
Basophils Relative: 1 %
Eosinophils Absolute: 0 10*3/uL (ref 0.0–0.5)
Eosinophils Relative: 0 %
HCT: 36.7 % (ref 36.0–46.0)
Hemoglobin: 11.4 g/dL — ABNORMAL LOW (ref 12.0–15.0)
Immature Granulocytes: 3 %
Lymphocytes Relative: 9 %
Lymphs Abs: 1.3 10*3/uL (ref 0.7–4.0)
MCH: 30.4 pg (ref 26.0–34.0)
MCHC: 31.1 g/dL (ref 30.0–36.0)
MCV: 97.9 fL (ref 80.0–100.0)
Monocytes Absolute: 1.6 10*3/uL — ABNORMAL HIGH (ref 0.1–1.0)
Monocytes Relative: 11 %
Neutro Abs: 11.6 10*3/uL — ABNORMAL HIGH (ref 1.7–7.7)
Neutrophils Relative %: 76 %
Platelets: 307 10*3/uL (ref 150–400)
RBC: 3.75 MIL/uL — ABNORMAL LOW (ref 3.87–5.11)
RDW: 25.2 % — ABNORMAL HIGH (ref 11.5–15.5)
WBC: 15.1 10*3/uL — ABNORMAL HIGH (ref 4.0–10.5)
nRBC: 0.2 % (ref 0.0–0.2)

## 2019-11-04 LAB — COMPREHENSIVE METABOLIC PANEL
ALT: 43 U/L (ref 0–44)
AST: 212 U/L — ABNORMAL HIGH (ref 15–41)
Albumin: 1.6 g/dL — ABNORMAL LOW (ref 3.5–5.0)
Alkaline Phosphatase: 1396 U/L — ABNORMAL HIGH (ref 38–126)
Anion gap: 11 (ref 5–15)
BUN: 14 mg/dL (ref 8–23)
CO2: 21 mmol/L — ABNORMAL LOW (ref 22–32)
Calcium: 10.1 mg/dL (ref 8.9–10.3)
Chloride: 107 mmol/L (ref 98–111)
Creatinine, Ser: 0.66 mg/dL (ref 0.44–1.00)
GFR calc Af Amer: >60 mL/min (ref >60)
GFR calc non Af Amer: >60 mL/min (ref >60)
Glucose, Bld: 97 mg/dL (ref 70–99)
Potassium: 4.1 mmol/L (ref 3.5–5.1)
Sodium: 139 mmol/L (ref 135–145)
Total Bilirubin: 2.5 mg/dL — ABNORMAL HIGH (ref 0.3–1.2)
Total Protein: 5.3 g/dL — ABNORMAL LOW (ref 6.5–8.1)

## 2019-11-04 LAB — GLUCOSE, CAPILLARY
Glucose-Capillary: 87 mg/dL (ref 70–99)
Glucose-Capillary: 88 mg/dL (ref 70–99)
Glucose-Capillary: 109 mg/dL — ABNORMAL HIGH (ref 70–99)
Glucose-Capillary: 95 mg/dL (ref 70–99)

## 2019-11-04 MED ORDER — FUROSEMIDE 10 MG/ML IJ SOLN
40.0000 mg | Freq: Once | INTRAMUSCULAR | Status: AC
  Administered 2019-11-04: 40 mg via INTRAVENOUS
  Filled 2019-11-04: qty 4

## 2019-11-04 MED ORDER — LACTULOSE 10 GM/15ML PO SOLN
10.0000 g | Freq: Three times a day (TID) | ORAL | Status: DC
  Administered 2019-11-04 – 2019-11-05 (×5): 10 g via ORAL
  Filled 2019-11-04 (×5): qty 15

## 2019-11-04 NOTE — Progress Notes (Signed)
Patient requested to be placed on CPAP at this time. RT placed patient on CPAP. Patient tolerating well at this time. No respiratory distress noted. RT will monitor as needed.

## 2019-11-04 NOTE — Progress Notes (Addendum)
HEMATOLOGY-ONCOLOGY PROGRESS NOTE  SUBJECTIVE: More awake and interactive today.  Husband is at the bedside.  States that she regularly small amount of Ensure.  Really not taking in much else.  PHYSICAL EXAMINATION:  Vitals:   11/04/19 0422 11/04/19 0914  BP: 115/69 123/74  Pulse: 89 89  Resp: 16 18  Temp: 98.2 F (36.8 C) 97.7 F (36.5 C)  SpO2: 98% 100%   Filed Weights   10/31/19 2142 11/01/19 2130 11/04/19 0422  Weight: 107.9 kg 107.9 kg 112.7 kg    Intake/Output from previous day: 05/17 0701 - 05/18 0700 In: 5793.5 [P.O.:1257; I.V.:4313.5] Out: 1800 [Urine:1800]  GENERAL: Resting, has difficulty staying awake ABDOMEN: Positive bowel sounds, abdomen soft, nontender.  Hepatomegaly noted. NEURO: alert & oriented x 3 with fluent speech  LABORATORY DATA:  I have reviewed the data as listed CMP Latest Ref Rng & Units 11/04/2019 11/03/2019 11/02/2019  Glucose 70 - 99 mg/dL 97 100(H) 106(H)  BUN 8 - 23 mg/dL _0 Creatinine 0.44 - 1.00 mg/dL 0.66 0.75 0.88  Sodium 135 - 145 mmol/L 139 138 140  Potassium 3.5 - 5.1 mmol/L 4.1 4.4 4.1  Chloride 98 - 111 mmol/L 107 108 108  CO2 22 - 32 mmol/L 21(L) 21(L) 22  Calcium 8.9 - 10.3 mg/dL 10.1 10.8(H) 11.0(H)  Total Protein 6.5 - 8.1 g/dL 5.3(L) 5.1(L) 5.3(L)  Total Bilirubin 0.3 - 1.2 mg/dL 2.5(H) 2.4(H) 2.5(H)  Alkaline Phos 38 - 126 U/L 1,396(H) 1,408(H) 1,583(H)  AST 15 - 41 U/L 212(H) 212(H) 226(H)  ALT 0 - 44 U/L 43 42 42    Lab Results  Component Value Date   WBC 15.1 (H) 11/04/2019   HGB 11.4 (L) 11/04/2019   HCT 36.7 11/04/2019   MCV 97.9 11/04/2019   PLT 307 11/04/2019   NEUTROABS 11.6 (H) 11/04/2019    CT CHEST WO CONTRAST  Result Date: 10/30/2019 CLINICAL DATA:  Metastatic malignancy EXAM: CT CHEST WITHOUT CONTRAST TECHNIQUE: Multidetector CT imaging of the chest was performed following the standard protocol without IV contrast. COMPARISON:  10/29/2019, 06/18/2019 FINDINGS: Cardiovascular: No pericardial  effusion. Normal caliber of the thoracic aorta. Minimal atherosclerosis. Mediastinum/Nodes: Numerous enlarged mediastinal lymph nodes are identified. Index lymph node in the right paratracheal region measures 21 x 21 mm, previous having measured 27 x 22 mm. Increased number of enlarged lymph nodes throughout the mediastinum compared to prior neck CT. The trachea, esophagus, and thyroid are unremarkable. Lungs/Pleura: There is a small spiculated sub solid nodule in the right upper lobe image 35, measuring 5 mm. No other pulmonary nodules or masses. Mild upper lobe predominant emphysema. Hypoventilatory changes at the lung bases. No large effusion or pneumothorax. Upper Abdomen: Please refer to recent abdominal CT describing numerous hepatic metastases as well as omental caking and trace ascites. Musculoskeletal: There are no acute or destructive bony lesions. Reconstructed images demonstrate no additional findings. IMPRESSION: 1. Nonspecific sub solid 5 mm right upper lobe pulmonary nodule. Neoplasm cannot be excluded in light of findings elsewhere within the chest and abdomen. 2. Extensive mediastinal lymphadenopathy worrisome for nodal metastases. 3. Aortic Atherosclerosis (ICD10-I70.0) and Emphysema (ICD10-J43.9). 4. Please refer to recent abdominal CT describing diffuse hepatic metastases. Electronically Signed   By: Randa Ngo M.D.   On: 10/30/2019 21:56   MR BRAIN W WO CONTRAST  Result Date: 10/31/2019 CLINICAL DATA:  Weakness, metastases EXAM: MRI HEAD WITHOUT AND WITH CONTRAST TECHNIQUE: Multiplanar, multiecho pulse sequences of the brain and surrounding structures were obtained without and  with intravenous contrast. CONTRAST:  66m GADAVIST GADOBUTROL 1 MMOL/ML IV SOLN COMPARISON:  09/30/2018 FINDINGS: Motion artifact is present Brain: There is no acute infarction or intracranial hemorrhage. There is no intracranial mass, mass effect, or edema. There is no hydrocephalus or extra-axial fluid  collection. Ventricles and sulci are stable in size and configuration. Patchy T2 hyperintensity in the supratentorial and pontine white matter is nonspecific but probably reflects mild chronic microvascular ischemic changes. No abnormal enhancement. Vascular: Major vessel flow voids at the skull base are preserved. Skull and upper cervical spine: Normal marrow signal is preserved. There is susceptibility artifact related to partially imaged cervical spine fusion. Sinuses/Orbits: Paranasal sinuses are aerated. Orbits are unremarkable. Other: Sella is unremarkable.  Mastoid air cells are clear. IMPRESSION: Suboptimal evaluation due to motion artifact. No evidence of intracranial metastatic disease. No acute infarction or hemorrhage. Electronically Signed   By: PMacy MisM.D.   On: 10/31/2019 14:24   CT ABDOMEN PELVIS W CONTRAST  Result Date: 10/29/2019 CLINICAL DATA:  Hyperkalemia,, liver disease, emesis, weight loss EXAM: CT ABDOMEN AND PELVIS WITH CONTRAST TECHNIQUE: Multidetector CT imaging of the abdomen and pelvis was performed using the standard protocol following bolus administration of intravenous contrast. CONTRAST:  732mOMNIPAQUE IOHEXOL 300 MG/ML  SOLN COMPARISON:  06/17/2019, 01/05/2006 FINDINGS: Lower chest: No acute pleural or parenchymal lung disease. Hepatobiliary: There are innumerable masses replacing the majority of the liver parenchyma consistent with diffuse metastatic disease. The gallbladder is surgically absent. Pancreas: Unremarkable. No pancreatic ductal dilatation or surrounding inflammatory changes. Spleen: Normal in size without focal abnormality. Adrenals/Urinary Tract: Adrenal glands are unremarkable. Kidneys are normal, without renal calculi, focal lesion, or hydronephrosis. Bladder is unremarkable. Stomach/Bowel: No bowel obstruction or ileus. No bowel wall thickening or inflammatory changes. Vascular/Lymphatic: There is pathologic adenopathy within the retroperitoneum.  Largest pathologic lymph node in the left external iliac chain reference image 77 measures 3.5 x 3.1 cm. A second index lymph node at the right iliac bifurcation measures 2.6 x 2.2 cm reference image 62. There is minimal atherosclerosis of the aorta. Reproductive: Uterus is surgically absent. I do not see any adnexal lesions. Other: There is soft tissue fat stranding throughout the ventral omentum consistent with omental caking. Complex cystic mass within the left lower quadrant along the anterior abdominal wall measuring 5.7 x 7.2 cm is most consistent with a necrotic mass. Mural thickening along the anterior and lateral margin of the mass. There is trace free fluid within the upper abdomen surrounding the liver and spleen. No free intra-abdominal gas. Musculoskeletal: There are no acute or destructive bony lesions. Reconstructed images demonstrate no additional findings. IMPRESSION: 1. Diffuse hepatic metastases. 2. Omental caking. Complex cystic mass left lower quadrant consistent with necrotic lesion. 3. Pathologic retroperitoneal lymphadenopathy, most pronounced within the iliac distributions left greater than right. 4. Trace ascites. Electronically Signed   By: MiRanda Ngo.D.   On: 10/29/2019 22:15   [place]OPSY (LIVER)  Result Date: 10/31/2019 INDICATION: [age]ear old female with multifocal hepatic lesions concerning for metastatic disease. She presents for ultrasound-guided biopsy of the same. EXAM: ULTRASOUND BIOPSY CORE LIVER MEDICATIONS: None. ANESTHESIA/SEDATION: Moderate (conscious) sedation was employed during this procedure. A total of Versed 1 mg and Fentanyl 50 mcg was administered intravenously. Moderate Sedation Time: 13 minutes. The patient's level of consciousness and vital signs were monitored continuously by radiology nursing throughout the procedure under my direct supervision. FLUOROSCOPY TIME:  None. COMPLICATIONS: None immediate. PROCEDURE: Informed written consent was obtained from  the patient  after a thorough discussion of the procedural risks, benefits and alternatives. All questions were addressed. A timeout was performed prior to the initiation of the procedure. The liver was interrogated with ultrasound. There are innumerable hypoechoic solid lesions scattered throughout the liver. A suitable lesion in the left hepatic lobe was identified. The overlying skin was sterilely prepped and draped in the standard fashion. Local anesthesia was attained by infiltration with 1% lidocaine. A small dermatotomy was made. Under real-time ultrasound guidance, multiple 18 gauge core biopsies were obtained using the bio Pince automated biopsy device. Biopsies were performed coaxially through a 17 gauge introducer needle. As the introducer needle was withdrawn, the biopsy tract was embolized with a Gel-Foam slurry. Post ultrasound imaging demonstrates no evidence of immediate complication. IMPRESSION: Technically successful ultrasound-guided core biopsy of liver lesion. Electronically Signed   By: Jacqulynn Cadet M.D.   On: 10/31/2019 12:09    ASSESSMENT AND PLAN: 1.  Hepatic metastases, omental caking, and mediastinal/retroperitoneal lymphadenopathy concerning for underlying malignancy 2.  Elevated total bilirubin and LFTs 3.  AKI secondary to dehydration 4.  Hypercalcemia 5.  Deconditioning 6.  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  Diabetes mellitus 8.  Temporal arteritis, status post treatment 9.  Migraine headaches 10.  Leg weakness-steroid myopathy?,  Paraneoplastic syndrome?  [name] appears stable.  No reports of abdominal pain.  Still having intermittent nausea and vomiting.  CA 27.29 is significantly elevated at 6728, CEA 4.8, CA 19.9 elevated at 441, and AFP normal. Biopsy of one of her liver lesions was performed on 10/31/2019 and we are awaiting pathology results.  CT of the chest without contrast performed  10/30/2019 showed extensive mediastinal lymphadenopathy concerning for nodal metastases.  Alkaline phosphatase trending downward and total bilirubin stable. Noted to have hypercalcemia and was more somnolent yesterday.  Has received calcitonin, Zometa, and IV fluids with improvement of her somnolence.  Recommendations: 1.  Await results of liver biopsy.  Discussed with family members that we will communicate this information to them as soon as we have it.  Treatment options pending these results. 2.  Continue to follow calcium level closely.   LOS: 5 days   Mikey Bussing, DNP, AGPCNP-BC, AOCNP 11/04/19 Patricia. [name] was interviewed and examined.  She was more alert when I saw her this morning, but remains confused.  She received Zometa yesterday and will continue calcitonin for several more doses.  I discussed the case with the pathologist this afternoon.  The liver biopsy reveals metastatic carcinoma, cytokeratin 7, and ER positive.  The differential diagnosis includes breast cancer and a GYN malignancy.  I suspect she has breast cancer.  A breast prognostic profile is pending.  We will decide on systemic chemotherapy or hormonal therapy based on her performance status over the next few days and the HER-2 status.  Chemotherapy options will be limited by her performance status and liver function studies.  I will discuss the case with her family when the final pathology is available.

## 2019-11-04 NOTE — Progress Notes (Signed)
Daily Progress Note   Patient Name: [name]       Date: 11/04/2019 DOB: 05-25-1946  Age: 74 y.o. MRN#: [id number] Attending Physician: Patricia Merino, MD Primary Care Physician: Patricia Sake, MD Admit Date: 10/29/2019  Reason for Consultation/Follow-up: Psychosocial/spiritual support  Subjective: Patient was lying in bed and participated in a small amount of the conversation. She was awake and aware of topics being discussed with husband. Still with some bizarre speech and intermittent confusion. No acute concerns.  Length of Stay: 5  Current Medications: Scheduled Meds:  . calcitonin  400 Units Intramuscular BID  . citalopram  20 mg Oral Daily  . feeding supplement (ENSURE ENLIVE)  237 mL Oral TID BM  . fluticasone  1 spray Each Nare BID  . insulin aspart  0-15 Units Subcutaneous TID AC & HS  . lactulose  10 g Oral TID  . nystatin  5 mL Mouth/Throat QID  . nystatin   Topical TID  . pantoprazole  40 mg Oral Q supper    Continuous Infusions: . sodium chloride 75 mL/hr at 11/04/19 1505    PRN Meds: acetaminophen **OR** acetaminophen, ondansetron **OR** ondansetron (ZOFRAN) IV  Physical Exam Musculoskeletal:     Right lower leg: Edema (+2) present.     Left lower leg: Edema (+2) present.  Skin:    Comments: Flaking to bilateral LE   Neurological:     Mental Status: She is alert.             Vital Signs: BP 110/87 (BP Location: Right Arm)   Pulse (!) 101   Temp (!) 97.5 F (36.4 C) (Oral)   Resp 16   Ht 5\' 4"  (1.626 m)   Wt 112.7 kg   SpO2 98%   BMI 42.65 kg/m  SpO2: SpO2: 98 % O2 Device: O2 Device: Room Air O2 Flow Rate:    Intake/output summary:   Intake/Output Summary (Last 24 hours) at 11/04/2019 1734 Last data filed at 11/04/2019 1505 Gross per 24 hour    Intake 3569.06 ml  Output 3551 ml  Net 18.06 ml   LBM: Last BM Date: 11/03/19 Baseline Weight: Weight: 101.2 kg Most recent weight: Weight: 112.7 kg       Palliative Assessment/Data: PPS 20% d/t PO intake      Patient Active Problem List   Diagnosis Date Noted  . Palliative care by specialist   . Goals of care, counseling/discussion   . DNR (do not resuscitate)   . Metastatic cancer ([place]) 10/30/2019  . Hypercalcemia 10/29/2019  . AKI (acute kidney injury) ([place]) 10/29/2019  . Metastatic neoplastic disease ([place]) 10/29/2019  . Dehydration with hyponatremia 10/29/2019  . GERD without esophagitis 10/29/2019  . Polypharmacy   . Uncontrolled type 2 diabetes mellitus with hyperglycemia, with long-term current use of insulin ([place])   . Weakness 06/19/2019  . Cushingoid side effect of steroids ([place])   . Generalized weakness 06/17/2019  . Cellulitis of leg, left   . Elevated LFTs   . Mass in neck   . Cervical radiculopathy at C8 01/31/2019  . Right arm numbness 01/30/2019  . Chronic migraine 12/06/2017  . Chronic intractable headache 10/23/2017  . 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 Recently getting extremely debilitated and unable to walk since last 2 weeks. In the emergency room, hemodynamically stable. Clinically dehydrated. CT scan abdomen pelvis shows extensive hepatic metastatic disease, omental caking and complex cyst left lower quadrant, multiple lymphadenopathy.  Palliative care was asked to get involved in the setting of likely metastatic cancer.   Assessment: Went to follow up with patient at  bedside and [name]/husband was also at bedside assisting patient with eating.  Husband stated that patient seemed to be doing better today - was more awake and interactive. He stated that she had been able to drink some of her ensure, take small bites of ice cream, and eat part of a popsicle.  He stated that they were still waiting for the results of the liver biopsy. They want to receive those results before any decisions are made regarding next steps in the plan of care/GOC. The patient and husband want to understand all medical and treatment options and will make a decision accordingly. If there is a reasonable medical treatment, they wish to treat the treatable with oncology.   Discussed with patient/family the importance of continued conversation with family and the medical providers regarding overall plan of care and treatment options, ensuring decisions are within the context of the patient's values and GOCs.    We will continue to follow-up and provide any assistance with navigating treatment options as they arise with biopsy results and as patient and/or husband desires.    Questions and concerns were addressed. The family was encouraged to call with questions or concerns.    Recommendations/Plan:  Continue current medical treatment plan  Treat the treatable  Will continue to follow up and provide support to patient and family, as well as continue [place] discussion around results of liver biopsy  Goals of Care and Additional Recommendations:  Limitations on Scope of Treatment: Full Scope Treatment  Code Status:  DNR  Prognosis:   Unable to determine  Discharge Planning:  To Be Determined  Care plan was discussed with primary RN  Thank you for allowing the Palliative Medicine Team to assist in the care of this patient.   Total Time 25 minutes Prolonged Time Billed  no       Greater than 50%  of this time was spent counseling and coordinating care related to the  above assessment and plan.  Amber M. Tamala Julian MSN, FNP-BC   [place], DNP, [facility] Palliative Medicine Team Team Phone # [phone]  Pager [phone]

## 2019-11-04 NOTE — Progress Notes (Signed)
Occupational Therapy Treatment Patient Details Name: [name] MRN: [id number] DOB: 18-May-1946 Today's Date: 11/04/2019    History of present illness Pt is a 74 y/o female admitted secondary to abdominal distention and pain. 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 Pt likely for liver biopsy on 5/14. PMH includes DM, temporal arteritis, breast cancer, OSA on CPAP, R TKA, and ACDF.    OT comments  Patient supine in bed on arrival.  Today she seemed more confused than previous visit.  She was mumbling and only responsive ~25% of time, though was also agitated and yelling at husband who was not present.  Attempted to sit EOB to complete grooming or eating, though patient requiring mod assist to maintain seated balance and became too fatigued.  Bed mobility required max assist.  Patient needing increased assistance with ADLs today and only able to complete at bed level.  Will continue to follow with OT acutely to address the deficits listed below.    Follow Up Recommendations  SNF    Equipment Recommendations  Other (comment)(defer to next venue)    Recommendations for Other Services      Precautions / Restrictions Precautions Precautions: Fall Restrictions Weight Bearing Restrictions: No       Mobility Bed Mobility Overal bed mobility: Needs Assistance Bed Mobility: Supine to Sit;Sit to Supine     Supine to sit: Max assist Sit to supine: Max assist      Transfers                 General transfer comment: unable    Balance Overall balance assessment: Needs assistance Sitting-balance support: Bilateral upper extremity supported;Feet supported Sitting balance-[name]: Poor Sitting balance - Comments: Leaning R and posterioly, requiring min-mod assist to sit EOB and maintain balance.  Sat EOB 4 min                                   ADL either performed or assessed with clinical judgement   ADL Overall ADL's :  Needs assistance/impaired Eating/Feeding: Sitting;Moderate assistance   Grooming: Moderate assistance;Sitting                               Functional mobility during ADLs: Maximal assistance(with bed mob) General ADL Comments: Stayed EOB for safety due to patient's weakness and lethargy     Vision       Perception     Praxis      Cognition Arousal/Alertness: Lethargic Behavior During Therapy: Flat affect;Agitated Overall Cognitive Status: Impaired/Different from baseline Area of Impairment: Attention;Memory;Following commands;Awareness;Problem solving                   Current Attention Level: Focused Memory: Decreased short-term memory Following Commands: Follows one step commands inconsistently   Awareness: Emergent Problem Solving: Slow processing;Decreased initiation;Difficulty sequencing;Requires verbal cues;Requires tactile cues General Comments: Patient lethargic and seeming very confused today.  Yelling at her husband as if he was there who was not in the room.  [name] reponsive to commands. 25% of time        Exercises     Shoulder Instructions       General Comments Patient very weak and confused today limiting session    Pertinent Vitals/ Pain       Pain Assessment: Faces Faces Pain Wilson: Hurts little more Pain Location: general  discomfort, patient not able to state location of pain Pain Descriptors / Indicators: Grimacing;Discomfort Pain Intervention(s): Monitored during session;Limited activity within patient's tolerance  Home Living                                          Prior Functioning/Environment              Frequency  Min 2X/week        Progress Toward Goals  OT Goals(current goals can now be found in the care plan section)  Progress towards OT goals: Not progressing toward goals - comment(seems to be declining)  Acute Rehab OT Goals Patient Stated Goal: Did not state OT Goal  Formulation: With patient Time For Goal Achievement: 11/14/19 Potential to Achieve Goals: Good  Plan Discharge plan remains appropriate    Co-evaluation                 AM-PAC OT "6 Clicks" Daily Activity     Outcome Measure   Help from another person eating meals?: A Lot Help from another person taking care of personal grooming?: A Lot Help from another person toileting, which includes using toliet, bedpan, or urinal?: Total Help from another person bathing (including washing, rinsing, drying)?: Total Help from another person to put on and taking off regular upper body clothing?: A Lot Help from another person to put on and taking off regular lower body clothing?: Total 6 Click Score: 9    End of Session    OT Visit Diagnosis: Unsteadiness on feet (R26.81);Muscle weakness (generalized) (M62.81);Other symptoms and signs involving cognitive function   Activity Tolerance Patient limited by fatigue   Patient Left in bed;with call bell/phone within reach;with bed alarm set   Nurse Communication Mobility status        Time: [id number] OT Time Calculation (min): 26 min  Charges: OT General Charges $OT Visit: 1 Visit OT Treatments $Self Care/Home Management : 23-37 mins  August Luz, OTR/L    Phylliss Bob 11/04/2019, 12:31 PM

## 2019-11-04 NOTE — Plan of Care (Signed)
°  Problem: Activity: °Goal: Risk for activity intolerance will decrease °Outcome: Progressing °  °Problem: Nutrition: °Goal: Adequate nutrition will be maintained °Outcome: Progressing °  °Problem: Skin Integrity: °Goal: Risk for impaired skin integrity will decrease °Outcome: Progressing °  °

## 2019-11-04 NOTE — Progress Notes (Signed)
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 Recently getting extremely debilitated and unable to walk since last 2 weeks.  In the emergency room, hemodynamically stable.  Clinically dehydrated.  CT scan abdomen pelvis shows extensive hepatic metastatic disease, omental caking and complex cyst left lower quadrant, multiple lymphadenopathy. 5/14: US guided liver biopsy by IR. Results pending.   Assessment & Plan:   Principal Problem:   Metastatic neoplastic disease ([place]) Active Problems:   Uncontrolled type 2 diabetes mellitus with hyperglycemia, with long-term current use of insulin ([facility])   Hypercalcemia   AKI (acute kidney injury) ([place])   Dehydration with hyponatremia   GERD without esophagitis   Metastatic cancer ([place])   Palliative care by specialist   Goals of care, counseling/discussion   DNR (do not resuscitate)  Suspected metastatic disease: Primary source unknown.  Suspected primary GI source. Also has significant dysphagia.  Status post ultrasound-guided liver biopsy 5/14.  Results pending.  Symptomatic treatment now. CT scan of the chest consistent with diffuse lymphadenopathy. MRI of the brain with no evidence of meningeal disease, parenchymal metastasis. She does have severe comorbidities, now with advanced debility. Decrease dose of Celexa to 20 mg daily, adjusted for age. Her cancer diagnosis is pending, however patient has severe comorbidities and now with advanced physical debility.   Patient remains debilitated, working with PT OT.  Tentative plan is to go to a skilled nursing facility for  inpatient therapies if she is able to tolerate therapies. If her cancer diagnosis becomes with poor prognosis, she may even be a candidate for home hospice.  Acute kidney injury: With ongoing poor appetite and intake.  Renal function improved.  No adequate oral intake.  Keep on maintenance IV fluids.  Now on increased dose of IV fluids for hypercalcemia.  Hypercalcemia of malignancy: Treated with increasing IV fluids, will treat with Lasix to avoid fluid overload.  Patient received Zometa and currently on calcitonin.  We will continue close monitoring.  Type 2 diabetes uncontrolled with hyperglycemia: Currently with poor appetite and n.p.o. A1c 4.8.  Keep on sliding scale insulin until normal appetite.  Dehydration with hyponatremia: Treated with isotonic fluid with improvement.  Continue.   GERD without esophagitis: On Protonix at home.  Continue protonix. Changed to oral.  No oropharyngeal candidiasis visible.  On nystatin swish and swallow.   GI reported EGD and colonoscopy within 12 months with no obvious findings.  Continue Protonix.  Extreme physical debility and deconditioning: Continue to work with PT OT.  Will probably benefit with rehab.  Also depends upon whether her cancer is treatable or not.  Lactic acidosis: Due to liver function abnormality.  No evidence of active infection.  Lethargy/somnolence: Probably due to ongoing medical issues. She does have history of sleep apnea, will use CPAP.  CO2 is normal.  Morbid obesity:Body mass index is 42.65 kg/m.   DVT prophylaxis: SCDs Code Status: Full code Family Communication: Husband and daughter at bedside, 5/17. Disposition Plan: Status is: Inpatient.  The patient will require care spanning > 2 midnights and should be moved to inpatient because: Persistent severe electrolyte disturbances, Ongoing diagnostic testing needed not appropriate for outpatient work  up and Inpatient level of care appropriate due to severity of illness   Dispo: The patient is from: Home              Anticipated d/c is to: SNF              Anticipated d/c date is: 2 days              Patient currently is not medically stable to d/c.  Patient with extreme physical debility, new diagnosis of cancer and needing inpatient investigations and treatment.     Consultants:   Oncology  Interventional radiology  Palliative medicine  Procedures:   Ultrasound-guided liver biopsy, 5/14.  Antimicrobials:   None   Subjective: Seen and examined.  No overnight events.  Still tired and sleepy, however today she was able to open her eyes and keep of some conversation.  Had 3 bowel movements after increased dose of lactulose.  Abdomen remains distended and she feels some generalized pain.  Objective: Vitals:   11/03/19 2102 11/03/19 2118 11/04/19 0422 11/04/19 0914  BP:  118/64 115/69 123/74  Pulse: (!) 105 (!) 106 89 89  Resp: 18 18 16 18   Temp:  (!) 97.5 F (36.4 C) 98.2 F (36.8 C) 97.7 F (36.5 C)  TempSrc:  Oral Oral Oral  SpO2: 94% 99% 98% 100%  Weight:   112.7 kg   Height:        Intake/Output Summary (Last 24 hours) at 11/04/2019 1137 Last data filed at 11/04/2019 0855 Gross per 24 hour  Intake 3853.12 ml  Output 1800 ml  Net 2053.12 ml   Filed Weights   10/31/19 2142 11/01/19 2130 11/04/19 0422  Weight: 107.9 kg 107.9 kg 112.7 kg    Examination:  General exam: Appears calm and comfortable, chronically sick looking and lethargic and frail. Respiratory system: Clear to auscultation. Respiratory effort normal. Cardiovascular system: S1 & S2 heard, RRR. No JVD, murmurs, rubs, gallops or clicks. No pedal edema. Gastrointestinal system: Abdomen is distended.  Palpable nodular liver.  Obese and pendulous.  No localized tenderness rigidity or guarding. Central nervous system: Alert and oriented but very tired and sleepy. No focal neurological deficits. Extremities: Symmetric 5 x 5 power. Skin: No rashes, lesions or ulcers  Psychiatry: Judgement and insight appear normal. Mood & affect anxious.    Data Reviewed: I have personally reviewed following labs and imaging studies  CBC: Recent Labs  Lab 10/30/19 0426 10/30/19 0426 10/31/19 0455 11/01/19 0922 11/02/19 0339 11/03/19 0448 11/04/19 0604  WBC 12.5*  --  12.0*  --  13.9* 13.8* 15.1*  NEUTROABS 10.5*  --  8.6*  --  10.1* 10.1* 11.6*  HGB 11.1*   < > 10.5* 10.6* 10.8* 11.2* 11.4*  HCT 35.4*   < > 34.3* 34.3* 35.4* 36.8 36.7  MCV 92.9  --  95.5  --  98.6 98.1 97.9  PLT 420*  --  337  --  318 322 307   < > = values in this interval not displayed.   Basic Metabolic Panel: Recent Labs  Lab 10/30/19 0426 10/30/19 0426 10/31/19 0455 11/01/19 0424 11/02/19 0339 11/03/19 1512 11/04/19 0604  NA 135   < > 138 139 140 138 139  K 3.6   < > 4.0 4.2 4.1 4.4 4.1  CL 100   < > 103 107 108 108 107  CO2 23   < > 23 21* 22 21* 21*  GLUCOSE 82   < > 85 87 106*  100* 97  BUN 24*   < > 19 19 17 15 14   CREATININE 1.26*   < > 0.96 1.03* 0.88 0.75 0.66  CALCIUM 10.3   < > 10.3 10.6* 11.0* 10.8* 10.1  MG 1.9  --  1.8  --   --   --   --   PHOS  --   --  2.8  --   --   --   --    < > = values in this interval not displayed.   GFR: Estimated Creatinine Clearance: 78.2 mL/min (by C-G formula based on SCr of 0.66 mg/dL). Liver Function Tests: Recent Labs  Lab 10/31/19 0455 11/01/19 0424 11/02/19 0339 11/03/19 1512 11/04/19 0604  AST 239* 249* 226* 212* 212*  ALT 42 45* 42 42 43  ALKPHOS 924* 1,484* 1,583* 1,408* 1,396*  BILITOT 2.7* 3.9* 2.5* 2.4* 2.5*  PROT 5.4* 5.6* 5.3* 5.1* 5.3*  ALBUMIN 1.7* 1.8* 1.7* 1.6* 1.6*   Recent Labs  Lab 10/29/19 1857  LIPASE 33   Recent Labs  Lab 11/01/19 0922 11/02/19 0339 11/03/19 0448  AMMONIA 61* 76* 86*   Coagulation Profile: Recent Labs  Lab 10/30/19 0015 11/02/19 0339  INR 1.1 1.1   Cardiac Enzymes: No results for input(s): CKTOTAL, CKMB, CKMBINDEX, TROPONINI in the last 168 hours. BNP (last 3  results) No results for input(s): PROBNP in the last 8760 hours. HbA1C: No results for input(s): HGBA1C in the last 72 hours. CBG: Recent Labs  Lab 11/03/19 1108 11/03/19 1614 11/03/19 2116 11/04/19 0632 11/04/19 1116  GLUCAP 106* 96 107* 87 88   Lipid Profile: No results for input(s): CHOL, HDL, LDLCALC, TRIG, CHOLHDL, LDLDIRECT in the last 72 hours. Thyroid Function Tests: No results for input(s): TSH, T4TOTAL, FREET4, T3FREE, THYROIDAB in the last 72 hours. Anemia Panel: No results for input(s): VITAMINB12, FOLATE, FERRITIN, TIBC, IRON, RETICCTPCT in the last 72 hours. Sepsis Labs: Recent Labs  Lab 10/30/19 0015 10/30/19 0427  LATICACIDVEN 3.1* 2.6*    Recent Results (from the past 240 hour(s))  SARS Coronavirus 2 by RT PCR (hospital order, performed in [facility] hospital lab) Nasopharyngeal Nasopharyngeal Swab     Status: None   Collection Time: 10/29/19 10:49 PM   Specimen: Nasopharyngeal Swab  Result Value Ref Range Status   SARS Coronavirus 2 NEGATIVE NEGATIVE Final    Comment: (NOTE) SARS-CoV-2 target nucleic acids are NOT DETECTED. The SARS-CoV-2 RNA is generally detectable in upper and lower respiratory specimens during the acute phase of infection. The lowest concentration of SARS-CoV-2 viral copies this assay can detect is 250 copies / mL. A negative result does not preclude SARS-CoV-2 infection and should not be used as the sole basis for treatment or other patient management decisions.  A negative result may occur with improper specimen collection / handling, submission of specimen other than nasopharyngeal swab, presence of viral mutation(s) within the areas targeted by this assay, and inadequate number of viral copies (<250 copies / mL). A negative result must be combined with clinical observations, patient history, and epidemiological information. Fact Sheet for Patients:   StrictlyIdeas.no Fact Sheet for Healthcare  Providers: BankingDealers.co.za This test is not yet approved or cleared  by the [place] FDA and has been authorized for detection and/or diagnosis of SARS-CoV-2 by FDA under an Emergency Use Authorization (EUA).  This EUA will remain in effect (meaning this test can be used) for the duration of the COVID-19 declaration under Section 564(b)(1) of the Act, 21 U.S.C. section  360bbb-3(b)(1), unless the authorization is terminated or revoked sooner. Performed at [facility], [place] [address], [place], [place] [address]          Radiology Studies: No results found.      Scheduled Meds: . calcitonin  400 Units Intramuscular BID  . citalopram  20 mg Oral Daily  . feeding supplement (ENSURE ENLIVE)  237 mL Oral TID BM  . fluticasone  1 spray Each Nare BID  . furosemide  40 mg Intravenous Once  . insulin aspart  0-15 Units Subcutaneous TID AC & HS  . lactulose  10 g Oral TID  . nystatin  5 mL Mouth/Throat QID  . nystatin   Topical TID  . pantoprazole  40 mg Oral Q supper   Continuous Infusions: . sodium chloride 75 mL/hr at 11/04/19 1015     LOS: 5 days    Time spent: 25 minutes    Barb Merino, MD [facility] Pager [phone]

## 2019-11-04 NOTE — Progress Notes (Signed)
As RN and incoming RN were rounding, family members were at bedside with the patient had vomited ground coffee colored vomitus. Patient had Ensure chocolate flavored as per husband. Nurse aide was at bedside to do bedside care. Patient had the same episode as of yesterday of vomiting after being fed for dinner. Family was concerned of patient not getting enough calories. Reassured family that the medical staff are taking care of the patient and keeping track of her input and output.

## 2019-11-05 ENCOUNTER — Inpatient Hospital Stay (HOSPITAL_COMMUNITY): Payer: Medicare Other

## 2019-11-05 LAB — CBC WITH DIFFERENTIAL/PLATELET
Abs Immature Granulocytes: 0.35 10*3/uL — ABNORMAL HIGH (ref 0.00–0.07)
Basophils Absolute: 0.1 10*3/uL (ref 0.0–0.1)
Basophils Relative: 1 %
Eosinophils Absolute: 0 10*3/uL (ref 0.0–0.5)
Eosinophils Relative: 0 %
HCT: 41.4 % (ref 36.0–46.0)
Hemoglobin: 12.7 g/dL (ref 12.0–15.0)
Immature Granulocytes: 2 %
Lymphocytes Relative: 7 %
Lymphs Abs: 1.1 10*3/uL (ref 0.7–4.0)
MCH: 30.2 pg (ref 26.0–34.0)
MCHC: 30.7 g/dL (ref 30.0–36.0)
MCV: 98.3 fL (ref 80.0–100.0)
Monocytes Absolute: 1.6 10*3/uL — ABNORMAL HIGH (ref 0.1–1.0)
Monocytes Relative: 11 %
Neutro Abs: 12 10*3/uL — ABNORMAL HIGH (ref 1.7–7.7)
Neutrophils Relative %: 79 %
Platelets: 279 10*3/uL (ref 150–400)
RBC: 4.21 MIL/uL (ref 3.87–5.11)
RDW: 26.5 % — ABNORMAL HIGH (ref 11.5–15.5)
WBC: 15.1 10*3/uL — ABNORMAL HIGH (ref 4.0–10.5)
nRBC: 0.4 % — ABNORMAL HIGH (ref 0.0–0.2)

## 2019-11-05 LAB — AMMONIA: Ammonia: 52 umol/L — ABNORMAL HIGH (ref 9–35)

## 2019-11-05 LAB — COMPREHENSIVE METABOLIC PANEL
ALT: 51 U/L — ABNORMAL HIGH (ref 0–44)
AST: 251 U/L — ABNORMAL HIGH (ref 15–41)
Albumin: 1.7 g/dL — ABNORMAL LOW (ref 3.5–5.0)
Alkaline Phosphatase: 1346 U/L — ABNORMAL HIGH (ref 38–126)
Anion gap: 12 (ref 5–15)
BUN: 14 mg/dL (ref 8–23)
CO2: 22 mmol/L (ref 22–32)
Calcium: 9.6 mg/dL (ref 8.9–10.3)
Chloride: 109 mmol/L (ref 98–111)
Creatinine, Ser: 0.73 mg/dL (ref 0.44–1.00)
GFR calc Af Amer: >60 mL/min (ref >60)
GFR calc non Af Amer: >60 mL/min (ref >60)
Glucose, Bld: 93 mg/dL (ref 70–99)
Potassium: 5.1 mmol/L (ref 3.5–5.1)
Sodium: 143 mmol/L (ref 135–145)
Total Bilirubin: 2.7 mg/dL — ABNORMAL HIGH (ref 0.3–1.2)
Total Protein: 5.5 g/dL — ABNORMAL LOW (ref 6.5–8.1)

## 2019-11-05 LAB — GLUCOSE, CAPILLARY
Glucose-Capillary: 106 mg/dL — ABNORMAL HIGH (ref 70–99)
Glucose-Capillary: 109 mg/dL — ABNORMAL HIGH (ref 70–99)
Glucose-Capillary: 114 mg/dL — ABNORMAL HIGH (ref 70–99)
Glucose-Capillary: 116 mg/dL — ABNORMAL HIGH (ref 70–99)
Glucose-Capillary: 91 mg/dL (ref 70–99)

## 2019-11-05 MED ORDER — INSULIN ASPART 100 UNIT/ML ~~LOC~~ SOLN: 0.0000 [IU] | SUBCUTANEOUS | Status: DC

## 2019-11-05 NOTE — Progress Notes (Signed)
   11/05/19 2131  Assess: MEWS Score  Temp (!) 97.4 F (36.3 C)  BP (!) 120/97  Pulse Rate (!) 121  Resp (!) 25  SpO2 94 %  Assess: MEWS Score  MEWS Temp 0  MEWS Systolic 0  MEWS Pulse 2  MEWS RR 1  MEWS LOC 0  MEWS Score 3  MEWS Score Color Yellow  Assess: if the MEWS score is Yellow or Red  Were vital signs taken at a resting state? Yes  Focused Assessment Documented focused assessment  Early Detection of Sepsis Score *See Row Information* Low  MEWS guidelines implemented *See Row Information* Yes  Treat  MEWS Interventions Other (Comment) (MD placed orders )  Take Vital Signs  Increase Vital Sign Frequency  Yellow: Q 2hr X 2 then Q 4hr X 2, if remains yellow, continue Q 4hrs  Escalate  MEWS: Escalate Yellow: discuss with charge nurse/RN and consider discussing with provider and RRT  Notify: Charge Nurse/RN  Name of Charge Nurse/RN Notified Charito B, RN  Date Charge Nurse/RN Notified 11/05/19  Time Charge Nurse/RN Notified 2137  Notify: Provider  Provider Name/Title T. Opyd, MD  Date Provider Notified 11/05/19  Time Provider Notified 2138  Notification Type Page  Notification Reason Change in status  Response See new orders (MD coming to see pt. )  Date of Provider Response 11/05/19  Time of Provider Response 2149  Notify: Rapid Response  Name of Rapid Response RN Notified  (MD notified, no need for Rapid Response)  Document  Patient Outcome Other (Comment) (MD placed orders, RN will continue to monitor pt. )  Progress note created (see row info) Yes

## 2019-11-05 NOTE — Progress Notes (Signed)
HEMATOLOGY-ONCOLOGY PROGRESS NOTE  SUBJECTIVE: [name] is alert.  No new complaint.  PHYSICAL EXAMINATION:  Vitals:   11/05/19 0551 11/05/19 0928  BP: 115/67 130/78  Pulse: 97 (!) 108  Resp: 16 18  Temp: (!) 97.4 F (36.3 C) 98.2 F (36.8 C)  SpO2: 100% 98%   Filed Weights   11/01/19 2130 11/04/19 0422 11/04/19 2104  Weight: 237 lb 14.1 oz (107.9 kg) 248 lb 7.3 oz (112.7 kg) 253 lb 12 oz (115.1 kg)    Intake/Output from previous day: 05/18 0701 - 05/19 0700 In: 2308.1 [P.O.:360; I.V.:1948.1] Out: 2551 [Urine:2551]  Lungs: Clear anteriorly Cardiac: Regular rate and rhythm ABDOMEN: Positive bowel sounds, abdomen soft, nontender.  Hepatomegaly noted. NEURO: alert, oriented to place.  Not oriented to year or situation. Vascular: Pitting edema at the feet bilaterally  LABORATORY DATA:  I have reviewed the data as listed CMP Latest Ref Rng & Units 11/05/2019 11/04/2019 11/03/2019  Glucose 70 - 99 mg/dL 93 97 100(H)  BUN 8 - 23 mg/dL 14 14 15   Creatinine 0.44 - 1.00 mg/dL 0.73 0.66 0.75  Sodium 135 - 145 mmol/L 143 139 138  Potassium 3.5 - 5.1 mmol/L 5.1 4.1 4.4  Chloride 98 - 111 mmol/L 109 107 108  CO2 22 - 32 mmol/L 22 21(L) 21(L)  Calcium 8.9 - 10.3 mg/dL 9.6 10.1 10.8(H)  Total Protein 6.5 - 8.1 g/dL 5.5(L) 5.3(L) 5.1(L)  Total Bilirubin 0.3 - 1.2 mg/dL 2.7(H) 2.5(H) 2.4(H)  Alkaline Phos 38 - 126 U/L 1,346(H) 1,396(H) 1,408(H)  AST 15 - 41 U/L 251(H) 212(H) 212(H)  ALT 0 - 44 U/L 51(H) 43 42    Lab Results  Component Value Date   WBC 15.1 (H) 11/05/2019   HGB 12.7 11/05/2019   HCT 41.4 11/05/2019   MCV 98.3 11/05/2019   PLT 279 11/05/2019   NEUTROABS 12.0 (H) 11/05/2019    CT CHEST WO CONTRAST  Result Date: 10/30/2019 CLINICAL DATA:  Metastatic malignancy EXAM: CT CHEST WITHOUT CONTRAST TECHNIQUE: Multidetector CT imaging of the chest was performed following the standard protocol without IV contrast. COMPARISON:  10/29/2019, 06/18/2019 FINDINGS:  Cardiovascular: No pericardial effusion. Normal caliber of the thoracic aorta. Minimal atherosclerosis. Mediastinum/Nodes: Numerous enlarged mediastinal lymph nodes are identified. Index lymph node in the right paratracheal region measures 21 x 21 mm, previous having measured 27 x 22 mm. Increased number of enlarged lymph nodes throughout the mediastinum compared to prior neck CT. The trachea, esophagus, and thyroid are unremarkable. Lungs/Pleura: There is a small spiculated sub solid nodule in the right upper lobe image 35, measuring 5 mm. No other pulmonary nodules or masses. Mild upper lobe predominant emphysema. Hypoventilatory changes at the lung bases. No large effusion or pneumothorax. Upper Abdomen: Please refer to recent abdominal CT describing numerous hepatic metastases as well as omental caking and trace ascites. Musculoskeletal: There are no acute or destructive bony lesions. Reconstructed images demonstrate no additional findings. IMPRESSION: 1. Nonspecific sub solid 5 mm right upper lobe pulmonary nodule. Neoplasm cannot be excluded in light of findings elsewhere within the chest and abdomen. 2. Extensive mediastinal lymphadenopathy worrisome for nodal metastases. 3. Aortic Atherosclerosis (ICD10-I70.0) and Emphysema (ICD10-J43.9). 4. Please refer to recent abdominal CT describing diffuse hepatic metastases. Electronically Signed   By: Randa Ngo M.D.   On: 10/30/2019 21:56   MR BRAIN W WO CONTRAST  Result Date: 10/31/2019 CLINICAL DATA:  Weakness, metastases EXAM: MRI HEAD WITHOUT AND WITH CONTRAST TECHNIQUE: Multiplanar, multiecho pulse sequences of the brain and  surrounding structures were obtained without and with intravenous contrast. CONTRAST:  15mL GADAVIST GADOBUTROL 1 MMOL/ML IV SOLN COMPARISON:  09/30/2018 FINDINGS: Motion artifact is present Brain: There is no acute infarction or intracranial hemorrhage. There is no intracranial mass, mass effect, or edema. There is no hydrocephalus  or extra-axial fluid collection. Ventricles and sulci are stable in size and configuration. Patchy T2 hyperintensity in the supratentorial and pontine white matter is nonspecific but probably reflects mild chronic microvascular ischemic changes. No abnormal enhancement. Vascular: Major vessel flow voids at the skull base are preserved. Skull and upper cervical spine: Normal marrow signal is preserved. There is susceptibility artifact related to partially imaged cervical spine fusion. Sinuses/Orbits: Paranasal sinuses are aerated. Orbits are unremarkable. Other: Sella is unremarkable.  Mastoid air cells are clear. IMPRESSION: Suboptimal evaluation due to motion artifact. No evidence of intracranial metastatic disease. No acute infarction or hemorrhage. Electronically Signed   By: Macy Mis M.D.   On: 10/31/2019 14:24   CT ABDOMEN PELVIS W CONTRAST  Result Date: 10/29/2019 CLINICAL DATA:  Hyperkalemia,, liver disease, emesis, weight loss EXAM: CT ABDOMEN AND PELVIS WITH CONTRAST TECHNIQUE: Multidetector CT imaging of the abdomen and pelvis was performed using the standard protocol following bolus administration of intravenous contrast. CONTRAST:  74mL OMNIPAQUE IOHEXOL 300 MG/ML  SOLN COMPARISON:  06/17/2019, 01/05/2006 FINDINGS: Lower chest: No acute pleural or parenchymal lung disease. Hepatobiliary: There are innumerable masses replacing the majority of the liver parenchyma consistent with diffuse metastatic disease. The gallbladder is surgically absent. Pancreas: Unremarkable. No pancreatic ductal dilatation or surrounding inflammatory changes. Spleen: Normal in size without focal abnormality. Adrenals/Urinary Tract: Adrenal glands are unremarkable. Kidneys are normal, without renal calculi, focal lesion, or hydronephrosis. Bladder is unremarkable. Stomach/Bowel: No bowel obstruction or ileus. No bowel wall thickening or inflammatory changes. Vascular/Lymphatic: There is pathologic adenopathy within the  retroperitoneum. Largest pathologic lymph node in the left external iliac chain reference image 77 measures 3.5 x 3.1 cm. A second index lymph node at the right iliac bifurcation measures 2.6 x 2.2 cm reference image 62. There is minimal atherosclerosis of the aorta. Reproductive: Uterus is surgically absent. I do not see any adnexal lesions. Other: There is soft tissue fat stranding throughout the ventral omentum consistent with omental caking. Complex cystic mass within the left lower quadrant along the anterior abdominal wall measuring 5.7 x 7.2 cm is most consistent with a necrotic mass. Mural thickening along the anterior and lateral margin of the mass. There is trace free fluid within the upper abdomen surrounding the liver and spleen. No free intra-abdominal gas. Musculoskeletal: There are no acute or destructive bony lesions. Reconstructed images demonstrate no additional findings. IMPRESSION: 1. Diffuse hepatic metastases. 2. Omental caking. Complex cystic mass left lower quadrant consistent with necrotic lesion. 3. Pathologic retroperitoneal lymphadenopathy, most pronounced within the iliac distributions left greater than right. 4. Trace ascites. Electronically Signed   By: Randa Ngo M.D.   On: 10/29/2019 22:15   US BIOPSY (LIVER)  Result Date: 10/31/2019 INDICATION: 74 year old female with multifocal hepatic lesions concerning for metastatic disease. She presents for ultrasound-guided biopsy of the same. EXAM: ULTRASOUND BIOPSY CORE LIVER MEDICATIONS: None. ANESTHESIA/SEDATION: Moderate (conscious) sedation was employed during this procedure. A total of Versed 1 mg and Fentanyl 50 mcg was administered intravenously. Moderate Sedation Time: 13 minutes. The patient's level of consciousness and vital signs were monitored continuously by radiology nursing throughout the procedure under my direct supervision. FLUOROSCOPY TIME:  None. COMPLICATIONS: None immediate. PROCEDURE: Informed written consent  was obtained from the patient after a thorough discussion of the procedural risks, benefits and alternatives. All questions were addressed. A timeout was performed prior to the initiation of the procedure. The liver was interrogated with ultrasound. There are innumerable hypoechoic solid lesions scattered throughout the liver. A suitable lesion in the left hepatic lobe was identified. The overlying skin was sterilely prepped and draped in the standard fashion. Local anesthesia was attained by infiltration with 1% lidocaine. A small dermatotomy was made. Under real-time ultrasound guidance, multiple 18 gauge core biopsies were obtained using the bio Pince automated biopsy device. Biopsies were performed coaxially through a 17 gauge introducer needle. As the introducer needle was withdrawn, the biopsy tract was embolized with a Gel-Foam slurry. Post ultrasound imaging demonstrates no evidence of immediate complication. IMPRESSION: Technically successful ultrasound-guided core biopsy of liver lesion. Electronically Signed   By: Jacqulynn Cadet M.D.   On: 10/31/2019 12:09    ASSESSMENT AND PLAN: 1.  Hepatic metastases, omental caking, and mediastinal/retroperitoneal lymphadenopathy concerning for underlying malignancy 2.  Elevated total bilirubin and LFTs 3.  AKI secondary to dehydration 4.  Hypercalcemia 5.  Deconditioning 6.  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  Diabetes mellitus 8.  Temporal arteritis, status post treatment 9.  Migraine headaches 10.  Leg weakness-steroid myopathy?,  Paraneoplastic syndrome?  [name] is more alert, but continues to be confused.  She has a poor performance status.  The hypercalcemia has improved.  The confusion may be related to hepatic encephalopathy or hospital delirium.  The additional immunohistochemical stains from the liver biopsy remain pending.  I discussed the case with her husband  and daughter by telephone.  I explained the advanced nature of her cancer and treatment options.  She most likely has metastatic breast cancer.  We would recommend chemotherapy if her performance status were adequate.  However it does not appear her performance status is adequate to receive chemotherapy.  We will wait on the final results from the liver biopsy prior to deciding on a trial of chemotherapy versus hospice.     LOS: 6 days   Betsy Coder, MD 11/05/19

## 2019-11-05 NOTE — Progress Notes (Addendum)
PROGRESS NOTE    [name]  WLS:[id number] DOB: 04-22-1946 DOA: 10/29/2019 PCP: Leonides Sake, MD    Chief Complaint  Patient presents with  . Abdominal Pain  . Abnormal Lab    Brief Narrative:  75 white female DM TY 2 temporal arteritis reflux migraines OSA cervical radiculopathy polypharmacy with multiple psychotropics Recently admitted 06/17/2019--> 06/25/2019- found to have thrush weakness dysphagia and a neck mass but no source of mass on imaging Admit from ED 5/12 subacute abdominal pain waxing waning over 3 months more acutely 3 weeks ago developed worsening weakness poor appetite nausea PCP concerned about cancer-came to ED found to have hepatic metastases omental caking complex cystic left lower quadrant mass and retroperitoneal adenopathy also found to have hyponatremia hypocalcemia AKI Underwent liver biopsy by IR 5/14    Assessment & Plan:   Principal Problem:   Metastatic neoplastic disease ([place]) Active Problems:   Uncontrolled type 2 diabetes mellitus with hyperglycemia, with long-term current use of insulin (HCC)   Hypercalcemia   AKI (acute kidney injury) ([place])   Dehydration with hyponatremia   GERD without esophagitis   Metastatic cancer ([place])   Palliative care by specialist   Goals of care, counseling/discussion   DNR (do not resuscitate)   Probable metastatic stage IV breast cancer 1. Liver biopsy pending for definitive diagnosis- 2. Appreciate input from oncologist Dr. Malachy Mood who is working with family and palliative care to determine if based on ECOG performance status she would be a candidate for chemo Volume depletion, mild hyperkalemia 1. BUN/creatinine about the same as on admission 2. Continue saline 75 cc/H 3. Potassium creeping up to 5.1-specific meds that may cause this on MAR 4. Repeat labs a.m. Hypercalciumia 1. Received zoledronic acid earlier in admission and then nasal calcitonin 2. May need redosing of Zomig in 1 week on  5/25 Transaminitis 1. 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 Bilirubin is stable in the 2.5 range 3. Obtain a.m. INR Temporal arteritis 1. Holding any steroids at this time Metabolic encephalopathy 1. Probably secondary to decompensated either cirrhosis or metastases from liver as she is on lactulose and seems to be about the same versus slightly improved 2. Continue lactulose at this time-hold on adding rifaximin  Patient has an overall poor prognosis based on multiple factors inclusive of continued metabolic encephalopathy and inability to ambulate Appreciate both oncology and palliative medicine input with regards to disposition-therapy services are recommending skilled care although I do not know if she is going to be able to walk and hence with then be a candidate for hospice at home or freestanding facility based on family preference-we will reach out once pathology is finalized and oncology can give an opinion   DVT prophylaxis: Lovenox Code Status: DNR confirmed Family Communication: None at the bedside  disposition: Inpatient  Status is: Inpatient  Remains inpatient appropriate because:Altered mental status   Dispo: The patient is from: Home              Anticipated d/c is to: To be determined              Anticipated d/c date is: 2 days              Patient currently is not medically stable to d/c.        Consultants:   Oncology  Palliative medicine  Procedures: Liver biopsy 5/14  Antimicrobials: None  Subjective: Arousable but does not awaken fully Thick neck Mallampati 4 She seems somewhat confused but this seems to be the baseline over the past several days after talking with nursing staff  Objective: Vitals:   11/04/19 2010 11/04/19 2104 11/05/19 0551 11/05/19 0928  BP:  124/65 115/67 130/78  Pulse: 90 93 97 (!) 108  Resp: 16 15 16 18   Temp:  (!) 97.3 F  (36.3 C) (!) 97.4 F (36.3 C) 98.2 F (36.8 C)  TempSrc:  Oral Oral Oral  SpO2: 97% 100% 100% 98%  Weight:  115.1 kg    Height:        Intake/Output Summary (Last 24 hours) at 11/05/2019 1533 Last data filed at 11/05/2019 1300 Gross per 24 hour  Intake 1595.42 ml  Output 800 ml  Net 795.42 ml   Filed Weights   11/01/19 2130 11/04/19 0422 11/04/19 2104  Weight: 107.9 kg 112.7 kg 115.1 kg    Examination:  General exam: Thick neck Mallampati 4 no icterus no pallor neck soft supple no thyromegaly Respiratory system: Chest clear without added or adventitious sounds Cardiovascular system: S1-S2 no murmur rub or gallop no rales no rhonchi Gastrointestinal system: Soft nontender no rebound no hepatosplenomegaly palpable given her habitus. Central nervous system: Sleepy arousable but does not choose to awaken Power 5/5 reflexes deferred Extremities: No lower extremity edema Skin: None Psychiatry: Euthymic pleasant    Data Reviewed: I have personally reviewed following labs and imaging studies Potassium 5.1 up from 4.1 Alk phos 1396-->1346 Albumin 1.7 AST/ALT 212/43-->251/51 Ammonia 52 Bilirubin 2.7  Radiology Studies: No results found.    Scheduled Meds: . citalopram  20 mg Oral Daily  . feeding supplement (ENSURE ENLIVE)  237 mL Oral TID BM  . fluticasone  1 spray Each Nare BID  . insulin aspart  0-15 Units Subcutaneous TID AC & HS  . lactulose  10 g Oral TID  . nystatin  5 mL Mouth/Throat QID  . nystatin   Topical TID  . pantoprazole  40 mg Oral Q supper   Continuous Infusions: . sodium chloride 75 mL/hr at 11/04/19 1505     LOS: 6 days    Time spent: [place], MD [facility]   To contact the attending provider between 7A-7P or the covering provider during after hours 7P-7A, please log into the web site [email or web address] and access using universal [place] password for that web site. If you do not have the password, please call  the hospital operator.  11/05/2019, 3:33 PM

## 2019-11-05 NOTE — Progress Notes (Signed)
Patient has been vomiting and coughing today after eating, now with tachypnea and sinus tachycardia to 120s. She reports ongoing abdominal pain but no other complaints.   Plan to make NPO for now, consult SLP, check CXR.

## 2019-11-05 NOTE — Progress Notes (Signed)
Daily Progress Note   Patient Name: [name]       Date: 11/05/2019 DOB: 11-06-1945  Age: 74 y.o. MRN#: [id number] Attending Physician: Nita Sells, MD Primary Care Physician: Leonides Sake, MD Admit Date: 10/29/2019  Reason for Consultation/Follow-up: Establishing goals of care and Psychosocial/spiritual support  Subjective: Patient minimally responsive, family reports some vomiting. Spouse and daughter, [name], at bedside.   Length of Stay: 6  Current Medications: Scheduled Meds:  . calcitonin  400 Units Intramuscular BID  . citalopram  20 mg Oral Daily  . feeding supplement (ENSURE ENLIVE)  237 mL Oral TID BM  . fluticasone  1 spray Each Nare BID  . insulin aspart  0-15 Units Subcutaneous TID AC & HS  . lactulose  10 g Oral TID  . nystatin  5 mL Mouth/Throat QID  . nystatin   Topical TID  . pantoprazole  40 mg Oral Q supper    Continuous Infusions: . sodium chloride 75 mL/hr at 11/04/19 1505    PRN Meds: acetaminophen **OR** acetaminophen, ondansetron **OR** ondansetron (ZOFRAN) IV  Physical Exam Constitutional:      Comments: Minimally interactive  Pulmonary:     Effort: Pulmonary effort is normal. No respiratory distress.  Musculoskeletal:     Right lower leg: Right lower leg edema: +2.     Left lower leg: Left lower leg edema: +2.  Skin:    General: Skin is warm and dry.     Comments: Flaking to bilateral LE   Neurological:     Mental Status: She is disoriented.             Vital Signs: BP 130/78 (BP Location: Right Arm)   Pulse (!) 108   Temp 98.2 F (36.8 C) (Oral)   Resp 18   Ht 5\' 4"  (1.626 m)   Wt 115.1 kg   SpO2 98%   BMI 43.56 kg/m  SpO2: SpO2: 98 % O2 Device: O2 Device: Room Air O2 Flow Rate:    Intake/output summary:    Intake/Output Summary (Last 24 hours) at 11/05/2019 1524 Last data filed at 11/05/2019 1300 Gross per 24 hour  Intake 1595.42 ml  Output 800 ml  Net 795.42 ml   LBM: Last BM Date: 11/03/19 Baseline Weight: Weight: 101.2 kg Most recent weight: Weight: 115.1 kg       Palliative Assessment/Data: PPS 20% d/t PO intake    Flowsheet Rows     Most Recent Value  Intake Tab  Referral Department  Hospitalist  Unit at Time of Referral  Cardiac/Telemetry Unit  Palliative Care Primary Diagnosis  Cancer  Date Notified  11/01/19  Palliative Care Type  New Palliative care  Reason for referral  Clarify Goals of Care  Date of Admission  10/29/19  Date first seen by Palliative Care  11/02/19  # of days Palliative referral response time  1 Day(s)  # of days IP prior to Palliative referral  3  Clinical Assessment  Palliative Performance Scale Score  20%  Psychosocial & Spiritual Assessment  Palliative Care Outcomes  Patient/Family meeting held?  Yes  Who was at the meeting?  spouse  Palliative Care Outcomes  Clarified goals of care, Counseled regarding  hospice, Provided psychosocial or spiritual support, Changed CPR status      Patient Active Problem List   Diagnosis Date Noted  . Palliative care by specialist   . Goals of care, counseling/discussion   . DNR (do not resuscitate)   . Metastatic cancer ([place]) 10/30/2019  . Hypercalcemia 10/29/2019  . AKI (acute kidney injury) ([place]) 10/29/2019  . Metastatic neoplastic disease ([place]) 10/29/2019  . Dehydration with hyponatremia 10/29/2019  . GERD without esophagitis 10/29/2019  . Polypharmacy   . Uncontrolled type 2 diabetes mellitus with hyperglycemia, with long-term current use of insulin ([place])   . Weakness 06/19/2019  . Cushingoid side effect of steroids ([place])   . Generalized weakness 06/17/2019  . Cellulitis of leg, left   . Elevated LFTs   . Mass in neck   . Cervical radiculopathy at C8 01/31/2019  . Right arm numbness  01/30/2019  . Chronic migraine 12/06/2017  . Chronic intractable headache 10/23/2017  . Cervical myelopathy with cervical radiculopathy 02/15/2016    Palliative Care Assessment & Plan   HPI: Per intake H&P --> Patient is 74 year old female with history of type 2 diabetes on insulin, temporal arteritis recently treated with very high dose of steroids, GERD, migraine headaches, obstructive sleep apnea who presents to the emergency room with abnormal lab from her primary care physician's office. Recently getting extremely debilitated and unable to walk since last 2 weeks. In the emergency room, hemodynamically stable. Clinically dehydrated. CT scan abdomen pelvis shows extensive hepatic metastatic disease, omental caking and complex cyst left lower quadrant, multiple lymphadenopathy.  Palliative care was asked to get involved in the setting of likely metastatic cancer.   Assessment: Called by daughter [name] requesting further conversation.   Went to bedside to meet with patient, daughter, and spouse. Patient unable to participate in conversation. Family had just spoken via telephone to Dr. Benay Spice and report good understanding of situation.  They continue to be hopeful that [name] will be able to tolerate chemotherapy; however, they also report understanding that she may not have good treatment options d/t her poor functional status.   They ask about a feeding tube - we discuss pros of receiving nutrition and medications but also risks of pain, agitation, aspiration. We discuss waiting for treatment options - if we transition to more of a comfort approach, proceeding with feeding tube does not make sense. They agree as well.   We discuss how to proceed if it becomes more apparent we should focus on patient's comfort and avoid aggressive treatment options. We discuss hospice care at home vs hospice facility. Discuss philosophy of hospice care. Patient's spouse reports he knows the patient  would want to die at home but he is unsure if he could provide level of care she needs at home. It is most important to him that patient be able to see her pets. We discuss that pets could visit her at hospice facility if that becomes more appropriate. With permission from family, I discussed prognosis. Discussed that if she continues on current trajectory with very altered mental status and poor PO intake - less than two weeks would not be surprising.   They share that if patient continues to decline it would be most important for them to get her son into the hospital to see her and also her dogs.   Emotional support provided to family. All questions and concerns addressed. Discussed plan for palliative to follow up tomorrow.   Recommendations/Plan:  Await follow up from oncology about  treatment options  Hospice at home vs hospice facility discussed  Prognosis discussed  If patient declines further, most important to family that her son and pets be allowed to visit  PMT will follow up  Goals of Care and Additional Recommendations:  Limitations on Scope of Treatment: Full Scope Treatment  Code Status:  DNR  Prognosis:   Unable to determine - if continues on current trajectory with AMS and poor PO intake <2 weeks would not be surprising  Discharge Planning:  To Be Determined  Care plan was discussed with primary RN, daughter, spouse  Thank you for allowing the Palliative Medicine Team to assist in the care of this patient.   Total Time 35 minutes Prolonged Time Billed  no       Greater than 50%  of this time was spent counseling and coordinating care related to the above assessment and plan.  Juel Burrow, DNP, [facility] Palliative Medicine Team Team Phone # [phone]  Pager [phone]

## 2019-11-05 NOTE — Progress Notes (Signed)
Nutrition Follow-up  DOCUMENTATION CODES:   Morbid obesity  INTERVENTION:  Continue Ensure Enlive po TID, each supplement provides 350 kcal and 20 grams of protein  Await decision regarding GOC. If pt continues to have poor PO intake and if aligned with GOC, recommend initiation of nutrition support.  NUTRITION DIAGNOSIS:   Inadequate oral intake related to poor appetite as evidenced by per patient/family report.  Ongoing.  GOAL:   Patient will meet greater than or equal to 90% of their needs  Not met.   MONITOR:   PO intake, Supplement acceptance, Weight trends, Labs, I & O's  REASON FOR ASSESSMENT:   Malnutrition Screening Tool    ASSESSMENT:   Pt presented with a 3 month h/o abdominal pain and a 3 week h/o generalized weakness, anorexia, N/V. 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  5/12 CT abdomen showed diffuse hepatic metastases, omental caking with a complex cystic mass in the LLQ consistent with necrotic lesion, pathologic retroperitoneal lumphadenopathy, trace ascites.  5/14 liver biopsy performed  Liver biopsy revealed metastatic carcinoma with the differential diagnosis including breast cancer and a GYN malignancy. Per MD, a breast prognostic profile is pending.   Pt lethargic upon RD visit; pt's husband at bedside. Pt's husband reports that pt has had very little to eat since admission and that he has been completing the majority of the pt's meals (likely why some meals early in admission are documented as 75-100% consumed). Pt's husband also states that the pt is only accepting sips of her Ensure and is often vomiting shortly after consuming foods/beverages. Pt's husband inquiring about a feeding tube.   Discussed pt with Palliative Care and the husband's interest in the pt receiving a feeding tube. Per Palliative Care, pt  and her husband have stated that they want to know all of the pt's treatment options before deciding on GOC. We will readdress need for feeding tube in a couple of days once the pt and her husband have all of the information they feel necessary to make a decision regarding [place].   PO Intake: 0% x last 8 recorded meals  UOP: 2,541m x24 hours I/O: +6,[phone] admit  Labs: corrected calcium 11.44 (H), CBGs 91-109 Medications reviewed and include: Miacalcin, Novolog, Chronulac   NUTRITION - FOCUSED PHYSICAL EXAM:    Most Recent Value  Orbital Region  No depletion  Upper Arm Region  No depletion  Thoracic and Lumbar Region  No depletion  Buccal Region  No depletion  Temple Region  No depletion  Clavicle Bone Region  No depletion  Clavicle and Acromion Bone Region  No depletion  Scapular Bone Region  No depletion  Dorsal Hand  No depletion  Patellar Region  No depletion  Anterior Thigh Region  No depletion  Posterior Calf Region  No depletion  Edema (RD Assessment)  Moderate  Hair  Reviewed  Eyes  Reviewed  Mouth  Reviewed  Skin  Reviewed  Nails  Reviewed       Diet Order:   Diet Order            Diet regular Room service appropriate? Yes; Fluid consistency: Thin  Diet effective now              EDUCATION NEEDS:   Not appropriate for education at this time  Skin:  Skin Assessment: Skin Integrity Issues: Skin Integrity Issues:: Incisions Incisions: neck, abdomen  Last BM:  5/17  Height:   Ht Readings from Last 1 Encounters:  10/30/19 5' 4" (1.626 m)    Weight:   Wt Readings from Last 1 Encounters:  11/04/19 115.1 kg    BMI:  Body mass index is 43.56 kg/m.  Estimated Nutritional Needs:   Kcal:  1700-1900  Protein:  85-95 grams  Fluid:  >1.7L/d   Larkin Ina, MS, RD, LDN RD pager number and weekend/on-call pager number located in [place].

## 2019-11-06 ENCOUNTER — Inpatient Hospital Stay (HOSPITAL_COMMUNITY): Payer: Medicare Other

## 2019-11-06 DIAGNOSIS — K7290 Hepatic failure, unspecified without coma: Secondary | ICD-10-CM

## 2019-11-06 DIAGNOSIS — K7682 Hepatic encephalopathy: Secondary | ICD-10-CM

## 2019-11-06 DIAGNOSIS — Z515 Encounter for palliative care: Secondary | ICD-10-CM

## 2019-11-06 DIAGNOSIS — C7889 Secondary malignant neoplasm of other digestive organs: Secondary | ICD-10-CM

## 2019-11-06 DIAGNOSIS — L8990 Pressure ulcer of unspecified site, unspecified stage: Secondary | ICD-10-CM | POA: Insufficient documentation

## 2019-11-06 LAB — COMPREHENSIVE METABOLIC PANEL
ALT: 89 U/L — ABNORMAL HIGH (ref 0–44)
AST: 502 U/L — ABNORMAL HIGH (ref 15–41)
Albumin: 1.6 g/dL — ABNORMAL LOW (ref 3.5–5.0)
Alkaline Phosphatase: 1285 U/L — ABNORMAL HIGH (ref 38–126)
Anion gap: 11 (ref 5–15)
BUN: 20 mg/dL (ref 8–23)
CO2: 20 mmol/L — ABNORMAL LOW (ref 22–32)
Calcium: 8.9 mg/dL (ref 8.9–10.3)
Chloride: 110 mmol/L (ref 98–111)
Creatinine, Ser: 0.89 mg/dL (ref 0.44–1.00)
GFR calc Af Amer: >60 mL/min (ref >60)
GFR calc non Af Amer: >60 mL/min (ref >60)
Glucose, Bld: 109 mg/dL — ABNORMAL HIGH (ref 70–99)
Potassium: 4.3 mmol/L (ref 3.5–5.1)
Sodium: 141 mmol/L (ref 135–145)
Total Bilirubin: 3 mg/dL — ABNORMAL HIGH (ref 0.3–1.2)
Total Protein: 5.2 g/dL — ABNORMAL LOW (ref 6.5–8.1)

## 2019-11-06 LAB — CBC WITH DIFFERENTIAL/PLATELET
Abs Immature Granulocytes: 0.58 10*3/uL — ABNORMAL HIGH (ref 0.00–0.07)
Basophils Absolute: 0.1 10*3/uL (ref 0.0–0.1)
Basophils Relative: 1 %
Eosinophils Absolute: 0 10*3/uL (ref 0.0–0.5)
Eosinophils Relative: 0 %
HCT: 35.2 % — ABNORMAL LOW (ref 36.0–46.0)
Hemoglobin: 10.9 g/dL — ABNORMAL LOW (ref 12.0–15.0)
Immature Granulocytes: 3 %
Lymphocytes Relative: 8 %
Lymphs Abs: 1.4 10*3/uL (ref 0.7–4.0)
MCH: 30.4 pg (ref 26.0–34.0)
MCHC: 31 g/dL (ref 30.0–36.0)
MCV: 98.3 fL (ref 80.0–100.0)
Monocytes Absolute: 1.9 10*3/uL — ABNORMAL HIGH (ref 0.1–1.0)
Monocytes Relative: 11 %
Neutro Abs: 13.8 10*3/uL — ABNORMAL HIGH (ref 1.7–7.7)
Neutrophils Relative %: 77 %
Platelets: 319 10*3/uL (ref 150–400)
RBC: 3.58 MIL/uL — ABNORMAL LOW (ref 3.87–5.11)
RDW: 26.5 % — ABNORMAL HIGH (ref 11.5–15.5)
WBC: 17.8 10*3/uL — ABNORMAL HIGH (ref 4.0–10.5)
nRBC: 1.9 % — ABNORMAL HIGH (ref 0.0–0.2)

## 2019-11-06 LAB — GLUCOSE, CAPILLARY
Glucose-Capillary: 101 mg/dL — ABNORMAL HIGH (ref 70–99)
Glucose-Capillary: 103 mg/dL — ABNORMAL HIGH (ref 70–99)
Glucose-Capillary: 101 mg/dL — ABNORMAL HIGH (ref 70–99)
Glucose-Capillary: 103 mg/dL — ABNORMAL HIGH (ref 70–99)
Glucose-Capillary: 96 mg/dL (ref 70–99)

## 2019-11-06 LAB — PROTIME-INR
INR: 1.3 — ABNORMAL HIGH (ref 0.8–1.2)
Prothrombin Time: 15.6 seconds — ABNORMAL HIGH (ref 11.4–15.2)

## 2019-11-06 LAB — AMMONIA: Ammonia: 58 umol/L — ABNORMAL HIGH (ref 9–35)

## 2019-11-06 MED ORDER — COSYNTROPIN 0.25 MG IJ SOLR
0.2500 mg | Freq: Once | INTRAMUSCULAR | Status: AC
  Administered 2019-11-07: 0.25 mg via INTRAVENOUS
  Filled 2019-11-06: qty 0.25

## 2019-11-06 MED ORDER — CITALOPRAM HYDROBROMIDE 10 MG PO TABS
10.0000 mg | ORAL_TABLET | Freq: Every day | ORAL | Status: DC
  Administered 2019-11-08 – 2019-11-19 (×11): 10 mg via ORAL
  Filled 2019-11-06 (×13): qty 1

## 2019-11-06 MED ORDER — LACTULOSE 10 GM/15ML PO SOLN
20.0000 g | Freq: Three times a day (TID) | ORAL | Status: DC
  Administered 2019-11-06 – 2019-11-08 (×7): 20 g via ORAL
  Filled 2019-11-06 (×8): qty 30

## 2019-11-06 NOTE — Progress Notes (Signed)
PT Cancellation Note  Patient Details Name: [name] MRN: [id number] DOB: 1946-04-26   Cancelled Treatment:    Reason Eval/Treat Not Completed: Patient at procedure or test/unavailable.  Pt in a family meeting with her MD and family.  RN stated pt not feeling well also.  Will likely not make it back today.  Will see pt as able 5/21. 11/06/2019  Ginger Carne., PT Acute Rehabilitation Services [phone]  (pager) [phone]  (office)   Tessie Fass Sylvie Mifsud 11/06/2019, 5:05 PM

## 2019-11-06 NOTE — Progress Notes (Addendum)
HEMATOLOGY-ONCOLOGY PROGRESS NOTE  SUBJECTIVE: [name] is more awake today.  However, she remains confused.  She knows that she is at [facility] but cannot communicate what year or month it is.  She states that her abdomen hurts.  Husband is at the bedside.  PHYSICAL EXAMINATION:  Vitals:   11/06/19 0945 11/06/19 1327  BP: (!) 96/44 99/62  Pulse: (!) 103 96  Resp: 20 (!) 21  Temp: 97.7 F (36.5 C) 98.1 F (36.7 C)  SpO2: 95% 95%   Filed Weights   11/04/19 0422 11/04/19 2104 11/06/19 0517  Weight: 112.7 kg 115.1 kg 116.3 kg    Intake/Output from previous day: 05/19 0701 - 05/20 0700 In: 2380.1 [P.O.:600; I.V.:1780.1] Out: 300 [Urine:300]  Lungs: Clear anteriorly Cardiac: Regular rate and rhythm ABDOMEN: Positive bowel sounds, abdomen nontender, hepatomegaly noted NEURO: alert, oriented to place.  Not oriented to year or situation. Vascular: Pitting edema at the feet bilaterally  LABORATORY DATA:  I have reviewed the data as listed CMP Latest Ref Rng & Units 11/06/2019 11/05/2019 11/04/2019  Glucose 70 - 99 mg/dL 109(H) 93 97  BUN 8 - 23 mg/dL 20 14 14   Creatinine 0.44 - 1.00 mg/dL 0.89 0.73 0.66  Sodium 135 - 145 mmol/L 141 143 139  Potassium 3.5 - 5.1 mmol/L 4.3 5.1 4.1  Chloride 98 - 111 mmol/L 110 109 107  CO2 22 - 32 mmol/L 20(L) 22 21(L)  Calcium 8.9 - 10.3 mg/dL 8.9 9.6 10.1  Total Protein 6.5 - 8.1 g/dL 5.2(L) 5.5(L) 5.3(L)  Total Bilirubin 0.3 - 1.2 mg/dL 3.0(H) 2.7(H) 2.5(H)  Alkaline Phos 38 - 126 U/L 1,285(H) 1,346(H) 1,396(H)  AST 15 - 41 U/L 502(H) 251(H) 212(H)  ALT 0 - 44 U/L 89(H) 51(H) 43    Lab Results  Component Value Date   WBC 17.8 (H) 11/06/2019   HGB 10.9 (L) 11/06/2019   HCT 35.2 (L) 11/06/2019   MCV 98.3 11/06/2019   PLT 319 11/06/2019   NEUTROABS 13.8 (H) 11/06/2019    CT CHEST WO CONTRAST  Result Date: 10/30/2019 CLINICAL DATA:  Metastatic malignancy EXAM: CT CHEST WITHOUT CONTRAST TECHNIQUE: Multidetector CT imaging of the  chest was performed following the standard protocol without IV contrast. COMPARISON:  10/29/2019, 06/18/2019 FINDINGS: Cardiovascular: No pericardial effusion. Normal caliber of the thoracic aorta. Minimal atherosclerosis. Mediastinum/Nodes: Numerous enlarged mediastinal lymph nodes are identified. Index lymph node in the right paratracheal region measures 21 x 21 mm, previous having measured 27 x 22 mm. Increased number of enlarged lymph nodes throughout the mediastinum compared to prior neck CT. The trachea, esophagus, and thyroid are unremarkable. Lungs/Pleura: There is a small spiculated sub solid nodule in the right upper lobe image 35, measuring 5 mm. No other pulmonary nodules or masses. Mild upper lobe predominant emphysema. Hypoventilatory changes at the lung bases. No large effusion or pneumothorax. Upper Abdomen: Please refer to recent abdominal CT describing numerous hepatic metastases as well as omental caking and trace ascites. Musculoskeletal: There are no acute or destructive bony lesions. Reconstructed images demonstrate no additional findings. IMPRESSION: 1. Nonspecific sub solid 5 mm right upper lobe pulmonary nodule. Neoplasm cannot be excluded in light of findings elsewhere within the chest and abdomen. 2. Extensive mediastinal lymphadenopathy worrisome for nodal metastases. 3. Aortic Atherosclerosis (ICD10-I70.0) and Emphysema (ICD10-J43.9). 4. Please refer to recent abdominal CT describing diffuse hepatic metastases. Electronically Signed   By: Randa Ngo M.D.   On: 10/30/2019 21:56   MR BRAIN W WO CONTRAST  Result  Date: 10/31/2019 CLINICAL DATA:  Weakness, metastases EXAM: MRI HEAD WITHOUT AND WITH CONTRAST TECHNIQUE: Multiplanar, multiecho pulse sequences of the brain and surrounding structures were obtained without and with intravenous contrast. CONTRAST:  53mL GADAVIST GADOBUTROL 1 MMOL/ML IV SOLN COMPARISON:  09/30/2018 FINDINGS: Motion artifact is present Brain: There is no acute  infarction or intracranial hemorrhage. There is no intracranial mass, mass effect, or edema. There is no hydrocephalus or extra-axial fluid collection. Ventricles and sulci are stable in size and configuration. Patchy T2 hyperintensity in the supratentorial and pontine white matter is nonspecific but probably reflects mild chronic microvascular ischemic changes. No abnormal enhancement. Vascular: Major vessel flow voids at the skull base are preserved. Skull and upper cervical spine: Normal marrow signal is preserved. There is susceptibility artifact related to partially imaged cervical spine fusion. Sinuses/Orbits: Paranasal sinuses are aerated. Orbits are unremarkable. Other: Sella is unremarkable.  Mastoid air cells are clear. IMPRESSION: Suboptimal evaluation due to motion artifact. No evidence of intracranial metastatic disease. No acute infarction or hemorrhage. Electronically Signed   By: Macy Mis M.D.   On: 10/31/2019 14:24   CT ABDOMEN PELVIS W CONTRAST  Result Date: 10/29/2019 CLINICAL DATA:  Hyperkalemia,, liver disease, emesis, weight loss EXAM: CT ABDOMEN AND PELVIS WITH CONTRAST TECHNIQUE: Multidetector CT imaging of the abdomen and pelvis was performed using the standard protocol following bolus administration of intravenous contrast. CONTRAST:  69mL OMNIPAQUE IOHEXOL 300 MG/ML  SOLN COMPARISON:  06/17/2019, 01/05/2006 FINDINGS: Lower chest: No acute pleural or parenchymal lung disease. Hepatobiliary: There are innumerable masses replacing the majority of the liver parenchyma consistent with diffuse metastatic disease. The gallbladder is surgically absent. Pancreas: Unremarkable. No pancreatic ductal dilatation or surrounding inflammatory changes. Spleen: Normal in size without focal abnormality. Adrenals/Urinary Tract: Adrenal glands are unremarkable. Kidneys are normal, without renal calculi, focal lesion, or hydronephrosis. Bladder is unremarkable. Stomach/Bowel: No bowel obstruction or  ileus. No bowel wall thickening or inflammatory changes. Vascular/Lymphatic: There is pathologic adenopathy within the retroperitoneum. Largest pathologic lymph node in the left external iliac chain reference image 77 measures 3.5 x 3.1 cm. A second index lymph node at the right iliac bifurcation measures 2.6 x 2.2 cm reference image 62. There is minimal atherosclerosis of the aorta. Reproductive: Uterus is surgically absent. I do not see any adnexal lesions. Other: There is soft tissue fat stranding throughout the ventral omentum consistent with omental caking. Complex cystic mass within the left lower quadrant along the anterior abdominal wall measuring 5.7 x 7.2 cm is most consistent with a necrotic mass. Mural thickening along the anterior and lateral margin of the mass. There is trace free fluid within the upper abdomen surrounding the liver and spleen. No free intra-abdominal gas. Musculoskeletal: There are no acute or destructive bony lesions. Reconstructed images demonstrate no additional findings. IMPRESSION: 1. Diffuse hepatic metastases. 2. Omental caking. Complex cystic mass left lower quadrant consistent with necrotic lesion. 3. Pathologic retroperitoneal lymphadenopathy, most pronounced within the iliac distributions left greater than right. 4. Trace ascites. Electronically Signed   By: Randa Ngo M.D.   On: 10/29/2019 22:15   US BIOPSY (LIVER)  Result Date: 10/31/2019 INDICATION: 74 year old female with multifocal hepatic lesions concerning for metastatic disease. She presents for ultrasound-guided biopsy of the same. EXAM: ULTRASOUND BIOPSY CORE LIVER MEDICATIONS: None. ANESTHESIA/SEDATION: Moderate (conscious) sedation was employed during this procedure. A total of Versed 1 mg and Fentanyl 50 mcg was administered intravenously. Moderate Sedation Time: 13 minutes. The patient's level of consciousness and vital signs were  monitored continuously by radiology nursing throughout the procedure  under my direct supervision. FLUOROSCOPY TIME:  None. COMPLICATIONS: None immediate. PROCEDURE: Informed written consent was obtained from the patient after a thorough discussion of the procedural risks, benefits and alternatives. All questions were addressed. A timeout was performed prior to the initiation of the procedure. The liver was interrogated with ultrasound. There are innumerable hypoechoic solid lesions scattered throughout the liver. A suitable lesion in the left hepatic lobe was identified. The overlying skin was sterilely prepped and draped in the standard fashion. Local anesthesia was attained by infiltration with 1% lidocaine. A small dermatotomy was made. Under real-time ultrasound guidance, multiple 18 gauge core biopsies were obtained using the bio Pince automated biopsy device. Biopsies were performed coaxially through a 17 gauge introducer needle. As the introducer needle was withdrawn, the biopsy tract was embolized with a Gel-Foam slurry. Post ultrasound imaging demonstrates no evidence of immediate complication. IMPRESSION: Technically successful ultrasound-guided core biopsy of liver lesion. Electronically Signed   By: Jacqulynn Cadet M.D.   On: 10/31/2019 12:09   DG CHEST PORT 1 VIEW  Result Date: 11/05/2019 CLINICAL DATA:  Acute respiratory distress EXAM: PORTABLE CHEST 1 VIEW COMPARISON:  06/17/2019 FINDINGS: Single frontal view of the chest demonstrates an enlarged cardiac silhouette. Lung volumes are diminished. Mild increased interstitial prominence with central vascular congestion consistent with mild fluid overload. Small right pleural effusion. No pneumothorax. IMPRESSION: 1. Findings suggesting mild interstitial edema. Electronically Signed   By: Randa Ngo M.D.   On: 11/05/2019 22:51    ASSESSMENT AND PLAN: 1.  Hepatic metastases, omental caking, and mediastinal/retroperitoneal lymphadenopathy concerning for underlying malignancy 2.  Elevated total bilirubin and  LFTs 3.  AKI secondary to dehydration 4.  Hypercalcemia 5.  Deconditioning 6.  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  Diabetes mellitus 8.  Temporal arteritis, status post treatment 9.  Migraine headaches 10.  Leg weakness-steroid myopathy?,  Paraneoplastic syndrome?  [name] appears unchanged.  She may be a bit more alert today but still remains confused.  Pathology still running IHC staining on her liver biopsy.  In communication with pathologist today, staining most consistent with GYN origin. However, the patient had a total hysterectomy in the past and pathology was negative for malignancy at that time.  Other potential differentials include a primary peritoneal carcinoma, renal cell cancer, or thyroid cancer.  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  Performance status is 4 and she has ongoing liver dysfunction.    Discussed potential differentials and potential treatment options including chemotherapy with the patient and her husband.  The patient did not engage in the conversation.  Discussed that chemotherapy would be the recommended treatment, but due to poor performance status and liver dysfunction, may make her condition worse.  The patient's husband seems to understand this.  We also discussed that if she does not start to improve in the next 1 to 2 days, would recommend focusing on comfort and consideration of hospice.  We discussed home with hospice versus residential hospice.  The patient has expressed in the past that she would want to go home with hospice, but the family is unsure if they can care for her at home.  We will continue to follow up pathology for final result and communicate this to the family as soon as we know this information.  Recommendations: 1.  We will add on a CA-125 to lab work from this morning. 2.  Await final  pathology report. 3.  Continue ongoing goals of care discussion with the patient and her family.   LOS: 7 days   Mikey Bussing 11/06/19  [name] was interviewed and examined.  She remains confused despite correction of the hypercalcemia and treatment with lactulose.  The confusion may be related to hepatic encephalopathy or hospital delirium.  She has an advanced metastatic malignancy with a poor performance status, currently ECOG 4.  I have discussed the case with pathology on several occasions over the last 2 days.  The immunohistochemical stains reveal the tumor cells to be positive for cytokeratin 7, ER, and PAX8.  The differential diagnosis includes a gynecologic malignancy, renal cell carcinoma, and thyroid cancer.  She had a remote hysterectomy/bilateral oophorectomy without malignancy.  No evidence of a mass in the thyroid or kidneys on the admission CTs.  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  I discussed the differential diagnosis with her husband.  He understands her performance status is too poor to receive chemotherapy at present.  She also has significant liver enzyme abnormalities which limit chemotherapy options.  I recommend hospice care unless her mental status and performance status improve over the next few days.  I suspect her life span will be measured in days to weeks.  She appears to be a candidate for residential hospice.

## 2019-11-06 NOTE — Progress Notes (Signed)
PROGRESS NOTE    [name]  ZOX:[id number] DOB: 01/02/1946 DOA: 10/29/2019 PCP: Leonides Sake, MD    Chief Complaint  Patient presents with  . Abdominal Pain  . Abnormal Lab    Brief Narrative:  8 white female DM TY 2 temporal arteritis reflux migraines OSA cervical radiculopathy polypharmacy with multiple psychotropics Recently admitted 06/17/2019--> 06/25/2019- found to have thrush weakness dysphagia and a neck mass but no source of mass on imaging Admit from ED 5/12 subacute abdominal pain waxing waning over 3 months more acutely 3 weeks ago developed worsening weakness poor appetite nausea PCP concerned about cancer-came to ED found to have hepatic metastases omental caking complex cystic left lower quadrant mass and retroperitoneal adenopathy also found to have hyponatremia hypocalcemia AKI Underwent liver biopsy by IR 5/14    Assessment & Plan:   Principal Problem:   Metastatic neoplastic disease ([place]) Active Problems:   Uncontrolled type 2 diabetes mellitus with hyperglycemia, with long-term current use of insulin (HCC)   Hypercalcemia   AKI (acute kidney injury) ([place])   Dehydration with hyponatremia   GERD without esophagitis   Metastatic cancer ([place])   Palliative care by specialist   Goals of care, counseling/discussion   DNR (do not resuscitate)   Hypercalcemia of malignancy   Hepatic encephalopathy ([place])   Palliative care encounter   Pressure injury of skin   Probable metastatic stage IV breast cancer 1. Liver biopsy pending for definitive diagnosis-greatly appreciate oncology Dr. Julieanne Manson input and continued follow-up 2. It appears that she is not a candidate for chemo based on her metastatic process, encephalopathy etc. please see below Volume depletion, mild hyperkalemia 1. BUN/creatinine about the same as on admission 2. Continue saline 75 cc/H 3. Potassium improved to 4.3 Hypercalciumia 1. Received zoledronic acid earlier in admission  and then nasal calcitonin 2. 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 Bilirubin i climbing slightly in the 3 range 3. We will increase lactulose to 20 3 times daily to see if this makes a difference-she has already seen to improved in terms of mentation to some degree Temporal arteritis Possible adrenal insufficiency/addisions secondary to high-dose steroids which were weaned about 3 weeks ago 1. Steroids were held by previous physician 2. I did discuss with family possibility of adrenal insufficiency-if no improvement with increasing lactulose as above-ordering  AM cortisol and cosyntropin test Metabolic encephalopathy 1. Probably secondary to decompensated either cirrhosis or metastases from liver as she is on lactulose-she seems to be improving slightly and her eyes are open she is more purposeful and has been eating 2. Increased lactulose dosing as above-hold on adding rifaximin Possible DVT in left upper extremity although looks more like intravasation of contrast or saline 1. Stat duplex ultrasound left upper extremity-MD to be informed by page if this is positive  Patient has an overall poor prognosis based on multiple factors inclusive of continued metabolic encephalopathy and inability to ambulate Currently family is hopeful regarding her improvement but after discussion with palliative medicine and oncology it has been made clear that she has no chemo options unless she greatly improves and is more ambulatory coherent etc. The plan for discharge is discharged to [facility] of [facility] within the next 24 to 48 hours for potential end-of-life care unless something drastically changes I have discussed with palliative medicine PA and  have messaged Dr. Benay Spice and they seem agreeable with this plan   DVT prophylaxis:  Lovenox Code Status: DNR confirmed Family Communication: None at the bedside  disposition: Inpatient  Status is: Inpatient  Remains inpatient appropriate because:Altered mental status   Dispo: The patient is from: Home              [facility]tic[facility]              Anticipated d/c date is: 2 days              Patient currently is not medically stable to d/c.   Asking to eat and is a little bit more agitated according to family She went down for swallow eval earlier today at my request Consultants:   Oncology  Palliative medicine  Procedures: Liver biopsy 5/14  Antimicrobials: None   Subjective: More arousable Seems to understand she has cancer--and discussion with Horris Latino the speech therapist and it seems like she is cleared for regular diet but needs to be supervised Her left upper extremity seems really swollen Objective: Vitals:   11/06/19 0140 11/06/19 0517 11/06/19 0945 11/06/19 1327  BP: 122/79 121/80 (!) 96/44 99/62  Pulse: (!) 118 (!) 114 (!) 103 96  Resp: (!) 22 20 20  (!) 21  Temp: 98.5 F (36.9 C) 98.4 F (36.9 C) 97.7 F (36.5 C) 98.1 F (36.7 C)  TempSrc: Oral Oral Oral   SpO2: 93% 94% 95% 95%  Weight:  116.3 kg    Height:        Intake/Output Summary (Last 24 hours) at 11/06/2019 1721 Last data filed at 11/06/2019 1100 Gross per 24 hour  Intake 1780.05 ml  Output 500 ml  Net 1280.05 ml   Filed Weights   11/04/19 0422 11/04/19 2104 11/06/19 0517  Weight: 112.7 kg 115.1 kg 116.3 kg    Examination:  General exam: Thick neck Mallampati 4 no icterus no pallor neck soft supple no thyromegaly Respiratory system: Chest clear without added Cardiovascular system: S1-S2 no murmur rub Gastrointestinal system: Soft nontender no rebound no hepatosplenomegaly, some ascites? Central nervous system: Sleepy arousable but does not choose to awaken Power 5/5 reflexes deferred Extremities: No lower extremity  edema Skin: None Psychiatry: irritable    Data Reviewed: I have personally reviewed following labs and imaging studies Potassium 5.1 up from 4.1-->4.3 Alk phos 1396-->1346-->1258 Albumin 1.7-->1.6 AST/ALT 212/43-->251/51-->502/89 Ammonia 52-->58 Bilirubin 2.7-->3.0  Radiology Studies: DG CHEST PORT 1 VIEW  Result Date: 11/05/2019 CLINICAL DATA:  Acute respiratory distress EXAM: PORTABLE CHEST 1 VIEW COMPARISON:  06/17/2019 FINDINGS: Single frontal view of the chest demonstrates an enlarged cardiac silhouette. Lung volumes are diminished. Mild increased interstitial prominence with central vascular congestion consistent with mild fluid overload. Small right pleural effusion. No pneumothorax. IMPRESSION: 1. Findings suggesting mild interstitial edema. Electronically Signed   By: Randa Ngo M.D.   On: 11/05/2019 22:51   DG Swallowing Func-Speech Pathology  Result Date: 11/06/2019 Objective Swallowing Evaluation: Type of Study: MBS-Modified Barium Swallow Study  Patient Details Name: MATTY DEAMER MRN: [id number] Date of Birth: 06-08-1946 Today's Date: 11/06/2019 Time: SLP Start Time (ACUTE ONLY): 14 -SLP Stop Time (ACUTE ONLY): 1300 SLP Time Calculation (min) (ACUTE ONLY): 30 min Past Medical History: Past Medical History: Diagnosis Date . ADD (attention deficit disorder)  . Anxiety  . Arthritis   os . Breast cancer ([place])  . Diabetes mellitus without complication ([place])  . GERD (gastroesophageal reflux disease)  barretts . Headache  . High cholesterol  . Neck pain  . Obesity  . Personal history of radiation therapy 2005 . Sleep apnea   cpap   last sleep study> 3 yrs . Swelling of both ankles  . Weakness 06/2019 Past Surgical History: Past Surgical History: Procedure Laterality Date . ABDOMINAL HYSTERECTOMY   . ANTERIOR CERVICAL DECOMP/DISCECTOMY FUSION N/A 02/15/2016  Procedure: Cervical three four-Cervical five-six, Cervical six-seven Anterior cervical decompression/diskectomy/fusion;   Surgeon: Kristeen Miss, MD;  Location: [facility];  Service: Neurosurgery;  Laterality: N/A; . bicept surgery   . BREAST LUMPECTOMY   . CHOLECYSTECTOMY   . JOINT REPLACEMENT   . PAROTID GLAND TUMOR EXCISION   . TONSILLECTOMY   . TOTAL KNEE ARTHROPLASTY  07/19/2012  Procedure: TOTAL KNEE ARTHROPLASTY;  Surgeon: Yvette Rack., MD;  Location: [facility];  Service: Orthopedics;  Laterality: Right;  right total knee arthroplasty . TUBAL LIGATION   HPI: Pt is a 74 year old female admitted with medical decline and abnormal labs, found to have signs of malignant breast cancer with hepatic metastases. Admitted with subacute abdominal pain waxing waning over 3 months more acutely 3 weeks ago developed worsening weakness poor appetite nausea.  Has had difficulty swallowing with some question of a neck mass and thrust. CT shows Extensive mediastinal lymphadenopathy worrisome for nodal metastases.  No data recorded Assessment / Plan / Recommendation CHL IP CLINICAL IMPRESSIONS 11/06/2019 Clinical Impression  Pt demonstrates mild oropharyngeal dysphagia, appearing primarily due to decreased awareness and mentation. Pt kept her eyes closed during session but followed commands as needed, When taking sips, there were instances of brief oral holding and also slight premature spillage. Airway closure was not complete at times during the swallow leading to instances of flash frank penetration. Typically no cough was elicited during these events, but as study progressed pt coughed more and mroe often, not appearing to be related to pharyngeal impairment. Esophageal sweep showed some distal residue and pill needed several bolus to fully clear. Pt is capable of regular solids and thin liquids but she should only eat and drink when fully alert. Will f/u for tolerance of diet and further modifications as needed.  SLP Visit Diagnosis Dysphagia, unspecified (R13.10) Attention and concentration deficit following -- Frontal lobe and executive  function deficit following -- Impact on safety and function Mild aspiration risk;Risk for inadequate nutrition/hydration   CHL IP TREATMENT RECOMMENDATION 11/06/2019 Treatment Recommendations Therapy as outlined in treatment plan below   No flowsheet data found. CHL IP DIET RECOMMENDATION 11/06/2019 SLP Diet Recommendations -- Liquid Administration via Cup;Straw Medication Administration Whole meds with liquid Compensations -- Postural Changes Seated upright at 90 degrees;Remain semi-upright after after feeds/meals (Comment)   No flowsheet data found.  CHL IP FOLLOW UP RECOMMENDATIONS 11/06/2019 Follow up Recommendations 24 hour supervision/assistance   CHL IP FREQUENCY AND DURATION 11/06/2019 Speech Therapy Frequency (ACUTE ONLY) min 1 x/week Treatment Duration 1 week      CHL IP ORAL PHASE 11/06/2019 Oral Phase Impaired Oral - Pudding Teaspoon -- Oral - Pudding Cup -- Oral - Honey Teaspoon -- Oral - Honey Cup -- Oral - Nectar Teaspoon -- Oral - Nectar Cup -- Oral - Nectar Straw -- Oral - Thin Teaspoon Premature spillage;Decreased bolus cohesion Oral - Thin Cup -- Oral - Thin Straw Delayed oral transit Oral - Puree Delayed oral transit Oral - Mech Soft -- Oral - Regular Delayed oral transit Oral - Multi-Consistency -- Oral - Pill -- Oral Phase - Comment --  CHL IP PHARYNGEAL PHASE 11/06/2019 Pharyngeal Phase Impaired Pharyngeal- Pudding Teaspoon -- Pharyngeal -- Pharyngeal- Pudding Cup -- Pharyngeal -- Pharyngeal- Honey Teaspoon -- Pharyngeal -- Pharyngeal- Honey Cup -- Pharyngeal -- Pharyngeal- Nectar Teaspoon -- Pharyngeal -- Pharyngeal- Nectar Cup -- Pharyngeal -- Pharyngeal- Nectar Straw -- Pharyngeal -- Pharyngeal- Thin Teaspoon Penetration/Aspiration before swallow;Penetration/Aspiration during swallow Pharyngeal Material enters airway, remains ABOVE vocal cords then ejected out Pharyngeal- Thin Cup -- Pharyngeal -- Pharyngeal- Thin Straw Penetration/Aspiration before swallow;Penetration/Aspiration during swallow  Pharyngeal Material enters airway, CONTACTS cords and then ejected out;Material enters airway, remains ABOVE vocal cords then ejected out;Material does not enter airway Pharyngeal- Puree WFL Pharyngeal -- Pharyngeal- Mechanical Soft -- Pharyngeal -- Pharyngeal- Regular WFL Pharyngeal -- Pharyngeal- Multi-consistency -- Pharyngeal -- Pharyngeal- Pill WFL Pharyngeal -- Pharyngeal Comment --  No flowsheet data found. DeBlois, Katherene Ponto 11/06/2019, 2:48 PM              Scheduled Meds: . [START ON 11/07/2019] citalopram  10 mg Oral Daily  . feeding supplement (ENSURE ENLIVE)  237 mL Oral TID BM  . fluticasone  1 spray Each Nare BID  . insulin aspart  0-9 Units Subcutaneous Q4H  . lactulose  20 g Oral TID  . nystatin  5 mL Mouth/Throat QID  . nystatin   Topical TID  . pantoprazole  40 mg Oral Q supper   Continuous Infusions: . sodium chloride 75 mL/hr at 11/05/19 1750     LOS: 7 days   Time spent: 35  Nita Sells, MD [facility]   To contact the attending provider between 7A-7P or the covering provider during after hours 7P-7A, please log into the web site [email or web address] and access using universal [place] password for that web site. If you do not have the password, please call the hospital operator.  11/06/2019, 5:21 PM

## 2019-11-06 NOTE — Evaluation (Signed)
Clinical/Bedside Swallow Evaluation Patient Details  Name: [name] MRN: [id number] Date of Birth: [birth year]  Today's Date: 11/06/2019 Time: SLP Start Time (ACUTE ONLY): 0900 SLP Stop Time (ACUTE ONLY): 1116 SLP Time Calculation (min) (ACUTE ONLY): 136 min  Past Medical History:  Past Medical History:  Diagnosis Date  . ADD (attention deficit disorder)   . Anxiety   . Arthritis    os  . Breast cancer ([place])   . Diabetes mellitus without complication ([place])   . GERD (gastroesophageal reflux disease)    barretts  . Headache   . High cholesterol   . Neck pain   . Obesity   . Personal history of radiation therapy 2005  . Sleep apnea    cpap   last sleep study> 3 yrs  . Swelling of both ankles   . Weakness 06/2019   Past Surgical History:  Past Surgical History:  Procedure Laterality Date  . ABDOMINAL HYSTERECTOMY    . ANTERIOR CERVICAL DECOMP/DISCECTOMY FUSION N/A 02/15/2016   Procedure: Cervical three four-Cervical five-six, Cervical six-seven Anterior cervical decompression/diskectomy/fusion;  Surgeon: Kristeen Miss, MD;  Location: [facility];  Service: Neurosurgery;  Laterality: N/A;  . bicept surgery    . BREAST LUMPECTOMY    . CHOLECYSTECTOMY    . JOINT REPLACEMENT    . PAROTID GLAND TUMOR EXCISION    . TONSILLECTOMY    . TOTAL KNEE ARTHROPLASTY  07/19/2012   Procedure: TOTAL KNEE ARTHROPLASTY;  Surgeon: Yvette Rack., MD;  Location: [facility];  Service: Orthopedics;  Laterality: Right;  right total knee arthroplasty  . TUBAL LIGATION     HPI:  Pt is a 74 year old female admitted with medical decline and abnormal labs, found to have signs of malignant breast cancer with hepatic metastases. Admitted with subacute abdominal pain waxing waning over 3 months more acutely 3 weeks ago developed worsening weakness poor appetite nausea.  Has had difficulty swallowing with some question of a neck mass and thrust. CT shows Extensive mediastinal lymphadenopathy worrisome for  nodal metastases.   Assessment / Plan / Recommendation Clinical Impression  Pt demonstrates immediate coughing, and later belching, with thin liquids. When given nectar thick liquids or puree there is no coughing. Etiology of dysphagia unable to be determined with subjective assessment. Discussed with MD who would like instrumental assessment to provide futher information to pt and family for plan of care. Will f/u with MBS later today.  SLP Visit Diagnosis: Dysphagia, unspecified (R13.10)    Aspiration Risk  Moderate aspiration risk    Diet Recommendation NPO        Other  Recommendations     Follow up Recommendations 24 hour supervision/assistance      Frequency and Duration            Prognosis        Swallow Study   General HPI: Pt is a 74 year old female admitted with medical decline and abnormal labs, found to have signs of malignant breast cancer with hepatic metastases. Admitted with subacute abdominal pain waxing waning over 3 months more acutely 3 weeks ago developed worsening weakness poor appetite nausea.  Has had difficulty swallowing with some question of a neck mass and thrust. CT shows Extensive mediastinal lymphadenopathy worrisome for nodal metastases. Type of Study: Bedside Swallow Evaluation Previous Swallow Assessment: BSE last admission, thrush, regualr thin Diet Prior to this Study: NPO Temperature Spikes Noted: No Respiratory Status: Room air History of Recent Intubation: No Behavior/Cognition: Distractible;Lethargic/Drowsy Oral  Cavity Assessment: Within Functional Limits Oral Care Completed by SLP: No Oral Cavity - Dentition: Adequate natural dentition Self-Feeding Abilities: Total assist Patient Positioning: Upright in bed Baseline Vocal Quality: Normal Volitional Cough: Cognitively unable to elicit Volitional Swallow: Unable to elicit    Oral/Motor/Sensory Function Overall Oral Motor/Sensory Function: Within functional limits   Ice Chips Ice  chips: Impaired Presentation: Spoon Pharyngeal Phase Impairments: Cough - Immediate   Thin Liquid Thin Liquid: Impaired Pharyngeal  Phase Impairments: Cough - Immediate    Nectar Thick Nectar Thick Liquid: Within functional limits Presentation: Spoon;Straw   Honey Thick Honey Thick Liquid: Not tested   Puree Puree: Within functional limits Presentation: Spoon   Solid            Aiko Belko, Katherene Ponto 11/06/2019,11:08 AM

## 2019-11-06 NOTE — TOC Progression Note (Signed)
Transition of Care [facility]) - Progression Note    Patient Details  Name: [name] MRN: [id number] Date of Birth: 1945/11/01  Transition [facility]) CM/SW Contact  Bartholomew Crews, RN Phone Number: [phone] 11/06/2019, 5:02 PM  Clinical Narrative:     Notified by MD of anticipated patient need for inpatient hospice in [facility] Referral placed. Verified no covid test needed for admission. TOC following for transition needs.   Expected Discharge Plan: [place] Barriers to Discharge: Continued Medical Work up  Expected Discharge Plan and Services Expected Discharge Plan: [place] arrangements for the past 2 months: Single Family Home                                       Social Determinants of Health (SDOH) Interventions    Readmission Risk Interventions No flowsheet data found.

## 2019-11-06 NOTE — Progress Notes (Signed)
Daily Progress Note   Patient Name: [name]       Date: 11/06/2019 DOB: June 04, 1946  Age: 74 y.o. MRN#: [id number] Attending Physician: Nita Sells, MD Primary Care Physician: Leonides Sake, MD Admit Date: 10/29/2019  Reason for Consultation/Follow-up: Establishing goals of care, Hospice Evaluation, Inpatient hospice referral and Psychosocial/spiritual support     Subjective: Patient lethargic but tells [place] her husband cut her fingernails.  Denies pain.  Unable to answer further questions.  Spoke with [employer].  He has a good understanding of his wife's medical condition.  He asked me if she had a prognosis of 4 weeks.  I explained that she is not eating so her prognosis is unfortunately less than that.  Monica Martinez is thankful he does not want his wife to suffer.  Monica Martinez is appreciative of the excellent care Chong Sicilian has received.  He would be delighted to be able to have her more alert if possible.  Glen relayed the information to me that Oncology discussed with him.  He would like for her to go to [facility].   He was considering [facility] and feels that [facility] may be more convenient for his family to visit.  I offered Monica Martinez support and will check in again tomorrow.   Assessment: Metastatic cancer with liver metastasis, peritoneal carcinomatosis.  Hypercalcemia corrected.  LFTs elevated and trending up.  Functional status too poor for chemotherapy.   Patient Profile/HPI:  Per intake H&P --> Patient is 74 year old female with history of type 2 diabetes on insulin, temporal arteritis recently treated with very high dose of steroids, GERD, migraine headaches, obstructive sleep apnea who presents to the emergency room with abnormal lab from her primary care  physician's office.  Recently getting extremely debilitated and unable to walk since last 2 weeks. In the emergency room, hemodynamically stable. Clinically dehydrated. CT scan abdomen pelvis shows extensive hepatic metastatic disease, omental caking and complex cyst left lower quadrant, multiple lymphadenopathy.  Length of Stay: 7   Vital Signs: BP 99/62 (BP Location: Right Arm)    Pulse 96    Temp 98.1 F (36.7 C)    Resp (!) 21    Ht 5\' 4"  (1.626 m)    Wt 116.3 kg    SpO2 95%    BMI 44.01 kg/m  SpO2: SpO2:  95 % O2 Device: O2 Device: Room Air O2 Flow Rate:         Palliative Assessment/Data: 20%     Palliative Care Plan    Recommendations/Plan:  Family understands patient's illness and prognosis  They would like for her to be more alert if possible  They elect to request a bed at [facility] facility in [place].  PMT will continue to follow for support and symptom management as needed.  Code Status:  DNR  Prognosis:   < 2 weeks.  Patient not eating/drinking.  Encephalopathic.  Omental caking with wide spread metastasis.  Hypercalcemia.   Discharge Planning:  Hospice facility  Care plan was discussed with husband, oncology team, [facility] attending MD.  Thank you for allowing the Palliative Medicine Team to assist in the care of this patient.  Total time spent:  35 min.     Greater than 50%  of this time was spent counseling and coordinating care related to the above assessment and plan.  Florentina Jenny, PA-C Palliative Medicine  Please contact Palliative MedicineTeam phone at [phone] for questions and concerns between 7 am - 7 pm.   Please see AMION for individual provider pager numbers.

## 2019-11-06 NOTE — Progress Notes (Addendum)
Modified Barium Swallow Progress Note  Patient Details  Name: [name] MRN: [id number] Date of Birth: [birth year]  Today's Date: 11/06/2019  Modified Barium Swallow completed.  Full report located under Chart Review in the Imaging Section.  Brief recommendations include the following:  Clinical Impression  Pt demonstrates mild oropharyngeal dysphagia, appearing primarily due to decreased awareness and mentation. Pt kept her eyes closed during session but followed commands as needed, When taking sips, there were instances of brief oral holding and also slight premature spillage. Airway closure was not complete at times during the swallow leading to instances of flash frank penetration. Typically no cough was elicited during these events, but as study progressed pt coughed more and mroe often, not appearing to be related to pharyngeal impairment. Esophageal sweep showed some distal residue and pill needed several bolus to fully clear. Pt is capable of regular solids and thin liquids but she should only eat and drink when fully alert. Will f/u for tolerance of diet and further modifications as needed.    Swallow Evaluation Recommendations           Liquid Administration via: Cup;Straw   Medication Administration: Whole meds with liquid   Supervision: Staff to assist with self feeding       Postural Changes: Seated upright at 90 degrees;Remain semi-upright after after feeds/meals (Comment)           Herbie Baltimore, MA CCC-SLP  Acute Rehabilitation Services Pager [phone] Office [phone]  Dione Petron, Katherene Ponto 11/06/2019,2:47 PM

## 2019-11-07 ENCOUNTER — Inpatient Hospital Stay: Payer: Self-pay

## 2019-11-07 ENCOUNTER — Inpatient Hospital Stay (HOSPITAL_COMMUNITY): Payer: Medicare Other

## 2019-11-07 DIAGNOSIS — I82409 Acute embolism and thrombosis of unspecified deep veins of unspecified lower extremity: Secondary | ICD-10-CM

## 2019-11-07 DIAGNOSIS — R5381 Other malaise: Secondary | ICD-10-CM

## 2019-11-07 LAB — COMPREHENSIVE METABOLIC PANEL
ALT: 96 U/L — ABNORMAL HIGH (ref 0–44)
AST: 470 U/L — ABNORMAL HIGH (ref 15–41)
Albumin: 1.6 g/dL — ABNORMAL LOW (ref 3.5–5.0)
Alkaline Phosphatase: 1347 U/L — ABNORMAL HIGH (ref 38–126)
Anion gap: 14 (ref 5–15)
BUN: 20 mg/dL (ref 8–23)
CO2: 19 mmol/L — ABNORMAL LOW (ref 22–32)
Calcium: 8.8 mg/dL — ABNORMAL LOW (ref 8.9–10.3)
Chloride: 110 mmol/L (ref 98–111)
Creatinine, Ser: 0.75 mg/dL (ref 0.44–1.00)
GFR calc Af Amer: >60 mL/min (ref >60)
GFR calc non Af Amer: >60 mL/min (ref >60)
Glucose, Bld: 81 mg/dL (ref 70–99)
Potassium: 3.9 mmol/L (ref 3.5–5.1)
Sodium: 143 mmol/L (ref 135–145)
Total Bilirubin: 3 mg/dL — ABNORMAL HIGH (ref 0.3–1.2)
Total Protein: 5.1 g/dL — ABNORMAL LOW (ref 6.5–8.1)

## 2019-11-07 LAB — CBC WITH DIFFERENTIAL/PLATELET
Abs Immature Granulocytes: 0.79 10*3/uL — ABNORMAL HIGH (ref 0.00–0.07)
Basophils Absolute: 0.1 10*3/uL (ref 0.0–0.1)
Basophils Relative: 0 %
Eosinophils Absolute: 0.2 10*3/uL (ref 0.0–0.5)
Eosinophils Relative: 1 %
HCT: 37.4 % (ref 36.0–46.0)
Hemoglobin: 11.5 g/dL — ABNORMAL LOW (ref 12.0–15.0)
Immature Granulocytes: 4 %
Lymphocytes Relative: 8 %
Lymphs Abs: 1.6 10*3/uL (ref 0.7–4.0)
MCH: 30.7 pg (ref 26.0–34.0)
MCHC: 30.7 g/dL (ref 30.0–36.0)
MCV: 99.7 fL (ref 80.0–100.0)
Monocytes Absolute: 2 10*3/uL — ABNORMAL HIGH (ref 0.1–1.0)
Monocytes Relative: 10 %
Neutro Abs: 14.4 10*3/uL — ABNORMAL HIGH (ref 1.7–7.7)
Neutrophils Relative %: 77 %
Platelets: 300 10*3/uL (ref 150–400)
RBC: 3.75 MIL/uL — ABNORMAL LOW (ref 3.87–5.11)
RDW: 26.9 % — ABNORMAL HIGH (ref 11.5–15.5)
WBC: 19.2 10*3/uL — ABNORMAL HIGH (ref 4.0–10.5)
nRBC: 2.4 % — ABNORMAL HIGH (ref 0.0–0.2)

## 2019-11-07 LAB — MAGNESIUM: Magnesium: 1.8 mg/dL (ref 1.7–2.4)

## 2019-11-07 LAB — VITAMIN B12: Vitamin B-12: 515 pg/mL (ref 180–914)

## 2019-11-07 LAB — ACTH STIMULATION, 3 TIME POINTS
Cortisol, 30 Min: 38.4 ug/dL
Cortisol, 60 Min: 47.5 ug/dL
Cortisol, Base: 30.9 ug/dL

## 2019-11-07 LAB — GLUCOSE, CAPILLARY
Glucose-Capillary: 100 mg/dL — ABNORMAL HIGH (ref 70–99)
Glucose-Capillary: 78 mg/dL (ref 70–99)
Glucose-Capillary: 89 mg/dL (ref 70–99)
Glucose-Capillary: 95 mg/dL (ref 70–99)
Glucose-Capillary: 95 mg/dL (ref 70–99)

## 2019-11-07 LAB — GAMMA GT: GGT: 687 U/L — ABNORMAL HIGH (ref 7–50)

## 2019-11-07 LAB — HEMOGLOBIN A1C
Hgb A1c MFr Bld: 4.1 % — ABNORMAL LOW (ref 4.8–5.6)
Mean Plasma Glucose: 70.97 mg/dL

## 2019-11-07 LAB — BILIRUBIN, FRACTIONATED(TOT/DIR/INDIR)
Bilirubin, Direct: 1.7 mg/dL — ABNORMAL HIGH (ref 0.0–0.2)
Indirect Bilirubin: 1.2 mg/dL — ABNORMAL HIGH (ref 0.3–0.9)
Total Bilirubin: 2.9 mg/dL — ABNORMAL HIGH (ref 0.3–1.2)

## 2019-11-07 LAB — SURGICAL PATHOLOGY

## 2019-11-07 LAB — CORTISOL-AM, BLOOD: Cortisol - AM: 28.7 ug/dL — ABNORMAL HIGH (ref 6.7–22.6)

## 2019-11-07 LAB — TSH: TSH: 1.306 u[IU]/mL (ref 0.350–4.500)

## 2019-11-07 NOTE — Progress Notes (Signed)
Physical Therapy Treatment Patient Details Name: [name] MRN: [id number] DOB: Jul 04, 1945 Today's Date: 11/07/2019    History of Present Illness Pt is a 74 y/o female admitted secondary to abdominal distention and pain. 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 Pt likely for liver biopsy on 5/14. PMH includes DM, temporal arteritis, breast cancer, OSA on CPAP, R TKA, and ACDF.     PT Comments    Pt very lethargic throughout session. Opened her eyes briefly to therapist saying her name. She was unable to appropriately answer any questions. Re-oriented pt to place and people (her husband and daughter both present). Extensive discussion with pt's husband and daughter regarding their plan for the patient, their goals and quality of life. Family expressing quality was much more important than quantity. Further discussed PT's recommendations for pt to d/c to SNF for further care versus pt returning home and requiring extensive DME and 24/7 heavy physical assistance. Pt's family wishing to pursue SNF as they do not feel they could safely or realistically manage her care upon d/c. Plan per family is for pt to transfer to [facility] on 5/24 for cancer treatment and then based on pt's response will go from there. PT will continue to follow pt acutely and adjust POC accordingly.   Follow Up Recommendations  SNF     Equipment Recommendations  Hospital bed;Other (comment)(Hoyer Lift)    Recommendations for Other Services       Precautions / Restrictions Precautions Precautions: Fall Restrictions Weight Bearing Restrictions: No    Mobility  Bed Mobility Overal bed mobility: Needs Assistance Bed Mobility: Rolling Rolling: Max assist         General bed mobility comments: max A required to roll for repositioning on R sidelying with pillows positioned under L hip and back for pressure relief  Transfers                    Ambulation/Gait                  Stairs             Wheelchair Mobility    Modified Rankin (Stroke Patients Only)       Balance                                            Cognition Arousal/Alertness: Lethargic Behavior During Therapy: Flat affect Overall Cognitive Status: Difficult to assess                                        Exercises Other Exercises Other Exercises: PROM/AAROM to bilateral LEs in supine for ankle and knee on all planes of motion. Pt able to assist more on the L Other Exercises: PROM/AAROM to bilateral UEs for forearm supination/pronation, elbow flexion/extension and shoulder scaption    General Comments        Pertinent Vitals/Pain Pain Assessment: Faces Faces Pain Scale: Hurts even more Pain Location: head Pain Descriptors / Indicators: Headache Pain Intervention(s): Monitored during session;Repositioned    Home Living                      Prior Function            PT Goals (current goals  can now be found in the care plan section) Acute Rehab PT Goals PT Goal Formulation: With patient Time For Goal Achievement: 11/13/19 Potential to Achieve Goals: Fair Progress towards PT goals: Not progressing toward goals - comment(lethargy)    Frequency    Min 2X/week      PT Plan Current plan remains appropriate    Co-evaluation              AM-PAC PT "6 Clicks" Mobility   Outcome Measure  Help needed turning from your back to your side while in a flat bed without using bedrails?: Total Help needed moving from lying on your back to sitting on the side of a flat bed without using bedrails?: Total Help needed moving to and from a bed to a chair (including a wheelchair)?: Total Help needed standing up from a chair using your arms (e.g., wheelchair or bedside chair)?: Total Help needed to walk in hospital room?: Total Help needed climbing 3-5 steps with a railing? : Total 6 Click Score: 6    End of Session    Activity Tolerance: Patient limited by lethargy Patient left: in bed;with call bell/phone within reach;with family/visitor present Nurse Communication: Mobility status PT Visit Diagnosis: Muscle weakness (generalized) (M62.81);Other abnormalities of gait and mobility (R26.89)     Time: [id number] PT Time Calculation (min) (ACUTE ONLY): 58 min  Charges:  $Therapeutic Exercise: 23-37 mins $Self Care/Home Management: 23-37                     Anastasio Champion, DPT  Acute Rehabilitation Services Pager [phone] Office [place] 11/07/2019, 4:38 PM

## 2019-11-07 NOTE — Progress Notes (Signed)
PROGRESS NOTE  [name] [id number] DOB: 23-Nov-1945   PCP: Leonides Sake, MD  Patient is from: Home  DOA: 10/29/2019 LOS: 8  Brief Narrative / Interim history: 74 year old female with history of DM-two, temporal arteritis, migraine, OSA and cervical radiculopathy admitted with waxing and waning abdominal pain for 3 months that has gotten worse over the last 3 weeks, generalized weakness, poor appetite and nausea. Presented to PCP who was concerned about cancer. She was directed to ED. In ED, found to have hepatic metastasis, omental caking, complex cystic left lower quadrant mass retroperitoneal adenopathy. Also found to have hyponatremia, hypocalcemia and AKI. Underwent liver biopsy by IR on 5/14. 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 Oncology and palliative care consulted. Oncology planning to start chemotherapy the week of 5/24. Patient will be transferred to [facility] long hospital over the weekend. Oncology also requested PICC line placement.  Subjective: Seen and examined earlier this morning. No major events overnight or this morning. She says, "I am worried". Husband at bedside. Very supportive. He is also tearful. She denies pain or other discomfort. She is still not coherent.   Objective: Vitals:   11/06/19 2129 11/07/19 0143 11/07/19 0423 11/07/19 0853  BP: (!) 106/53 (!) 99/53 106/61 114/63  Pulse: 96 94 94 96  Resp: 14 14 16 16   Temp: 97.6 F (36.4 C) 97.7 F (36.5 C) 98.2 F (36.8 C) 98.4 F (36.9 C)  TempSrc: Oral Oral    SpO2: 99% 100% 98% 99%  Weight: 120.6 kg     Height:        Intake/Output Summary (Last 24 hours) at 11/07/2019 1407 Last data filed at 11/07/2019 1400 Gross per 24 hour  Intake 1848.55 ml  Output 625 ml  Net 1223.55 ml   Filed Weights   11/04/19 2104 11/06/19 0517 11/06/19 2129  Weight: 115.1 kg 116.3 kg 120.6 kg    Examination:  GENERAL: No apparent distress.   Nontoxic. HEENT: MMM.  Vision and hearing grossly intact.  NECK: Supple.  No apparent JVD.  RESP: 98% on room air. No IWOB.  Fair aeration bilaterally. CVS:  RRR. Heart sounds normal.  ABD/GI/GU: BS+. Abd soft, NTND.  MSK/EXT:  Moves extremities. No apparent deformity. No edema.  SKIN: no apparent skin lesion or wound NEURO: Awake, alert and oriented appropriately.  No apparent focal neuro deficit. PSYCH: Calm. Normal affect.  Procedures:  5/14-liver biopsy by IR. Pathology result most consistent with primary ovarian or  primary peritoneal serous carcinoma.  Microbiology summarized: COVID-19 PCR negative.  Assessment & Plan: Metastatic primary peritoneal serous carcinoma- Liver biopsy on 5/14 reported as "most consistent with primary ovarian or  primary peritoneal serous carcinom". Oncology suspects the later. -Plan to transfer to [facility], oncology floor in anticipation for chemo beginning 5/24 -PICC line placement ordered.  Acute metabolic encephalopathy: Hepatic encephalopathy? Ammonia 61 >> 86> 58.  MRI brain, ABG, a.m. cortisol and ACTH stimulation test unrevealing. Has leukocytosis likely from recent steroid. No focal neuro deficits. -Check TSH and vitamin B12 level -Continue lactulose and adjust dose as appropriate -Frequent orientation and delirium precautions.  Volume depletion/AKI/hyperkalemia: Resolved. -Encourage hydration  Controlled DM-2? Does not seem to be on medication at home. Recent Labs    11/07/19 0526 11/07/19 0821 11/07/19 1159  GLUCAP 95 89 95  -Discontinue CBG monitoring -Check hemoglobin A1c  Hypercalcemia-likely due to dehydration. Resolved.  Elevated liver enzymes/hyperbilirubinemia/elevated alkaline phosphatase -Check GGT and CK -Fractionate bili -Continue monitoring  History of temporal arteritis: Stable -Just came off steroids recently.  Leukocytosis/bandemia: Due to steroid? -Continue monitoring.  Morbid obesity: Body  mass index is 45.64 kg/m.  Goal of care/DNR/DNI-appropriate. Initially plan was to discharge to [facility] but oncology would like to start chemotherapy now. -Palliative medicine following.  Nutrition Problem: Inadequate oral intake Etiology: poor appetite Signs/Symptoms: per patient/family report Interventions: Ensure Enlive (each supplement provides 350kcal and 20 grams of protein), MVI Pressure Injury 11/06/19 Sacrum Stage 2 -  Partial thickness loss of dermis presenting as a shallow open injury with a red, pink wound bed without slough. (Active)  11/06/19 1500  Location: Sacrum  Location Orientation:   Staging: Stage 2 -  Partial thickness loss of dermis presenting as a shallow open injury with a red, pink wound bed without slough.  Wound Description (Comments):   Present on Admission:    DVT prophylaxis: Start subcu Lovenox Code Status: DNR/DNI Family Communication: Updated patient's husband at bedside. Status is: Inpatient  Remains inpatient appropriate because:IV treatments appropriate due to intensity of illness or inability to take PO and Inpatient level of care appropriate due to severity of illness   Dispo: The patient is from: Home              Anticipated d/c is to: To be determined              Anticipated d/c date is: > 3 days              Patient currently is not medically stable to d/c.        Consultants:  Oncology Palliative medicine   Sch Meds:  Scheduled Meds: . citalopram  10 mg Oral Daily  . feeding supplement (ENSURE ENLIVE)  237 mL Oral TID BM  . fluticasone  1 spray Each Nare BID  . insulin aspart  0-9 Units Subcutaneous Q4H  . lactulose  20 g Oral TID  . nystatin  5 mL Mouth/Throat QID  . nystatin   Topical TID  . pantoprazole  40 mg Oral Q supper   Continuous Infusions: . sodium chloride 75 mL/hr at 11/07/19 1200   PRN Meds:.acetaminophen **OR** acetaminophen, ondansetron **OR** ondansetron (ZOFRAN)  IV  Antimicrobials: Anti-infectives (From admission, onward)   Start     Dose/Rate Route Frequency Ordered Stop   11/01/19 1615  fluconazole (DIFLUCAN) tablet 150 mg     150 mg Oral  Once 11/01/19 1609 11/01/19 1713       I have personally reviewed the following labs and images: CBC: Recent Labs  Lab 11/03/19 0448 11/04/19 0604 11/05/19 0608 11/06/19 0449 11/07/19 0801  WBC 13.8* 15.1* 15.1* 17.8* 19.2*  NEUTROABS 10.1* 11.6* 12.0* 13.8* 14.4*  HGB 11.2* 11.4* 12.7 10.9* 11.5*  HCT 36.8 36.7 41.4 35.2* 37.4  MCV 98.1 97.9 98.3 98.3 99.7  PLT 322 307 279 319 300   BMP &GFR Recent Labs  Lab 11/03/19 1512 11/04/19 0604 11/05/19 0608 11/06/19 0449 11/07/19 0457  NA 138 139 143 141 143  K 4.4 4.1 5.1 4.3 3.9  CL 108 107 109 110 110  CO2 21* 21* 22 20* 19*  GLUCOSE 100* 97 93 109* 81  BUN 15 14 14 20 20   CREATININE 0.75 0.66 0.73 0.89 0.75  CALCIUM 10.8* 10.1 9.6 8.9 8.8*  MG  --   --   --   --  1.8   Estimated Creatinine Clearance: 81.4 mL/min (by C-G formula based on SCr of 0.75 mg/dL). Liver & Pancreas: Recent  Labs  Lab 11/03/19 1512 11/04/19 0604 11/05/19 0608 11/06/19 0449 11/07/19 0457  AST 212* 212* 251* 502* 470*  ALT 42 43 51* 89* 96*  ALKPHOS 1,408* 1,396* 1,346* 1,285* 1,347*  BILITOT 2.4* 2.5* 2.7* 3.0* 3.0*  PROT 5.1* 5.3* 5.5* 5.2* 5.1*  ALBUMIN 1.6* 1.6* 1.7* 1.6* 1.6*   No results for input(s): LIPASE, AMYLASE in the last 168 hours. Recent Labs  Lab 11/01/19 0922 11/02/19 0339 11/03/19 0448 11/05/19 0608 11/06/19 0449  AMMONIA 61* 76* 86* 52* 58*   Diabetic: No results for input(s): HGBA1C in the last 72 hours. Recent Labs  Lab 11/07/19 0005 11/07/19 0410 11/07/19 0526 11/07/19 0821 11/07/19 1159  GLUCAP 100* 78 95 89 95   Cardiac Enzymes: No results for input(s): CKTOTAL, CKMB, CKMBINDEX, TROPONINI in the last 168 hours. No results for input(s): PROBNP in the last 8760 hours. Coagulation Profile: Recent Labs  Lab  11/02/19 0339 11/06/19 0449  INR 1.1 1.3*   Thyroid Function Tests: No results for input(s): TSH, T4TOTAL, FREET4, T3FREE, THYROIDAB in the last 72 hours. Lipid Profile: No results for input(s): CHOL, HDL, LDLCALC, TRIG, CHOLHDL, LDLDIRECT in the last 72 hours. Anemia Panel: No results for input(s): VITAMINB12, FOLATE, FERRITIN, TIBC, IRON, RETICCTPCT in the last 72 hours. Urine analysis:    Component Value Date/Time   COLORURINE AMBER (A) 06/18/2019 1230   APPEARANCEUR CLOUDY (A) 06/18/2019 1230   LABSPEC 1.026 06/18/2019 1230   PHURINE 6.0 06/18/2019 1230   GLUCOSEU >=500 (A) 06/18/2019 1230   HGBUR SMALL (A) 06/18/2019 1230   BILIRUBINUR NEGATIVE 06/18/2019 1230   KETONESUR 5 (A) 06/18/2019 1230   PROTEINUR 30 (A) 06/18/2019 1230   UROBILINOGEN 1.0 07/11/2012 1023   NITRITE POSITIVE (A) 06/18/2019 1230   LEUKOCYTESUR LARGE (A) 06/18/2019 1230   Sepsis Labs: Invalid input(s): PROCALCITONIN, [place]  Microbiology: Recent Results (from the past 240 hour(s))  SARS Coronavirus 2 by RT PCR (hospital order, performed in [facility] hospital lab) Nasopharyngeal Nasopharyngeal Swab     Status: None   Collection Time: 10/29/19 10:49 PM   Specimen: Nasopharyngeal Swab  Result Value Ref Range Status   SARS Coronavirus 2 NEGATIVE NEGATIVE Final    Comment: (NOTE) SARS-CoV-2 target nucleic acids are NOT DETECTED. The SARS-CoV-2 RNA is generally detectable in upper and lower respiratory specimens during the acute phase of infection. The lowest concentration of SARS-CoV-2 viral copies this assay can detect is 250 copies / mL. A negative result does not preclude SARS-CoV-2 infection and should not be used as the sole basis for treatment or other patient management decisions.  A negative result may occur with improper specimen collection / handling, submission of specimen other than nasopharyngeal swab, presence of viral mutation(s) within the areas targeted by this assay, and  inadequate number of viral copies (<250 copies / mL). A negative result must be combined with clinical observations, patient history, and epidemiological information. Fact Sheet for Patients:   StrictlyIdeas.no Fact Sheet for Healthcare Providers: BankingDealers.co.za This test is not yet approved or cleared  by the [place] FDA and has been authorized for detection and/or diagnosis of SARS-CoV-2 by FDA under an Emergency Use Authorization (EUA).  This EUA will remain in effect (meaning this test can be used) for the duration of the COVID-19 declaration under Section 564(b)(1) of the Act, 21 U.S.C. section 360bbb-3(b)(1), unless the authorization is terminated or revoked sooner. Performed at [facility], [place] [address] [place], [place] [address]     Radiology Studies:  VAS [place] UPPER EXTREMITY VENOUS DUPLEX  Result Date: 11/07/2019 UPPER VENOUS STUDY  Indications: Edema Limitations: Body habitus, poor ultrasound/tissue interface and patient unable to cooperate. Patient position. Comparison Study: No prior study Performing Technologist: Maudry Mayhew MHA, RDMS, RVT, RDCS  Examination Guidelines: A complete evaluation includes B-mode imaging, spectral Doppler, color Doppler, and power Doppler as needed of all accessible portions of each vessel. Bilateral testing is considered an integral part of a complete examination. Limited examinations for reoccurring indications may be performed as noted.  Right Findings: +----------+------------+---------+-----------+----------+--------------+ RIGHT     CompressiblePhasicitySpontaneousProperties   Summary     +----------+------------+---------+-----------+----------+--------------+ Subclavian                                          Not visualized +----------+------------+---------+-----------+----------+--------------+  Left Findings:  +----------+------------+---------+-----------+----------+--------------+ LEFT      CompressiblePhasicitySpontaneousProperties   Summary     +----------+------------+---------+-----------+----------+--------------+ IJV           Full       Yes       Yes                             +----------+------------+---------+-----------+----------+--------------+ Subclavian    Full       Yes       Yes                             +----------+------------+---------+-----------+----------+--------------+ Axillary      Full       Yes       Yes                             +----------+------------+---------+-----------+----------+--------------+ Brachial      Full       Yes       Yes                             +----------+------------+---------+-----------+----------+--------------+ Radial        Full                                                 +----------+------------+---------+-----------+----------+--------------+ Ulnar                                               Not visualized +----------+------------+---------+-----------+----------+--------------+ Cephalic      Full                                                 +----------+------------+---------+-----------+----------+--------------+ Basilic                                             Not visualized +----------+------------+---------+-----------+----------+--------------+  Summary:  Left: No evidence of deep vein thrombosis in the upper extremity.  No evidence of superficial vein thrombosis in the upper extremity. This study was limited due to technical limitations listed above.  *See table(s) above for measurements and observations.    Preliminary     45 minutes with more than 50% spent in reviewing records, counseling patient/family and coordinating care.   Warrene Kapfer T. [place]  If 7PM-7AM, please contact night-coverage [email or web address] Password [facility] 11/07/2019, 2:07 PM

## 2019-11-07 NOTE — TOC Progression Note (Signed)
Transition of Care [facility]) - Progression Note    Patient Details  Name: [name] MRN: [id number] Date of Birth: 15-Jan-1946  Transition of Care [facility]) CM/SW Contact  Sharlet Salina Mila Homer, LCSW Phone Number: 11/07/2019, 7:10 PM  Clinical Narrative: Initial discharge plan was SNF and facility search initiated and insurance authorization requested, with facility choice pending.  CSW received call from [place] with [employer] today (3:47 pm) regarding patient and authorization request. Patient's medical readiness discussed and at time of conversation, MD did not have a progress note in. Per Eustaquio Maize, insurance auth request will be withdrawn at this time as patient not medically ready and no facility chosen. Another request can be submitted once patient medically stable per [facility]. CSW will continue to follow and provide SW intervention services as needed and/or requested.       Expected Discharge Plan: [place] Barriers to Discharge: Continued Medical Work up  Expected Discharge Plan and Services Expected Discharge Plan: [place] arrangements for the past 2 months: Single Family Home                                       Social Determinants of Health (SDOH) Interventions    Readmission Risk Interventions No flowsheet data found.

## 2019-11-07 NOTE — Consult Note (Signed)
[facility] CM Inpatient Consult   11/07/2019  [name] 03/16/1946 [id number]  [facility] ACO Patient:  [employer] Medicare  Primary Care Provider is  Maura Hamrick, [facility]  Patient screened for high risk score for unplanned readmission and for length of stay 8 days noted.  Also, reviewed to check if potential [facility] Care Management services are needed.  Review of patient's medical record reveals patient is is for transitioning to hospice care [home verse facility noted],with review of inpatient Transition of Care team member documentation and Palliative referral.  Plan:  Patient's post hospital care management needs will be met at the hospice level of care.  Will sign off at disposition/transition  For questions contact:    , RN BSN CCM [facility] Hospital Liaison  [phone] business mobile phone Toll free office [phone]  Fax number: [phone] [email or web address]       

## 2019-11-07 NOTE — Progress Notes (Signed)
Received a call from MD [facility] regarding pt.'s result of stat VAS US venous duplex of Left UE, informed him that procedure wasn't done yet. asked to pass to AM shift nurse early  in the morning to give a call to [facility] to remind them of the STAT order, give MD Samtani a call when result is in.

## 2019-11-07 NOTE — Progress Notes (Signed)
Attempt to place PICC unsuccessful.  Attempt twice in  right cephalic and basilic veins.  Able to thread into each vein but not able to thread centrally meeting resistance. Attempted various techniques including use to a radiology wire. Left arm restricted.  If PICC still desired recommend referral to interventional radiology.

## 2019-11-07 NOTE — Progress Notes (Addendum)
HEMATOLOGY-ONCOLOGY PROGRESS NOTE  SUBJECTIVE: [name] is more awake today.  Husband and daughter at the bedside.  She has been able to take in some Ensure today and some bites of lunch.  She has not had any nausea or vomiting.  She is not currently complaining of any abdominal pain.  PHYSICAL EXAMINATION:  Vitals:   11/07/19 0423 11/07/19 0853  BP: 106/61 114/63  Pulse: 94 96  Resp: 16 16  Temp: 98.2 F (36.8 C) 98.4 F (36.9 C)  SpO2: 98% 99%   Filed Weights   11/04/19 2104 11/06/19 0517 11/06/19 2129  Weight: 115.1 kg 116.3 kg 120.6 kg    Intake/Output from previous day: 05/20 0701 - 05/21 0700 In: 1147.3 [P.O.:260; I.V.:887.3] Out: 750 [Urine:750]  Lungs: Clear anteriorly Cardiac: Regular rate and rhythm ABDOMEN: Positive bowel sounds, abdomen nontender, hepatomegaly noted NEURO: alert, oriented to place.  Not oriented to year or situation. Vascular: Pitting edema at the feet bilaterally  LABORATORY DATA:  I have reviewed the data as listed CMP Latest Ref Rng & Units 11/07/2019 11/06/2019 11/05/2019  Glucose 70 - 99 mg/dL 81 109(H) 93  BUN 8 - 23 mg/dL 20 20 14   Creatinine 0.44 - 1.00 mg/dL 0.75 0.89 0.73  Sodium 135 - 145 mmol/L 143 141 143  Potassium 3.5 - 5.1 mmol/L 3.9 4.3 5.1  Chloride 98 - 111 mmol/L 110 110 109  CO2 22 - 32 mmol/L 19(L) 20(L) 22  Calcium 8.9 - 10.3 mg/dL 8.8(L) 8.9 9.6  Total Protein 6.5 - 8.1 g/dL 5.1(L) 5.2(L) 5.5(L)  Total Bilirubin 0.3 - 1.2 mg/dL 3.0(H) 3.0(H) 2.7(H)  Alkaline Phos 38 - 126 U/L 1,347(H) 1,285(H) 1,346(H)  AST 15 - 41 U/L 470(H) 502(H) 251(H)  ALT 0 - 44 U/L 96(H) 89(H) 51(H)    Lab Results  Component Value Date   WBC 19.2 (H) 11/07/2019   HGB 11.5 (L) 11/07/2019   HCT 37.4 11/07/2019   MCV 99.7 11/07/2019   PLT 300 11/07/2019   NEUTROABS 14.4 (H) 11/07/2019    CT CHEST WO CONTRAST  Result Date: 10/30/2019 CLINICAL DATA:  Metastatic malignancy EXAM: CT CHEST WITHOUT CONTRAST TECHNIQUE: Multidetector CT  imaging of the chest was performed following the standard protocol without IV contrast. COMPARISON:  10/29/2019, 06/18/2019 FINDINGS: Cardiovascular: No pericardial effusion. Normal caliber of the thoracic aorta. Minimal atherosclerosis. Mediastinum/Nodes: Numerous enlarged mediastinal lymph nodes are identified. Index lymph node in the right paratracheal region measures 21 x 21 mm, previous having measured 27 x 22 mm. Increased number of enlarged lymph nodes throughout the mediastinum compared to prior neck CT. The trachea, esophagus, and thyroid are unremarkable. Lungs/Pleura: There is a small spiculated sub solid nodule in the right upper lobe image 35, measuring 5 mm. No other pulmonary nodules or masses. Mild upper lobe predominant emphysema. Hypoventilatory changes at the lung bases. No large effusion or pneumothorax. Upper Abdomen: Please refer to recent abdominal CT describing numerous hepatic metastases as well as omental caking and trace ascites. Musculoskeletal: There are no acute or destructive bony lesions. Reconstructed images demonstrate no additional findings. IMPRESSION: 1. Nonspecific sub solid 5 mm right upper lobe pulmonary nodule. Neoplasm cannot be excluded in light of findings elsewhere within the chest and abdomen. 2. Extensive mediastinal lymphadenopathy worrisome for nodal metastases. 3. Aortic Atherosclerosis (ICD10-I70.0) and Emphysema (ICD10-J43.9). 4. Please refer to recent abdominal CT describing diffuse hepatic metastases. Electronically Signed   By: Randa Ngo M.D.   On: 10/30/2019 21:56   MR BRAIN W WO CONTRAST  Result Date: 10/31/2019 CLINICAL DATA:  Weakness, metastases EXAM: MRI HEAD WITHOUT AND WITH CONTRAST TECHNIQUE: Multiplanar, multiecho pulse sequences of the brain and surrounding structures were obtained without and with intravenous contrast. CONTRAST:  73m GADAVIST GADOBUTROL 1 MMOL/ML IV SOLN COMPARISON:  09/30/2018 FINDINGS: Motion artifact is present Brain:  There is no acute infarction or intracranial hemorrhage. There is no intracranial mass, mass effect, or edema. There is no hydrocephalus or extra-axial fluid collection. Ventricles and sulci are stable in size and configuration. Patchy T2 hyperintensity in the supratentorial and pontine white matter is nonspecific but probably reflects mild chronic microvascular ischemic changes. No abnormal enhancement. Vascular: Major vessel flow voids at the skull base are preserved. Skull and upper cervical spine: Normal marrow signal is preserved. There is susceptibility artifact related to partially imaged cervical spine fusion. Sinuses/Orbits: Paranasal sinuses are aerated. Orbits are unremarkable. Other: Sella is unremarkable.  Mastoid air cells are clear. IMPRESSION: Suboptimal evaluation due to motion artifact. No evidence of intracranial metastatic disease. No acute infarction or hemorrhage. Electronically Signed   By: PMacy MisM.D.   On: 10/31/2019 14:24   CT ABDOMEN PELVIS W CONTRAST  Result Date: 10/29/2019 CLINICAL DATA:  Hyperkalemia,, liver disease, emesis, weight loss EXAM: CT ABDOMEN AND PELVIS WITH CONTRAST TECHNIQUE: Multidetector CT imaging of the abdomen and pelvis was performed using the standard protocol following bolus administration of intravenous contrast. CONTRAST:  746mOMNIPAQUE IOHEXOL 300 MG/ML  SOLN COMPARISON:  06/17/2019, 01/05/2006 FINDINGS: Lower chest: No acute pleural or parenchymal lung disease. Hepatobiliary: There are innumerable masses replacing the majority of the liver parenchyma consistent with diffuse metastatic disease. The gallbladder is surgically absent. Pancreas: Unremarkable. No pancreatic ductal dilatation or surrounding inflammatory changes. Spleen: Normal in size without focal abnormality. Adrenals/Urinary Tract: Adrenal glands are unremarkable. Kidneys are normal, without renal calculi, focal lesion, or hydronephrosis. Bladder is unremarkable. Stomach/Bowel: No  bowel obstruction or ileus. No bowel wall thickening or inflammatory changes. Vascular/Lymphatic: There is pathologic adenopathy within the retroperitoneum. Largest pathologic lymph node in the left external iliac chain reference image 77 measures 3.5 x 3.1 cm. A second index lymph node at the right iliac bifurcation measures 2.6 x 2.2 cm reference image 62. There is minimal atherosclerosis of the aorta. Reproductive: Uterus is surgically absent. I do not see any adnexal lesions. Other: There is soft tissue fat stranding throughout the ventral omentum consistent with omental caking. Complex cystic mass within the left lower quadrant along the anterior abdominal wall measuring 5.7 x 7.2 cm is most consistent with a necrotic mass. Mural thickening along the anterior and lateral margin of the mass. There is trace free fluid within the upper abdomen surrounding the liver and spleen. No free intra-abdominal gas. Musculoskeletal: There are no acute or destructive bony lesions. Reconstructed images demonstrate no additional findings. IMPRESSION: 1. Diffuse hepatic metastases. 2. Omental caking. Complex cystic mass left lower quadrant consistent with necrotic lesion. 3. Pathologic retroperitoneal lymphadenopathy, most pronounced within the iliac distributions left greater than right. 4. Trace ascites. Electronically Signed   By: MiRanda Ngo.D.   On: 10/29/2019 22:15   [place]OPSY (LIVER)  Result Date: 10/31/2019 INDICATION: [age]ear old female with multifocal hepatic lesions concerning for metastatic disease. She presents for ultrasound-guided biopsy of the same. EXAM: ULTRASOUND BIOPSY CORE LIVER MEDICATIONS: None. ANESTHESIA/SEDATION: Moderate (conscious) sedation was employed during this procedure. A total of Versed 1 mg and Fentanyl 50 mcg was administered intravenously. Moderate Sedation Time: 13 minutes. The patient's level of consciousness and vital signs  were monitored continuously by radiology nursing  throughout the procedure under my direct supervision. FLUOROSCOPY TIME:  None. COMPLICATIONS: None immediate. PROCEDURE: Informed written consent was obtained from the patient after a thorough discussion of the procedural risks, benefits and alternatives. All questions were addressed. A timeout was performed prior to the initiation of the procedure. The liver was interrogated with ultrasound. There are innumerable hypoechoic solid lesions scattered throughout the liver. A suitable lesion in the left hepatic lobe was identified. The overlying skin was sterilely prepped and draped in the standard fashion. Local anesthesia was attained by infiltration with 1% lidocaine. A small dermatotomy was made. Under real-time ultrasound guidance, multiple 18 gauge core biopsies were obtained using the bio Pince automated biopsy device. Biopsies were performed coaxially through a 17 gauge introducer needle. As the introducer needle was withdrawn, the biopsy tract was embolized with a Gel-Foam slurry. Post ultrasound imaging demonstrates no evidence of immediate complication. IMPRESSION: Technically successful ultrasound-guided core biopsy of liver lesion. Electronically Signed   By: Jacqulynn Cadet M.D.   On: 10/31/2019 12:09   DG CHEST PORT 1 VIEW  Result Date: 11/05/2019 CLINICAL DATA:  Acute respiratory distress EXAM: PORTABLE CHEST 1 VIEW COMPARISON:  06/17/2019 FINDINGS: Single frontal view of the chest demonstrates an enlarged cardiac silhouette. Lung volumes are diminished. Mild increased interstitial prominence with central vascular congestion consistent with mild fluid overload. Small right pleural effusion. No pneumothorax. IMPRESSION: 1. Findings suggesting mild interstitial edema. Electronically Signed   By: Randa Ngo M.D.   On: 11/05/2019 22:51   DG Swallowing Func-Speech Pathology  Result Date: 11/06/2019 Objective Swallowing Evaluation: Type of Study: MBS-Modified Barium Swallow Study  Patient Details  Name: [name] MRN: [id number] Date of Birth: August 23, 1945 Today's Date: 11/06/2019 Time: SLP Start Time (ACUTE ONLY): 31 -SLP Stop Time (ACUTE ONLY): 1300 SLP Time Calculation (min) (ACUTE ONLY): 30 min Past Medical History: Past Medical History: Diagnosis Date . ADD (attention deficit disorder)  . Anxiety  . Arthritis   os . Breast cancer ([place])  . Diabetes mellitus without complication ([place])  . GERD (gastroesophageal reflux disease)   barretts . Headache  . High cholesterol  . Neck pain  . Obesity  . Personal history of radiation therapy 2005 . Sleep apnea   cpap   last sleep study> 3 yrs . Swelling of both ankles  . Weakness 06/2019 Past Surgical History: Past Surgical History: Procedure Laterality Date . ABDOMINAL HYSTERECTOMY   . ANTERIOR CERVICAL DECOMP/DISCECTOMY FUSION N/A 02/15/2016  Procedure: Cervical three four-Cervical five-six, Cervical six-seven Anterior cervical decompression/diskectomy/fusion;  Surgeon: Kristeen Miss, MD;  Location: [facility];  Service: Neurosurgery;  Laterality: N/A; . bicept surgery   . BREAST LUMPECTOMY   . CHOLECYSTECTOMY   . JOINT REPLACEMENT   . PAROTID GLAND TUMOR EXCISION   . TONSILLECTOMY   . TOTAL KNEE ARTHROPLASTY  07/19/2012  Procedure: TOTAL KNEE ARTHROPLASTY;  Surgeon: Yvette Rack., MD;  Location: [place];  Service: Orthopedics;  Laterality: Right;  right total knee arthroplasty . TUBAL LIGATION   HPI: Pt is a 74 year old female admitted with medical decline and abnormal labs, found to have signs of malignant breast cancer with hepatic metastases. Admitted with subacute abdominal pain waxing waning over 3 months more acutely 3 weeks ago developed worsening weakness poor appetite nausea.  Has had difficulty swallowing with some question of a neck mass and thrust. CT shows Extensive mediastinal lymphadenopathy worrisome for nodal metastases.  No data recorded Assessment / Plan /  Recommendation CHL IP CLINICAL IMPRESSIONS 11/06/2019 Clinical Impression  Pt  demonstrates mild oropharyngeal dysphagia, appearing primarily due to decreased awareness and mentation. Pt kept her eyes closed during session but followed commands as needed, When taking sips, there were instances of brief oral holding and also slight premature spillage. Airway closure was not complete at times during the swallow leading to instances of flash frank penetration. Typically no cough was elicited during these events, but as study progressed pt coughed more and mroe often, not appearing to be related to pharyngeal impairment. Esophageal sweep showed some distal residue and pill needed several bolus to fully clear. Pt is capable of regular solids and thin liquids but she should only eat and drink when fully alert. Will f/u for tolerance of diet and further modifications as needed.  SLP Visit Diagnosis Dysphagia, unspecified (R13.10) Attention and concentration deficit following -- Frontal lobe and executive function deficit following -- Impact on safety and function Mild aspiration risk;Risk for inadequate nutrition/hydration   CHL IP TREATMENT RECOMMENDATION 11/06/2019 Treatment Recommendations Therapy as outlined in treatment plan below   No flowsheet data found. CHL IP DIET RECOMMENDATION 11/06/2019 SLP Diet Recommendations -- Liquid Administration via Cup;Straw Medication Administration Whole meds with liquid Compensations -- Postural Changes Seated upright at 90 degrees;Remain semi-upright after after feeds/meals (Comment)   No flowsheet data found.  CHL IP FOLLOW UP RECOMMENDATIONS 11/06/2019 Follow up Recommendations 24 hour supervision/assistance   CHL IP FREQUENCY AND DURATION 11/06/2019 Speech Therapy Frequency (ACUTE ONLY) min 1 x/week Treatment Duration 1 week      CHL IP ORAL PHASE 11/06/2019 Oral Phase Impaired Oral - Pudding Teaspoon -- Oral - Pudding Cup -- Oral - Honey Teaspoon -- Oral - Honey Cup -- Oral - Nectar Teaspoon -- Oral - Nectar Cup -- Oral - Nectar Straw -- Oral - Thin Teaspoon  Premature spillage;Decreased bolus cohesion Oral - Thin Cup -- Oral - Thin Straw Delayed oral transit Oral - Puree Delayed oral transit Oral - Mech Soft -- Oral - Regular Delayed oral transit Oral - Multi-Consistency -- Oral - Pill -- Oral Phase - Comment --  CHL IP PHARYNGEAL PHASE 11/06/2019 Pharyngeal Phase Impaired Pharyngeal- Pudding Teaspoon -- Pharyngeal -- Pharyngeal- Pudding Cup -- Pharyngeal -- Pharyngeal- Honey Teaspoon -- Pharyngeal -- Pharyngeal- Honey Cup -- Pharyngeal -- Pharyngeal- Nectar Teaspoon -- Pharyngeal -- Pharyngeal- Nectar Cup -- Pharyngeal -- Pharyngeal- Nectar Straw -- Pharyngeal -- Pharyngeal- Thin Teaspoon Penetration/Aspiration before swallow;Penetration/Aspiration during swallow Pharyngeal Material enters airway, remains ABOVE vocal cords then ejected out Pharyngeal- Thin Cup -- Pharyngeal -- Pharyngeal- Thin Straw Penetration/Aspiration before swallow;Penetration/Aspiration during swallow Pharyngeal Material enters airway, CONTACTS cords and then ejected out;Material enters airway, remains ABOVE vocal cords then ejected out;Material does not enter airway Pharyngeal- Puree WFL Pharyngeal -- Pharyngeal- Mechanical Soft -- Pharyngeal -- Pharyngeal- Regular WFL Pharyngeal -- Pharyngeal- Multi-consistency -- Pharyngeal -- Pharyngeal- Pill WFL Pharyngeal -- Pharyngeal Comment --  No flowsheet data found. DeBlois, Katherene Ponto 11/06/2019, 2:48 PM              VAS [place] UPPER EXTREMITY VENOUS DUPLEX  Result Date: 11/07/2019 UPPER VENOUS STUDY  Indications: Edema Limitations: Body habitus, poor ultrasound/tissue interface and patient unable to cooperate. Patient position. Comparison Study: No prior study Performing Technologist: Maudry Mayhew MHA, RDMS, RVT, RDCS  Examination Guidelines: A complete evaluation includes B-mode imaging, spectral Doppler, color Doppler, and power Doppler as needed of all accessible portions of each vessel. Bilateral testing is considered an integral part  of a complete  examination. Limited examinations for reoccurring indications may be performed as noted.  Right Findings: +----------+------------+---------+-----------+----------+--------------+ RIGHT     CompressiblePhasicitySpontaneousProperties   Summary     +----------+------------+---------+-----------+----------+--------------+ Subclavian                                          Not visualized +----------+------------+---------+-----------+----------+--------------+  Left Findings: +----------+------------+---------+-----------+----------+--------------+ LEFT      CompressiblePhasicitySpontaneousProperties   Summary     +----------+------------+---------+-----------+----------+--------------+ IJV           Full       Yes       Yes                             +----------+------------+---------+-----------+----------+--------------+ Subclavian    Full       Yes       Yes                             +----------+------------+---------+-----------+----------+--------------+ Axillary      Full       Yes       Yes                             +----------+------------+---------+-----------+----------+--------------+ Brachial      Full       Yes       Yes                             +----------+------------+---------+-----------+----------+--------------+ Radial        Full                                                 +----------+------------+---------+-----------+----------+--------------+ Ulnar                                               Not visualized +----------+------------+---------+-----------+----------+--------------+ Cephalic      Full                                                 +----------+------------+---------+-----------+----------+--------------+ Basilic                                             Not visualized +----------+------------+---------+-----------+----------+--------------+  Summary:  Left: No evidence of deep  vein thrombosis in the upper extremity. No evidence of superficial vein thrombosis in the upper extremity. This study was limited due to technical limitations listed above.  *See table(s) above for measurements and observations.    Preliminary     ASSESSMENT AND PLAN: 1.  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  Primary peritoneal carcinoma favored given history of hysterectomy with negative pathology 2.  Elevated total bilirubin and LFTs 3.  AKI secondary to dehydration 4.  Hypercalcemia-status post Zometa and calcitonin, improved 5.  Deconditioning 6.  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  Diabetes mellitus 8.  Temporal arteritis, status post treatment 9.  Migraine headaches 10.  Leg weakness-steroid myopathy?,  Paraneoplastic syndrome?  Ms Bixby appears more alert today.  Received a call from pathology earlier today who indicates that her liver biopsy results are consistent with ovarian cancer versus a primary peritoneal carcinoma.  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  The diagnosis was discussed with the patient and her family members.  We discussed that treatment would not be curative but instead would be palliative.  Treatment would consist of systemic chemotherapy consisting of carboplatin for an AUC of 2 and paclitaxel 60 mg/m.  This would be given weekly.  Adverse effects of the treatment have been discussed with the patient and her family members including but not limited to nausea, vomiting, myelosuppression, peripheral neuropathy, liver and kidney dysfunction.  They are also given the option of palliative care/hospice and focusing on comfort.  The family asked appropriate questions.  They are inclined to try  chemotherapy and then if she does not show any improvement, would consider transitioning to hospice at that point in time.   Recommendations: 1.  Await CA-125 result 2.  Recommend transfer to [facility], 6 floor oncology unit in anticipation of chemotherapy to begin on Monday, 11/10/2019. 3.  Will need PICC line placement prior to chemotherapy administration on Monday.  I have communicated our recommendation to transfer the patient to [facility] hospital sometime this weekend and for PICC line placement prior to Monday to her hospitalist who will help facilitate this.   LOS: 8 days   Mikey Bussing 11/07/19  Ms. Peters was interviewed and examined.  She was alert when I saw her this morning, but remained confused.  I received the final pathology result this afternoon.  The tumor is positive for WT 1 and p53.  The pathologist indicates the histology and immunohistochemical pattern are consistent with ovarian cancer or primary peritoneal carcinoma.  Her ovaries were removed in the remote past and were benign.  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  I discussed the diagnosis and treatment options with her husband.  He and his daughter would like to proceed with a trial of chemotherapy.  They understand Ms. Advani is at increased risk for toxicity given her poor performance status and abnormal liver function studies.  We will consider treatment with dose reduced Taxol/carboplatin, potentially on a weekly schedule.  She will be transferred to [facility] in anticipation of beginning chemotherapy early next week.

## 2019-11-07 NOTE — Plan of Care (Signed)
  Problem: Clinical Measurements: Goal: Diagnostic test results will improve Outcome: Progressing   Problem: Activity: Goal: Risk for activity intolerance will decrease Outcome: Progressing   Problem: Nutrition: Goal: Adequate nutrition will be maintained Outcome: Progressing   

## 2019-11-07 NOTE — Progress Notes (Signed)
Nutrition Follow-up  DOCUMENTATION CODES:   Morbid obesity  INTERVENTION:  Continue Ensure Enlive po TID, each supplement provides 350 kcal and 20 grams of protein  Await decision regarding GOC. If pt continues to have poor PO intake and if aligned with GOC, recommend initiation of nutrition support   NUTRITION DIAGNOSIS:   Inadequate oral intake related to poor appetite as evidenced by per patient/family report.  Ongoing.  GOAL:   Patient will meet greater than or equal to 90% of their needs  Not met.   MONITOR:   PO intake, Supplement acceptance, Weight trends, Labs, I & O's  REASON FOR ASSESSMENT:   Malnutrition Screening Tool    ASSESSMENT:   Pt presented with a 3 month h/o abdominal pain and a 3 week h/o generalized weakness, anorexia, N/V. 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  5/12 CT abdomen showed diffuse hepatic metastases, omental caking with a complex cystic mass in the LLQ consistent with necrotic lesion, pathologic retroperitoneal lumphadenopathy, trace ascites.  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 Per MD, pt's functional status is likely too poor for chemotherapy; however, the final decision on GOC will be determined within the next couple of days pending any improvements in mental/functional status. Currently, the plan is for the pt to discharge to hospice of [facility] within the next 24-48 hours.   Discussed pt with RN. Pt is noted to be more alert today. Pt ate 15% of her breakfast tray this morning. Per RN, pt is consuming some of her Ensures, but is vomiting when too much is presented to her at once.   Pt receiving Ensure Enlive TID.   UOP: 751m x24 hours I/O: +9,084.467msince admit  Labs:  Corrected calcium 10.72 (H) CBGs 78-95-89 Medications reviewed and include: Novolog,Chronulac   Diet Order:   Diet Order            Diet regular Room service appropriate? Yes; Fluid consistency: Thin  Diet effective now              EDUCATION NEEDS:   Not appropriate for education at this time  Skin:  Skin Assessment: Skin Integrity Issues: Skin Integrity Issues:: Stage II, Incisions Stage II: sacrum Incisions: neck, abdomen  Last BM:  5/19 type 6  Height:   Ht Readings from Last 1 Encounters:  10/30/19 5' 4"  (1.626 m)    Weight:   Wt Readings from Last 1 Encounters:  11/06/19 120.6 kg    BMI:  Body mass index is 45.64 kg/m.  Estimated Nutritional Needs:   Kcal:  1900-2100  Protein:  95-105 grams  Fluid:  >1.9L/d    AmLarkin InaMS, RD, LDN RD pager number and weekend/on-call pager number located in [place]

## 2019-11-07 NOTE — Consult Note (Signed)
[place]: Chart reviewed with our inpatient MD. States pt is certainly appropriate for hospice care but still taking oral medications with no unmanaged symptom management needs requiring injectable medications which would qualify her for GIP level of care at our inpatient facility. Approved for hospice at home if family would like to pursue or will re-evaluate status tomorrow to assess any new/unmanaged symptoms. Have reached out to family to discuss above. Awaiting return call.  Doroteo Glassman, RN [facility] [phone]

## 2019-11-07 NOTE — Plan of Care (Signed)
  Problem: Nutrition: Goal: Adequate nutrition will be maintained Outcome: Not Progressing   

## 2019-11-07 NOTE — Progress Notes (Signed)
Pt placed on cpap 

## 2019-11-07 NOTE — Progress Notes (Signed)
Left upper extremity venous duplex completed. Refer to "CV Proc" under chart review to view preliminary results.  11/07/2019 9:41 AM Kelby Aline., MHA, RVT, RDCS, RDMS

## 2019-11-08 DIAGNOSIS — E872 Acidosis: Secondary | ICD-10-CM

## 2019-11-08 DIAGNOSIS — E871 Hypo-osmolality and hyponatremia: Secondary | ICD-10-CM

## 2019-11-08 DIAGNOSIS — R739 Hyperglycemia, unspecified: Secondary | ICD-10-CM

## 2019-11-08 DIAGNOSIS — K769 Liver disease, unspecified: Secondary | ICD-10-CM

## 2019-11-08 LAB — COMPREHENSIVE METABOLIC PANEL
ALT: 87 U/L — ABNORMAL HIGH (ref 0–44)
AST: 313 U/L — ABNORMAL HIGH (ref 15–41)
Albumin: 1.5 g/dL — ABNORMAL LOW (ref 3.5–5.0)
Alkaline Phosphatase: 1282 U/L — ABNORMAL HIGH (ref 38–126)
Anion gap: 13 (ref 5–15)
BUN: 19 mg/dL (ref 8–23)
CO2: 18 mmol/L — ABNORMAL LOW (ref 22–32)
Calcium: 8.7 mg/dL — ABNORMAL LOW (ref 8.9–10.3)
Chloride: 108 mmol/L (ref 98–111)
Creatinine, Ser: 0.73 mg/dL (ref 0.44–1.00)
GFR calc Af Amer: >60 mL/min (ref >60)
GFR calc non Af Amer: >60 mL/min (ref >60)
Glucose, Bld: 133 mg/dL — ABNORMAL HIGH (ref 70–99)
Potassium: 3.8 mmol/L (ref 3.5–5.1)
Sodium: 139 mmol/L (ref 135–145)
Total Bilirubin: 3 mg/dL — ABNORMAL HIGH (ref 0.3–1.2)
Total Protein: 5.1 g/dL — ABNORMAL LOW (ref 6.5–8.1)

## 2019-11-08 LAB — CBC WITH DIFFERENTIAL/PLATELET
Abs Immature Granulocytes: 0.87 10*3/uL — ABNORMAL HIGH (ref 0.00–0.07)
Basophils Absolute: 0.1 10*3/uL (ref 0.0–0.1)
Basophils Relative: 1 %
Eosinophils Absolute: 0.2 10*3/uL (ref 0.0–0.5)
Eosinophils Relative: 1 %
HCT: 37 % (ref 36.0–46.0)
Hemoglobin: 11.4 g/dL — ABNORMAL LOW (ref 12.0–15.0)
Immature Granulocytes: 4 %
Lymphocytes Relative: 5 %
Lymphs Abs: 1.2 10*3/uL (ref 0.7–4.0)
MCH: 30.9 pg (ref 26.0–34.0)
MCHC: 30.8 g/dL (ref 30.0–36.0)
MCV: 100.3 fL — ABNORMAL HIGH (ref 80.0–100.0)
Monocytes Absolute: 2.2 10*3/uL — ABNORMAL HIGH (ref 0.1–1.0)
Monocytes Relative: 9 %
Neutro Abs: 20 10*3/uL — ABNORMAL HIGH (ref 1.7–7.7)
Neutrophils Relative %: 80 %
Platelets: UNDETERMINED 10*3/uL (ref 150–400)
RBC: 3.69 MIL/uL — ABNORMAL LOW (ref 3.87–5.11)
RDW: 26.6 % — ABNORMAL HIGH (ref 11.5–15.5)
WBC: 24.6 10*3/uL — ABNORMAL HIGH (ref 4.0–10.5)
nRBC: 2.2 % — ABNORMAL HIGH (ref 0.0–0.2)

## 2019-11-08 LAB — AMMONIA: Ammonia: 67 umol/L — ABNORMAL HIGH (ref 9–35)

## 2019-11-08 LAB — GLUCOSE, CAPILLARY: Glucose-Capillary: 122 mg/dL — ABNORMAL HIGH (ref 70–99)

## 2019-11-08 LAB — PHOSPHORUS: Phosphorus: 1.5 mg/dL — ABNORMAL LOW (ref 2.5–4.6)

## 2019-11-08 LAB — MAGNESIUM: Magnesium: 1.8 mg/dL (ref 1.7–2.4)

## 2019-11-08 LAB — CA 125: Cancer Antigen (CA) 125: 14072 U/mL — ABNORMAL HIGH (ref 0.0–38.1)

## 2019-11-08 MED ORDER — SODIUM PHOSPHATES 45 MMOLE/15ML IV SOLN
30.0000 mmol | Freq: Once | INTRAVENOUS | Status: AC
  Administered 2019-11-08: 30 mmol via INTRAVENOUS
  Filled 2019-11-08: qty 10

## 2019-11-08 NOTE — Progress Notes (Signed)
Daily Progress Note   Patient Name: [name]       Date: 11/08/2019 DOB: [birth year]  Age: 74 y.o. MRN#: [id number] Attending Physician: Patricia Riding, MD Primary Care Physician: Patricia Sake, MD Admit Date: 10/29/2019  Reason for Consultation/Follow-up: To discuss complex medical decision making related to patient's goals of care  Subjective: Patient asleep.  No family at bedside.   I called [name] on the phone.  Per [name] has been more alert and started eating more today.  He is hopeful that if treatment and chemo will provide even another 2-3 weeks of time when [name] is alert and able to engage with the family - - then it will be worth it.  He is very concerned that she will develop side effects and be uncomfortable.  He sounds as though he is trying to take it 1 step at a time.  I gave [name] our office number and assured him that we will always be here to help with symptoms or to help him talk thru things.  [name] has met [name] and I will ask her to touch base with [name] on Tuesday after [name]'s first chemo treatment on Monday.   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  Planning to start chemo Monday. Still suffering with an element of encephalopathy.  She is on lactulose.  Albumin 1.5.  LFTs elevated.   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 Recently getting extremely debilitated and unable to walk since last 2 weeks.  In the emergency room,  hemodynamically stable. Clinically dehydrated. CT scan abdomen pelvis shows extensive hepatic metastatic disease, omental caking and complex cyst left lower quadrant, multiple lymphadenopathy.    Length of Stay: 9   Vital Signs: BP 120/64 (BP Location: Right Arm)   Pulse 96   Temp 98.1 F (36.7 C) (Oral)   Resp 18   Ht 5' 4"  (1.626 m)   Wt 120.1 kg   SpO2 100%   BMI 45.45 kg/m  SpO2: SpO2: 100 % O2 Device: O2 Device: Room [employer]  O2 Flow Rate:         Palliative Assessment/Data:  20%     Palliative Care Plan    Recommendations/Plan:  PMT will check in intermittently in a supportive fashion.  I will ask my colleague [name] to touch base with the family on Tuesday.  Code Status:  DNR  Prognosis:   Unable to determine  I'm concerned that even with chemotherapy she has less than 6 months.   Discharge Planning:  To Be Determined  Care plan was discussed with husband  Thank you for allowing the Palliative Medicine Team to assist in the care of this patient.  Total time spent:  35 min     Greater than 50%  of this time was spent counseling and coordinating care related to the above assessment and plan.  Patricia Jenny, PA-C Palliative Medicine  Please contact Palliative MedicineTeam phone at [phone] for questions and concerns between 7 am - 7 pm.   Please see AMION for individual provider pager numbers.

## 2019-11-08 NOTE — Progress Notes (Signed)
PROGRESS NOTE  [name] [id number] DOB: 1945/08/12   PCP: Leonides Sake, MD  Patient is from: Home  DOA: 10/29/2019 LOS: 9  Brief Narrative / Interim history: 74 year old female with history of DM-two, temporal arteritis, migraine, OSA and cervical radiculopathy admitted with waxing and waning abdominal pain for 3 months that has gotten worse over the last 3 weeks, generalized weakness, poor appetite and nausea. Presented to PCP who was concerned about cancer. She was directed to ED. In ED, found to have hepatic metastasis, omental caking, complex cystic left lower quadrant mass retroperitoneal adenopathy. Also found to have hyponatremia, hypocalcemia and AKI. Underwent liver biopsy by IR on 5/14. 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 Oncology and palliative care consulted. Oncology planning to start chemotherapy the week of 5/24. Patient will be transferred to [facility] long hospital over the weekend. Oncology also requested PICC line placement.  Subjective: Seen and examined earlier this morning.  No major events overnight or this morning.  No complaints but not a reliable historian.  She is somewhat drowsy and slow.  Does not comprehend well.  Only oriented to self and place partially.  She responds no to pain.   Objective: Vitals:   11/07/19 0853 11/07/19 2041 11/08/19 0547 11/08/19 1000  BP: 114/63 (!) 108/56 109/63 (!) 104/54  Pulse: 96 90 98 92  Resp: 16 16 20 20   Temp: 98.4 F (36.9 C) 97.6 F (36.4 C) 98.1 F (36.7 C) 98.1 F (36.7 C)  TempSrc:  Oral Oral Oral  SpO2: 99% 98% 99% 98%  Weight:  120.1 kg    Height:        Intake/Output Summary (Last 24 hours) at 11/08/2019 1123 Last data filed at 11/08/2019 0900 Gross per 24 hour  Intake 2175.71 ml  Output 850 ml  Net 1325.71 ml   Filed Weights   11/06/19 0517 11/06/19 2129 11/07/19 2041  Weight: 116.3 kg 120.6 kg 120.1 kg     Examination:  GENERAL: No apparent distress.  Nontoxic but drowsy and slow. HEENT: MMM.  Vision and hearing grossly intact.  NECK: Supple.  No apparent JVD.  RESP: On room air.  No IWOB.  Fair aeration bilaterally. CVS:  RRR. Heart sounds normal.  ABD/GI/GU: BS+. Abd soft, NTND.  MSK/EXT:  Moves extremities. No apparent deformity. No edema.  SKIN: no apparent skin lesion or wound NEURO: Drowsy.  Oriented to self and partial place.  No apparent focal neuro deficit. PSYCH: Calm. Normal affect.  Procedures:  5/14-liver biopsy by IR. Pathology result most consistent with primary ovarian or  primary peritoneal serous carcinoma.  Microbiology summarized: COVID-19 PCR negative.  Assessment & Plan: Metastatic primary peritoneal serous carcinoma- Liver biopsy on 5/14 reported as "most consistent with primary ovarian or  primary peritoneal serous carcinom". Oncology suspects the later. -Plan to transfer to [facility], oncology floor in anticipation for chemo beginning 5/24 -IR consulted for PICC line placement.  PICC team was not able to place yesterday.  Acute metabolic encephalopathy: Hepatic encephalopathy? Ammonia 61 >> 86> 58.  MRI brain, ABG, TSH, B12, a.m. cortisol and ACTH stimulation test unrevealing. Has leukocytosis likely from recent steroid. No focal neuro deficits. -Increase lactulose to 30 g 3 times daily 5/21. -Recheck ammonia level -Frequent orientation and delirium precautions.  Volume depletion/AKI/hyperkalemia: Resolved. -Encourage hydration  Non-anion gap metabolic acidosis: Unclear source. -Continue monitoring  Hypophosphatemia -Replenish with sodium phosphate  Hyperglycemia: Not diabetic.  A1c 4.1%. -Discontinue CBG monitoring and  SSI.  Hypercalcemia-likely due to dehydration. Resolved.  Elevated liver enzymes/hyperbilirubinemia/elevated alkaline phosphatase: CK within normal.  GGT elevated. Both direct and indirect bilirubin  elevated. -Continue monitoring  History of temporal arteritis: Stable -Just came off steroids recently.  Leukocytosis/bandemia: Due to steroid? -Continue monitoring.  Morbid obesity: Body mass index is 45.45 kg/m.  Goal of care/DNR/DNI-appropriate. Initially plan was to discharge to [facility] but oncology would like to start chemotherapy now. -Palliative medicine following.  Nutrition Problem: Inadequate oral intake Etiology: poor appetite Signs/Symptoms: per patient/family report Interventions: Ensure Enlive (each supplement provides 350kcal and 20 grams of protein), MVI Pressure Injury 11/06/19 Sacrum Stage 2 -  Partial thickness loss of dermis presenting as a shallow open injury with a red, pink wound bed without slough. (Active)  11/06/19 1500  Location: Sacrum  Location Orientation:   Staging: Stage 2 -  Partial thickness loss of dermis presenting as a shallow open injury with a red, pink wound bed without slough.  Wound Description (Comments):   Present on Admission:    DVT prophylaxis: Start subcu Lovenox Code Status: DNR/DNI Family Communication: Updated patient's husband at bedside on 5/21.  No family at bedside today. Status is: Inpatient  Remains inpatient appropriate because:IV treatments appropriate due to intensity of illness or inability to take PO and Inpatient level of care appropriate due to severity of illness   Dispo: The patient is from: Home              Anticipated d/c is to: To be determined              Anticipated d/c date is: > 3 days              Patient currently is not medically stable to d/c.        Consultants:  Oncology Palliative medicine   Sch Meds:  Scheduled Meds: . citalopram  10 mg Oral Daily  . feeding supplement (ENSURE ENLIVE)  237 mL Oral TID BM  . fluticasone  1 spray Each Nare BID  . lactulose  20 g Oral TID  . nystatin  5 mL Mouth/Throat QID  . nystatin   Topical TID  . pantoprazole  40 mg Oral Q supper    Continuous Infusions: . sodium chloride 75 mL/hr at 11/08/19 0138   PRN Meds:.acetaminophen **OR** acetaminophen, ondansetron **OR** ondansetron (ZOFRAN) IV  Antimicrobials: Anti-infectives (From admission, onward)   Start     Dose/Rate Route Frequency Ordered Stop   11/01/19 1615  fluconazole (DIFLUCAN) tablet 150 mg     150 mg Oral  Once 11/01/19 1609 11/01/19 1713       I have personally reviewed the following labs and images: CBC: Recent Labs  Lab 11/03/19 0448 11/04/19 0604 11/05/19 0608 11/06/19 0449 11/07/19 0801  WBC 13.8* 15.1* 15.1* 17.8* 19.2*  NEUTROABS 10.1* 11.6* 12.0* 13.8* 14.4*  HGB 11.2* 11.4* 12.7 10.9* 11.5*  HCT 36.8 36.7 41.4 35.2* 37.4  MCV 98.1 97.9 98.3 98.3 99.7  PLT 322 307 279 319 300   BMP &GFR Recent Labs  Lab 11/04/19 0604 11/05/19 0608 11/06/19 0449 11/07/19 0457 11/08/19 1008  NA 139 143 141 143 139  K 4.1 5.1 4.3 3.9 3.8  CL 107 109 110 110 108  CO2 21* 22 20* 19* 18*  GLUCOSE 97 93 109* 81 133*  BUN 14 14 20 20 19   CREATININE 0.66 0.73 0.89 0.75 0.73  CALCIUM 10.1 9.6 8.9 8.8* 8.7*  MG  --   --   --  1.8 1.8  PHOS  --   --   --   --  1.5*   Estimated Creatinine Clearance: 81.2 mL/min (by C-G formula based on SCr of 0.73 mg/dL). Liver & Pancreas: Recent Labs  Lab 11/04/19 0604 11/04/19 0604 11/05/19 [id number] 11/06/19 0449 11/07/19 0457 11/07/19 1540 11/08/19 1008  AST 212*  --  251* 502* 470*  --  313*  ALT 43  --  51* 89* 96*  --  87*  ALKPHOS 1,396*  --  1,346* 1,285* 1,347*  --  1,282*  BILITOT 2.5*   < > 2.7* 3.0* 3.0* 2.9* 3.0*  PROT 5.3*  --  5.5* 5.2* 5.1*  --  5.1*  ALBUMIN 1.6*  --  1.7* 1.6* 1.6*  --  1.5*   < > = values in this interval not displayed.   No results for input(s): LIPASE, AMYLASE in the last 168 hours. Recent Labs  Lab 11/02/19 0339 11/03/19 0448 11/05/19 0608 11/06/19 0449  AMMONIA 76* 86* 52* 58*   Diabetic: Recent Labs    11/07/19 1540  HGBA1C 4.1*   Recent Labs  Lab  11/07/19 0410 11/07/19 0526 11/07/19 0821 11/07/19 1159 11/08/19 1113  GLUCAP 78 95 89 95 122*   Cardiac Enzymes: No results for input(s): CKTOTAL, CKMB, CKMBINDEX, TROPONINI in the last 168 hours. No results for input(s): PROBNP in the last 8760 hours. Coagulation Profile: Recent Labs  Lab 11/02/19 0339 11/06/19 0449  INR 1.1 1.3*   Thyroid Function Tests: Recent Labs    11/07/19 1540  TSH 1.306   Lipid Profile: No results for input(s): CHOL, HDL, LDLCALC, TRIG, CHOLHDL, LDLDIRECT in the last 72 hours. Anemia Panel: Recent Labs    11/07/19 1540  VITAMINB12 515   Urine analysis:    Component Value Date/Time   COLORURINE AMBER (A) 06/18/2019 1230   APPEARANCEUR CLOUDY (A) 06/18/2019 1230   LABSPEC 1.026 06/18/2019 1230   PHURINE 6.0 06/18/2019 1230   GLUCOSEU >=500 (A) 06/18/2019 1230   HGBUR SMALL (A) 06/18/2019 1230   BILIRUBINUR NEGATIVE 06/18/2019 1230   KETONESUR 5 (A) 06/18/2019 1230   PROTEINUR 30 (A) 06/18/2019 1230   UROBILINOGEN 1.0 07/11/2012 1023   NITRITE POSITIVE (A) 06/18/2019 1230   LEUKOCYTESUR LARGE (A) 06/18/2019 1230   Sepsis Labs: Invalid input(s): PROCALCITONIN, Bernard  Microbiology: Recent Results (from the past 240 hour(s))  SARS Coronavirus 2 by RT PCR (hospital order, performed in [facility] hospital lab) Nasopharyngeal Nasopharyngeal Swab     Status: None   Collection Time: 10/29/19 10:49 PM   Specimen: Nasopharyngeal Swab  Result Value Ref Range Status   SARS Coronavirus 2 NEGATIVE NEGATIVE Final    Comment: (NOTE) SARS-CoV-2 target nucleic acids are NOT DETECTED. The SARS-CoV-2 RNA is generally detectable in upper and lower respiratory specimens during the acute phase of infection. The lowest concentration of SARS-CoV-2 viral copies this assay can detect is 250 copies / mL. A negative result does not preclude SARS-CoV-2 infection and should not be used as the sole basis for treatment or other patient management  decisions.  A negative result may occur with improper specimen collection / handling, submission of specimen other than nasopharyngeal swab, presence of viral mutation(s) within the areas targeted by this assay, and inadequate number of viral copies (<250 copies / mL). A negative result must be combined with clinical observations, patient history, and epidemiological information. Fact Sheet for Patients:   StrictlyIdeas.no Fact Sheet for Healthcare Providers: BankingDealers.co.za This test is not yet approved or cleared  by the [place] and has been authorized for detection and/or diagnosis of SARS-CoV-2 by FDA under an Emergency Use Authorization (EUA).  This EUA will remain in effect (meaning this test can be used) for the duration of the COVID-19 declaration under Section 564(b)(1) of the Act, 21 U.S.C. section 360bbb-3(b)(1), unless the authorization is terminated or revoked sooner. Performed at [facility], [place] [address], [place], [place] [address]     Radiology Studies: [place] EKG SITE RITE  Result Date: 11/07/2019 If [facility] image not attached, placement could not be confirmed due to current cardiac rhythm.   Alithia Zavaleta T. [place]  If 7PM-7AM, please contact night-coverage [email or web address] Password [facility] 11/08/2019, 11:23 AM

## 2019-11-09 MED ORDER — LACTULOSE 10 GM/15ML PO SOLN
30.0000 g | Freq: Three times a day (TID) | ORAL | Status: DC
  Administered 2019-11-09 – 2019-11-10 (×5): 30 g via ORAL
  Filled 2019-11-09 (×5): qty 45

## 2019-11-09 MED ORDER — DEXAMETHASONE 4 MG PO TABS
10.0000 mg | ORAL_TABLET | Freq: Once | ORAL | Status: AC
  Administered 2019-11-09: 10 mg via ORAL
  Filled 2019-11-09: qty 3

## 2019-11-09 MED ORDER — KETOROLAC TROMETHAMINE 15 MG/ML IJ SOLN
15.0000 mg | Freq: Once | INTRAMUSCULAR | Status: AC
  Administered 2019-11-09: 15 mg via INTRAVENOUS
  Filled 2019-11-09: qty 1

## 2019-11-09 MED ORDER — CHLORHEXIDINE GLUCONATE CLOTH 2 % EX PADS
6.0000 | MEDICATED_PAD | Freq: Every day | CUTANEOUS | Status: DC
  Administered 2019-11-09 – 2019-11-17 (×7): 6 via TOPICAL

## 2019-11-09 NOTE — Progress Notes (Signed)
[name] was lying in bed awake and alert when I arrived. She said she is waiting to start chemo. She said she was told they would start tomorrow or day after tomorrow. She said she has been waiting and guess they don't have time for her yet. She said she has been in the hospital five weeks.  (I learned from her nurse she was transported here from [facility] today.)  She enjoyed talking about her grandchildren. Of the three she said two are almost grown. She said grandchildren are her blessing for being a parent.  Pt wanted prayer and was very grateful for visit. Will refer her to Chaplain for follow-up.  Please page if a visit is needed prior to that time. [place], [place]   11/09/19 1900  Clinical Encounter Type  Visited With Patient

## 2019-11-09 NOTE — Progress Notes (Signed)
Patient transferred to [facility] via [facility] for further oncology treatment / work up.  Report called to receiving RN.  Patient confused / disoriented x 3.   Escorted via gurney by 3 attendants.  Family aware of transfer and are present.  Will follow to [facility]. Via private vehicle.

## 2019-11-09 NOTE — Progress Notes (Signed)
PROGRESS NOTE  [name] [id number] DOB: 1945/12/23   PCP: Leonides Sake, MD  Patient is from: Home  DOA: 10/29/2019 LOS: 53  Brief Narrative / Interim history: 74 year old female with history of DM-two, temporal arteritis, migraine, OSA and cervical radiculopathy admitted with waxing and waning abdominal pain for 3 months that has gotten worse over the last 3 weeks, generalized weakness, poor appetite and nausea. Presented to PCP who was concerned about cancer. She was directed to ED. In ED, found to have hepatic metastasis, omental caking, complex cystic left lower quadrant mass retroperitoneal adenopathy. Also found to have hyponatremia, hypocalcemia and AKI. Underwent liver biopsy by IR on 5/14. 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 Oncology and palliative care consulted. Oncology planning to start chemotherapy the week of 5/24. Patient will be transferred to [facility] long hospital over the weekend. Oncology also requested PICC line placement.  IR to place PICC line on 5/24 at [place] [facility].  IV team was not able to.  Subjective: Seen and examined earlier this morning.  No major events overnight of this morning.   Reports right upper extremity pain from blood draws and IV line.  No other complaints but not a great historian.  She is somewhat groggy.  Oriented to self and partial place but not time or situation.  Objective: Vitals:   11/08/19 1644 11/08/19 2139 11/09/19 0615 11/09/19 0916  BP: 120/64 117/66 110/65 123/73  Pulse: 96 94 (!) 101 (!) 101  Resp: 18 19 14 18   Temp: 98.1 F (36.7 C) 97.6 F (36.4 C) 98.3 F (36.8 C) 98 F (36.7 C)  TempSrc: Oral Oral  Oral  SpO2: 100% 98% 98% 97%  Weight:  120.1 kg    Height:        Intake/Output Summary (Last 24 hours) at 11/09/2019 1320 Last data filed at 11/09/2019 0900 Gross per 24 hour  Intake 1611.84 ml  Output 725 ml  Net 886.84 ml   Filed Weights   11/06/19 2129 11/07/19 2041 11/08/19 2139  Weight: 120.6 kg 120.1 kg 120.1 kg    Examination:  GENERAL: No apparent distress.  Nontoxic. HEENT: MMM.  Vision and hearing grossly intact.  NECK: Supple.  No apparent JVD.  RESP: 98% on room air.  No IWOB.  Fair aeration bilaterally. CVS:  RRR. Heart sounds normal.  ABD/GI/GU: BS+. Abd soft, NTND.  MSK/EXT:  Moves extremities. No apparent deformity.  1+ edema in all extremities SKIN: no apparent skin lesion or wound NEURO: Somewhat sleepy but wakes to voice.  Oriented to self and partial place but not time or situation.  No apparent focal neuro deficit. PSYCH: Somewhat sleepy.  No distress or agitation.  Procedures:  5/14-liver biopsy by IR. Pathology result most consistent with primary ovarian or  primary peritoneal serous carcinoma.  Microbiology summarized: COVID-19 PCR negative.  Assessment & Plan: Metastatic primary peritoneal serous carcinoma- Liver biopsy on 5/14 reported as "most consistent with primary ovarian or  primary peritoneal serous carcinom". Oncology suspects the later. -Plan to transfer to [facility], oncology floor in anticipation for chemo beginning 5/24 -IR consulted for PICC line placement.  PICC team was not able to place yesterday.  Acute metabolic encephalopathy: hepatic encephalopathy? Ammonia 61 >> 86> 58>67.  MRI brain, ABG, TSH, B12, a.m. cortisol and ACTH stimulation test unrevealing. Has leukocytosis likely from recent steroid.  Afebrile.  No focal neuro deficits. -Increased lactulose to 30 g 3 times daily -Recheck ammonia level -  Frequent orientation and delirium precautions.  Volume depletion/AKI/hyperkalemia: Now she appears fluid with 1+ edema in all extremities. -Discontinue IV fluid  Non-anion gap metabolic acidosis: Unclear source. -Continue monitoring  Hypophosphatemia -Replenished with sodium phosphate -Recheck a replenish as appropriate  Hyperglycemia: Not diabetic.  A1c  4.1%. -Discontinued CBG monitoring and SSI.  Hypercalcemia-likely due to dehydration and possibly from malignancy.  Received Zometa x1. Resolved.  Elevated liver enzymes/hyperbilirubinemia/elevated alkaline phosphatase: CK within normal.  GGT elevated. Both direct and indirect bilirubin elevated. -Continue monitoring  History of temporal arteritis: Stable -Just came off steroids recently.  Leukocytosis/bandemia: Afebrile.  No obvious signs of infection.  She was on steroids prior to admission but the effect shouldn't last this long -Consider infectious work-up if no improvement.  Morbid obesity: Body mass index is 45.45 kg/m.  Goal of care/DNR/DNI-appropriate. Initially plan was to discharge to [facility] but oncology would like to start chemotherapy now.  Still poor prognosis. -Palliative medicine following.  Nutrition Problem: Inadequate oral intake Etiology: poor appetite Signs/Symptoms: per patient/family report Interventions: Ensure Enlive (each supplement provides 350kcal and 20 grams of protein), MVI Pressure Injury 11/06/19 Sacrum Stage 2 -  Partial thickness loss of dermis presenting as a shallow open injury with a red, pink wound bed without slough. (Active)  11/06/19 1500  Location: Sacrum  Location Orientation:   Staging: Stage 2 -  Partial thickness loss of dermis presenting as a shallow open injury with a red, pink wound bed without slough.  Wound Description (Comments):   Present on Admission:    DVT prophylaxis: Start subcu Lovenox Code Status: DNR/DNI Family Communication: Updated patient's husband at bedside on 5/21.  No family at bedside today. Status is: Inpatient  Remains inpatient appropriate because:Altered mental status, Inpatient level of care appropriate due to severity of illness and Initiation of chemotherapy   Dispo: The patient is from: Home              Anticipated d/c is to: To be determined              Anticipated d/c date is: > 3  days              Patient currently is not medically stable to d/c.        Consultants:  Oncology Palliative medicine   Sch Meds:  Scheduled Meds: . citalopram  10 mg Oral Daily  . feeding supplement (ENSURE ENLIVE)  237 mL Oral TID BM  . fluticasone  1 spray Each Nare BID  . lactulose  30 g Oral TID  . nystatin  5 mL Mouth/Throat QID  . nystatin   Topical TID  . pantoprazole  40 mg Oral Q supper   Continuous Infusions: . sodium chloride 75 mL/hr at 11/09/19 0241   PRN Meds:.acetaminophen **OR** acetaminophen, ondansetron **OR** ondansetron (ZOFRAN) IV  Antimicrobials: Anti-infectives (From admission, onward)   Start     Dose/Rate Route Frequency Ordered Stop   11/01/19 1615  fluconazole (DIFLUCAN) tablet 150 mg     150 mg Oral  Once 11/01/19 1609 11/01/19 1713       I have personally reviewed the following labs and images: CBC: Recent Labs  Lab 11/04/19 0604 11/05/19 0608 11/06/19 0449 11/07/19 0801 11/08/19 1008  WBC 15.1* 15.1* 17.8* 19.2* 24.6*  NEUTROABS 11.6* 12.0* 13.8* 14.4* 20.0*  HGB 11.4* 12.7 10.9* 11.5* 11.4*  HCT 36.7 41.4 35.2* 37.4 37.0  MCV 97.9 98.3 98.3 99.7 100.3*  PLT 307 279 319 300 PLATELET CLUMPS NOTED  ON SMEAR, UNABLE TO ESTIMATE   BMP &GFR Recent Labs  Lab 11/04/19 0604 11/05/19 0608 11/06/19 0449 11/07/19 0457 11/08/19 1008  NA 139 143 141 143 139  K 4.1 5.1 4.3 3.9 3.8  CL 107 109 110 110 108  CO2 21* 22 20* 19* 18*  GLUCOSE 97 93 109* 81 133*  BUN 14 14 20 20 19   CREATININE 0.66 0.73 0.89 0.75 0.73  CALCIUM 10.1 9.6 8.9 8.8* 8.7*  MG  --   --   --  1.8 1.8  PHOS  --   --   --   --  1.5*   Estimated Creatinine Clearance: 81.2 mL/min (by C-G formula based on SCr of 0.73 mg/dL). Liver & Pancreas: Recent Labs  Lab 11/04/19 0604 11/04/19 0604 11/05/19 [id number] 11/06/19 0449 11/07/19 0457 11/07/19 1540 11/08/19 1008  AST 212*  --  251* 502* 470*  --  313*  ALT 43  --  51* 89* 96*  --  87*  ALKPHOS 1,396*  --   1,346* 1,285* 1,347*  --  1,282*  BILITOT 2.5*   < > 2.7* 3.0* 3.0* 2.9* 3.0*  PROT 5.3*  --  5.5* 5.2* 5.1*  --  5.1*  ALBUMIN 1.6*  --  1.7* 1.6* 1.6*  --  1.5*   < > = values in this interval not displayed.   No results for input(s): LIPASE, AMYLASE in the last 168 hours. Recent Labs  Lab 11/03/19 0448 11/05/19 0608 11/06/19 0449 11/08/19 1142  AMMONIA 86* 52* 58* 67*   Diabetic: Recent Labs    11/07/19 1540  HGBA1C 4.1*   Recent Labs  Lab 11/07/19 0410 11/07/19 0526 11/07/19 0821 11/07/19 1159 11/08/19 1113  GLUCAP 78 95 89 95 122*   Cardiac Enzymes: No results for input(s): CKTOTAL, CKMB, CKMBINDEX, TROPONINI in the last 168 hours. No results for input(s): PROBNP in the last 8760 hours. Coagulation Profile: Recent Labs  Lab 11/06/19 0449  INR 1.3*   Thyroid Function Tests: Recent Labs    11/07/19 1540  TSH 1.306   Lipid Profile: No results for input(s): CHOL, HDL, LDLCALC, TRIG, CHOLHDL, LDLDIRECT in the last 72 hours. Anemia Panel: Recent Labs    11/07/19 1540  VITAMINB12 515   Urine analysis:    Component Value Date/Time   COLORURINE AMBER (A) 06/18/2019 1230   APPEARANCEUR CLOUDY (A) 06/18/2019 1230   LABSPEC 1.026 06/18/2019 1230   PHURINE 6.0 06/18/2019 1230   GLUCOSEU >=500 (A) 06/18/2019 1230   HGBUR SMALL (A) 06/18/2019 1230   BILIRUBINUR NEGATIVE 06/18/2019 1230   KETONESUR 5 (A) 06/18/2019 1230   PROTEINUR 30 (A) 06/18/2019 1230   UROBILINOGEN 1.0 07/11/2012 1023   NITRITE POSITIVE (A) 06/18/2019 1230   LEUKOCYTESUR LARGE (A) 06/18/2019 1230   Sepsis Labs: Invalid input(s): PROCALCITONIN, [place]  Microbiology: No results found for this or any previous visit (from the past 240 hour(s)).  Radiology Studies: No results found.  Heath Badon T. Deport  If 7PM-7AM, please contact night-coverage [email or web address] Password [email or web address] 11/09/2019, 1:20 PM

## 2019-11-09 NOTE — Progress Notes (Signed)
   11/09/19 [id number]  Provider Notification  Provider Name/Title Mitzi Hansen MD  Date Provider Notified 11/09/19  Time Provider Notified [phone]  Notification Type Page  Notification Reason Change in status (Lab unable to draws lab - mult attempts - MD paged)  Response No new orders   Laboratory personnel attempted multiple times to draw AM labs without success.  Dr. Myna Hidalgo made aware.  No new orders received.  Will continue to monitor patient.  Earleen Reaper RN

## 2019-11-10 ENCOUNTER — Inpatient Hospital Stay (HOSPITAL_COMMUNITY): Payer: Medicare Other

## 2019-11-10 DIAGNOSIS — C786 Secondary malignant neoplasm of retroperitoneum and peritoneum: Secondary | ICD-10-CM

## 2019-11-10 LAB — CBC
HCT: 30.8 % — ABNORMAL LOW (ref 36.0–46.0)
Hemoglobin: 9.4 g/dL — ABNORMAL LOW (ref 12.0–15.0)
MCH: 30.7 pg (ref 26.0–34.0)
MCHC: 30.5 g/dL (ref 30.0–36.0)
MCV: 100.7 fL — ABNORMAL HIGH (ref 80.0–100.0)
Platelets: 227 10*3/uL (ref 150–400)
RBC: 3.06 MIL/uL — ABNORMAL LOW (ref 3.87–5.11)
RDW: 26.3 % — ABNORMAL HIGH (ref 11.5–15.5)
WBC: 19.5 10*3/uL — ABNORMAL HIGH (ref 4.0–10.5)
nRBC: 0.3 % — ABNORMAL HIGH (ref 0.0–0.2)

## 2019-11-10 LAB — COMPREHENSIVE METABOLIC PANEL
ALT: 62 U/L — ABNORMAL HIGH (ref 0–44)
AST: 199 U/L — ABNORMAL HIGH (ref 15–41)
Albumin: 1.6 g/dL — ABNORMAL LOW (ref 3.5–5.0)
Alkaline Phosphatase: 1158 U/L — ABNORMAL HIGH (ref 38–126)
Anion gap: 9 (ref 5–15)
BUN: 24 mg/dL — ABNORMAL HIGH (ref 8–23)
CO2: 20 mmol/L — ABNORMAL LOW (ref 22–32)
Calcium: 7.9 mg/dL — ABNORMAL LOW (ref 8.9–10.3)
Chloride: 107 mmol/L (ref 98–111)
Creatinine, Ser: 0.6 mg/dL (ref 0.44–1.00)
GFR calc Af Amer: >60 mL/min (ref >60)
GFR calc non Af Amer: >60 mL/min (ref >60)
Glucose, Bld: 103 mg/dL — ABNORMAL HIGH (ref 70–99)
Potassium: 4.4 mmol/L (ref 3.5–5.1)
Sodium: 136 mmol/L (ref 135–145)
Total Bilirubin: 2.7 mg/dL — ABNORMAL HIGH (ref 0.3–1.2)
Total Protein: 4.7 g/dL — ABNORMAL LOW (ref 6.5–8.1)

## 2019-11-10 LAB — AMMONIA: Ammonia: 54 umol/L — ABNORMAL HIGH (ref 9–35)

## 2019-11-10 LAB — PHOSPHORUS: Phosphorus: 2.6 mg/dL (ref 2.5–4.6)

## 2019-11-10 LAB — PROTIME-INR
INR: 1.2 (ref 0.8–1.2)
Prothrombin Time: 14.7 seconds (ref 11.4–15.2)

## 2019-11-10 LAB — MAGNESIUM: Magnesium: 1.9 mg/dL (ref 1.7–2.4)

## 2019-11-10 MED ORDER — LIDOCAINE HCL 1 % IJ SOLN
INTRAMUSCULAR | Status: AC
  Filled 2019-11-10: qty 20

## 2019-11-10 MED ORDER — ALBUMIN HUMAN 25 % IV SOLN
25.0000 g | Freq: Once | INTRAVENOUS | Status: AC
  Administered 2019-11-10: 25 g via INTRAVENOUS
  Filled 2019-11-10: qty 100

## 2019-11-10 MED ORDER — DEXAMETHASONE 4 MG PO TABS
10.0000 mg | ORAL_TABLET | Freq: Once | ORAL | Status: AC
  Administered 2019-11-10: 10 mg via ORAL
  Filled 2019-11-10: qty 3

## 2019-11-10 MED ORDER — FUROSEMIDE 10 MG/ML IJ SOLN
40.0000 mg | Freq: Once | INTRAMUSCULAR | Status: AC
  Administered 2019-11-10: 40 mg via INTRAVENOUS
  Filled 2019-11-10: qty 4

## 2019-11-10 NOTE — Progress Notes (Signed)
Patient due to start Paclitaxel and Carboplatin.  Chemotherapy teaching performed with patient and her spouse.  Questions answered.  Patient currently alert and oriented x 3 and able to sign Chemotherapy consent.  Consent placed in shadow chart.  Zandra Abts [facility]  11/10/2019  11:50 AM

## 2019-11-10 NOTE — Procedures (Addendum)
Right DL brachial vein PICC placed. Length 39 cm. Tip SVC/RA junction. No immediate complications. EBL < 2cc.  Ok to use. Medication used- 1% lidocaine to skin/SQ tissue.

## 2019-11-10 NOTE — Progress Notes (Signed)
PROGRESS NOTE    [name]  ATF:[id number] DOB: 1945-09-04 DOA: 10/29/2019 PCP: Leonides Sake, MD  Chief Complaint  Patient presents with  . Abdominal Pain  . Abnormal Lab    Brief Narrative:  74 year old female with history of DM-two, temporal arteritis, migraine, OSA and cervical radiculopathy admitted with waxing and waning abdominal pain for 3 months that has gotten worse over the last 3 weeks, generalized weakness, poor appetite and nausea. Presented to PCP who was concerned about cancer. She was directed to ED. In ED, found to have hepatic metastasis, omental caking, complex cystic left lower quadrant mass retroperitoneal adenopathy. Also found to have hyponatremia, hypocalcemia and AKI. Underwent liver biopsy by IR on 5/14. 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 Oncology and palliative care consulted. Oncology planning to start chemotherapy the week of 5/24. Patient will be transferred to [facility] hospital over the weekend. Oncology also requested PICC line placement.  IR to place PICC line on 5/24 at [place] [facility].  IV team was not able to.  Assessment & Plan:   Principal Problem:   Metastatic neoplastic disease ([place]) Active Problems:   Uncontrolled type 2 diabetes mellitus with hyperglycemia, with long-term current use of insulin (HCC)   Hypercalcemia   AKI (acute kidney injury) ([place])   Dehydration with hyponatremia   GERD without esophagitis   Metastatic malignant neoplasm [facility])   Palliative care by specialist   Goals of care, counseling/discussion   DNR (do not resuscitate)   Hypercalcemia of malignancy   Hepatic encephalopathy ([place])   Palliative care encounter   Pressure injury of skin   Hepatic dysfunction  Metastatic primary peritoneal serous carcinoma- Liver biopsy on 5/14 reported as "most consistent with primary ovarian or  primary peritoneal serous carcinom". Oncology suspects the  later. -Plan to transfer to [facility], oncology floor in anticipation for chemo beginning 5/24 - plan for carboplatin/taxol once picc placed -IR consulted for PICC line placement, s/p placement on 5/24.  PICC team was not able to place on 5/22.  Acute metabolic encephalopathy: hepatic encephalopathy? Ammonia 61 >> 86> 58>67.  MRI brain, ABG, TSH, B12, [place].m. cortisol and ACTH stimulation test unrevealing. Has leukocytosis likely from recent steroid.  Afebrile.  No focal neuro deficits. -Increased lactulose to 30 g 3 times daily -Recheck ammonia level - 62 -Frequent orientation and delirium precautions - mental status is improved per discussion with husband, continue to monitor   Elevated liver enzymes/hyperbilirubinemia/elevated alkaline phosphatase: CK within normal.  GGT elevated.  In setting of hepatic metastasis. Both direct and indirect bilirubin elevated. -Continue monitoring  Volume depletion/AKI/hyperkalemia:  Resolved  Volume Overload  Anasarca: Now she appears fluid with 1+ edema in all extremities.  In setting  - will consider diuretics +/- albumin with albumin low 1.6  Non-anion gap metabolic acidosis: Unclear source. -Continue monitoring  Hypophosphatemia -Replenished with sodium phosphate -Recheck and replenish as appropriate  Hyperglycemia: Not diabetic.  A1c 4.1%. -Discontinued CBG monitoring and SSI.  Hypercalcemia-likely due to dehydration and possibly from malignancy.  Received Zometa x1. Improved, continue to monitor.  Corrected calcium 9.8.  History of temporal arteritis: Stable -Just came off steroids recently.  Leukocytosis/bandemia: Afebrile.  No obvious signs of infection.  She was on steroids prior to admission but the effect shouldn't last this long -Consider infectious work-up if no improvement.  Morbid obesity: Body mass index is 45.45 kg/m.  Goal of care/DNR/DNI-appropriate. Initially plan was to discharge to [facility]  but oncology would like to start chemotherapy now.  Still poor prognosis. -Palliative medicine following.  DVT prophylaxis: SCD Code Status: DNR Family Communication: husband at bedside Disposition:   Status is: Inpatient  Remains inpatient appropriate because:IV treatments appropriate due to intensity of illness or inability to take PO and Inpatient level of care appropriate due to severity of illness   Dispo: The patient is from: Home              Anticipated d/c is to: to be determined              Anticipated d/c date is: > 3 days              Patient currently is not medically stable to d/c.   Consultants:   Oncology  Palliative  IR  Procedures:  5/14-liver biopsy by IR. Pathology result most consistent with primary ovarian or  primary peritoneal serous carcinoma.  5/24 PICC placement by IR  Antimicrobials:  Anti-infectives (From admission, onward)   Start     Dose/Rate Route Frequency Ordered Stop   11/01/19 1615  fluconazole (DIFLUCAN) tablet 150 mg     150 mg Oral  Once 11/01/19 1609 11/01/19 1713     Subjective: No new complaints Mental status is improved Husband at bedside  Objective: Vitals:   11/09/19 1609 11/09/19 2000 11/10/19 0459 11/10/19 1352  BP: 109/61 (!) 95/55 105/64 121/77  Pulse: (!) 103 92 85 98  Resp: _0 Temp: 97.9 F (36.6 C) 97.9 F (36.6 C) 97.8 F (36.6 C) 97.8 F (36.6 C)  TempSrc: Oral Oral Oral Oral  SpO2: 94% 97% 98% 95%  Weight: 123.4 kg     Height: _1  (1.626 m)       Intake/Output Summary (Last 24 hours) at 11/10/2019 1444 Last data filed at 11/10/2019 0651 Gross per 24 hour  Intake 74.95 ml  Output 200 ml  Net -125.05 ml   Filed Weights   11/07/19 2041 11/08/19 2139 11/09/19 1609  Weight: 120.1 kg 120.1 kg 123.4 kg    Examination:  General exam: Appears calm and comfortable  Respiratory system: Clear to auscultation. Respiratory effort normal. Cardiovascular system: S1 & S2 heard, RRR.   Gastrointestinal system: Abdomen is nondistended, soft and nontender. Central nervous system: Alert and oriented. No focal neurological deficits. Extremities: anasarca, diffuse edema  Skin: No rashes, lesions or ulcers Psychiatry: Judgement and insight appear normal. Mood & affect appropriate.     Data Reviewed: I have personally reviewed following labs and imaging studies  CBC: Recent Labs  Lab 11/04/19 0604 11/04/19 0604 11/05/19 9323 11/06/19 0449 11/07/19 0801 11/08/19 1008 11/10/19 0443  WBC 15.1*   < > 15.1* 17.8* 19.2* 24.6* 19.5*  NEUTROABS 11.6*  --  12.0* 13.8* 14.4* 20.0*  --   HGB 11.4*   < > 12.7 10.9* 11.5* 11.4* 9.4*  HCT 36.7   < > 41.4 35.2* 37.4 37.0 30.8*  MCV 97.9   < > 98.3 98.3 99.7 100.3* 100.7*  PLT 307   < > 279 319 300 PLATELET CLUMPS NOTED ON SMEAR, UNABLE TO ESTIMATE 227   < > = values in this interval not displayed.    Basic Metabolic Panel: Recent Labs  Lab 11/05/19 0608 11/06/19 0449 11/07/19 0457 11/08/19 1008 11/10/19 0443  NA 143 141 143 139 136  K 5.1 4.3 3.9 3.8 4.4  CL 109 110 110 108 107  CO2 22 20* 19* 18* 20*  GLUCOSE 93  109* 81 133* 103*  BUN _0 24*  CREATININE 0.73 0.89 0.75 0.73 0.60  CALCIUM 9.6 8.9 8.8* 8.7* 7.9*  MG  --   --  1.8 1.8 1.9  PHOS  --   --   --  1.5* 2.6    GFR: Estimated Creatinine Clearance: 82.5 mL/min (by C-G formula based on SCr of 0.6 mg/dL).  Liver Function Tests: Recent Labs  Lab 11/05/19 0608 11/05/19 0608 11/06/19 0449 11/07/19 0457 11/07/19 1540 11/08/19 1008 11/10/19 0443  AST 251*  --  502* 470*  --  313* 199*  ALT 51*  --  89* 96*  --  87* 62*  ALKPHOS 1,346*  --  1,285* 1,347*  --  1,282* 1,158*  BILITOT 2.7*   < > 3.0* 3.0* 2.9* 3.0* 2.7*  PROT 5.5*  --  5.2* 5.1*  --  5.1* 4.7*  ALBUMIN 1.7*  --  1.6* 1.6*  --  1.5* 1.6*   < > = values in this interval not displayed.    CBG: Recent Labs  Lab 11/07/19 0410 11/07/19 0526 11/07/19 0821 11/07/19 1159  11/08/19 1113  GLUCAP 78 95 89 95 122*     No results found for this or any previous visit (from the past 240 hour(s)).       Radiology Studies: IR PICC PLACEMENT RIGHT >5 YRS INC IMG GUIDE  Result Date: 11/10/2019 INDICATION: Patient with history of likely primary peritoneal carcinoma, poor venous access ; central venous access requested for chemotherapy. EXAM: ULTRASOUND AND FLUOROSCOPIC GUIDED PICC LINE INSERTION MEDICATIONS: 1% lidocaine to skin and subcutaneous tissue ANESTHESIA/SEDATION: None FLUOROSCOPY TIME:  Fluoroscopy Time:  42 seconds (7 mGy). COMPLICATIONS: None immediate. TECHNIQUE: The procedure, risks, benefits, and alternatives were explained to the patient and informed written consent was obtained. Lavell Supple timeout was performed prior to the initiation of the procedure. The right upper extremity was prepped with chlorhexidine in Lenea Bywater sterile fashion, and Malaya Cagley sterile drape was applied covering the operative field. Maximum barrier sterile technique with sterile gowns and gloves were used for the procedure. Grayer Sproles timeout was performed prior to the initiation of the procedure. Local anesthesia was provided with 1% lidocaine. Under direct ultrasound guidance, the right brachial vein was accessed with Vega Withrow micropuncture kit after the overlying soft tissues were anesthetized with 1% lidocaine. An ultrasound image was saved for documentation purposes. Loveda Colaizzi guidewire was advanced to the level of the superior caval-atrial junction for measurement purposes and the PICC line was cut to length. Darla Mcdonald peel-away sheath was placed and Verl Whitmore 39 cm, 5 [place], dual lumen was inserted to level of the superior caval-atrial junction. Keonia Pasko post procedure spot fluoroscopic was obtained. The catheter easily aspirated and flushed and was sutured in place. Michell Giuliano dressing was placed. The patient tolerated the procedure well without immediate post procedural complication. FINDINGS: After catheter placement, the tip lies within the superior  cavoatrial junction the catheter aspirates and flushes normally and is ready for immediate use. IMPRESSION: Successful ultrasound and fluoroscopic guided placement of Syriana Croslin right brachial vein approach, 39 cm, 5 French, dual lumen PICC with tip at the superior caval-atrial junction. The PICC line is ready for immediate use. Read by: Rowe Robert, PA-C Electronically Signed   By: Aletta Edouard M.D.   On: 11/10/2019 13:39        Scheduled Meds: . Chlorhexidine Gluconate Cloth  6 each Topical Daily  . citalopram  10 mg Oral Daily  . feeding supplement (ENSURE ENLIVE)  237 mL Oral  TID BM  . fluticasone  1 spray Each Nare BID  . lactulose  30 g Oral TID  . lidocaine      . nystatin  5 mL Mouth/Throat QID  . nystatin   Topical TID  . pantoprazole  40 mg Oral Q supper   Continuous Infusions:   LOS: 11 days    Time spent: over 30 min    Fayrene Helper, MD [facility]   To contact the attending provider between 7A-7P or the covering provider during after hours 7P-7A, please log into the web site [email or web address] and access using universal [place] password for that web site. If you do not have the password, please call the hospital operator.  11/10/2019, 2:44 PM

## 2019-11-10 NOTE — Progress Notes (Addendum)
HEMATOLOGY-ONCOLOGY PROGRESS NOTE  SUBJECTIVE: Much more alert today.  Husband is at the bedside.  States that she is hungry.  She is not having any abdominal pain this morning.  Denies nausea vomiting.  PICC line placement attempted by IV team last Friday but it could not be placed.  She is awaiting PICC line placement by IR today.  PHYSICAL EXAMINATION:  Vitals:   11/09/19 2000 11/10/19 0459  BP: (!) 95/55 105/64  Pulse: 92 85  Resp: 18 18  Temp: 97.9 F (36.6 C) 97.8 F (36.6 C)  SpO2: 97% 98%   Filed Weights   11/07/19 2041 11/08/19 2139 11/09/19 1609  Weight: 120.1 kg 120.1 kg 123.4 kg    Intake/Output from previous day: 05/23 0701 - 05/24 0700 In: 839.8 [P.O.:240; I.V.:599.8] Out: 475 [Urine:475]  Lungs: Clear anteriorly Cardiac: Regular rate and rhythm ABDOMEN: Positive bowel sounds, abdomen nontender, hepatomegaly noted NEURO: Alert and oriented x3 Vascular: Pitting edema to the bilateral lower extremities  LABORATORY DATA:  I have reviewed the data as listed CMP Latest Ref Rng & Units 11/10/2019 11/08/2019 11/07/2019  Glucose 70 - 99 mg/dL 103(H) 133(H) -  BUN 8 - 23 mg/dL 24(H) 19 -  Creatinine 0.44 - 1.00 mg/dL 0.60 0.73 -  Sodium 135 - 145 mmol/L 136 139 -  Potassium 3.5 - 5.1 mmol/L 4.4 3.8 -  Chloride 98 - 111 mmol/L 107 108 -  CO2 22 - 32 mmol/L 20(L) 18(L) -  Calcium 8.9 - 10.3 mg/dL 7.9(L) 8.7(L) -  Total Protein 6.5 - 8.1 g/dL 4.7(L) 5.1(L) -  Total Bilirubin 0.3 - 1.2 mg/dL 2.7(H) 3.0(H) 2.9(H)  Alkaline Phos 38 - 126 U/L 1,158(H) 1,282(H) -  AST 15 - 41 U/L 199(H) 313(H) -  ALT 0 - 44 U/L 62(H) 87(H) -    Lab Results  Component Value Date   WBC 19.5 (H) 11/10/2019   HGB 9.4 (L) 11/10/2019   HCT 30.8 (L) 11/10/2019   MCV 100.7 (H) 11/10/2019   PLT 227 11/10/2019   NEUTROABS 20.0 (H) 11/08/2019    CT CHEST WO CONTRAST  Result Date: 10/30/2019 CLINICAL DATA:  Metastatic malignancy EXAM: CT CHEST WITHOUT CONTRAST TECHNIQUE: Multidetector CT  imaging of the chest was performed following the standard protocol without IV contrast. COMPARISON:  10/29/2019, 06/18/2019 FINDINGS: Cardiovascular: No pericardial effusion. Normal caliber of the thoracic aorta. Minimal atherosclerosis. Mediastinum/Nodes: Numerous enlarged mediastinal lymph nodes are identified. Index lymph node in the right paratracheal region measures 21 x 21 mm, previous having measured 27 x 22 mm. Increased number of enlarged lymph nodes throughout the mediastinum compared to prior neck CT. The trachea, esophagus, and thyroid are unremarkable. Lungs/Pleura: There is a small spiculated sub solid nodule in the right upper lobe image 35, measuring 5 mm. No other pulmonary nodules or masses. Mild upper lobe predominant emphysema. Hypoventilatory changes at the lung bases. No large effusion or pneumothorax. Upper Abdomen: Please refer to recent abdominal CT describing numerous hepatic metastases as well as omental caking and trace ascites. Musculoskeletal: There are no acute or destructive bony lesions. Reconstructed images demonstrate no additional findings. IMPRESSION: 1. Nonspecific sub solid 5 mm right upper lobe pulmonary nodule. Neoplasm cannot be excluded in light of findings elsewhere within the chest and abdomen. 2. Extensive mediastinal lymphadenopathy worrisome for nodal metastases. 3. Aortic Atherosclerosis (ICD10-I70.0) and Emphysema (ICD10-J43.9). 4. Please refer to recent abdominal CT describing diffuse hepatic metastases. Electronically Signed   By: Randa Ngo M.D.   On: 10/30/2019 21:56  MR BRAIN W WO CONTRAST  Result Date: 10/31/2019 CLINICAL DATA:  Weakness, metastases EXAM: MRI HEAD WITHOUT AND WITH CONTRAST TECHNIQUE: Multiplanar, multiecho pulse sequences of the brain and surrounding structures were obtained without and with intravenous contrast. CONTRAST:  60mL GADAVIST GADOBUTROL 1 MMOL/ML IV SOLN COMPARISON:  09/30/2018 FINDINGS: Motion artifact is present Brain:  There is no acute infarction or intracranial hemorrhage. There is no intracranial mass, mass effect, or edema. There is no hydrocephalus or extra-axial fluid collection. Ventricles and sulci are stable in size and configuration. Patchy T2 hyperintensity in the supratentorial and pontine white matter is nonspecific but probably reflects mild chronic microvascular ischemic changes. No abnormal enhancement. Vascular: Major vessel flow voids at the skull base are preserved. Skull and upper cervical spine: Normal marrow signal is preserved. There is susceptibility artifact related to partially imaged cervical spine fusion. Sinuses/Orbits: Paranasal sinuses are aerated. Orbits are unremarkable. Other: Sella is unremarkable.  Mastoid air cells are clear. IMPRESSION: Suboptimal evaluation due to motion artifact. No evidence of intracranial metastatic disease. No acute infarction or hemorrhage. Electronically Signed   By: Macy Mis M.D.   On: 10/31/2019 14:24   CT ABDOMEN PELVIS W CONTRAST  Result Date: 10/29/2019 CLINICAL DATA:  Hyperkalemia,, liver disease, emesis, weight loss EXAM: CT ABDOMEN AND PELVIS WITH CONTRAST TECHNIQUE: Multidetector CT imaging of the abdomen and pelvis was performed using the standard protocol following bolus administration of intravenous contrast. CONTRAST:  66mL OMNIPAQUE IOHEXOL 300 MG/ML  SOLN COMPARISON:  06/17/2019, 01/05/2006 FINDINGS: Lower chest: No acute pleural or parenchymal lung disease. Hepatobiliary: There are innumerable masses replacing the majority of the liver parenchyma consistent with diffuse metastatic disease. The gallbladder is surgically absent. Pancreas: Unremarkable. No pancreatic ductal dilatation or surrounding inflammatory changes. Spleen: Normal in size without focal abnormality. Adrenals/Urinary Tract: Adrenal glands are unremarkable. Kidneys are normal, without renal calculi, focal lesion, or hydronephrosis. Bladder is unremarkable. Stomach/Bowel: No  bowel obstruction or ileus. No bowel wall thickening or inflammatory changes. Vascular/Lymphatic: There is pathologic adenopathy within the retroperitoneum. Largest pathologic lymph node in the left external iliac chain reference image 77 measures 3.5 x 3.1 cm. A second index lymph node at the right iliac bifurcation measures 2.6 x 2.2 cm reference image 62. There is minimal atherosclerosis of the aorta. Reproductive: Uterus is surgically absent. I do not see any adnexal lesions. Other: There is soft tissue fat stranding throughout the ventral omentum consistent with omental caking. Complex cystic mass within the left lower quadrant along the anterior abdominal wall measuring 5.7 x 7.2 cm is most consistent with a necrotic mass. Mural thickening along the anterior and lateral margin of the mass. There is trace free fluid within the upper abdomen surrounding the liver and spleen. No free intra-abdominal gas. Musculoskeletal: There are no acute or destructive bony lesions. Reconstructed images demonstrate no additional findings. IMPRESSION: 1. Diffuse hepatic metastases. 2. Omental caking. Complex cystic mass left lower quadrant consistent with necrotic lesion. 3. Pathologic retroperitoneal lymphadenopathy, most pronounced within the iliac distributions left greater than right. 4. Trace ascites. Electronically Signed   By: Randa Ngo M.D.   On: 10/29/2019 22:15   US BIOPSY (LIVER)  Result Date: 10/31/2019 INDICATION: 74 year old female with multifocal hepatic lesions concerning for metastatic disease. She presents for ultrasound-guided biopsy of the same. EXAM: ULTRASOUND BIOPSY CORE LIVER MEDICATIONS: None. ANESTHESIA/SEDATION: Moderate (conscious) sedation was employed during this procedure. A total of Versed 1 mg and Fentanyl 50 mcg was administered intravenously. Moderate Sedation Time: 13 minutes. The patient's  level of consciousness and vital signs were monitored continuously by radiology nursing  throughout the procedure under my direct supervision. FLUOROSCOPY TIME:  None. COMPLICATIONS: None immediate. PROCEDURE: Informed written consent was obtained from the patient after a thorough discussion of the procedural risks, benefits and alternatives. All questions were addressed. A timeout was performed prior to the initiation of the procedure. The liver was interrogated with ultrasound. There are innumerable hypoechoic solid lesions scattered throughout the liver. A suitable lesion in the left hepatic lobe was identified. The overlying skin was sterilely prepped and draped in the standard fashion. Local anesthesia was attained by infiltration with 1% lidocaine. A small dermatotomy was made. Under real-time ultrasound guidance, multiple 18 gauge core biopsies were obtained using the bio Pince automated biopsy device. Biopsies were performed coaxially through a 17 gauge introducer needle. As the introducer needle was withdrawn, the biopsy tract was embolized with a Gel-Foam slurry. Post ultrasound imaging demonstrates no evidence of immediate complication. IMPRESSION: Technically successful ultrasound-guided core biopsy of liver lesion. Electronically Signed   By: Jacqulynn Cadet M.D.   On: 10/31/2019 12:09   DG CHEST PORT 1 VIEW  Result Date: 11/05/2019 CLINICAL DATA:  Acute respiratory distress EXAM: PORTABLE CHEST 1 VIEW COMPARISON:  06/17/2019 FINDINGS: Single frontal view of the chest demonstrates an enlarged cardiac silhouette. Lung volumes are diminished. Mild increased interstitial prominence with central vascular congestion consistent with mild fluid overload. Small right pleural effusion. No pneumothorax. IMPRESSION: 1. Findings suggesting mild interstitial edema. Electronically Signed   By: Randa Ngo M.D.   On: 11/05/2019 22:51   DG Swallowing Func-Speech Pathology  Result Date: 11/06/2019 Objective Swallowing Evaluation: Type of Study: MBS-Modified Barium Swallow Study  Patient Details  Name: WONNIE FETCHO MRN: [id number] Date of Birth: July 12, 1945 Today's Date: 11/06/2019 Time: SLP Start Time (ACUTE ONLY): 58 -SLP Stop Time (ACUTE ONLY): 1300 SLP Time Calculation (min) (ACUTE ONLY): 30 min Past Medical History: Past Medical History: Diagnosis Date . ADD (attention deficit disorder)  . Anxiety  . Arthritis   os . Breast cancer ([place])  . Diabetes mellitus without complication ([place])  . GERD (gastroesophageal reflux disease)   barretts . Headache  . High cholesterol  . Neck pain  . Obesity  . Personal history of radiation therapy 2005 . Sleep apnea   cpap   last sleep study> 3 yrs . Swelling of both ankles  . Weakness 06/2019 Past Surgical History: Past Surgical History: Procedure Laterality Date . ABDOMINAL HYSTERECTOMY   . ANTERIOR CERVICAL DECOMP/DISCECTOMY FUSION N/A 02/15/2016  Procedure: Cervical three four-Cervical five-six, Cervical six-seven Anterior cervical decompression/diskectomy/fusion;  Surgeon: Kristeen Miss, MD;  Location: [facility];  Service: Neurosurgery;  Laterality: N/A; . bicept surgery   . BREAST LUMPECTOMY   . CHOLECYSTECTOMY   . JOINT REPLACEMENT   . PAROTID GLAND TUMOR EXCISION   . TONSILLECTOMY   . TOTAL KNEE ARTHROPLASTY  07/19/2012  Procedure: TOTAL KNEE ARTHROPLASTY;  Surgeon: Yvette Rack., MD;  Location: [facility];  Service: Orthopedics;  Laterality: Right;  right total knee arthroplasty . TUBAL LIGATION   HPI: Pt is a 74 year old female admitted with medical decline and abnormal labs, found to have signs of malignant breast cancer with hepatic metastases. Admitted with subacute abdominal pain waxing waning over 3 months more acutely 3 weeks ago developed worsening weakness poor appetite nausea.  Has had difficulty swallowing with some question of a neck mass and thrust. CT shows Extensive mediastinal lymphadenopathy worrisome for nodal metastases.  No data recorded Assessment / Plan / Recommendation CHL IP CLINICAL IMPRESSIONS 11/06/2019 Clinical Impression  Pt  demonstrates mild oropharyngeal dysphagia, appearing primarily due to decreased awareness and mentation. Pt kept her eyes closed during session but followed commands as needed, When taking sips, there were instances of brief oral holding and also slight premature spillage. Airway closure was not complete at times during the swallow leading to instances of flash frank penetration. Typically no cough was elicited during these events, but as study progressed pt coughed more and mroe often, not appearing to be related to pharyngeal impairment. Esophageal sweep showed some distal residue and pill needed several bolus to fully clear. Pt is capable of regular solids and thin liquids but she should only eat and drink when fully alert. Will f/u for tolerance of diet and further modifications as needed.  SLP Visit Diagnosis Dysphagia, unspecified (R13.10) Attention and concentration deficit following -- Frontal lobe and executive function deficit following -- Impact on safety and function Mild aspiration risk;Risk for inadequate nutrition/hydration   CHL IP TREATMENT RECOMMENDATION 11/06/2019 Treatment Recommendations Therapy as outlined in treatment plan below   No flowsheet data found. CHL IP DIET RECOMMENDATION 11/06/2019 SLP Diet Recommendations -- Liquid Administration via Cup;Straw Medication Administration Whole meds with liquid Compensations -- Postural Changes Seated upright at 90 degrees;Remain semi-upright after after feeds/meals (Comment)   No flowsheet data found.  CHL IP FOLLOW UP RECOMMENDATIONS 11/06/2019 Follow up Recommendations 24 hour supervision/assistance   CHL IP FREQUENCY AND DURATION 11/06/2019 Speech Therapy Frequency (ACUTE ONLY) min 1 x/week Treatment Duration 1 week      CHL IP ORAL PHASE 11/06/2019 Oral Phase Impaired Oral - Pudding Teaspoon -- Oral - Pudding Cup -- Oral - Honey Teaspoon -- Oral - Honey Cup -- Oral - Nectar Teaspoon -- Oral - Nectar Cup -- Oral - Nectar Straw -- Oral - Thin Teaspoon  Premature spillage;Decreased bolus cohesion Oral - Thin Cup -- Oral - Thin Straw Delayed oral transit Oral - Puree Delayed oral transit Oral - Mech Soft -- Oral - Regular Delayed oral transit Oral - Multi-Consistency -- Oral - Pill -- Oral Phase - Comment --  CHL IP PHARYNGEAL PHASE 11/06/2019 Pharyngeal Phase Impaired Pharyngeal- Pudding Teaspoon -- Pharyngeal -- Pharyngeal- Pudding Cup -- Pharyngeal -- Pharyngeal- Honey Teaspoon -- Pharyngeal -- Pharyngeal- Honey Cup -- Pharyngeal -- Pharyngeal- Nectar Teaspoon -- Pharyngeal -- Pharyngeal- Nectar Cup -- Pharyngeal -- Pharyngeal- Nectar Straw -- Pharyngeal -- Pharyngeal- Thin Teaspoon Penetration/Aspiration before swallow;Penetration/Aspiration during swallow Pharyngeal Material enters airway, remains ABOVE vocal cords then ejected out Pharyngeal- Thin Cup -- Pharyngeal -- Pharyngeal- Thin Straw Penetration/Aspiration before swallow;Penetration/Aspiration during swallow Pharyngeal Material enters airway, CONTACTS cords and then ejected out;Material enters airway, remains ABOVE vocal cords then ejected out;Material does not enter airway Pharyngeal- Puree WFL Pharyngeal -- Pharyngeal- Mechanical Soft -- Pharyngeal -- Pharyngeal- Regular WFL Pharyngeal -- Pharyngeal- Multi-consistency -- Pharyngeal -- Pharyngeal- Pill WFL Pharyngeal -- Pharyngeal Comment --  No flowsheet data found. DeBlois, Katherene Ponto 11/06/2019, 2:48 PM              VAS [place] UPPER EXTREMITY VENOUS DUPLEX  Result Date: 11/07/2019 UPPER VENOUS STUDY  Indications: Edema Limitations: Body habitus, poor ultrasound/tissue interface and patient unable to cooperate. Patient position. Comparison Study: No prior study Performing Technologist: Maudry Mayhew MHA, RDMS, RVT, RDCS  Examination Guidelines: A complete evaluation includes B-mode imaging, spectral Doppler, color Doppler, and power Doppler as needed of all accessible portions of each vessel. Bilateral testing is considered  an integral part  of a complete examination. Limited examinations for reoccurring indications may be performed as noted.  Right Findings: +----------+------------+---------+-----------+----------+--------------+ RIGHT     CompressiblePhasicitySpontaneousProperties   Summary     +----------+------------+---------+-----------+----------+--------------+ Subclavian                                          Not visualized +----------+------------+---------+-----------+----------+--------------+  Left Findings: +----------+------------+---------+-----------+----------+--------------+ LEFT      CompressiblePhasicitySpontaneousProperties   Summary     +----------+------------+---------+-----------+----------+--------------+ IJV           Full       Yes       Yes                             +----------+------------+---------+-----------+----------+--------------+ Subclavian    Full       Yes       Yes                             +----------+------------+---------+-----------+----------+--------------+ Axillary      Full       Yes       Yes                             +----------+------------+---------+-----------+----------+--------------+ Brachial      Full       Yes       Yes                             +----------+------------+---------+-----------+----------+--------------+ Radial        Full                                                 +----------+------------+---------+-----------+----------+--------------+ Ulnar                                               Not visualized +----------+------------+---------+-----------+----------+--------------+ Cephalic      Full                                                 +----------+------------+---------+-----------+----------+--------------+ Basilic                                             Not visualized +----------+------------+---------+-----------+----------+--------------+  Summary:  Left: No evidence of deep  vein thrombosis in the upper extremity. No evidence of superficial vein thrombosis in the upper extremity. This study was limited due to technical limitations listed above.  *See table(s) above for measurements and observations.  Diagnosing physician: Servando Snare MD Electronically signed by Servando Snare MD on 11/07/2019 at 3:03:30 PM.    Final    [place] EKG SITE RITE  Result Date: 11/07/2019 If Site Rite image not attached, placement could not be  confirmed due to current cardiac rhythm.   ASSESSMENT AND PLAN: 1.  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  Primary peritoneal carcinoma favored given history of hysterectomy with negative pathology.  -10/29/2019 CEA 4.8, CA 19.9 441, AFP 2.1   -10/30/2019 CA 27.29 6728  -11/06/2019 CA-125 14,072 2.  Elevated total bilirubin and LFTs 3.  AKI secondary to dehydration 4.  Hypercalcemia-status post Zometa and calcitonin, improved 5.  Deconditioning 6.  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  Diabetes mellitus 8.  Temporal arteritis, status post treatment 9.  Migraine headaches 10.  Leg weakness-steroid myopathy?,  Paraneoplastic syndrome?  Ms Onder is much more alert today.  Discussed plan for chemotherapy consisting of carboplatin and paclitaxel.  The patient is stated that she wants to proceed with treatment.  Adverse effects of treatment have been discussed with the patient and her husband again today including but not limited to nausea, vomiting, myelosuppression, peripheral neuropathy, liver kidney dysfunction, and possible infusion reaction.  She agrees to proceed.  We discussed that we have to wait for PICC line placement.  If the PICC line will be placed after noon time, then we will likely not proceed with chemotherapy until tomorrow.  Recommendations: 1.  Await  PICC line placement. 2.  We will plan to proceed with dose reduced carboplatin and Taxol once PICC line is placed.  If the PICC line will not be placed until later today, we will proceed with first dose of chemotherapy on 11/11/2019.   LOS: 11 days   Mikey Bussing 11/10/19 Ms. Margaretmary Bayley was interviewed and examined.  She is alert this morning.  She understands the cancer diagnosis and would like to proceed with a trial of chemotherapy.  She understands the chemotherapy is palliative.  We reviewed potential toxicities associated with the Taxol regimen including the chance of an allergic reaction, alopecia, hematologic toxicity, bone pain, and neuropathy.  We discussed the potential for nausea/vomiting with the Taxol/carboplatin regimen.  She is at increased risk for toxicity due to her poor performance status and elevated liver enzymes.  She agrees to proceed.  I reviewed the regimen with the [facility] pharmacy.  The plan is to begin Taxol/carboplatin today.

## 2019-11-11 LAB — URINALYSIS, ROUTINE W REFLEX MICROSCOPIC
Bilirubin Urine: NEGATIVE
Glucose, UA: NEGATIVE mg/dL
Hgb urine dipstick: NEGATIVE
Ketones, ur: 5 mg/dL — AB
Nitrite: NEGATIVE
Protein, ur: 30 mg/dL — AB
Specific Gravity, Urine: 1.024 (ref 1.005–1.030)
pH: 6 (ref 5.0–8.0)

## 2019-11-11 LAB — COMPREHENSIVE METABOLIC PANEL
ALT: 57 U/L — ABNORMAL HIGH (ref 0–44)
AST: 178 U/L — ABNORMAL HIGH (ref 15–41)
Albumin: 2 g/dL — ABNORMAL LOW (ref 3.5–5.0)
Alkaline Phosphatase: 1261 U/L — ABNORMAL HIGH (ref 38–126)
Anion gap: 11 (ref 5–15)
BUN: 26 mg/dL — ABNORMAL HIGH (ref 8–23)
CO2: 20 mmol/L — ABNORMAL LOW (ref 22–32)
Calcium: 8.3 mg/dL — ABNORMAL LOW (ref 8.9–10.3)
Chloride: 106 mmol/L (ref 98–111)
Creatinine, Ser: 0.64 mg/dL (ref 0.44–1.00)
GFR calc Af Amer: >60 mL/min (ref >60)
GFR calc non Af Amer: >60 mL/min (ref >60)
Glucose, Bld: 125 mg/dL — ABNORMAL HIGH (ref 70–99)
Potassium: 4.1 mmol/L (ref 3.5–5.1)
Sodium: 137 mmol/L (ref 135–145)
Total Bilirubin: 2.1 mg/dL — ABNORMAL HIGH (ref 0.3–1.2)
Total Protein: 5 g/dL — ABNORMAL LOW (ref 6.5–8.1)

## 2019-11-11 LAB — CBC WITH DIFFERENTIAL/PLATELET
Abs Immature Granulocytes: 0.92 10*3/uL — ABNORMAL HIGH (ref 0.00–0.07)
Basophils Absolute: 0 10*3/uL (ref 0.0–0.1)
Basophils Relative: 0 %
Eosinophils Absolute: 0 10*3/uL (ref 0.0–0.5)
Eosinophils Relative: 0 %
HCT: 30.4 % — ABNORMAL LOW (ref 36.0–46.0)
Hemoglobin: 9.3 g/dL — ABNORMAL LOW (ref 12.0–15.0)
Immature Granulocytes: 5 %
Lymphocytes Relative: 5 %
Lymphs Abs: 1 10*3/uL (ref 0.7–4.0)
MCH: 30.7 pg (ref 26.0–34.0)
MCHC: 30.6 g/dL (ref 30.0–36.0)
MCV: 100.3 fL — ABNORMAL HIGH (ref 80.0–100.0)
Monocytes Absolute: 1.5 10*3/uL — ABNORMAL HIGH (ref 0.1–1.0)
Monocytes Relative: 7 %
Neutro Abs: 17 10*3/uL — ABNORMAL HIGH (ref 1.7–7.7)
Neutrophils Relative %: 83 %
Platelets: 264 10*3/uL (ref 150–400)
RBC: 3.03 MIL/uL — ABNORMAL LOW (ref 3.87–5.11)
RDW: 26.5 % — ABNORMAL HIGH (ref 11.5–15.5)
WBC: 20.4 10*3/uL — ABNORMAL HIGH (ref 4.0–10.5)
nRBC: 0.1 % (ref 0.0–0.2)

## 2019-11-11 LAB — MAGNESIUM: Magnesium: 1.9 mg/dL (ref 1.7–2.4)

## 2019-11-11 LAB — PHOSPHORUS: Phosphorus: 2.1 mg/dL — ABNORMAL LOW (ref 2.5–4.6)

## 2019-11-11 MED ORDER — ALBUTEROL SULFATE (2.5 MG/3ML) 0.083% IN NEBU: 2.5000 mg | INHALATION_SOLUTION | Freq: Once | RESPIRATORY_TRACT | Status: DC | PRN

## 2019-11-11 MED ORDER — DIPHENHYDRAMINE HCL 50 MG/ML IJ SOLN: 25.0000 mg | Freq: Once | INTRAMUSCULAR | Status: DC | PRN

## 2019-11-11 MED ORDER — DIPHENHYDRAMINE HCL 50 MG/ML IJ SOLN
25.0000 mg | Freq: Once | INTRAMUSCULAR | Status: AC
  Administered 2019-11-11: 25 mg via INTRAVENOUS
  Filled 2019-11-11: qty 1

## 2019-11-11 MED ORDER — METHYLPREDNISOLONE SODIUM SUCC 125 MG IJ SOLR
125.0000 mg | Freq: Once | INTRAMUSCULAR | Status: DC | PRN
Start: 2019-11-11 — End: 2019-11-20

## 2019-11-11 MED ORDER — FUROSEMIDE 10 MG/ML IJ SOLN: 40.0000 mg | Freq: Two times a day (BID) | INTRAMUSCULAR | Status: DC

## 2019-11-11 MED ORDER — FAMOTIDINE IN NACL 20-0.9 MG/50ML-% IV SOLN
20.0000 mg | Freq: Once | INTRAVENOUS | Status: AC
  Administered 2019-11-11: 20 mg via INTRAVENOUS
  Filled 2019-11-11: qty 50

## 2019-11-11 MED ORDER — COLD PACK MISC ONCOLOGY
1.0000 | Freq: Once | Status: AC | PRN
  Filled 2019-11-11: qty 1

## 2019-11-11 MED ORDER — SODIUM CHLORIDE 0.9 % IV SOLN
248.2000 mg | Freq: Once | INTRAVENOUS | Status: AC
  Administered 2019-11-11: 250 mg via INTRAVENOUS
  Filled 2019-11-11: qty 25

## 2019-11-11 MED ORDER — ALTEPLASE 2 MG IJ SOLR
2.0000 mg | Freq: Once | INTRAMUSCULAR | Status: DC | PRN
Start: 2019-11-11 — End: 2019-11-20
  Filled 2019-11-11: qty 2

## 2019-11-11 MED ORDER — SODIUM CHLORIDE 0.9 % IV SOLN: Freq: Once | INTRAVENOUS | Status: AC

## 2019-11-11 MED ORDER — LACTULOSE 10 GM/15ML PO SOLN
30.0000 g | Freq: Every day | ORAL | Status: DC
  Administered 2019-11-11 – 2019-11-19 (×8): 30 g via ORAL
  Filled 2019-11-11 (×8): qty 45

## 2019-11-11 MED ORDER — SODIUM CHLORIDE 0.9 % IV SOLN
150.0000 mg | Freq: Once | INTRAVENOUS | Status: AC
  Administered 2019-11-11: 150 mg via INTRAVENOUS
  Filled 2019-11-11: qty 5

## 2019-11-11 MED ORDER — SODIUM CHLORIDE 0.9 % IV SOLN: Freq: Once | INTRAVENOUS | Status: DC | PRN

## 2019-11-11 MED ORDER — SODIUM CHLORIDE 0.9% FLUSH: 10.0000 mL | INTRAVENOUS | Status: DC | PRN

## 2019-11-11 MED ORDER — SODIUM CHLORIDE 0.9 % IV SOLN
10.0000 mg | Freq: Once | INTRAVENOUS | Status: AC
  Administered 2019-11-11: 10 mg via INTRAVENOUS
  Filled 2019-11-11: qty 1

## 2019-11-11 MED ORDER — HEPARIN SOD (PORK) LOCK FLUSH 100 UNIT/ML IV SOLN
250.0000 [IU] | Freq: Once | INTRAVENOUS | Status: AC | PRN
  Administered 2019-11-20: 250 [IU]
  Filled 2019-11-11: qty 2.5

## 2019-11-11 MED ORDER — SODIUM CHLORIDE 0.9% FLUSH: 3.0000 mL | INTRAVENOUS | Status: DC | PRN

## 2019-11-11 MED ORDER — SODIUM CHLORIDE 0.9 % IV SOLN
80.0000 mg/m2 | Freq: Once | INTRAVENOUS | Status: AC
  Administered 2019-11-11: 186 mg via INTRAVENOUS
  Filled 2019-11-11: qty 31

## 2019-11-11 MED ORDER — EPINEPHRINE 0.3 MG/0.3ML IJ SOAJ
0.3000 mg | Freq: Once | INTRAMUSCULAR | Status: DC | PRN
  Filled 2019-11-11: qty 0.6

## 2019-11-11 MED ORDER — HEPARIN SOD (PORK) LOCK FLUSH 100 UNIT/ML IV SOLN
500.0000 [IU] | Freq: Once | INTRAVENOUS | Status: DC | PRN
  Filled 2019-11-11: qty 5

## 2019-11-11 MED ORDER — PALONOSETRON HCL INJECTION 0.25 MG/5ML
0.2500 mg | Freq: Once | INTRAVENOUS | Status: AC
  Administered 2019-11-11: 0.25 mg via INTRAVENOUS
  Filled 2019-11-11: qty 5

## 2019-11-11 MED ORDER — FAMOTIDINE IN NACL 20-0.9 MG/50ML-% IV SOLN: 20.0000 mg | Freq: Once | INTRAVENOUS | Status: DC | PRN

## 2019-11-11 NOTE — Care Management Important Message (Signed)
Important Message  Patient Details IM Letter given to [name] RN Case Manager to present to the Patient Name: [name] MRN: [id number] Date of Birth: 1945/11/17   Medicare Important Message Given:  Yes     [name] 11/11/2019, 10:25 AM

## 2019-11-11 NOTE — Progress Notes (Signed)
PROGRESS NOTE    [name]  EXH:[id number] DOB: 04/24/1946 DOA: 10/29/2019 PCP: Leonides Sake, MD  Chief Complaint  Patient presents with  . Abdominal Pain  . Abnormal Lab    Brief Narrative:  74 year old female with history of DM-two, temporal arteritis, migraine, OSA and cervical radiculopathy admitted with waxing and waning abdominal pain for 3 months that has gotten worse over the last 3 weeks, generalized weakness, poor appetite and nausea. Presented to PCP who was concerned about cancer. She was directed to ED. In ED, found to have hepatic metastasis, omental caking, complex cystic left lower quadrant mass retroperitoneal adenopathy. Also found to have hyponatremia, hypocalcemia and AKI. Underwent liver biopsy by IR on 5/14. 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 Oncology and palliative care consulted. Oncology planning to start chemotherapy the week of 5/24.    Assessment & Plan:   Principal Problem:   Metastatic neoplastic disease ([place]) Active Problems:   Uncontrolled type 2 diabetes mellitus with hyperglycemia, with long-term current use of insulin (HCC)   Hypercalcemia   AKI (acute kidney injury) ([place])   Dehydration with hyponatremia   GERD without esophagitis   Metastatic malignant neoplasm [facility])   Palliative care by specialist   Goals of care, counseling/discussion   DNR (do not resuscitate)   Hypercalcemia of malignancy   Hepatic encephalopathy ([place])   Palliative care encounter   Pressure injury of skin   Hepatic dysfunction  Metastatic primary peritoneal serous carcinoma- Liver biopsy on 5/14 reported as "most consistent with primary ovarian or  primary peritoneal serous carcinom". Oncology suspects the later. -Plan to transfer to [facility], oncology floor in anticipation for chemo beginning 5/24 - plan for carboplatin/taxol per oncology -IR consulted for PICC line placement, s/p  placement on 5/24.  PICC team was not able to place on 5/22.  Acute metabolic encephalopathy: hepatic encephalopathy? Ammonia 61 >> 86> 58>67.  MRI brain, ABG, TSH, B12, [place].m. cortisol and ACTH stimulation test unrevealing. Has leukocytosis likely from recent steroid.  Afebrile.  No focal neuro deficits. -lactulose decreased freq by oncology, continue to follow mental status -Recheck ammonia level - 62 -Frequent orientation and delirium precautions - mental status is improved per discussion with husband, continue to monitor   Elevated liver enzymes/hyperbilirubinemia/elevated alkaline phosphatase: CK within normal.  GGT elevated.  In setting of hepatic metastasis. Both direct and indirect bilirubin elevated. -Continue monitoring  Volume depletion/AKI/hyperkalemia:  Resolved  Volume Overload  Anasarca: Now she appears fluid with 1+ edema in all extremities.  In setting  - needs diuresis - per oncology, diuresis once chemo complete - would follow up regarding timing with oncology  Non-anion gap metabolic acidosis: Unclear source. -Continue monitoring  Hypophosphatemia -Replenished with sodium phosphate -Recheck and replenish as appropriate  Hyperglycemia: Not diabetic.  A1c 4.1%. -Discontinued CBG monitoring and SSI.  Hypercalcemia-likely due to dehydration and possibly from malignancy.  Received Zometa x1. Improved, continue to monitor.  Corrected calcium 9.8.  History of temporal arteritis: Stable -Just came off steroids recently.  Leukocytosis/bandemia: Afebrile.  No obvious signs of infection.  She was on steroids prior to admission but the effect shouldn't last this long -Consider infectious work-up if no improvement.  Morbid obesity: Body mass index is 45.45 kg/m.  Dysuria: c/o dysuria since foley placed, ?related to foley irritation.  Will follow UA and culture.  Goal of care/DNR/DNI-appropriate. Initially plan was to discharge to [facility] but oncology  would like to start chemotherapy  now.  Still poor prognosis. -Palliative medicine following.  DVT prophylaxis: SCD Code Status: DNR Family Communication: none at bedside Disposition:   Status is: Inpatient  Remains inpatient appropriate because:IV treatments appropriate due to intensity of illness or inability to take PO and Inpatient level of care appropriate due to severity of illness   Dispo: The patient is from: Home              Anticipated d/c is to: to be determined              Anticipated d/c date is: > 3 days              Patient currently is not medically stable to d/c.   Consultants:   Oncology  Palliative  IR  Procedures:  5/14-liver biopsy by IR. Pathology result most consistent with primary ovarian or  primary peritoneal serous carcinoma.  5/24 PICC placement by IR  Antimicrobials:  Anti-infectives (From admission, onward)   Start     Dose/Rate Route Frequency Ordered Stop   11/01/19 1615  fluconazole (DIFLUCAN) tablet 150 mg     150 mg Oral  Once 11/01/19 1609 11/01/19 1713     Subjective: C/o dysuria since foley was placed  Objective: Vitals:   11/11/19 1344 11/11/19 1404 11/11/19 1432 11/11/19 1924  BP: 116/70 121/69 117/68   Pulse: 87 77 75 89  Resp: _0 Temp: 97.7 F (36.5 C) 97.6 F (36.4 C) 98.1 F (36.7 C)   TempSrc: Oral Oral Oral   SpO2: 97% 97% 95% 97%  Weight:      Height:        Intake/Output Summary (Last 24 hours) at 11/11/2019 1952 Last data filed at 11/11/2019 1616 Gross per 24 hour  Intake 240 ml  Output 1300 ml  Net -1060 ml   Filed Weights   11/07/19 2041 11/08/19 2139 11/09/19 1609  Weight: 120.1 kg 120.1 kg 123.4 kg    Examination:  General: No acute distress. Cardiovascular: Heart sounds show Roneka Gilpin regular rate, and rhythm. Lungs: Clear to auscultation bilaterally  Abdomen: Soft, nontender, nondistended  Neurological: Alert and oriented 3. Moves all extremities 4. Cranial nerves II through XII  grossly intact. Skin: Warm and dry. No rashes or lesions. Extremities: anasarca, diffuse edema    Data Reviewed: I have personally reviewed following labs and imaging studies  CBC: Recent Labs  Lab 11/05/19 0608 11/05/19 0608 11/06/19 0449 11/07/19 0801 11/08/19 1008 11/10/19 0443 11/11/19 0543  WBC 15.1*   < > 17.8* 19.2* 24.6* 19.5* 20.4*  NEUTROABS 12.0*  --  13.8* 14.4* 20.0*  --  17.0*  HGB 12.7   < > 10.9* 11.5* 11.4* 9.4* 9.3*  HCT 41.4   < > 35.2* 37.4 37.0 30.8* 30.4*  MCV 98.3   < > 98.3 99.7 100.3* 100.7* 100.3*  PLT 279   < > 319 300 PLATELET CLUMPS NOTED ON SMEAR, UNABLE TO ESTIMATE 227 264   < > = values in this interval not displayed.    Basic Metabolic Panel: Recent Labs  Lab 11/06/19 0449 11/07/19 0457 11/08/19 1008 11/10/19 0443 11/11/19 0543  NA 141 143 139 136 137  K 4.3 3.9 3.8 4.4 4.1  CL 110 110 108 107 106  CO2 20* 19* 18* 20* 20*  GLUCOSE 109* 81 133* 103* 125*  BUN _1 24* 26*  CREATININE 0.89 0.75 0.73 0.60 0.64  CALCIUM 8.9 8.8* 8.7* 7.9* 8.3*  MG  --  1.8  1.8 1.9 1.9  PHOS  --   --  1.5* 2.6 2.1*    GFR: Estimated Creatinine Clearance: 82.5 mL/min (by C-G formula based on SCr of 0.64 mg/dL).  Liver Function Tests: Recent Labs  Lab 11/06/19 0449 11/06/19 0449 11/07/19 0457 11/07/19 1540 11/08/19 1008 11/10/19 0443 11/11/19 0543  AST 502*  --  470*  --  313* 199* 178*  ALT 89*  --  96*  --  87* 62* 57*  ALKPHOS 1,285*  --  1,347*  --  1,282* 1,158* 1,261*  BILITOT 3.0*   < > 3.0* 2.9* 3.0* 2.7* 2.1*  PROT 5.2*  --  5.1*  --  5.1* 4.7* 5.0*  ALBUMIN 1.6*  --  1.6*  --  1.5* 1.6* 2.0*   < > = values in this interval not displayed.    CBG: Recent Labs  Lab 11/07/19 0410 11/07/19 0526 11/07/19 0821 11/07/19 1159 11/08/19 1113  GLUCAP 78 95 89 95 122*     No results found for this or any previous visit (from the past 240 hour(s)).       Radiology Studies: IR PICC PLACEMENT RIGHT >5 YRS INC IMG  GUIDE  Result Date: 11/10/2019 INDICATION: Patient with history of likely primary peritoneal carcinoma, poor venous access ; central venous access requested for chemotherapy. EXAM: ULTRASOUND AND FLUOROSCOPIC GUIDED PICC LINE INSERTION MEDICATIONS: 1% lidocaine to skin and subcutaneous tissue ANESTHESIA/SEDATION: None FLUOROSCOPY TIME:  Fluoroscopy Time:  42 seconds (7 mGy). COMPLICATIONS: None immediate. TECHNIQUE: The procedure, risks, benefits, and alternatives were explained to the patient and informed written consent was obtained. Salima Rumer timeout was performed prior to the initiation of the procedure. The right upper extremity was prepped with chlorhexidine in Laurian Edrington sterile fashion, and Oneill Bais sterile drape was applied covering the operative field. Maximum barrier sterile technique with sterile gowns and gloves were used for the procedure. Alanii Ramer timeout was performed prior to the initiation of the procedure. Local anesthesia was provided with 1% lidocaine. Under direct ultrasound guidance, the right brachial vein was accessed with Roshad Hack micropuncture kit after the overlying soft tissues were anesthetized with 1% lidocaine. An ultrasound image was saved for documentation purposes. Kawon Willcutt guidewire was advanced to the level of the superior caval-atrial junction for measurement purposes and the PICC line was cut to length. Desirea Mizrahi peel-away sheath was placed and Temprence Rhines 39 cm, 5 [place], dual lumen was inserted to level of the superior caval-atrial junction. Nkenge Sonntag post procedure spot fluoroscopic was obtained. The catheter easily aspirated and flushed and was sutured in place. Robyn Nohr dressing was placed. The patient tolerated the procedure well without immediate post procedural complication. FINDINGS: After catheter placement, the tip lies within the superior cavoatrial junction the catheter aspirates and flushes normally and is ready for immediate use. IMPRESSION: Successful ultrasound and fluoroscopic guided placement of Reshonda Koerber right brachial vein approach, 39 cm,  5 French, dual lumen PICC with tip at the superior caval-atrial junction. The PICC line is ready for immediate use. Read by: Rowe Robert, PA-C Electronically Signed   By: Aletta Edouard M.D.   On: 11/10/2019 13:39        Scheduled Meds: . Chlorhexidine Gluconate Cloth  6 each Topical Daily  . citalopram  10 mg Oral Daily  . feeding supplement (ENSURE ENLIVE)  237 mL Oral TID BM  . fluticasone  1 spray Each Nare BID  . lactulose  30 g Oral Daily  . nystatin  5 mL Mouth/Throat QID  . nystatin   Topical TID  .  pantoprazole  40 mg Oral Q supper   Continuous Infusions: . sodium chloride    . famotidine (PEPCID) IV (ONCOLOGY)       LOS: 12 days    Time spent: over 30 min    Fayrene Helper, MD [facility]   To contact the attending provider between 7A-7P or the covering provider during after hours 7P-7A, please log into the web site [email or web address] and access using universal [place] password for that web site. If you do not have the password, please call the hospital operator.  11/11/2019, 7:52 PM

## 2019-11-11 NOTE — Progress Notes (Signed)
Physical Therapy Treatment Patient Details Name: [name] MRN: [id number] DOB: March 18, 1946 Today's Date: 11/11/2019   Clinical Impression: Patient recently transferred to [facility] to initiate chemotherapy for liver Mets discovered on admission to [facility] on 10/30/19. Patient requried min assist on PT eval on 10/30/19 for bed mobility to sit EOB and PTA had been performing transfers to [facility]. She has requried increased assist during admission due to increasing weakness. Pt has not sat up EOB since 5/17. She is more alert today compared to last session on 5/21 and pt/spouse are interested in progressing mobility again to OOB if able. Pt was able to sit EOB for ~5 minutes requiring total assist due to weakness. She was able to pull forward with Rt UE but not Lt due to weakness and it was not enough to shift weight anteriorly. Unable to progress to scoot or transfers today due to safety concerns and weakness. Total assist required to return to supine and reposition. Goals downgraded at this time to make achievable. Acute PT will continue to follow and progress as able. Recommend SNF level follow up for therapy.     11/11/19 1200  PT Visit Information  Last PT Received On 11/11/19  Assistance Needed +2  History of Present Illness Patient is 74 y.o. female with PMH significant for DM, temporal arteritis, breast cancer, OSA on CPAP, Rt TKA, and ACDF. Pt presented to ED at [facility] on 10/29/19 and was admitted with waxing and waning abdominal pain for 3 months that has gotten worse over the last 3 weeks, generalized weakness, poor appetite and nausea. In ED, pt found to have hepatic metastasis, omental caking, complex cystic LLQ mass retroperitoneal adenopathy. Pt underwent liver biopsy by IR on 5/14. 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 Pt transferred to [facility] on 5/23. IR was placed PICC line on 5/24 and pt initiated chemotherapy on 5/25.   Subjective Data   Patient Stated Goal to get stronger (pt/spouse stating)  Precautions  Precautions Fall  Restrictions  Weight Bearing Restrictions No  Pain Assessment  Pain Assessment Faces  Faces Pain Scale 2  Pain Location generalized discomfort  Pain Descriptors / Indicators Discomfort;Grimacing  Pain Intervention(s) Limited activity within patient's tolerance;Monitored during session;Repositioned  Cognition  Arousal/Alertness Awake/alert  Behavior During Therapy WFL for tasks assessed/performed  Overall Cognitive Status Impaired/Different from baseline  Memory Decreased short-term memory  Following Commands Follows one step commands consistently;Follows multi-step commands with increased time;Follows multi-step commands consistently  Problem Solving Decreased initiation  General Comments pt limited during session by faitgue, and pt's husband stepped out of the room and unable to provide baseline cognition. RN reports per Rn hand off pt is more alert and clear today than prior date. pt was oriented to self, name [facility] as place however pt recently transferred to [facility] and reminded of this. Appears orriented to situation.  Bed Mobility  Overal bed mobility Needs Assistance  Bed Mobility Rolling  Rolling Max assist  Supine to sit Total assist;+2 for physical assistance;+2 for safety/equipment;HOB elevated  Sit to supine Total assist;+2 for physical assistance;+2 for safety/equipment  General bed mobility comments Pt required Total Assist +2 for safety/physical assist with supine<>sit transfer. Verbal/tactile cues required to reach Lt UE to bed rail and assist at hip to facilitate upper/lower trunk rolling. Pt required Total assist to lower trunk and raise LE's into bed after sitting EOB for ~ 100m inute and fatiguing. Max assist to roll Rt/Lt in bed to reposition bed  pads with cues to flex knees and reach to bed rail. Pt able to initate LE mobility and reachign but assist required to complete trunk rolling.   Balance  Overall balance assessment Needs assistance  Sitting-balance support Feet unsupported;Bilateral upper extremity supported  Sitting balance-Leahy Scale Poor  Sitting balance - Comments pt heavily reliant on bil UE support and posterior support  PT - End of Session  Activity Tolerance Patient limited by lethargy  Patient left in bed;with call bell/phone within reach;with bed alarm set  Nurse Communication Mobility status   PT - Assessment/Plan  PT Plan Current plan remains appropriate  PT Visit Diagnosis Muscle weakness (generalized) (M62.81);Other abnormalities of gait and mobility (R26.89)  PT Frequency (ACUTE ONLY) Min 2X/week  Follow Up Recommendations SNF  PT equipment Hospital bed;Other (comment);Wheelchair (measurements PT);Wheelchair cushion (measurements PT) (hoyer lift)  AM-PAC PT "6 Clicks" Mobility Outcome Measure (Version 2)  Help needed turning from your back to your side while in a flat bed without using bedrails? 1  Help needed moving from lying on your back to sitting on the side of a flat bed without using bedrails? 1  Help needed moving to and from a bed to a chair (including a wheelchair)? 1  Help needed standing up from a chair using your arms (e.g., wheelchair or bedside chair)? 1  Help needed to walk in hospital room? 1  Help needed climbing 3-5 steps with a railing?  1  6 Click Score 6  Consider Recommendation of Discharge To: CIR/SNF/LTACH  PT Goal Progression  Progress towards PT goals Progressing toward goals;Goals downgraded-see care plan (goalsdowngraded due to decline, pt increased activity today)  Acute Rehab PT Goals  PT Goal Formulation With patient  Time For Goal Achievement 11/25/19  Potential to Achieve Goals Fair  PT Time Calculation  PT Start Time (ACUTE ONLY) 1206  PT Stop Time (ACUTE ONLY) 1227  PT Time Calculation (min) (ACUTE ONLY) 21 min  PT General Charges  $$ ACUTE PT VISIT 1 Visit  PT Treatments  $Therapeutic Activity 8-22  mins    Verner Mould, DPT Physical Therapist with [facility] [phone]  11/11/2019 7:02 PM

## 2019-11-11 NOTE — Progress Notes (Addendum)
HEMATOLOGY-ONCOLOGY PROGRESS NOTE  SUBJECTIVE: PICC line was placed by IR yesterday afternoon.  She denies abdominal pain, nausea, vomiting.  Still having loose stools related to lactulose.  Still with edema.  PHYSICAL EXAMINATION:  Vitals:   11/10/19 2115 11/11/19 0604  BP: 118/63 125/71  Pulse: 93 88  Resp: 20 20  Temp: 97.7 F (36.5 C) 97.7 F (36.5 C)  SpO2: 100% 98%   Filed Weights   11/07/19 2041 11/08/19 2139 11/09/19 1609  Weight: 120.1 kg 120.1 kg 123.4 kg    Intake/Output from previous day: 05/24 0701 - 05/25 0700 In: -  Out: 700 [Urine:700]  Lungs: Clear anteriorly Cardiac: Regular rate and rhythm ABDOMEN: Positive bowel sounds, abdomen nontender, hepatomegaly noted NEURO: Alert and oriented x3 Vascular: Pitting edema to the bilateral lower extremities  PICC without erythema or drainage, multiple ecchymoses near PICC line insertion site  LABORATORY DATA:  I have reviewed the data as listed CMP Latest Ref Rng & Units 11/11/2019 11/10/2019 11/08/2019  Glucose 70 - 99 mg/dL 125(H) 103(H) 133(H)  BUN 8 - 23 mg/dL 26(H) 24(H) 19  Creatinine 0.44 - 1.00 mg/dL 0.64 0.60 0.73  Sodium 135 - 145 mmol/L 137 136 139  Potassium 3.5 - 5.1 mmol/L 4.1 4.4 3.8  Chloride 98 - 111 mmol/L 106 107 108  CO2 22 - 32 mmol/L 20(L) 20(L) 18(L)  Calcium 8.9 - 10.3 mg/dL 8.3(L) 7.9(L) 8.7(L)  Total Protein 6.5 - 8.1 g/dL 5.0(L) 4.7(L) 5.1(L)  Total Bilirubin 0.3 - 1.2 mg/dL 2.1(H) 2.7(H) 3.0(H)  Alkaline Phos 38 - 126 U/L 1,261(H) 1,158(H) 1,282(H)  AST 15 - 41 U/L 178(H) 199(H) 313(H)  ALT 0 - 44 U/L 57(H) 62(H) 87(H)    Lab Results  Component Value Date   WBC 20.4 (H) 11/11/2019   HGB 9.3 (L) 11/11/2019   HCT 30.4 (L) 11/11/2019   MCV 100.3 (H) 11/11/2019   PLT 264 11/11/2019   NEUTROABS 17.0 (H) 11/11/2019    CT CHEST WO CONTRAST  Result Date: 10/30/2019 CLINICAL DATA:  Metastatic malignancy EXAM: CT CHEST WITHOUT CONTRAST TECHNIQUE: Multidetector CT imaging of the  chest was performed following the standard protocol without IV contrast. COMPARISON:  10/29/2019, 06/18/2019 FINDINGS: Cardiovascular: No pericardial effusion. Normal caliber of the thoracic aorta. Minimal atherosclerosis. Mediastinum/Nodes: Numerous enlarged mediastinal lymph nodes are identified. Index lymph node in the right paratracheal region measures 21 x 21 mm, previous having measured 27 x 22 mm. Increased number of enlarged lymph nodes throughout the mediastinum compared to prior neck CT. The trachea, esophagus, and thyroid are unremarkable. Lungs/Pleura: There is a small spiculated sub solid nodule in the right upper lobe image 35, measuring 5 mm. No other pulmonary nodules or masses. Mild upper lobe predominant emphysema. Hypoventilatory changes at the lung bases. No large effusion or pneumothorax. Upper Abdomen: Please refer to recent abdominal CT describing numerous hepatic metastases as well as omental caking and trace ascites. Musculoskeletal: There are no acute or destructive bony lesions. Reconstructed images demonstrate no additional findings. IMPRESSION: 1. Nonspecific sub solid 5 mm right upper lobe pulmonary nodule. Neoplasm cannot be excluded in light of findings elsewhere within the chest and abdomen. 2. Extensive mediastinal lymphadenopathy worrisome for nodal metastases. 3. Aortic Atherosclerosis (ICD10-I70.0) and Emphysema (ICD10-J43.9). 4. Please refer to recent abdominal CT describing diffuse hepatic metastases. Electronically Signed   By: Randa Ngo M.D.   On: 10/30/2019 21:56   MR BRAIN W WO CONTRAST  Result Date: 10/31/2019 CLINICAL DATA:  Weakness, metastases EXAM: MRI HEAD WITHOUT  AND WITH CONTRAST TECHNIQUE: Multiplanar, multiecho pulse sequences of the brain and surrounding structures were obtained without and with intravenous contrast. CONTRAST:  25m GADAVIST GADOBUTROL 1 MMOL/ML IV SOLN COMPARISON:  09/30/2018 FINDINGS: Motion artifact is present Brain: There is no acute  infarction or intracranial hemorrhage. There is no intracranial mass, mass effect, or edema. There is no hydrocephalus or extra-axial fluid collection. Ventricles and sulci are stable in size and configuration. Patchy T2 hyperintensity in the supratentorial and pontine white matter is nonspecific but probably reflects mild chronic microvascular ischemic changes. No abnormal enhancement. Vascular: Major vessel flow voids at the skull base are preserved. Skull and upper cervical spine: Normal marrow signal is preserved. There is susceptibility artifact related to partially imaged cervical spine fusion. Sinuses/Orbits: Paranasal sinuses are aerated. Orbits are unremarkable. Other: Sella is unremarkable.  Mastoid air cells are clear. IMPRESSION: Suboptimal evaluation due to motion artifact. No evidence of intracranial metastatic disease. No acute infarction or hemorrhage. Electronically Signed   By: PMacy MisM.D.   On: 10/31/2019 14:24   CT ABDOMEN PELVIS W CONTRAST  Result Date: 10/29/2019 CLINICAL DATA:  Hyperkalemia,, liver disease, emesis, weight loss EXAM: CT ABDOMEN AND PELVIS WITH CONTRAST TECHNIQUE: Multidetector CT imaging of the abdomen and pelvis was performed using the standard protocol following bolus administration of intravenous contrast. CONTRAST:  760mOMNIPAQUE IOHEXOL 300 MG/ML  SOLN COMPARISON:  06/17/2019, 01/05/2006 FINDINGS: Lower chest: No acute pleural or parenchymal lung disease. Hepatobiliary: There are innumerable masses replacing the majority of the liver parenchyma consistent with diffuse metastatic disease. The gallbladder is surgically absent. Pancreas: Unremarkable. No pancreatic ductal dilatation or surrounding inflammatory changes. Spleen: Normal in size without focal abnormality. Adrenals/Urinary Tract: Adrenal glands are unremarkable. Kidneys are normal, without renal calculi, focal lesion, or hydronephrosis. Bladder is unremarkable. Stomach/Bowel: No bowel obstruction or  ileus. No bowel wall thickening or inflammatory changes. Vascular/Lymphatic: There is pathologic adenopathy within the retroperitoneum. Largest pathologic lymph node in the left external iliac chain reference image 77 measures 3.5 x 3.1 cm. A second index lymph node at the right iliac bifurcation measures 2.6 x 2.2 cm reference image 62. There is minimal atherosclerosis of the aorta. Reproductive: Uterus is surgically absent. I do not see any adnexal lesions. Other: There is soft tissue fat stranding throughout the ventral omentum consistent with omental caking. Complex cystic mass within the left lower quadrant along the anterior abdominal wall measuring 5.7 x 7.2 cm is most consistent with a necrotic mass. Mural thickening along the anterior and lateral margin of the mass. There is trace free fluid within the upper abdomen surrounding the liver and spleen. No free intra-abdominal gas. Musculoskeletal: There are no acute or destructive bony lesions. Reconstructed images demonstrate no additional findings. IMPRESSION: 1. Diffuse hepatic metastases. 2. Omental caking. Complex cystic mass left lower quadrant consistent with necrotic lesion. 3. Pathologic retroperitoneal lymphadenopathy, most pronounced within the iliac distributions left greater than right. 4. Trace ascites. Electronically Signed   By: MiRanda Ngo.D.   On: 10/29/2019 22:15   [place]OPSY (LIVER)  Result Date: 10/31/2019 INDICATION: [age]ear old female with multifocal hepatic lesions concerning for metastatic disease. She presents for ultrasound-guided biopsy of the same. EXAM: ULTRASOUND BIOPSY CORE LIVER MEDICATIONS: None. ANESTHESIA/SEDATION: Moderate (conscious) sedation was employed during this procedure. A total of Versed 1 mg and Fentanyl 50 mcg was administered intravenously. Moderate Sedation Time: 13 minutes. The patient's level of consciousness and vital signs were monitored continuously by radiology nursing throughout the procedure  under my  direct supervision. FLUOROSCOPY TIME:  None. COMPLICATIONS: None immediate. PROCEDURE: Informed written consent was obtained from the patient after a thorough discussion of the procedural risks, benefits and alternatives. All questions were addressed. A timeout was performed prior to the initiation of the procedure. The liver was interrogated with ultrasound. There are innumerable hypoechoic solid lesions scattered throughout the liver. A suitable lesion in the left hepatic lobe was identified. The overlying skin was sterilely prepped and draped in the standard fashion. Local anesthesia was attained by infiltration with 1% lidocaine. A small dermatotomy was made. Under real-time ultrasound guidance, multiple 18 gauge core biopsies were obtained using the bio Pince automated biopsy device. Biopsies were performed coaxially through a 17 gauge introducer needle. As the introducer needle was withdrawn, the biopsy tract was embolized with a Gel-Foam slurry. Post ultrasound imaging demonstrates no evidence of immediate complication. IMPRESSION: Technically successful ultrasound-guided core biopsy of liver lesion. Electronically Signed   By: Jacqulynn Cadet M.D.   On: 10/31/2019 12:09   DG CHEST PORT 1 VIEW  Result Date: 11/05/2019 CLINICAL DATA:  Acute respiratory distress EXAM: PORTABLE CHEST 1 VIEW COMPARISON:  06/17/2019 FINDINGS: Single frontal view of the chest demonstrates an enlarged cardiac silhouette. Lung volumes are diminished. Mild increased interstitial prominence with central vascular congestion consistent with mild fluid overload. Small right pleural effusion. No pneumothorax. IMPRESSION: 1. Findings suggesting mild interstitial edema. Electronically Signed   By: Randa Ngo M.D.   On: 11/05/2019 22:51   DG Swallowing Func-Speech Pathology  Result Date: 11/06/2019 Objective Swallowing Evaluation: Type of Study: MBS-Modified Barium Swallow Study  Patient Details Name: ENJOLI TIDD  MRN: [id number] Date of Birth: February 14, 1946 Today's Date: 11/06/2019 Time: SLP Start Time (ACUTE ONLY): 38 -SLP Stop Time (ACUTE ONLY): 1300 SLP Time Calculation (min) (ACUTE ONLY): 30 min Past Medical History: Past Medical History: Diagnosis Date . ADD (attention deficit disorder)  . Anxiety  . Arthritis   os . Breast cancer ([place])  . Diabetes mellitus without complication ([place])  . GERD (gastroesophageal reflux disease)   barretts . Headache  . High cholesterol  . Neck pain  . Obesity  . Personal history of radiation therapy 2005 . Sleep apnea   cpap   last sleep study> 3 yrs . Swelling of both ankles  . Weakness 06/2019 Past Surgical History: Past Surgical History: Procedure Laterality Date . ABDOMINAL HYSTERECTOMY   . ANTERIOR CERVICAL DECOMP/DISCECTOMY FUSION N/A 02/15/2016  Procedure: Cervical three four-Cervical five-six, Cervical six-seven Anterior cervical decompression/diskectomy/fusion;  Surgeon: Kristeen Miss, MD;  Location: [facility];  Service: Neurosurgery;  Laterality: N/A; . bicept surgery   . BREAST LUMPECTOMY   . CHOLECYSTECTOMY   . JOINT REPLACEMENT   . PAROTID GLAND TUMOR EXCISION   . TONSILLECTOMY   . TOTAL KNEE ARTHROPLASTY  07/19/2012  Procedure: TOTAL KNEE ARTHROPLASTY;  Surgeon: Yvette Rack., MD;  Location: [place];  Service: Orthopedics;  Laterality: Right;  right total knee arthroplasty . TUBAL LIGATION   HPI: Pt is a 74 year old female admitted with medical decline and abnormal labs, found to have signs of malignant breast cancer with hepatic metastases. Admitted with subacute abdominal pain waxing waning over 3 months more acutely 3 weeks ago developed worsening weakness poor appetite nausea.  Has had difficulty swallowing with some question of a neck mass and thrust. CT shows Extensive mediastinal lymphadenopathy worrisome for nodal metastases.  No data recorded Assessment / Plan / Recommendation CHL IP CLINICAL IMPRESSIONS 11/06/2019 Clinical Impression  Pt demonstrates  mild oropharyngeal  dysphagia, appearing primarily due to decreased awareness and mentation. Pt kept her eyes closed during session but followed commands as needed, When taking sips, there were instances of brief oral holding and also slight premature spillage. Airway closure was not complete at times during the swallow leading to instances of flash frank penetration. Typically no cough was elicited during these events, but as study progressed pt coughed more and mroe often, not appearing to be related to pharyngeal impairment. Esophageal sweep showed some distal residue and pill needed several bolus to fully clear. Pt is capable of regular solids and thin liquids but she should only eat and drink when fully alert. Will f/u for tolerance of diet and further modifications as needed.  SLP Visit Diagnosis Dysphagia, unspecified (R13.10) Attention and concentration deficit following -- Frontal lobe and executive function deficit following -- Impact on safety and function Mild aspiration risk;Risk for inadequate nutrition/hydration   CHL IP TREATMENT RECOMMENDATION 11/06/2019 Treatment Recommendations Therapy as outlined in treatment plan below   No flowsheet data found. CHL IP DIET RECOMMENDATION 11/06/2019 SLP Diet Recommendations -- Liquid Administration via Cup;Straw Medication Administration Whole meds with liquid Compensations -- Postural Changes Seated upright at 90 degrees;Remain semi-upright after after feeds/meals (Comment)   No flowsheet data found.  CHL IP FOLLOW UP RECOMMENDATIONS 11/06/2019 Follow up Recommendations 24 hour supervision/assistance   CHL IP FREQUENCY AND DURATION 11/06/2019 Speech Therapy Frequency (ACUTE ONLY) min 1 x/week Treatment Duration 1 week      CHL IP ORAL PHASE 11/06/2019 Oral Phase Impaired Oral - Pudding Teaspoon -- Oral - Pudding Cup -- Oral - Honey Teaspoon -- Oral - Honey Cup -- Oral - Nectar Teaspoon -- Oral - Nectar Cup -- Oral - Nectar Straw -- Oral - Thin Teaspoon Premature spillage;Decreased  bolus cohesion Oral - Thin Cup -- Oral - Thin Straw Delayed oral transit Oral - Puree Delayed oral transit Oral - Mech Soft -- Oral - Regular Delayed oral transit Oral - Multi-Consistency -- Oral - Pill -- Oral Phase - Comment --  CHL IP PHARYNGEAL PHASE 11/06/2019 Pharyngeal Phase Impaired Pharyngeal- Pudding Teaspoon -- Pharyngeal -- Pharyngeal- Pudding Cup -- Pharyngeal -- Pharyngeal- Honey Teaspoon -- Pharyngeal -- Pharyngeal- Honey Cup -- Pharyngeal -- Pharyngeal- Nectar Teaspoon -- Pharyngeal -- Pharyngeal- Nectar Cup -- Pharyngeal -- Pharyngeal- Nectar Straw -- Pharyngeal -- Pharyngeal- Thin Teaspoon Penetration/Aspiration before swallow;Penetration/Aspiration during swallow Pharyngeal Material enters airway, remains ABOVE vocal cords then ejected out Pharyngeal- Thin Cup -- Pharyngeal -- Pharyngeal- Thin Straw Penetration/Aspiration before swallow;Penetration/Aspiration during swallow Pharyngeal Material enters airway, CONTACTS cords and then ejected out;Material enters airway, remains ABOVE vocal cords then ejected out;Material does not enter airway Pharyngeal- Puree WFL Pharyngeal -- Pharyngeal- Mechanical Soft -- Pharyngeal -- Pharyngeal- Regular WFL Pharyngeal -- Pharyngeal- Multi-consistency -- Pharyngeal -- Pharyngeal- Pill WFL Pharyngeal -- Pharyngeal Comment --  No flowsheet data found. DeBlois, Katherene Ponto 11/06/2019, 2:48 PM              VAS [place] UPPER EXTREMITY VENOUS DUPLEX  Result Date: 11/07/2019 UPPER VENOUS STUDY  Indications: Edema Limitations: Body habitus, poor ultrasound/tissue interface and patient unable to cooperate. Patient position. Comparison Study: No prior study Performing Technologist: Maudry Mayhew MHA, RDMS, RVT, RDCS  Examination Guidelines: A complete evaluation includes B-mode imaging, spectral Doppler, color Doppler, and power Doppler as needed of all accessible portions of each vessel. Bilateral testing is considered an integral part of a complete examination.  Limited examinations for reoccurring indications may be performed as noted.  Right Findings: +----------+------------+---------+-----------+----------+--------------+ RIGHT     CompressiblePhasicitySpontaneousProperties   Summary     +----------+------------+---------+-----------+----------+--------------+ Subclavian                                          Not visualized +----------+------------+---------+-----------+----------+--------------+  Left Findings: +----------+------------+---------+-----------+----------+--------------+ LEFT      CompressiblePhasicitySpontaneousProperties   Summary     +----------+------------+---------+-----------+----------+--------------+ IJV           Full       Yes       Yes                             +----------+------------+---------+-----------+----------+--------------+ Subclavian    Full       Yes       Yes                             +----------+------------+---------+-----------+----------+--------------+ Axillary      Full       Yes       Yes                             +----------+------------+---------+-----------+----------+--------------+ Brachial      Full       Yes       Yes                             +----------+------------+---------+-----------+----------+--------------+ Radial        Full                                                 +----------+------------+---------+-----------+----------+--------------+ Ulnar                                               Not visualized +----------+------------+---------+-----------+----------+--------------+ Cephalic      Full                                                 +----------+------------+---------+-----------+----------+--------------+ Basilic                                             Not visualized +----------+------------+---------+-----------+----------+--------------+  Summary:  Left: No evidence of deep vein thrombosis in the  upper extremity. No evidence of superficial vein thrombosis in the upper extremity. This study was limited due to technical limitations listed above.  *See table(s) above for measurements and observations.  Diagnosing physician: Servando Snare MD Electronically signed by Servando Snare MD on 11/07/2019 at 3:03:30 PM.    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 EXAM: ULTRASOUND AND FLUOROSCOPIC GUIDED  PICC LINE INSERTION MEDICATIONS: 1% lidocaine to skin and subcutaneous tissue ANESTHESIA/SEDATION: None FLUOROSCOPY TIME:  Fluoroscopy Time:  42 seconds (7 mGy). COMPLICATIONS: None immediate. TECHNIQUE: The procedure, risks, benefits, and alternatives were explained to the patient and informed written consent was obtained. A timeout was performed prior to the initiation of the procedure. The right upper extremity was prepped with chlorhexidine in a sterile fashion, and a sterile drape was applied covering the operative field. Maximum barrier sterile technique with sterile gowns and gloves were used for the procedure. A timeout was performed prior to the initiation of the procedure. Local anesthesia was provided with 1% lidocaine. Under direct ultrasound guidance, the right brachial vein was accessed with a micropuncture kit after the overlying soft tissues were anesthetized with 1% lidocaine. An ultrasound image was saved for documentation purposes. A guidewire was advanced to the level of the superior caval-atrial junction for measurement purposes and the PICC line was cut to length. A peel-away sheath was placed and a 39 cm, 5 [place], dual lumen was inserted to level of the superior caval-atrial junction. A post procedure spot fluoroscopic was obtained. The catheter easily aspirated and flushed and was sutured in place. A dressing was  placed. The patient tolerated the procedure well without immediate post procedural complication. FINDINGS: After catheter placement, the tip lies within the superior cavoatrial junction the catheter aspirates and flushes normally and is ready for immediate use. IMPRESSION: Successful ultrasound and fluoroscopic guided placement of a right brachial vein approach, 39 cm, 5 [place], dual lumen PICC with tip at the superior caval-atrial junction. The PICC line is ready for immediate use. Read by: Rowe Robert, PA-C Electronically Signed   By: Aletta Edouard M.D.   On: 11/10/2019 13:39   [place] EKG SITE RITE  Result Date: 11/07/2019 If Site Rite image not attached, placement could not be confirmed due to current cardiac rhythm.   ASSESSMENT AND PLAN: 1.  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  Primary peritoneal carcinoma favored given history of hysterectomy with negative pathology.  -10/29/2019 CEA 4.8, CA 19.9 441, AFP 2.1   -10/30/2019 CA 27.29 6728  -11/06/2019 CA-125 14,072 2.  Elevated total bilirubin and LFTs 3.  AKI secondary to dehydration 4.  Hypercalcemia-status post Zometa and calcitonin, improved 5.  Deconditioning 6.  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  Diabetes mellitus 8.  Temporal arteritis, status post treatment 9.  Migraine headaches 10.  Leg weakness-steroid myopathy?,  Paraneoplastic syndrome?  [name] appears stable.  PICC line was placed yesterday in preparation for chemotherapy.  We will plan to proceed with dose reduced carboplatin and Taxol today.  Adverse effects have previously been discussed with the patient and her family and she agrees to proceed.  Having frequent loose stools likely due to lactulose.  We will plan to cut this back.  She has not been getting out of bed and will  plan to have PT/OT evaluate her.  Recommendations: 1.  Proceed with chemotherapy today as scheduled. 2.  PT/OT evaluation requested. 3.  Decrease lactulose to once a day. 4.  Recommend diuresis once chemotherapy complete.   LOS: 12 days   Mikey Bussing 11/11/19 [name] was interviewed and examined.  She is alert and oriented this morning.  She would like to proceed with chemotherapy today.  She received Decadron prophylaxis last night.  I recommend an attempt at diuresis  and increased ambulation after the chemotherapy is completed.

## 2019-11-11 NOTE — Progress Notes (Signed)
Reviewed chemotherapy doses with Dr. Benay Spice on 11/10/19. Paclitaxel dose decreased for elevated t bili and liver enzymes. Carboplatin dose reduced to AUC=2 due to CrCL likely not reflecting true renal clearance (excess fluid/edema causing increased weight). Plan is likely for Q3 week chemotherapy depending on tolerability.   Demetrius Charity, PharmD, BCPS, [place] [facility] [facility] Pharmacist Pharmacy Phone: [phone] 11/11/2019

## 2019-11-12 DIAGNOSIS — R7401 Elevation of levels of liver transaminase levels: Secondary | ICD-10-CM

## 2019-11-12 DIAGNOSIS — R601 Generalized edema: Secondary | ICD-10-CM

## 2019-11-12 LAB — COMPREHENSIVE METABOLIC PANEL
ALT: 63 U/L — ABNORMAL HIGH (ref 0–44)
AST: 212 U/L — ABNORMAL HIGH (ref 15–41)
Albumin: 1.9 g/dL — ABNORMAL LOW (ref 3.5–5.0)
Alkaline Phosphatase: 1440 U/L — ABNORMAL HIGH (ref 38–126)
Anion gap: 9 (ref 5–15)
BUN: 28 mg/dL — ABNORMAL HIGH (ref 8–23)
CO2: 21 mmol/L — ABNORMAL LOW (ref 22–32)
Calcium: 8.2 mg/dL — ABNORMAL LOW (ref 8.9–10.3)
Chloride: 107 mmol/L (ref 98–111)
Creatinine, Ser: 0.56 mg/dL (ref 0.44–1.00)
GFR calc Af Amer: >60 mL/min (ref >60)
GFR calc non Af Amer: >60 mL/min (ref >60)
Glucose, Bld: 117 mg/dL — ABNORMAL HIGH (ref 70–99)
Potassium: 4.5 mmol/L (ref 3.5–5.1)
Sodium: 137 mmol/L (ref 135–145)
Total Bilirubin: 2.3 mg/dL — ABNORMAL HIGH (ref 0.3–1.2)
Total Protein: 5.1 g/dL — ABNORMAL LOW (ref 6.5–8.1)

## 2019-11-12 LAB — CBC WITH DIFFERENTIAL/PLATELET
Abs Immature Granulocytes: 1.31 10*3/uL — ABNORMAL HIGH (ref 0.00–0.07)
Basophils Absolute: 0.1 10*3/uL (ref 0.0–0.1)
Basophils Relative: 0 %
Eosinophils Absolute: 0 10*3/uL (ref 0.0–0.5)
Eosinophils Relative: 0 %
HCT: 33.2 % — ABNORMAL LOW (ref 36.0–46.0)
Hemoglobin: 9.7 g/dL — ABNORMAL LOW (ref 12.0–15.0)
Immature Granulocytes: 8 %
Lymphocytes Relative: 4 %
Lymphs Abs: 0.7 10*3/uL (ref 0.7–4.0)
MCH: 30.4 pg (ref 26.0–34.0)
MCHC: 29.2 g/dL — ABNORMAL LOW (ref 30.0–36.0)
MCV: 104.1 fL — ABNORMAL HIGH (ref 80.0–100.0)
Monocytes Absolute: 1.1 10*3/uL — ABNORMAL HIGH (ref 0.1–1.0)
Monocytes Relative: 6 %
Neutro Abs: 14.4 10*3/uL — ABNORMAL HIGH (ref 1.7–7.7)
Neutrophils Relative %: 82 %
Platelets: 280 10*3/uL (ref 150–400)
RBC: 3.19 MIL/uL — ABNORMAL LOW (ref 3.87–5.11)
RDW: 23.1 % — ABNORMAL HIGH (ref 11.5–15.5)
WBC: 17.5 10*3/uL — ABNORMAL HIGH (ref 4.0–10.5)
nRBC: 0.6 % — ABNORMAL HIGH (ref 0.0–0.2)

## 2019-11-12 LAB — AMMONIA: Ammonia: 47 umol/L — ABNORMAL HIGH (ref 9–35)

## 2019-11-12 LAB — PHOSPHORUS: Phosphorus: 2.1 mg/dL — ABNORMAL LOW (ref 2.5–4.6)

## 2019-11-12 LAB — MAGNESIUM: Magnesium: 2.1 mg/dL (ref 1.7–2.4)

## 2019-11-12 MED ORDER — K PHOS MONO-SOD PHOS DI & MONO 155-852-130 MG PO TABS
500.0000 mg | ORAL_TABLET | Freq: Once | ORAL | Status: AC
  Administered 2019-11-12: 500 mg via ORAL
  Filled 2019-11-12: qty 2

## 2019-11-12 MED ORDER — FUROSEMIDE 10 MG/ML IJ SOLN
60.0000 mg | Freq: Once | INTRAMUSCULAR | Status: AC
  Administered 2019-11-12: 60 mg via INTRAVENOUS
  Filled 2019-11-12: qty 6

## 2019-11-12 NOTE — Plan of Care (Signed)
  Problem: Health Behavior/Discharge Planning: Goal: Ability to manage health-related needs will improve Outcome: Progressing   

## 2019-11-12 NOTE — Progress Notes (Signed)
PROGRESS NOTE    [name]    Code Status: DNR  UN:[id number] DOB: 08-18-1945 DOA: 10/29/2019 LOS: 13 days  PCP: Leonides Sake, MD CC:  Chief Complaint  Patient presents with  . Abdominal Pain  . Abnormal Lab       Hospital Summary   This is a 74 year old female with history of type 2 diabetes, temporal arteritis, migraine, OSA and cervical radiculopathy who was admitted with waxing waning abdominal pain x3 months which had become worse 3 weeks prior to admission with generalized weakness and poor appetite as well as nausea who presented to her PCP and sent to the ED due to concerns of underlying malignancy.  In the ED she was found to have hepatic metastases, omental caking, complex cystic left lower quadrant mass and retroperitoneal adenopathy as well as hyponatremia, hypocalcemia and AKI.  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  Oncology and palliative care have been consulted.  She started on chemotherapy this hospitalization.    A & P   Principal Problem:   Metastatic neoplastic disease ([place]) Active Problems:   Uncontrolled type 2 diabetes mellitus with hyperglycemia, with long-term current use of insulin (HCC)   Hypercalcemia   AKI (acute kidney injury) ([place])   Dehydration with hyponatremia   GERD without esophagitis   Metastatic malignant neoplasm [facility])   Palliative care by specialist   Goals of care, counseling/discussion   DNR (do not resuscitate)   Hypercalcemia of malignancy   Hepatic encephalopathy ([place])   Palliative care encounter   Pressure injury of skin   Hepatic dysfunction   1. 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 Had liver biopsy on 5/14 b. PICC line placement on 5/24 c. Started chemotherapy with carboplatin and Taxol on  5/25 d. Appreciate oncology and palliative care recommendations  2. Acute encephalopathy, likely multifactorial: Metabolic and hepatic a. Had elevated ammonia level which improved initially with lactulose however she had multiple BMs and this was decreased b. Currently awake and alert but oriented x0 c. Repeat ammonia 47 d. Continue lactulose and titrate for 2-3 loose BMs daily  3. Transaminitis a. Possibly from diffuse hepatic mets b. Continue to monitor  4. Anasarca a. Home Lasix was on hold due to oncology recommendations, okay to restart today b. Will start on IV Lasix today, if no improvement will add albumin tomorrow with diuresis  5. NAGMA, improved a. Continue to monitor  6. Hypercalcemia, improved a. Received Zometa x1  7. Hypophosphatemia a. Replenish p.o. b. Nutrition consult  8. Leukocytosis, downtrending a. Possibly reactive to underlying malignancy and illness b. No obvious signs of infection c. Continue to monitor for now   DVT prophylaxis: Heparin Family Communication: No family at bedside Disposition Plan:  Status is: Inpatient  Remains inpatient appropriate because:Altered mental status, Ongoing diagnostic testing needed not appropriate for outpatient work up and IV treatments appropriate due to intensity of illness or inability to take PO   Dispo: The patient is from: Home              Anticipated d/c is to: TBD              Anticipated d/c date is: 3 days              Patient currently is not medically stable to d/c.  Pressure injury documentation   Pressure Injury 11/06/19 Sacrum Stage 2 -  Partial thickness loss of dermis presenting as a shallow open injury with a red, pink wound bed without slough. (Active)  11/06/19 1500  Location: Sacrum  Location Orientation:   Staging: Stage 2 -  Partial thickness loss of dermis presenting as a shallow open injury with a red, pink wound bed without slough.  Wound Description (Comments):    Present on Admission:    None  Consultants  Oncology Palliative care   Procedures  5/24 PICC line placement Started chemotherapy 5/25   Antibiotics   Anti-infectives (From admission, onward)   Start     Dose/Rate Route Frequency Ordered Stop   11/01/19 1615  fluconazole (DIFLUCAN) tablet 150 mg     150 mg Oral  Once 11/01/19 1609 11/01/19 1713        Subjective   Patient is a poor historian given her underlying altered mental status.  She currently denies any acute complaints no overnight events.  Objective   Vitals:   11/11/19 2044 11/11/19 2046 11/12/19 0538 11/12/19 1410  BP: 131/86  122/79 127/86  Pulse: 100 93 87 (!) 102  Resp: 20  20 15   Temp: 97.9 F (36.6 C)  (!) 97.5 F (36.4 C)   TempSrc: Oral  Oral   SpO2: 96% 98% 97% 97%  Weight:      Height:        Intake/Output Summary (Last 24 hours) at 11/12/2019 1610 Last data filed at 11/12/2019 1400 Gross per 24 hour  Intake 1260 ml  Output 1075 ml  Net 185 ml   Filed Weights   11/07/19 2041 11/08/19 2139 11/09/19 1609  Weight: 120.1 kg 120.1 kg 123.4 kg    Examination:  Physical Exam Vitals and nursing note reviewed.  Constitutional:      Appearance: Normal appearance. She is obese.     Comments: Chronically ill-appearing  Eyes:     Conjunctiva/sclera: Conjunctivae normal.  Cardiovascular:     Rate and Rhythm: Normal rate and regular rhythm.  Pulmonary:     Effort: Pulmonary effort is normal.     Breath sounds: Normal breath sounds. No rales.  Abdominal:     General: There is no distension.     Palpations: There is mass.  Musculoskeletal:        General: No swelling or tenderness.     Comments: Diffuse pitting edema  Skin:    Coloration: Skin is jaundiced. Skin is not cyanotic.  Neurological:     Mental Status: Mental status is at baseline. She is disoriented.     Comments: Awake and responsive but oriented x0     Data Reviewed: I have personally reviewed following labs and  imaging studies  CBC: Recent Labs  Lab 11/06/19 0449 11/06/19 0449 11/07/19 0801 11/08/19 1008 11/10/19 0443 11/11/19 0543 11/12/19 0336  WBC 17.8*   < > 19.2* 24.6* 19.5* 20.4* 17.5*  NEUTROABS 13.8*  --  14.4* 20.0*  --  17.0* 14.4*  HGB 10.9*   < > 11.5* 11.4* 9.4* 9.3* 9.7*  HCT 35.2*   < > 37.4 37.0 30.8* 30.4* 33.2*  MCV 98.3   < > 99.7 100.3* 100.7* 100.3* 104.1*  PLT 319   < > 300 PLATELET CLUMPS NOTED ON SMEAR, UNABLE TO ESTIMATE 227 264 280   < > = values in this interval not displayed.   Basic Metabolic Panel: Recent Labs  Lab 11/07/19 0457 11/08/19 1008 11/10/19 0443 11/11/19 0543  11/12/19 0336  NA 143 139 136 137 137  K 3.9 3.8 4.4 4.1 4.5  CL 110 108 107 106 107  CO2 19* 18* 20* 20* 21*  GLUCOSE 81 133* 103* 125* 117*  BUN 20 19 24* 26* 28*  CREATININE 0.75 0.73 0.60 0.64 0.56  CALCIUM 8.8* 8.7* 7.9* 8.3* 8.2*  MG 1.8 1.8 1.9 1.9 2.1  PHOS  --  1.5* 2.6 2.1* 2.1*   GFR: Estimated Creatinine Clearance: 82.5 mL/min (by C-G formula based on SCr of 0.56 mg/dL). Liver Function Tests: Recent Labs  Lab 11/07/19 0457 11/07/19 0457 11/07/19 1540 11/08/19 1008 11/10/19 0443 11/11/19 0543 11/12/19 0336  AST 470*  --   --  313* 199* 178* 212*  ALT 96*  --   --  87* 62* 57* 63*  ALKPHOS 1,347*  --   --  1,282* 1,158* 1,261* 1,440*  BILITOT 3.0*   < > 2.9* 3.0* 2.7* 2.1* 2.3*  PROT 5.1*  --   --  5.1* 4.7* 5.0* 5.1*  ALBUMIN 1.6*  --   --  1.5* 1.6* 2.0* 1.9*   < > = values in this interval not displayed.   No results for input(s): LIPASE, AMYLASE in the last 168 hours. Recent Labs  Lab 11/06/19 0449 11/08/19 1142 11/10/19 0443 11/12/19 1130  AMMONIA 58* 67* 54* 47*   Coagulation Profile: Recent Labs  Lab 11/06/19 0449 11/10/19 0443  INR 1.3* 1.2   Cardiac Enzymes: No results for input(s): CKTOTAL, CKMB, CKMBINDEX, TROPONINI in the last 168 hours. BNP (last 3 results) No results for input(s): PROBNP in the last 8760 hours. HbA1C: No  results for input(s): HGBA1C in the last 72 hours. CBG: Recent Labs  Lab 11/07/19 0410 11/07/19 0526 11/07/19 0821 11/07/19 1159 11/08/19 1113  GLUCAP 78 95 89 95 122*   Lipid Profile: No results for input(s): CHOL, HDL, LDLCALC, TRIG, CHOLHDL, LDLDIRECT in the last 72 hours. Thyroid Function Tests: No results for input(s): TSH, T4TOTAL, FREET4, T3FREE, THYROIDAB in the last 72 hours. Anemia Panel: No results for input(s): VITAMINB12, FOLATE, FERRITIN, TIBC, IRON, RETICCTPCT in the last 72 hours. Sepsis Labs: No results for input(s): PROCALCITON, LATICACIDVEN in the last 168 hours.  No results found for this or any previous visit (from the past 240 hour(s)).       Radiology Studies: No results found.      Scheduled Meds: . Chlorhexidine Gluconate Cloth  6 each Topical Daily  . citalopram  10 mg Oral Daily  . feeding supplement (ENSURE ENLIVE)  237 mL Oral TID BM  . fluticasone  1 spray Each Nare BID  . lactulose  30 g Oral Daily  . nystatin  5 mL Mouth/Throat QID  . nystatin   Topical TID  . pantoprazole  40 mg Oral Q supper   Continuous Infusions: . sodium chloride    . famotidine (PEPCID) IV (ONCOLOGY)       Time spent: 40 minutes with over 50% of the time coordinating the patient's care    Harold Hedge, DO [facility] Pager [phone]  Call night coverage person covering after 7pm

## 2019-11-12 NOTE — Progress Notes (Signed)
Daily Progress Note   Patient Name: [name]       Date: 11/12/2019 DOB: 1945-08-04  Age: 74 y.o. MRN#: [id number] Attending Physician: Patricia Hedge, MD Primary Care Physician: Patricia Sake, MD Admit Date: 10/29/2019  Reason for Consultation/Follow-up: Establishing goals of care and Psychosocial/spiritual support   Subjective: Patient lying in bed resting comfortably. She is awake, alert, and able to participate in conversation. She states she is eating and drinking well - is also ready for a nap. No acute concerns.  Length of Stay: 13  Current Medications: Scheduled Meds:   Chlorhexidine Gluconate Cloth  6 each Topical Daily   citalopram  10 mg Oral Daily   feeding supplement (ENSURE ENLIVE)  237 mL Oral TID BM   fluticasone  1 spray Each Nare BID   lactulose  30 g Oral Daily   nystatin  5 mL Mouth/Throat QID   nystatin   Topical TID   pantoprazole  40 mg Oral Q supper    Continuous Infusions:  sodium chloride     famotidine (PEPCID) IV (ONCOLOGY)      PRN Meds: sodium chloride, acetaminophen **OR** acetaminophen, albuterol, alteplase, diphenhydrAMINE, EPINEPHrine, famotidine (PEPCID) IV (ONCOLOGY), heparin lock flush, heparin lock flush, methylPREDNISolone sodium succinate, ondansetron **OR** ondansetron (ZOFRAN) IV, sodium chloride flush, sodium chloride flush  Physical Exam Constitutional:      General: She is not in acute distress. Pulmonary:     Effort: Pulmonary effort is normal. No respiratory distress.  Skin:    General: Skin is warm.  Neurological:     Mental Status: She is alert.             Vital Signs: BP 122/79 (BP Location: Left Arm)    Pulse 87    Temp (!) 97.5 F (36.4 C) (Oral)    Resp 20    Ht 5\' 4"  (1.626 m)    Wt 123.4 kg    SpO2 97%     BMI 46.69 kg/m  SpO2: SpO2: 97 % O2 Device: O2 Device: Room Air O2 Flow Rate:    Intake/output summary:   Intake/Output Summary (Last 24 hours) at 11/12/2019 1020 Last data filed at 11/12/2019 0550 Gross per 24 hour  Intake --  Output 1075 ml  Net -1075 ml   LBM: Last BM Date: 11/11/19 Baseline Weight: Weight: 101.2 kg Most recent weight: Weight: 123.4 kg       Palliative Assessment/Data: PPS 30% due to ambulation    Flowsheet Rows     Most Recent Value  Intake Tab  Referral Department  Hospitalist  Unit at Time of Referral  Cardiac/Telemetry Unit  Palliative Care Primary Diagnosis  Cancer  Date Notified  11/01/19  Palliative Care Type  New Palliative care  Reason for referral  Clarify Goals of Care  Date of Admission  10/29/19  Date first seen by Palliative Care  11/02/19  # of days Palliative referral response time  1 Day(s)  # of days IP prior to Palliative referral  3  Clinical Assessment  Palliative Performance Scale Score  20%  Psychosocial & Spiritual Assessment  Palliative Care Outcomes  Patient/Family meeting held?  Yes  Who was at the meeting?  spouse  Palliative Care Outcomes  Clarified goals of care, Counseled regarding hospice, Provided psychosocial or spiritual support, Changed CPR status      Patient Active Problem List   Diagnosis Date Noted   Hepatic dysfunction    Pressure injury of skin 11/06/2019   Hypercalcemia of malignancy    Hepatic encephalopathy (HCC)    Palliative care encounter    Palliative care by specialist    Goals of care, counseling/discussion    DNR (do not resuscitate)    Metastatic malignant neoplasm ([place]) 10/30/2019   Hypercalcemia 10/29/2019   AKI (acute kidney injury) ([place]) 10/29/2019   Metastatic neoplastic disease ([place]) 10/29/2019   Dehydration with hyponatremia 10/29/2019   GERD without esophagitis 10/29/2019   Polypharmacy    Uncontrolled type 2 diabetes mellitus with hyperglycemia, with  long-term current use of insulin (HCC)    Weakness 06/19/2019   Cushingoid side effect of steroids (HCC)    Generalized weakness 06/17/2019   Cellulitis of leg, left    Elevated LFTs    Mass in neck    Cervical radiculopathy at C8 01/31/2019   Right arm numbness 01/30/2019   Chronic migraine 12/06/2017   Chronic intractable headache 10/23/2017   Cervical myelopathy with cervical radiculopathy 02/15/2016    Palliative Care Assessment & Plan   HPI: Per intake H&P -->Patient is 74 year old female with history of type 2 diabetes on insulin, temporal arteritis recently treated with very high dose of steroids, GERD, migraine headaches, obstructive sleep apnea who presents to the emergency room with abnormal lab from her primary care physician's office. Recently getting extremely debilitated and unable to walk since last 2 weeks. In the emergency room, hemodynamically stable. Clinically dehydrated. CT scan abdomen pelvis shows extensive hepatic metastatic disease, omental caking and complex cyst left lower quadrant, multiple lymphadenopathy.  Palliative care was asked to get involved in the setting of likely metastatic cancer.  Assessment: Went to visit patient at bedside. No other family present.  [name] was awake and alert - stating she felt and was doing much better today. She stated she is eating and drinking well with no complaints of nausea or vomiting. She was able to verbalize her chemotherapy treatments started yesterday and that she was able to tolerate it well.   Called and spoke with husband/[name]. He states that [name] is "feeling better, doing better, and has her color back." He verbalized that the plan was for her to possibly discharge later this week to SNF with rehab. Discussed that the facility would be able to transfer [name] to/from her chemotherapy treatments. Educated on importance of working with therapy to increase strength if the plan is for her  to return home in the future - he was in understanding. However, also discussed with [name] the importance of continued conversation with family and the medical providers regarding overall plan of care and treatment options, ensuring decisions are within the context of the patients values and GOCs.    Liver function labs were reviewed and discussed with [name] per his request.  [name] is open to taking decisions one step at a time and re-evaluating goals if needed as Ms. Belt's medical treatments progress.   All questions and concerns were addressed. Encouraged to call with any other questions or concerns if they arise.    Recommendations/Plan:  Continue current medical treatment plan  Continue DNR  Will continue to follow-up with patient and family as needed as chemotherapy treatments continue  Recommend outpatient Palliative Care to follow  when discharged  Goals of Care and Additional Recommendations:  Limitations on Scope of Treatment: Full Scope Treatment  Code Status:  DNR  Prognosis:   Unable to determine  Discharge Planning:  [place] for rehab with Palliative care service follow-up  Care plan was discussed with patient's spouse  Thank you for allowing the Palliative Medicine Team to assist in the care of this patient.   Total Time 25 minutes Prolonged Time Billed  no       Greater than 50%  of this time was spent counseling and coordinating care related to the above assessment and plan.  Amber M. Smith FNP-BC  [place], DNP, [facility] Palliative Medicine Team Team Phone # [phone]  Pager [phone]

## 2019-11-12 NOTE — Progress Notes (Signed)
  Speech Language Pathology Treatment: Dysphagia  Patient Details Name: [name] MRN: [id number] DOB: 10-Jul-1945 Today's Date: 11/12/2019 Time: 0518-3358 SLP Time Calculation (min) (ACUTE ONLY): 15 min  Assessment / Plan / Recommendation Clinical Impression  Pt seen at bedside for assessment of diet tolerance following MBS 11/06/19. Rn reports pt has been tolerating regular solids and thin liquids without overt difficulty, but has difficulty due to double vision. Pt accepted trials of water and Ensure. No overt s/s aspiration observed or reported. Recommend continuing with regular solids and thin liquids. No further ST intervention recommended at this time. Please reconsult if needs arise.    HPI HPI: Pt is a 74 year old female admitted with medical decline and abnormal labs, found to have signs of malignant breast cancer with hepatic metastases. Admitted with subacute abdominal pain waxing waning over 3 months more acutely 3 weeks ago developed worsening weakness poor appetite nausea.  Has had difficulty swallowing with some question of a neck mass and thrush. CT shows Extensive mediastinal lymphadenopathy worrisome for nodal metastases.      SLP Plan  Discharge SLP treatment due to all goals met      Recommendations  Diet recommendations: Regular;Thin liquid Liquids provided via: Cup;Straw Supervision: Staff to assist with self feeding Compensations: Small sips/bites;Slow rate Postural Changes and/or Swallow Maneuvers: Seated upright 90 degrees                Oral Care Recommendations: Oral care BID Follow up Recommendations: 24 hour supervision/assistance SLP Visit Diagnosis: Dysphagia, unspecified (R13.10) Plan: All goals met;Discharge SLP treatment due to (comment)       GO              [name] B. Quentin Ore, [facility], [place] Speech Language Pathologist Office: [phone]  Shonna Chock 11/12/2019, 11:11 AM

## 2019-11-12 NOTE — Progress Notes (Signed)
Occupational Therapy Treatment Patient Details Name: [name] MRN: [id number] DOB: 09/30/1945 Today's Date: 11/12/2019    History of present illness Patient is 74 y.o. female with PMH significant for DM, temporal arteritis, breast cancer, OSA on CPAP, Rt TKA, and ACDF. Pt presented to ED at [facility] on 10/29/19 and was admitted with waxing and waning abdominal pain for 3 months that has gotten worse over the last 3 weeks, generalized weakness, poor appetite and nausea. In ED, pt found to have hepatic metastasis, omental caking, complex cystic LLQ mass retroperitoneal adenopathy. Pt underwent liver biopsy by IR on 5/14. 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 Pt transferred to [facility] on 5/23. IR was placed PICC line on 5/24 and pt initiated chemotherapy on 5/25.    OT comments  Treatment focused on ROM/strengthening of bilateral upper extremities and improving functional mobility in preparation for self care tasks. Patient limited by weakness, poor activity tolerance and edema in extremities. CONT POC.   Follow Up Recommendations  SNF    Equipment Recommendations  Other (comment)    Recommendations for Other Services      Precautions / Restrictions Precautions Precautions: Fall Restrictions Weight Bearing Restrictions: No       Mobility Bed Mobility Overal bed mobility: Needs Assistance       Supine to sit: Total assist;+2 for physical assistance;+2 for safety/equipment;HOB elevated Sit to supine: Total assist;+2 for physical assistance;+2 for safety/equipment   General bed mobility comments: Patient transferred to side of bed with assistance of two to work on improving sitting/activity tolerance. Patient able to assist with using bed rail to pull herself towards the right side of the bed but needed assistance for both LEs and trunk lift off. Patient maintained seated position at side of bed predominantly leaned over onto  her right elbow/hand. Patient able to pump ankles and extend knees in seated position to work on muscle activation and edema reduction. Patient tolerance approx 7 minutes sitting EOB.  Transfers                      Balance                                           ADL either performed or assessed with clinical judgement   ADL                                               Vision       Perception     Praxis      Cognition Arousal/Alertness: Awake/alert Behavior During Therapy: WFL for tasks assessed/performed Overall Cognitive Status: Impaired/Different from baseline Area of Impairment: Attention;Memory;Following commands;Awareness;Problem solving                   Current Attention Level: Focused   Following Commands: Follows one step commands consistently   Awareness: Emergent Problem Solving: Decreased initiation          Exercises General Exercises - Upper Extremity Shoulder Flexion: AAROM;10 reps;Left;Right Elbow Flexion: AROM;Left;Right;10 reps Elbow Extension: AROM;10 reps;Right;Left Wrist Extension: AAROM;Left;Right;10 reps Digit Composite Flexion: Squeeze ball;10 reps;Left;Right   Shoulder Instructions       General Comments      Pertinent  Vitals/ Pain       Pain Assessment: No/denies pain  Home Living                                          Prior Functioning/Environment              Frequency  Min 2X/week        Progress Toward Goals  OT Goals(current goals can now be found in the care plan section)        Plan Discharge plan remains appropriate    Co-evaluation                 AM-PAC OT "6 Clicks" Daily Activity     Outcome Measure                    End of Session    OT Visit Diagnosis: Unsteadiness on feet (R26.81);Muscle weakness (generalized) (M62.81);Other symptoms and signs involving cognitive function   Activity Tolerance  Patient limited by fatigue   Patient Left in bed;with call bell/phone within reach;with bed alarm set   Nurse Communication Mobility status        Time: 1335-1403 OT Time Calculation (min): 28 min  Charges: OT General Charges $OT Visit: 1 Visit OT Treatments $Therapeutic Activity: 8-22 mins $Therapeutic Exercise: 8-22 mins  Derl Barrow, OTR/L [place]  Office [phone]    Lenward Chancellor 11/12/2019, 6:02 PM

## 2019-11-12 NOTE — Progress Notes (Addendum)
HEMATOLOGY-ONCOLOGY PROGRESS NOTE  SUBJECTIVE: Tolerated first cycle of chemotherapy well overall yesterday.  This morning, she denies abdominal pain, nausea, vomiting.  Diarrhea significantly improved with reduction in lactulose dose.  Work with PT yesterday and sat on side of the bed.  Stated that she "did not care for it."  PHYSICAL EXAMINATION:  Vitals:   11/11/19 2046 11/12/19 0538  BP:  122/79  Pulse: 93 87  Resp:  20  Temp:  (!) 97.5 F (36.4 C)  SpO2: 98% 97%   Filed Weights   11/07/19 2041 11/08/19 2139 11/09/19 1609  Weight: 120.1 kg 120.1 kg 123.4 kg    Intake/Output from previous day: 05/25 0701 - 05/26 0700 In: 240 [P.O.:240] Out: 1075 [Urine:1075]  Lungs: Clear anteriorly Cardiac: Regular rate and rhythm ABDOMEN: Positive bowel sounds, abdomen nontender, hepatomegaly noted NEURO: Alert and oriented x3 Vascular: Pitting edema to the bilateral upper and lower extremities  PICC without erythema or drainage, multiple ecchymoses near PICC line insertion site  LABORATORY DATA:  I have reviewed the data as listed CMP Latest Ref Rng & Units 11/12/2019 11/11/2019 11/10/2019  Glucose 70 - 99 mg/dL 117(H) 125(H) 103(H)  BUN 8 - 23 mg/dL 28(H) 26(H) 24(H)  Creatinine 0.44 - 1.00 mg/dL 0.56 0.64 0.60  Sodium 135 - 145 mmol/L 137 137 136  Potassium 3.5 - 5.1 mmol/L 4.5 4.1 4.4  Chloride 98 - 111 mmol/L 107 106 107  CO2 22 - 32 mmol/L 21(L) 20(L) 20(L)  Calcium 8.9 - 10.3 mg/dL 8.2(L) 8.3(L) 7.9(L)  Total Protein 6.5 - 8.1 g/dL 5.1(L) 5.0(L) 4.7(L)  Total Bilirubin 0.3 - 1.2 mg/dL 2.3(H) 2.1(H) 2.7(H)  Alkaline Phos 38 - 126 U/L 1,440(H) 1,261(H) 1,158(H)  AST 15 - 41 U/L 212(H) 178(H) 199(H)  ALT 0 - 44 U/L 63(H) 57(H) 62(H)    Lab Results  Component Value Date   WBC 17.5 (H) 11/12/2019   HGB 9.7 (L) 11/12/2019   HCT 33.2 (L) 11/12/2019   MCV 104.1 (H) 11/12/2019   PLT 280 11/12/2019   NEUTROABS 14.4 (H) 11/12/2019    CT CHEST WO CONTRAST  Result Date:  10/30/2019 CLINICAL DATA:  Metastatic malignancy EXAM: CT CHEST WITHOUT CONTRAST TECHNIQUE: Multidetector CT imaging of the chest was performed following the standard protocol without IV contrast. COMPARISON:  10/29/2019, 06/18/2019 FINDINGS: Cardiovascular: No pericardial effusion. Normal caliber of the thoracic aorta. Minimal atherosclerosis. Mediastinum/Nodes: Numerous enlarged mediastinal lymph nodes are identified. Index lymph node in the right paratracheal region measures 21 x 21 mm, previous having measured 27 x 22 mm. Increased number of enlarged lymph nodes throughout the mediastinum compared to prior neck CT. The trachea, esophagus, and thyroid are unremarkable. Lungs/Pleura: There is a small spiculated sub solid nodule in the right upper lobe image 35, measuring 5 mm. No other pulmonary nodules or masses. Mild upper lobe predominant emphysema. Hypoventilatory changes at the lung bases. No large effusion or pneumothorax. Upper Abdomen: Please refer to recent abdominal CT describing numerous hepatic metastases as well as omental caking and trace ascites. Musculoskeletal: There are no acute or destructive bony lesions. Reconstructed images demonstrate no additional findings. IMPRESSION: 1. Nonspecific sub solid 5 mm right upper lobe pulmonary nodule. Neoplasm cannot be excluded in light of findings elsewhere within the chest and abdomen. 2. Extensive mediastinal lymphadenopathy worrisome for nodal metastases. 3. Aortic Atherosclerosis (ICD10-I70.0) and Emphysema (ICD10-J43.9). 4. Please refer to recent abdominal CT describing diffuse hepatic metastases. Electronically Signed   By: Randa Ngo M.D.   On: 10/30/2019 21:56  MR BRAIN W WO CONTRAST  Result Date: 10/31/2019 CLINICAL DATA:  Weakness, metastases EXAM: MRI HEAD WITHOUT AND WITH CONTRAST TECHNIQUE: Multiplanar, multiecho pulse sequences of the brain and surrounding structures were obtained without and with intravenous contrast. CONTRAST:  10m  GADAVIST GADOBUTROL 1 MMOL/ML IV SOLN COMPARISON:  09/30/2018 FINDINGS: Motion artifact is present Brain: There is no acute infarction or intracranial hemorrhage. There is no intracranial mass, mass effect, or edema. There is no hydrocephalus or extra-axial fluid collection. Ventricles and sulci are stable in size and configuration. Patchy T2 hyperintensity in the supratentorial and pontine white matter is nonspecific but probably reflects mild chronic microvascular ischemic changes. No abnormal enhancement. Vascular: Major vessel flow voids at the skull base are preserved. Skull and upper cervical spine: Normal marrow signal is preserved. There is susceptibility artifact related to partially imaged cervical spine fusion. Sinuses/Orbits: Paranasal sinuses are aerated. Orbits are unremarkable. Other: Sella is unremarkable.  Mastoid air cells are clear. IMPRESSION: Suboptimal evaluation due to motion artifact. No evidence of intracranial metastatic disease. No acute infarction or hemorrhage. Electronically Signed   By: PMacy MisM.D.   On: 10/31/2019 14:24   CT ABDOMEN PELVIS W CONTRAST  Result Date: 10/29/2019 CLINICAL DATA:  Hyperkalemia,, liver disease, emesis, weight loss EXAM: CT ABDOMEN AND PELVIS WITH CONTRAST TECHNIQUE: Multidetector CT imaging of the abdomen and pelvis was performed using the standard protocol following bolus administration of intravenous contrast. CONTRAST:  752mOMNIPAQUE IOHEXOL 300 MG/ML  SOLN COMPARISON:  06/17/2019, 01/05/2006 FINDINGS: Lower chest: No acute pleural or parenchymal lung disease. Hepatobiliary: There are innumerable masses replacing the majority of the liver parenchyma consistent with diffuse metastatic disease. The gallbladder is surgically absent. Pancreas: Unremarkable. No pancreatic ductal dilatation or surrounding inflammatory changes. Spleen: Normal in size without focal abnormality. Adrenals/Urinary Tract: Adrenal glands are unremarkable. Kidneys are  normal, without renal calculi, focal lesion, or hydronephrosis. Bladder is unremarkable. Stomach/Bowel: No bowel obstruction or ileus. No bowel wall thickening or inflammatory changes. Vascular/Lymphatic: There is pathologic adenopathy within the retroperitoneum. Largest pathologic lymph node in the left external iliac chain reference image 77 measures 3.5 x 3.1 cm. A second index lymph node at the right iliac bifurcation measures 2.6 x 2.2 cm reference image 62. There is minimal atherosclerosis of the aorta. Reproductive: Uterus is surgically absent. I do not see any adnexal lesions. Other: There is soft tissue fat stranding throughout the ventral omentum consistent with omental caking. Complex cystic mass within the left lower quadrant along the anterior abdominal wall measuring 5.7 x 7.2 cm is most consistent with a necrotic mass. Mural thickening along the anterior and lateral margin of the mass. There is trace free fluid within the upper abdomen surrounding the liver and spleen. No free intra-abdominal gas. Musculoskeletal: There are no acute or destructive bony lesions. Reconstructed images demonstrate no additional findings. IMPRESSION: 1. Diffuse hepatic metastases. 2. Omental caking. Complex cystic mass left lower quadrant consistent with necrotic lesion. 3. Pathologic retroperitoneal lymphadenopathy, most pronounced within the iliac distributions left greater than right. 4. Trace ascites. Electronically Signed   By: MiRanda Ngo.D.   On: 10/29/2019 22:15   [place]OPSY (LIVER)  Result Date: 10/31/2019 INDICATION: [age]ear old female with multifocal hepatic lesions concerning for metastatic disease. She presents for ultrasound-guided biopsy of the same. EXAM: ULTRASOUND BIOPSY CORE LIVER MEDICATIONS: None. ANESTHESIA/SEDATION: Moderate (conscious) sedation was employed during this procedure. A total of Versed 1 mg and Fentanyl 50 mcg was administered intravenously. Moderate Sedation Time: 13 minutes.  The  patient's level of consciousness and vital signs were monitored continuously by radiology nursing throughout the procedure under my direct supervision. FLUOROSCOPY TIME:  None. COMPLICATIONS: None immediate. PROCEDURE: Informed written consent was obtained from the patient after a thorough discussion of the procedural risks, benefits and alternatives. All questions were addressed. A timeout was performed prior to the initiation of the procedure. The liver was interrogated with ultrasound. There are innumerable hypoechoic solid lesions scattered throughout the liver. A suitable lesion in the left hepatic lobe was identified. The overlying skin was sterilely prepped and draped in the standard fashion. Local anesthesia was attained by infiltration with 1% lidocaine. A small dermatotomy was made. Under real-time ultrasound guidance, multiple 18 gauge core biopsies were obtained using the bio Pince automated biopsy device. Biopsies were performed coaxially through a 17 gauge introducer needle. As the introducer needle was withdrawn, the biopsy tract was embolized with a Gel-Foam slurry. Post ultrasound imaging demonstrates no evidence of immediate complication. IMPRESSION: Technically successful ultrasound-guided core biopsy of liver lesion. Electronically Signed   By: Jacqulynn Cadet M.D.   On: 10/31/2019 12:09   DG CHEST PORT 1 VIEW  Result Date: 11/05/2019 CLINICAL DATA:  Acute respiratory distress EXAM: PORTABLE CHEST 1 VIEW COMPARISON:  06/17/2019 FINDINGS: Single frontal view of the chest demonstrates an enlarged cardiac silhouette. Lung volumes are diminished. Mild increased interstitial prominence with central vascular congestion consistent with mild fluid overload. Small right pleural effusion. No pneumothorax. IMPRESSION: 1. Findings suggesting mild interstitial edema. Electronically Signed   By: Randa Ngo M.D.   On: 11/05/2019 22:51   DG Swallowing Func-Speech Pathology  Result Date:  11/06/2019 Objective Swallowing Evaluation: Type of Study: MBS-Modified Barium Swallow Study  Patient Details Name: ADELISE BUSWELL MRN: [id number] Date of Birth: 02/27/1946 Today's Date: 11/06/2019 Time: SLP Start Time (ACUTE ONLY): 18 -SLP Stop Time (ACUTE ONLY): 1300 SLP Time Calculation (min) (ACUTE ONLY): 30 min Past Medical History: Past Medical History: Diagnosis Date . ADD (attention deficit disorder)  . Anxiety  . Arthritis   os . Breast cancer ([place])  . Diabetes mellitus without complication ([place])  . GERD (gastroesophageal reflux disease)   barretts . Headache  . High cholesterol  . Neck pain  . Obesity  . Personal history of radiation therapy 2005 . Sleep apnea   cpap   last sleep study> 3 yrs . Swelling of both ankles  . Weakness 06/2019 Past Surgical History: Past Surgical History: Procedure Laterality Date . ABDOMINAL HYSTERECTOMY   . ANTERIOR CERVICAL DECOMP/DISCECTOMY FUSION N/A 02/15/2016  Procedure: Cervical three four-Cervical five-six, Cervical six-seven Anterior cervical decompression/diskectomy/fusion;  Surgeon: Kristeen Miss, MD;  Location: [facility];  Service: Neurosurgery;  Laterality: N/A; . bicept surgery   . BREAST LUMPECTOMY   . CHOLECYSTECTOMY   . JOINT REPLACEMENT   . PAROTID GLAND TUMOR EXCISION   . TONSILLECTOMY   . TOTAL KNEE ARTHROPLASTY  07/19/2012  Procedure: TOTAL KNEE ARTHROPLASTY;  Surgeon: Yvette Rack., MD;  Location: [place];  Service: Orthopedics;  Laterality: Right;  right total knee arthroplasty . TUBAL LIGATION   HPI: Pt is a 74 year old female admitted with medical decline and abnormal labs, found to have signs of malignant breast cancer with hepatic metastases. Admitted with subacute abdominal pain waxing waning over 3 months more acutely 3 weeks ago developed worsening weakness poor appetite nausea.  Has had difficulty swallowing with some question of a neck mass and thrust. CT shows Extensive mediastinal lymphadenopathy worrisome for nodal metastases.  No data  recorded Assessment / Plan / Recommendation CHL IP CLINICAL IMPRESSIONS 11/06/2019 Clinical Impression  Pt demonstrates mild oropharyngeal dysphagia, appearing primarily due to decreased awareness and mentation. Pt kept her eyes closed during session but followed commands as needed, When taking sips, there were instances of brief oral holding and also slight premature spillage. Airway closure was not complete at times during the swallow leading to instances of flash frank penetration. Typically no cough was elicited during these events, but as study progressed pt coughed more and mroe often, not appearing to be related to pharyngeal impairment. Esophageal sweep showed some distal residue and pill needed several bolus to fully clear. Pt is capable of regular solids and thin liquids but she should only eat and drink when fully alert. Will f/u for tolerance of diet and further modifications as needed.  SLP Visit Diagnosis Dysphagia, unspecified (R13.10) Attention and concentration deficit following -- Frontal lobe and executive function deficit following -- Impact on safety and function Mild aspiration risk;Risk for inadequate nutrition/hydration   CHL IP TREATMENT RECOMMENDATION 11/06/2019 Treatment Recommendations Therapy as outlined in treatment plan below   No flowsheet data found. CHL IP DIET RECOMMENDATION 11/06/2019 SLP Diet Recommendations -- Liquid Administration via Cup;Straw Medication Administration Whole meds with liquid Compensations -- Postural Changes Seated upright at 90 degrees;Remain semi-upright after after feeds/meals (Comment)   No flowsheet data found.  CHL IP FOLLOW UP RECOMMENDATIONS 11/06/2019 Follow up Recommendations 24 hour supervision/assistance   CHL IP FREQUENCY AND DURATION 11/06/2019 Speech Therapy Frequency (ACUTE ONLY) min 1 x/week Treatment Duration 1 week      CHL IP ORAL PHASE 11/06/2019 Oral Phase Impaired Oral - Pudding Teaspoon -- Oral - Pudding Cup -- Oral - Honey Teaspoon -- Oral -  Honey Cup -- Oral - Nectar Teaspoon -- Oral - Nectar Cup -- Oral - Nectar Straw -- Oral - Thin Teaspoon Premature spillage;Decreased bolus cohesion Oral - Thin Cup -- Oral - Thin Straw Delayed oral transit Oral - Puree Delayed oral transit Oral - Mech Soft -- Oral - Regular Delayed oral transit Oral - Multi-Consistency -- Oral - Pill -- Oral Phase - Comment --  CHL IP PHARYNGEAL PHASE 11/06/2019 Pharyngeal Phase Impaired Pharyngeal- Pudding Teaspoon -- Pharyngeal -- Pharyngeal- Pudding Cup -- Pharyngeal -- Pharyngeal- Honey Teaspoon -- Pharyngeal -- Pharyngeal- Honey Cup -- Pharyngeal -- Pharyngeal- Nectar Teaspoon -- Pharyngeal -- Pharyngeal- Nectar Cup -- Pharyngeal -- Pharyngeal- Nectar Straw -- Pharyngeal -- Pharyngeal- Thin Teaspoon Penetration/Aspiration before swallow;Penetration/Aspiration during swallow Pharyngeal Material enters airway, remains ABOVE vocal cords then ejected out Pharyngeal- Thin Cup -- Pharyngeal -- Pharyngeal- Thin Straw Penetration/Aspiration before swallow;Penetration/Aspiration during swallow Pharyngeal Material enters airway, CONTACTS cords and then ejected out;Material enters airway, remains ABOVE vocal cords then ejected out;Material does not enter airway Pharyngeal- Puree WFL Pharyngeal -- Pharyngeal- Mechanical Soft -- Pharyngeal -- Pharyngeal- Regular WFL Pharyngeal -- Pharyngeal- Multi-consistency -- Pharyngeal -- Pharyngeal- Pill WFL Pharyngeal -- Pharyngeal Comment --  No flowsheet data found. DeBlois, Katherene Ponto 11/06/2019, 2:48 PM              VAS [place] UPPER EXTREMITY VENOUS DUPLEX  Result Date: 11/07/2019 UPPER VENOUS STUDY  Indications: Edema Limitations: Body habitus, poor ultrasound/tissue interface and patient unable to cooperate. Patient position. Comparison Study: No prior study Performing Technologist: Maudry Mayhew MHA, RDMS, RVT, RDCS  Examination Guidelines: A complete evaluation includes B-mode imaging, spectral Doppler, color Doppler, and power Doppler  as needed of all accessible portions of each vessel. Bilateral testing is  considered an integral part of a complete examination. Limited examinations for reoccurring indications may be performed as noted.  Right Findings: +----------+------------+---------+-----------+----------+--------------+ RIGHT     CompressiblePhasicitySpontaneousProperties   Summary     +----------+------------+---------+-----------+----------+--------------+ Subclavian                                          Not visualized +----------+------------+---------+-----------+----------+--------------+  Left Findings: +----------+------------+---------+-----------+----------+--------------+ LEFT      CompressiblePhasicitySpontaneousProperties   Summary     +----------+------------+---------+-----------+----------+--------------+ IJV           Full       Yes       Yes                             +----------+------------+---------+-----------+----------+--------------+ Subclavian    Full       Yes       Yes                             +----------+------------+---------+-----------+----------+--------------+ Axillary      Full       Yes       Yes                             +----------+------------+---------+-----------+----------+--------------+ Brachial      Full       Yes       Yes                             +----------+------------+---------+-----------+----------+--------------+ Radial        Full                                                 +----------+------------+---------+-----------+----------+--------------+ Ulnar                                               Not visualized +----------+------------+---------+-----------+----------+--------------+ Cephalic      Full                                                 +----------+------------+---------+-----------+----------+--------------+ Basilic                                             Not visualized  +----------+------------+---------+-----------+----------+--------------+  Summary:  Left: No evidence of deep vein thrombosis in the upper extremity. No evidence of superficial vein thrombosis in the upper extremity. This study was limited due to technical limitations listed above.  *See table(s) above for measurements and observations.  Diagnosing physician: Servando Snare MD Electronically signed by Servando Snare MD on 11/07/2019 at 3:03:30 PM.    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 EXAM: ULTRASOUND AND FLUOROSCOPIC GUIDED PICC LINE INSERTION MEDICATIONS: 1% lidocaine to skin and subcutaneous tissue ANESTHESIA/SEDATION: None FLUOROSCOPY TIME:  Fluoroscopy Time:  42 seconds (7 mGy). COMPLICATIONS: None immediate. TECHNIQUE: The procedure, risks, benefits, and alternatives were explained to the patient and informed written consent was obtained. A timeout was performed prior to the initiation of the procedure. The right upper extremity was prepped with chlorhexidine in a sterile fashion, and a sterile drape was applied covering the operative field. Maximum barrier sterile technique with sterile gowns and gloves were used for the procedure. A timeout was performed prior to the initiation of the procedure. Local anesthesia was provided with 1% lidocaine. Under direct ultrasound guidance, the right brachial vein was accessed with a micropuncture kit after the overlying soft tissues were anesthetized with 1% lidocaine. An ultrasound image was saved for documentation purposes. A guidewire was advanced to the level of the superior caval-atrial junction for measurement purposes and the PICC line was cut to length. A peel-away sheath was placed and a 39 cm, 5 [place], dual lumen was inserted to level of the superior caval-atrial junction. A  post procedure spot fluoroscopic was obtained. The catheter easily aspirated and flushed and was sutured in place. A dressing was placed. The patient tolerated the procedure well without immediate post procedural complication. FINDINGS: After catheter placement, the tip lies within the superior cavoatrial junction the catheter aspirates and flushes normally and is ready for immediate use. IMPRESSION: Successful ultrasound and fluoroscopic guided placement of a right brachial vein approach, 39 cm, 5 [place], dual lumen PICC with tip at the superior caval-atrial junction. The PICC line is ready for immediate use. Read by: Rowe Robert, PA-C Electronically Signed   By: Aletta Edouard M.D.   On: 11/10/2019 13:39   [place] EKG SITE RITE  Result Date: 11/07/2019 If Site Rite image not attached, placement could not be confirmed due to current cardiac rhythm.   ASSESSMENT AND PLAN: 1.  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  Primary peritoneal carcinoma favored given history of hysterectomy with negative pathology.  -10/29/2019 CEA 4.8, CA 19.9 441, AFP 2.1   -10/30/2019 CA 27.29 6728  -11/06/2019 CA-125 14,072 2.  Elevated total bilirubin and LFTs 3.  AKI secondary to dehydration 4.  Hypercalcemia-status post Zometa and calcitonin, improved 5.  Deconditioning 6.  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  Diabetes mellitus 8.  Temporal arteritis, status post treatment 9.  Migraine headaches 10.  Leg weakness-steroid myopathy?,  Paraneoplastic syndrome?  Ms Kuk appears stable.  Tolerated first dose of carboplatin and Taxol well overall.  Labs from this morning have been reviewed and are overall stable.  Diarrhea improved following reduction in lactulose dosing.  Remains edematous.  Working with  PT/OT.  Recommendations: 1.  Recommend diuresis per hospitalist. 2.  She was encouraged to continue to work with PT/OT to gain back some of her strength. 3.  Disposition pending-PT recommending CIR-SNF/LTAC.   LOS: 13 days   Mikey Bussing 11/12/19  Ms. Margaretmary Bayley was interviewed and examined.  She tolerated the first cycle of Taxol/carboplatin without acute toxicity.  She remains alert and oriented.  I recommend continuing PT/OT and beginning diuresis. We will schedule cycle 2 Taxol/carboplatin as an outpatient.  Julieanne Manson, MD

## 2019-11-13 LAB — COMPREHENSIVE METABOLIC PANEL
ALT: 87 U/L — ABNORMAL HIGH (ref 0–44)
AST: 273 U/L — ABNORMAL HIGH (ref 15–41)
Albumin: 1.8 g/dL — ABNORMAL LOW (ref 3.5–5.0)
Alkaline Phosphatase: 1434 U/L — ABNORMAL HIGH (ref 38–126)
Anion gap: 15 (ref 5–15)
BUN: 32 mg/dL — ABNORMAL HIGH (ref 8–23)
CO2: 19 mmol/L — ABNORMAL LOW (ref 22–32)
Calcium: 7.8 mg/dL — ABNORMAL LOW (ref 8.9–10.3)
Chloride: 103 mmol/L (ref 98–111)
Creatinine, Ser: 0.9 mg/dL (ref 0.44–1.00)
GFR calc Af Amer: >60 mL/min (ref >60)
GFR calc non Af Amer: >60 mL/min (ref >60)
Glucose, Bld: 125 mg/dL — ABNORMAL HIGH (ref 70–99)
Potassium: 4.8 mmol/L (ref 3.5–5.1)
Sodium: 137 mmol/L (ref 135–145)
Total Bilirubin: 2.6 mg/dL — ABNORMAL HIGH (ref 0.3–1.2)
Total Protein: 4.9 g/dL — ABNORMAL LOW (ref 6.5–8.1)

## 2019-11-13 LAB — CBC
HCT: 34.9 % — ABNORMAL LOW (ref 36.0–46.0)
Hemoglobin: 10.4 g/dL — ABNORMAL LOW (ref 12.0–15.0)
MCH: 30.6 pg (ref 26.0–34.0)
MCHC: 29.8 g/dL — ABNORMAL LOW (ref 30.0–36.0)
MCV: 102.6 fL — ABNORMAL HIGH (ref 80.0–100.0)
Platelets: 264 10*3/uL (ref 150–400)
RBC: 3.4 MIL/uL — ABNORMAL LOW (ref 3.87–5.11)
RDW: 23 % — ABNORMAL HIGH (ref 11.5–15.5)
WBC: 14.8 10*3/uL — ABNORMAL HIGH (ref 4.0–10.5)
nRBC: 1.3 % — ABNORMAL HIGH (ref 0.0–0.2)

## 2019-11-13 LAB — MAGNESIUM: Magnesium: 1.8 mg/dL (ref 1.7–2.4)

## 2019-11-13 MED ORDER — ALBUMIN HUMAN 5 % IV SOLN
25.0000 g | Freq: Once | INTRAVENOUS | Status: AC
  Administered 2019-11-13: 25 g via INTRAVENOUS
  Filled 2019-11-13: qty 500

## 2019-11-13 MED ORDER — FUROSEMIDE 10 MG/ML IJ SOLN
60.0000 mg | Freq: Two times a day (BID) | INTRAMUSCULAR | Status: DC
  Administered 2019-11-13: 60 mg via INTRAVENOUS
  Filled 2019-11-13: qty 6

## 2019-11-13 NOTE — Progress Notes (Signed)
Physical Therapy Treatment Patient Details Name: [name] MRN: [id number] DOB: 21-Feb-1946 Today's Date: 11/13/2019    History of Present Illness Patient is 74 y.o. female with PMH significant for DM, temporal arteritis, breast cancer, OSA on CPAP, Rt TKA, and ACDF. Pt presented to ED at [facility] on 10/29/19 and was admitted with waxing and waning abdominal pain for 3 months that has gotten worse over the last 3 weeks, generalized weakness, poor appetite and nausea. In ED, pt found to have hepatic metastasis, omental caking, complex cystic LLQ mass retroperitoneal adenopathy. Pt underwent liver biopsy by IR on 5/14. 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 Pt transferred to [facility] on 5/23. IR was placed PICC line on 5/24 and pt initiated chemotherapy on 5/25.     PT Comments    Patient remains limited by fatigue during therapy session but is agreeable to participate in bed mobility and exercises for ROM and to facilitate edema management. Patient continues to require Max Assist +2 for rolling to maintain sidelying position and to initiate/complete trunk roll. Pt was able to complete LE/UE exercises for edema control. Pt's husband educated on safe technique for retrograde massage to manage edema. Pt was able to verbalize if her husband's pressure was light enough or too much. She will continue to benefit from skilled PT interventions. Acute PT will follow and progress as able.   Follow Up Recommendations  SNF     Equipment Recommendations  Hospital bed;Other (comment);Wheelchair (measurements PT);Wheelchair cushion (measurements PT)(hoyer lift)    Recommendations for Other Services       Precautions / Restrictions Precautions Precautions: Fall Restrictions Weight Bearing Restrictions: No    Mobility  Bed Mobility Overal bed mobility: Needs Assistance Bed Mobility: Rolling Rolling: Max assist;+2 for safety/equipment;+2 for  physical assistance         General bed mobility comments: At arrival pt required assist with rolling for linen change and cleansing due to BM. Pt follows cue for reaching UE's to bed rail to initiate upper trunk rolling but requires max assist to complete. Pt also completed knee flexion bil to initiate lower trunk rolling but Max assist required to stabilie LE and complete lower trunk rotation to roll. pt rolled 1x each direction and fatigued at end declining to sit up EOB despite education on importance. pt agreeable to perform exercises.   Transfers   Ambulation/Gait    Stairs        Wheelchair Mobility    Modified Rankin (Stroke Patients Only)          Cognition Arousal/Alertness: Awake/alert Behavior During Therapy: WFL for tasks assessed/performed Overall Cognitive Status: Impaired/Different from baseline Area of Impairment: Attention;Memory;Following commands;Awareness;Problem solving                   Current Attention Level: Focused   Following Commands: Follows one step commands consistently   Awareness: Emergent Problem Solving: Decreased initiation        Exercises General Exercises - Upper Extremity Digit Composite Flexion: Squeeze ball;10 reps;Left;Right;AROM(used towel) General Exercises - Lower Extremity Ankle Circles/Pumps: AROM;Both;10 reps;Supine Heel Slides: AROM;Both;10 reps;Supine(light resistance with LE extension at foot) Other Exercises Other Exercises: AROM for bil UE serratus punch, 3x each throughout session while repositioning and donning clean gown.  Other Exercises: Educated pt/husband on light retrograde massage for edema management on bil LE's and at Bil hand up to forearm on Rt and shoulder on Lt. pt husband demonstrated soft technique and pt verbalized pressure  matched the therapists. ~10 minutes performed for education and treatment.    General Comments        Pertinent Vitals/Pain Pain Assessment: Faces Faces Pain  Scale: Hurts little more Pain Location: generalized discomfort, buttock pain Pain Descriptors / Indicators: Discomfort;Grimacing Pain Intervention(s): Limited activity within patient's tolerance;Repositioned;Monitored during session           PT Goals (current goals can now be found in the care plan section) Acute Rehab PT Goals Patient Stated Goal: to get stronger PT Goal Formulation: With patient Time For Goal Achievement: 11/25/19 Potential to Achieve Goals: Fair Progress towards PT goals: Progressing toward goals(slowly - limited by fatigue and pain)    Frequency    Min 2X/week      PT Plan Current plan remains appropriate    Co-evaluation              AM-PAC PT "6 Clicks" Mobility   Outcome Measure  Help needed turning from your back to your side while in a flat bed without using bedrails?: Total Help needed moving from lying on your back to sitting on the side of a flat bed without using bedrails?: Total Help needed moving to and from a bed to a chair (including a wheelchair)?: Total Help needed standing up from a chair using your arms (e.g., wheelchair or bedside chair)?: Total Help needed to walk in hospital room?: Total Help needed climbing 3-5 steps with a railing? : Total 6 Click Score: 6    End of Session   Activity Tolerance: Patient limited by pain;Patient limited by fatigue Patient left: in bed;with call bell/phone within reach;with bed alarm set;with family/visitor present Nurse Communication: Mobility status PT Visit Diagnosis: Muscle weakness (generalized) (M62.81);Other abnormalities of gait and mobility (R26.89)     Time: [id number] PT Time Calculation (min) (ACUTE ONLY): 47 min  Charges:  $Therapeutic Exercise: 8-22 mins $Therapeutic Activity: 8-22 mins $Massage: 8-22 mins                     Verner Mould, DPT Physical Therapist with [facility] [phone]  11/13/2019 2:52 PM

## 2019-11-13 NOTE — Progress Notes (Addendum)
Nutrition Follow-up  DOCUMENTATION CODES:   Morbid obesity  INTERVENTION:   -Ensure Enlive po TID, each supplement provides 350 kcal and 20 grams of protein -Magic cup BID with meals, each supplement provides 290 kcal and 9 grams of protein  NUTRITION DIAGNOSIS:   Inadequate oral intake related to poor appetite as evidenced by per patient/family report.  Ongoing.  GOAL:   Patient will meet greater than or equal to 90% of their needs  Progressing.  MONITOR:   PO intake, Supplement acceptance, Weight trends, Labs, I & O's  REASON FOR ASSESSMENT:   Consult Assessment of nutrition requirement/status  ASSESSMENT:   Pt presented with a 3 month h/o abdominal pain and a 3 week h/o generalized weakness, anorexia, N/V. 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  Patient currently consuming 0-50% of meals. Pt ate 50% of breakfast this morning (providing ~360 kcals and 24g protein) and yesterday she ate 25% of breakfast (providing ~170 kcals and 12g protein). Nothing documented for lunch or dinner yesterday. Will continue Ensure supplements as pt is drinking these and add Magic Cups on meal trays.   Admission weight: 223 lbs. Current weight: 272 lbs. Weights are increasing. Per nursing documentation, pt with moderate generalized edema and severe BLE edema.  I/Os:+8.4L since admit UOP: 2.8L x 24 hrs  Medications: IV Lasix, Lactulose, IV Zofran  Labs reviewed: Low Phos  Mg WNL  Diet Order:   Diet Order            Diet regular Room service appropriate? Yes; Fluid consistency: Thin  Diet effective now              EDUCATION NEEDS:   Not appropriate for education at this time  Skin:  Skin Assessment: Skin Integrity Issues: Skin Integrity Issues:: Stage II, Incisions Stage II: sacrum Incisions: neck, abdomen  Last BM:  5/27 -type  5  Height:   Ht Readings from Last 1 Encounters:  11/09/19 5\' 4"  (1.626 m)    Weight:   Wt Readings from Last 1 Encounters:  11/09/19 123.4 kg    Ideal Body Weight:     BMI:  Body mass index is 46.69 kg/m.  Estimated Nutritional Needs:   Kcal:  1900-2100  Protein:  95-105 grams  Fluid:  >1.9L/d  Clayton Bibles, MS, RD, LDN Inpatient Clinical Dietitian Contact information available via Amion

## 2019-11-13 NOTE — Progress Notes (Signed)
PROGRESS NOTE    [name]    Code Status: DNR  [id number] DOB: 07-10-1945 DOA: 10/29/2019 LOS: 14 days  PCP: Leonides Sake, MD CC:  Chief Complaint  Patient presents with  . Abdominal Pain  . Abnormal Lab       Hospital Summary   This is a 74 year old female with history of type 2 diabetes, temporal arteritis, migraine, OSA and cervical radiculopathy who was admitted with waxing waning abdominal pain x3 months which had become worse 3 weeks prior to admission with generalized weakness and poor appetite as well as nausea who presented to her PCP and sent to the ED due to concerns of underlying malignancy.  In the ED she was found to have hepatic metastases, omental caking, complex cystic left lower quadrant mass and retroperitoneal adenopathy as well as hyponatremia, hypocalcemia and AKI.  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  Oncology and palliative care have been consulted.  She started on chemotherapy this hospitalization.    A & P   Principal Problem:   Metastatic neoplastic disease ([place]) Active Problems:   Uncontrolled type 2 diabetes mellitus with hyperglycemia, with long-term current use of insulin (HCC)   Hypercalcemia   AKI (acute kidney injury) ([place])   Dehydration with hyponatremia   GERD without esophagitis   Metastatic malignant neoplasm [facility])   Palliative care by specialist   Goals of care, counseling/discussion   DNR (do not resuscitate)   Hypercalcemia of malignancy   Hepatic encephalopathy ([place])   Palliative care encounter   Pressure injury of skin   Hepatic dysfunction   1. 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 Had liver biopsy on 5/14 b. PICC line placement on 5/24 c. Started chemotherapy with carboplatin and Taxol on  5/25 d. Appreciate oncology and palliative care recommendations  2. Acute encephalopathy, likely multifactorial: Metabolic and hepatic a. Had elevated ammonia level which improved initially with lactulose however she had multiple BMs and this was decreased b. Currently awake and alert but oriented x1, improved from yesterday and more conversant c. Continue lactulose and titrate for 2-3 loose BMs daily  3. Transaminitis a. Possibly from diffuse hepatic mets in addition to chemo? b. Had CT abdomen pelvis with contrast on 5/12 c. will check RUQ ultrasound  4. Anasarca a. Increase Lasix to 60 mg IV twice daily with addition of albumin given hypoalbuminemia b. Nutrition on board  5. NAGMA, improved a. Continue to monitor  6. Hypercalcemia, resolved a. Received Zometa x1  7. Hypophosphatemia a. Follow-up in a.m. b. Nutrition consult  8. Leukocytosis, downtrending a. Possibly reactive to underlying malignancy and illness b. No obvious signs of infection c. Continue to monitor for now   DVT prophylaxis: Heparin Family Communication: Husband at bedside Disposition Plan:  Status is: Inpatient  Remains inpatient appropriate because:Altered mental status, Ongoing diagnostic testing needed not appropriate for outpatient work up and IV treatments appropriate due to intensity of illness or inability to take PO   Dispo: The patient is from: Home              Anticipated d/c is to: TBD              Anticipated d/c date is: 3 days              Patient currently is not medically  stable to d/c.           Pressure injury documentation   Pressure Injury 11/06/19 Sacrum Stage 2 -  Partial thickness loss of dermis presenting as a shallow open injury with a red, pink wound bed without slough. (Active)  11/06/19 1500  Location: Sacrum  Location Orientation:   Staging: Stage 2 -  Partial thickness loss of dermis presenting as a shallow open injury with a red, pink wound bed without  slough.  Wound Description (Comments):   Present on Admission:    None  Consultants  Oncology Palliative care   Procedures  5/24 PICC line placement Started chemotherapy 5/25   Antibiotics   Anti-infectives (From admission, onward)   Start     Dose/Rate Route Frequency Ordered Stop   11/01/19 1615  fluconazole (DIFLUCAN) tablet 150 mg     150 mg Oral  Once 11/01/19 1609 11/01/19 1713        Subjective   More conversant today, denies any complaints currently.  Husband at bedside.  No overnight events.  Objective   Vitals:   11/12/19 1932 11/12/19 1945 11/13/19 0521 11/13/19 1356  BP:  124/73 125/80 121/80  Pulse: 98 94 96 89  Resp: 14 19 19 16   Temp:  97.7 F (36.5 C) (!) 97.5 F (36.4 C) 98.1 F (36.7 C)  TempSrc:  Oral  Oral  SpO2: 97% 97% 96% 94%  Weight:      Height:        Intake/Output Summary (Last 24 hours) at 11/13/2019 1850 Last data filed at 11/13/2019 1815 Gross per 24 hour  Intake 600 ml  Output 1400 ml  Net -800 ml   Filed Weights   11/07/19 2041 11/08/19 2139 11/09/19 1609  Weight: 120.1 kg 120.1 kg 123.4 kg    Examination:  Physical Exam Vitals and nursing note reviewed.  Constitutional:      Appearance: She is obese.  HENT:     Head: Normocephalic.  Cardiovascular:     Rate and Rhythm: Normal rate and regular rhythm.  Pulmonary:     Effort: Pulmonary effort is normal. No respiratory distress.  Abdominal:     General: There is no distension.     Tenderness: There is no abdominal tenderness.     Comments: Firm abdomen  Musculoskeletal:     Comments: Diffuse pitting edema to 3+  Neurological:     Mental Status: She is alert. She is disoriented.     Comments: Awake and alert, oriented x1 to place  Psychiatric:        Mood and Affect: Mood normal. Mood is not anxious.     Data Reviewed: I have personally reviewed following labs and imaging studies  CBC: Recent Labs  Lab 11/07/19 0801 11/07/19 0801 11/08/19 1008  11/10/19 0443 11/11/19 0543 11/12/19 0336 11/13/19 0500  WBC 19.2*   < > 24.6* 19.5* 20.4* 17.5* 14.8*  NEUTROABS 14.4*  --  20.0*  --  17.0* 14.4*  --   HGB 11.5*   < > 11.4* 9.4* 9.3* 9.7* 10.4*  HCT 37.4   < > 37.0 30.8* 30.4* 33.2* 34.9*  MCV 99.7   < > 100.3* 100.7* 100.3* 104.1* 102.6*  PLT 300   < > PLATELET CLUMPS NOTED ON SMEAR, UNABLE TO ESTIMATE 227 264 280 264   < > = values in this interval not displayed.   Basic Metabolic Panel: Recent Labs  Lab 11/08/19 1008 11/10/19 0443 11/11/19 0543 11/12/19 0336 11/13/19 0500  NA  139 136 137 137 137  K 3.8 4.4 4.1 4.5 4.8  CL 108 107 106 107 103  CO2 18* 20* 20* 21* 19*  GLUCOSE 133* 103* 125* 117* 125*  BUN 19 24* 26* 28* 32*  CREATININE 0.73 0.60 0.64 0.56 0.90  CALCIUM 8.7* 7.9* 8.3* 8.2* 7.8*  MG 1.8 1.9 1.9 2.1 1.8  PHOS 1.5* 2.6 2.1* 2.1*  --    GFR: Estimated Creatinine Clearance: 73.3 mL/min (by C-G formula based on SCr of 0.9 mg/dL). Liver Function Tests: Recent Labs  Lab 11/08/19 1008 11/10/19 0443 11/11/19 0543 11/12/19 0336 11/13/19 0500  AST 313* 199* 178* 212* 273*  ALT 87* 62* 57* 63* 87*  ALKPHOS 1,282* 1,158* 1,261* 1,440* 1,434*  BILITOT 3.0* 2.7* 2.1* 2.3* 2.6*  PROT 5.1* 4.7* 5.0* 5.1* 4.9*  ALBUMIN 1.5* 1.6* 2.0* 1.9* 1.8*   No results for input(s): LIPASE, AMYLASE in the last 168 hours. Recent Labs  Lab 11/08/19 1142 11/10/19 0443 11/12/19 1130  AMMONIA 67* 54* 47*   Coagulation Profile: Recent Labs  Lab 11/10/19 0443  INR 1.2   Cardiac Enzymes: No results for input(s): CKTOTAL, CKMB, CKMBINDEX, TROPONINI in the last 168 hours. BNP (last 3 results) No results for input(s): PROBNP in the last 8760 hours. HbA1C: No results for input(s): HGBA1C in the last 72 hours. CBG: Recent Labs  Lab 11/07/19 0410 11/07/19 0526 11/07/19 0821 11/07/19 1159 11/08/19 1113  GLUCAP 78 95 89 95 122*   Lipid Profile: No results for input(s): CHOL, HDL, LDLCALC, TRIG, CHOLHDL, LDLDIRECT  in the last 72 hours. Thyroid Function Tests: No results for input(s): TSH, T4TOTAL, FREET4, T3FREE, THYROIDAB in the last 72 hours. Anemia Panel: No results for input(s): VITAMINB12, FOLATE, FERRITIN, TIBC, IRON, RETICCTPCT in the last 72 hours. Sepsis Labs: No results for input(s): PROCALCITON, LATICACIDVEN in the last 168 hours.  Recent Results (from the past 240 hour(s))  Culture, Urine     Status: Abnormal (Preliminary result)   Collection Time: 11/11/19  6:45 PM   Specimen: Urine, Random  Result Value Ref Range Status   Specimen Description   Final    URINE, RANDOM Performed at [facility] [address], [place], [place] [address]    Special Requests   Final    NONE Performed at [facility], [place] [address], [place], [place] [address]    Culture (A)  Final    >=100,000 COLONIES/mL PROTEUS VULGARIS >=100,000 COLONIES/mL ESCHERICHIA COLI    Report Status PENDING  Incomplete         Radiology Studies: No results found.      Scheduled Meds: . Chlorhexidine Gluconate Cloth  6 each Topical Daily  . citalopram  10 mg Oral Daily  . feeding supplement (ENSURE ENLIVE)  237 mL Oral TID BM  . fluticasone  1 spray Each Nare BID  . furosemide  60 mg Intravenous BID  . lactulose  30 g Oral Daily  . nystatin  5 mL Mouth/Throat QID  . nystatin   Topical TID  . pantoprazole  40 mg Oral Q supper   Continuous Infusions: . sodium chloride    . famotidine (PEPCID) IV (ONCOLOGY)       Time spent: 33 minutes with over 50% of the time coordinating the patient's care    Harold Hedge, DO [facility] Pager [phone]  Call night coverage person covering after 7pm

## 2019-11-13 NOTE — TOC Progression Note (Signed)
Transition of Care [facility]) - Progression Note    Patient Details  Name: [name] MRN: [id number] Date of Birth: May 31, 1946  Transition of Care [facility] CM/SW Contact  Carianna Lague, Marjie Skiff, RN Phone Number: 11/13/2019, 2:04 PM  Clinical Narrative:    This CM spoke with both pt and husband [name] at bedside. Plan is still for SNF at dc. Pt will not have chemo again for 3 weeks.  This CM faxed out FL2 and started insurance auth with [facility] ref# [id number]. Bed offers to be given to pt and husband when available. Pt prefers either [facility] location or [facility] in [place]. TOC will continue to follow.  Expected Discharge Plan: Skilled Nursing Facility Barriers to Discharge: Continued Medical Work up  Expected Discharge Plan and Services Expected Discharge Plan: Wilson arrangements for the past 2 months: Single Family Home                    Social Determinants of Health (SDOH) Interventions    Readmission Risk Interventions Readmission Risk Prevention Plan 11/10/2019  Transportation Screening Complete  PCP or Specialist Appt within 3-5 Days Complete  HRI or [place] Complete  Social Work Consult for [place] Planning/Counseling Complete  Palliative Care Screening Complete  Medication Review [employer]) Complete  Some recent data might be hidden

## 2019-11-14 ENCOUNTER — Inpatient Hospital Stay (HOSPITAL_COMMUNITY): Payer: Medicare Other

## 2019-11-14 LAB — COMPREHENSIVE METABOLIC PANEL
ALT: 74 U/L — ABNORMAL HIGH (ref 0–44)
AST: 202 U/L — ABNORMAL HIGH (ref 15–41)
Albumin: 2.3 g/dL — ABNORMAL LOW (ref 3.5–5.0)
Alkaline Phosphatase: 1325 U/L — ABNORMAL HIGH (ref 38–126)
Anion gap: 14 (ref 5–15)
BUN: 37 mg/dL — ABNORMAL HIGH (ref 8–23)
CO2: 22 mmol/L (ref 22–32)
Calcium: 8 mg/dL — ABNORMAL LOW (ref 8.9–10.3)
Chloride: 106 mmol/L (ref 98–111)
Creatinine, Ser: 0.67 mg/dL (ref 0.44–1.00)
GFR calc Af Amer: >60 mL/min (ref >60)
GFR calc non Af Amer: >60 mL/min (ref >60)
Glucose, Bld: 117 mg/dL — ABNORMAL HIGH (ref 70–99)
Potassium: 4.4 mmol/L (ref 3.5–5.1)
Sodium: 142 mmol/L (ref 135–145)
Total Bilirubin: 3.7 mg/dL — ABNORMAL HIGH (ref 0.3–1.2)
Total Protein: 4.7 g/dL — ABNORMAL LOW (ref 6.5–8.1)

## 2019-11-14 LAB — CBC
HCT: 30.4 % — ABNORMAL LOW (ref 36.0–46.0)
Hemoglobin: 9.3 g/dL — ABNORMAL LOW (ref 12.0–15.0)
MCH: 31.8 pg (ref 26.0–34.0)
MCHC: 30.6 g/dL (ref 30.0–36.0)
MCV: 104.1 fL — ABNORMAL HIGH (ref 80.0–100.0)
Platelets: 195 10*3/uL (ref 150–400)
RBC: 2.92 MIL/uL — ABNORMAL LOW (ref 3.87–5.11)
RDW: 22.2 % — ABNORMAL HIGH (ref 11.5–15.5)
WBC: 18.2 10*3/uL — ABNORMAL HIGH (ref 4.0–10.5)
nRBC: 0.6 % — ABNORMAL HIGH (ref 0.0–0.2)

## 2019-11-14 LAB — URINE CULTURE: Culture: >=100000 — AB

## 2019-11-14 MED ORDER — ALBUMIN HUMAN 5 % IV SOLN
25.0000 g | Freq: Once | INTRAVENOUS | Status: AC
  Administered 2019-11-14: 25 g via INTRAVENOUS
  Filled 2019-11-14: qty 500

## 2019-11-14 MED ORDER — FUROSEMIDE 10 MG/ML IJ SOLN
60.0000 mg | Freq: Two times a day (BID) | INTRAMUSCULAR | Status: DC
  Administered 2019-11-14 – 2019-11-17 (×6): 60 mg via INTRAVENOUS
  Filled 2019-11-14 (×6): qty 6

## 2019-11-14 NOTE — Progress Notes (Signed)
Daily Progress Note   Patient Name: [name]       Date: 11/14/2019 DOB: 06-22-1945  Age: 74 y.o. MRN#: [id number] Attending Physician: Patricia Hedge, MD Primary Care Physician: Patricia Sake, MD Admit Date: 10/29/2019  Reason for Consultation/Follow-up: Disposition, Non pain symptom management, Pain control and Psychosocial/spiritual support  Subjective: Patient lying in bed awake and alert - participating in conversation. Denies nausea, vomiting, shortness of breath, or pain. Her main concern during visit was that she is hungry and wanted to know when she could eat.   Length of Stay: 15  Current Medications: Scheduled Meds:  . Chlorhexidine Gluconate Cloth  6 each Topical Daily  . citalopram  10 mg Oral Daily  . feeding supplement (ENSURE ENLIVE)  237 mL Oral TID BM  . fluticasone  1 spray Each Nare BID  . lactulose  30 g Oral Daily  . nystatin  5 mL Mouth/Throat QID  . nystatin   Topical TID  . pantoprazole  40 mg Oral Q supper    Continuous Infusions: . sodium chloride    . famotidine (PEPCID) IV (ONCOLOGY)      PRN Meds: sodium chloride, acetaminophen **OR** acetaminophen, albuterol, alteplase, diphenhydrAMINE, EPINEPHrine, famotidine (PEPCID) IV (ONCOLOGY), heparin lock flush, heparin lock flush, methylPREDNISolone sodium succinate, ondansetron **OR** ondansetron (ZOFRAN) IV, sodium chloride flush, sodium chloride flush  Physical Exam Vitals and nursing note reviewed.  Pulmonary:     Effort: Pulmonary effort is normal. No respiratory distress.  Skin:    General: Skin is warm and dry.  Neurological:     Mental Status: She is alert.  Psychiatric:        Behavior: Behavior is cooperative.             Vital Signs: BP (!) 111/46 (BP Location: Left Arm) Comment: RN  notified  Pulse (!) 105   Temp 97.8 F (36.6 C) (Oral)   Resp 16   Ht 5\' 4"  (1.626 m)   Wt 123.4 kg   SpO2 95%   BMI 46.69 kg/m  SpO2: SpO2: 95 % O2 Device: O2 Device: Room Air O2 Flow Rate:    Intake/output summary:   Intake/Output Summary (Last 24 hours) at 11/14/2019 1346 Last data filed at 11/14/2019 0555 Gross per 24 hour  Intake 360 ml  Output 3000 ml  Net -2640 ml   LBM: Last BM Date: 11/12/19 Baseline Weight: Weight: 101.2 kg Most recent weight: Weight: 123.4 kg       Palliative Assessment/Data: PPS 30% due to ambulation     Flowsheet Rows     Most Recent Value  Intake Tab  Referral Department  Hospitalist  Unit at Time of Referral  Cardiac/Telemetry Unit  Palliative Care Primary Diagnosis  Cancer  Date Notified  11/01/19  Palliative Care Type  New Palliative care  Reason for referral  Clarify Goals of Care  Date of Admission  10/29/19  Date first seen by Palliative Care  11/02/19  # of days Palliative referral response time  1 Day(s)  # of days IP prior to Palliative referral  3  Clinical Assessment  Palliative Performance Scale Score  20%  Psychosocial & Spiritual Assessment  Palliative Care Outcomes  Patient/Family meeting held?  Yes  Who was at the meeting?  spouse  Palliative Care Outcomes  Clarified goals of care, Counseled regarding hospice, Provided psychosocial or spiritual support, Changed CPR status      Patient Active Problem List   Diagnosis Date Noted  . Hepatic dysfunction   . Pressure injury of skin 11/06/2019  . Hypercalcemia of malignancy   . Hepatic encephalopathy ([place])   . Palliative care encounter   . Palliative care by specialist   . Goals of care, counseling/discussion   . DNR (do not resuscitate)   . Metastatic malignant neoplasm ([place]) 10/30/2019  . Hypercalcemia 10/29/2019  . AKI (acute kidney injury) ([place]) 10/29/2019  . Metastatic neoplastic disease ([place]) 10/29/2019  . Dehydration with hyponatremia 10/29/2019  .  GERD without esophagitis 10/29/2019  . Polypharmacy   . Uncontrolled type 2 diabetes mellitus with hyperglycemia, with long-term current use of insulin ([place])   . Weakness 06/19/2019  . Cushingoid side effect of steroids ([place])   . Generalized weakness 06/17/2019  . Cellulitis of leg, left   . Elevated LFTs   . Mass in neck   . Cervical radiculopathy at C8 01/31/2019  . Right arm numbness 01/30/2019  . Chronic migraine 12/06/2017  . Chronic intractable headache 10/23/2017  . 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 Recently getting extremely debilitated and unable to walk since last 2 weeks. In the emergency room, hemodynamically stable. Clinically dehydrated. CT scan abdomen pelvis shows extensive hepatic metastatic disease, omental caking and complex cyst left lower quadrant, multiple lymphadenopathy.  Palliative care was asked to get involved in the setting of likely metastatic cancer.  Assessment: Went to visit patient at bedside - husband was present.  Husband stated that [name] was doing well today. He verbalized understanding that plan is for her to discharge to SNF with rehab - reiterated that they preferred [facility] or [facility]. Per TOC note, both of these facilities are noted as preferences. Explained that TOC would reach out with further information once bed placement became available.   Discussed rehab and importance of continued physical therapy to increase strength. Depending on how patient does at rehab, will drive which setting is most appropriate for her to go once rehab has been completed.  Husband is in understanding of this. Recommended again for Palliative Care services to follow [name] as outpatient for continued support. Since patient may discharge soon, explained that we will start the referral process.   All questions and concerns were addressed. Encouraged to call with any further questions or concerns as they arise.   Recommendations/Plan:  Continue current medical treatment plan  Continue DNR  Outpatient Palliative Care referral requested from [facility] in anticipation of upcoming discharge  **Discharging MD - please write for outpatient palliative in Discharge Summary**  Goals of Care and Additional Recommendations:  Limitations on Scope of Treatment: Full Scope Treatment  Code Status:  DNR  Prognosis:   Unable to determine  Discharge Planning:  [place] for rehab with Palliative care service follow-up  Care plan was discussed with Dr. Neysa Bonito  Thank you for allowing the Palliative Medicine Team to assist in the care of  this patient.   Total Time 15 mins Prolonged Time Billed  no       Greater than 50%  of this time was spent counseling and coordinating care related to the above assessment and plan.  Amber M. Smith FNP-BC  [place], DNP, [facility] Palliative Medicine Team Team Phone # [phone]  Pager [phone]

## 2019-11-14 NOTE — Progress Notes (Signed)
PROGRESS NOTE    [name]    Code Status: DNR  [id number] DOB: January 03, 1946 DOA: 10/29/2019 LOS: 15 days  PCP: Leonides Sake, MD CC:  Chief Complaint  Patient presents with  . Abdominal Pain  . Abnormal Lab       Hospital Summary   This is a 74 year old female with history of type 2 diabetes, temporal arteritis, migraine, OSA and cervical radiculopathy who was admitted with waxing waning abdominal pain x3 months which had become worse 3 weeks prior to admission with generalized weakness and poor appetite as well as nausea who presented to her PCP and sent to the ED due to concerns of underlying malignancy.  In the ED she was found to have hepatic metastases, omental caking, complex cystic left lower quadrant mass and retroperitoneal adenopathy as well as hyponatremia, hypocalcemia and AKI.  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  Oncology and palliative care have been consulted.  She started on chemotherapy this hospitalization.    A & P   Principal Problem:   Metastatic neoplastic disease ([place]) Active Problems:   Uncontrolled type 2 diabetes mellitus with hyperglycemia, with long-term current use of insulin (HCC)   Hypercalcemia   AKI (acute kidney injury) ([place])   Dehydration with hyponatremia   GERD without esophagitis   Metastatic malignant neoplasm [facility])   Palliative care by specialist   Goals of care, counseling/discussion   DNR (do not resuscitate)   Hypercalcemia of malignancy   Hepatic encephalopathy ([place])   Palliative care encounter   Pressure injury of skin   Hepatic dysfunction   1. 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 Had liver biopsy on 5/14 b. PICC line placement on 5/24 c. Started chemotherapy with carboplatin and Taxol on  5/25 d. Appreciate oncology and palliative care recommendations  2. Acute encephalopathy, likely multifactorial: Metabolic and hepatic a. Had elevated ammonia level which improved initially with lactulose however she had multiple BMs and this was decreased b. Currently awake and alert but still oriented x1 c. Continue lactulose and titrate for 2-3 loose BMs daily  3. Transaminitis secondary to metastases, improved slightly a. Possibly from chemo as well? b. RUQ ultrasound: extensive hepatic metastatic disease, patent main portal vein   4. Anasarca, slightly improved a. Continue Lasix to 60 mg IV twice daily with addition of albumin given hypoalbuminemia b. Nutrition on board  5. NAGMA, improved a. Continue to monitor  6. Hypercalcemia, resolved a. Received Zometa x1  7. Hypophosphatemia a. Follow-up in a.m. b. Nutrition consult  8. Leukocytosis a. Trended up to 18, likely to underlying malignancy and illness b. No obvious signs of infection c. Continue to monitor for now   DVT prophylaxis: Heparin Family Communication: Husband at bedside yesterday Disposition Plan:  Status is: Inpatient  Remains inpatient appropriate because:Ongoing diagnostic testing needed not appropriate for outpatient work up and IV treatments appropriate due to intensity of illness or inability to take PO   Dispo: The patient is from: Home              Anticipated d/c is to: SNF              Anticipated d/c date is: 3 days              Patient currently is not medically stable to d/c.  Pressure injury documentation   Pressure Injury 11/06/19 Sacrum Stage 2 -  Partial thickness loss of dermis presenting as a shallow open injury with a red, pink wound bed without slough. (Active)  11/06/19 1500  Location: Sacrum  Location Orientation:   Staging: Stage 2 -  Partial thickness loss of dermis presenting as a shallow open injury with a red, pink wound bed without slough.  Wound  Description (Comments):   Present on Admission:    None  Consultants  Oncology Palliative care   Procedures  5/24 PICC line placement Started chemotherapy 5/25   Antibiotics   Anti-infectives (From admission, onward)   Start     Dose/Rate Route Frequency Ordered Stop   11/01/19 1615  fluconazole (DIFLUCAN) tablet 150 mg     150 mg Oral  Once 11/01/19 1609 11/01/19 1713        Subjective   Admits to nausea without vomiting. Otherwise no events overnight or other issues.   Objective   Vitals:   11/13/19 2041 11/13/19 2206 11/14/19 0550 11/14/19 1312  BP: 116/77  (!) 117/57 (!) 111/46  Pulse: 84 (!) 108 (!) 102 (!) 105  Resp: 18 18 18 16   Temp: 97.7 F (36.5 C)  98.1 F (36.7 C) 97.8 F (36.6 C)  TempSrc: Oral  Oral Oral  SpO2: 94% 96% 95% 95%  Weight:      Height:        Intake/Output Summary (Last 24 hours) at 11/14/2019 1650 Last data filed at 11/14/2019 0840 Gross per 24 hour  Intake 0 ml  Output 2600 ml  Net -2600 ml   Filed Weights   11/07/19 2041 11/08/19 2139 11/09/19 1609  Weight: 120.1 kg 120.1 kg 123.4 kg    Examination:  Physical Exam Vitals and nursing note reviewed.  Constitutional:      General: She is not in acute distress.    Appearance: She is ill-appearing.  HENT:     Head: Normocephalic.  Cardiovascular:     Rate and Rhythm: Normal rate.  Pulmonary:     Effort: Pulmonary effort is normal. No respiratory distress.  Abdominal:     Tenderness: There is no abdominal tenderness.     Comments: Firm abdomen  Musculoskeletal:     Comments: Diffuse 3+ pitting edema  Neurological:     Mental Status: She is alert. She is disoriented.     Data Reviewed: I have personally reviewed following labs and imaging studies  CBC: Recent Labs  Lab 11/08/19 1008 11/08/19 1008 11/10/19 0443 11/11/19 0543 11/12/19 0336 11/13/19 0500 11/14/19 0705  WBC 24.6*   < > 19.5* 20.4* 17.5* 14.8* 18.2*  NEUTROABS 20.0*  --   --  17.0* 14.4*  --    --   HGB 11.4*   < > 9.4* 9.3* 9.7* 10.4* 9.3*  HCT 37.0   < > 30.8* 30.4* 33.2* 34.9* 30.4*  MCV 100.3*   < > 100.7* 100.3* 104.1* 102.6* 104.1*  PLT PLATELET CLUMPS NOTED ON SMEAR, UNABLE TO ESTIMATE   < > 227 264 280 264 195   < > = values in this interval not displayed.   Basic Metabolic Panel: Recent Labs  Lab 11/08/19 1008 11/08/19 1008 11/10/19 0443 11/11/19 0543 11/12/19 0336 11/13/19 0500 11/14/19 0705  NA 139   < > 136 137 137 137 142  K 3.8   < > 4.4 4.1 4.5 4.8 4.4  CL 108   < > 107 106 107 103 106  CO2 18*   < >  20* 20* 21* 19* 22  GLUCOSE 133*   < > 103* 125* 117* 125* 117*  BUN 19   < > 24* 26* 28* 32* 37*  CREATININE 0.73   < > 0.60 0.64 0.56 0.90 0.67  CALCIUM 8.7*   < > 7.9* 8.3* 8.2* 7.8* 8.0*  MG 1.8  --  1.9 1.9 2.1 1.8  --   PHOS 1.5*  --  2.6 2.1* 2.1*  --   --    < > = values in this interval not displayed.   GFR: Estimated Creatinine Clearance: 82.5 mL/min (by C-G formula based on SCr of 0.67 mg/dL). Liver Function Tests: Recent Labs  Lab 11/10/19 0443 11/11/19 0543 11/12/19 0336 11/13/19 0500 11/14/19 0705  AST 199* 178* 212* 273* 202*  ALT 62* 57* 63* 87* 74*  ALKPHOS 1,158* 1,261* 1,440* 1,434* 1,325*  BILITOT 2.7* 2.1* 2.3* 2.6* 3.7*  PROT 4.7* 5.0* 5.1* 4.9* 4.7*  ALBUMIN 1.6* 2.0* 1.9* 1.8* 2.3*   No results for input(s): LIPASE, AMYLASE in the last 168 hours. Recent Labs  Lab 11/08/19 1142 11/10/19 0443 11/12/19 1130  AMMONIA 67* 54* 47*   Coagulation Profile: Recent Labs  Lab 11/10/19 0443  INR 1.2   Cardiac Enzymes: No results for input(s): CKTOTAL, CKMB, CKMBINDEX, TROPONINI in the last 168 hours. BNP (last 3 results) No results for input(s): PROBNP in the last 8760 hours. HbA1C: No results for input(s): HGBA1C in the last 72 hours. CBG: Recent Labs  Lab 11/08/19 1113  GLUCAP 122*   Lipid Profile: No results for input(s): CHOL, HDL, LDLCALC, TRIG, CHOLHDL, LDLDIRECT in the last 72 hours. Thyroid Function  Tests: No results for input(s): TSH, T4TOTAL, FREET4, T3FREE, THYROIDAB in the last 72 hours. Anemia Panel: No results for input(s): VITAMINB12, FOLATE, FERRITIN, TIBC, IRON, RETICCTPCT in the last 72 hours. Sepsis Labs: No results for input(s): PROCALCITON, LATICACIDVEN in the last 168 hours.  Recent Results (from the past 240 hour(s))  Culture, Urine     Status: Abnormal   Collection Time: 11/11/19  6:45 PM   Specimen: Urine, Random  Result Value Ref Range Status   Specimen Description   Final    URINE, RANDOM Performed at [facility] [address] [place], [place] [address]    Special Requests   Final    NONE Performed at [facility], [place] [address], [place], [place] [address]    Culture (A)  Final    >=100,000 COLONIES/mL PROTEUS VULGARIS >=100,000 COLONIES/mL ESCHERICHIA COLI    Report Status 11/14/2019 FINAL  Final   Organism ID, Bacteria PROTEUS VULGARIS (A)  Final   Organism ID, Bacteria ESCHERICHIA COLI (A)  Final      Susceptibility   Escherichia coli - MIC*    AMPICILLIN 4 SENSITIVE Sensitive     CEFAZOLIN <=4 SENSITIVE Sensitive     CEFTRIAXONE <=1 SENSITIVE Sensitive     CIPROFLOXACIN <=0.25 SENSITIVE Sensitive     GENTAMICIN <=1 SENSITIVE Sensitive     IMIPENEM <=0.25 SENSITIVE Sensitive     NITROFURANTOIN <=16 SENSITIVE Sensitive     TRIMETH/SULFA <=20 SENSITIVE Sensitive     AMPICILLIN/SULBACTAM <=2 SENSITIVE Sensitive     PIP/TAZO <=4 SENSITIVE Sensitive     * >=100,000 COLONIES/mL ESCHERICHIA COLI   Proteus vulgaris - MIC*    AMPICILLIN >=32 RESISTANT Resistant     CEFAZOLIN >=64 RESISTANT Resistant     CEFTRIAXONE <=1 SENSITIVE Sensitive     CIPROFLOXACIN <=0.25 SENSITIVE Sensitive  GENTAMICIN <=1 SENSITIVE Sensitive     IMIPENEM 2 SENSITIVE Sensitive     NITROFURANTOIN RESISTANT Resistant     TRIMETH/SULFA <=20 SENSITIVE Sensitive     AMPICILLIN/SULBACTAM 8 SENSITIVE Sensitive     PIP/TAZO <=4  SENSITIVE Sensitive     * >=100,000 COLONIES/mL PROTEUS VULGARIS         Radiology Studies: US Abdomen Limited RUQ  Result Date: 11/14/2019 CLINICAL DATA:  74 year old female with transaminitis. History of breast cancer. EXAM: ULTRASOUND ABDOMEN LIMITED RIGHT UPPER QUADRANT COMPARISON:  CT of the abdomen pelvis dated 10/29/2019. FINDINGS: Gallbladder: Cholecystectomy. Common bile duct: Poorly visualized. Liver: The liver demonstrates a heterogeneous echotexture with innumerable hypoechoic lesions consistent with metastatic disease. 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 Portal vein is patent on color Doppler imaging with normal direction of blood flow towards the liver. Other: There is a small perihepatic ascites. Moderate right renal atrophy. IMPRESSION: 1. Extensive hepatic metastatic disease. 2. Small perihepatic ascites. 3. Patent main portal vein with hepatopetal flow. Electronically Signed   By: Anner Crete M.D.   On: 11/14/2019 16:37        Scheduled Meds: . Chlorhexidine Gluconate Cloth  6 each Topical Daily  . citalopram  10 mg Oral Daily  . feeding supplement (ENSURE ENLIVE)  237 mL Oral TID BM  . fluticasone  1 spray Each Nare BID  . lactulose  30 g Oral Daily  . nystatin  5 mL Mouth/Throat QID  . nystatin   Topical TID  . pantoprazole  40 mg Oral Q supper   Continuous Infusions: . sodium chloride    . famotidine (PEPCID) IV (ONCOLOGY)       Time spent: 30 minutes with over 50% of the time coordinating the patient's care    Harold Hedge, DO [facility] Pager [phone]  Call night coverage person covering after 7pm

## 2019-11-14 NOTE — Progress Notes (Signed)
Patient placed on CPAP per patient request.

## 2019-11-14 NOTE — Care Management Important Message (Signed)
Important Message  Patient Details IM Letter given to Case Manager to present to the Patient Name: [name] MRN: [id number] Date of Birth: Dec 13, 1945   Medicare Important Message Given:  Yes     [name] 11/14/2019, 4:36 PM

## 2019-11-14 NOTE — TOC Progression Note (Signed)
Transition of Care [facility]) - Progression Note    Patient Details  Name: [name] MRN: [id number] Date of Birth: 1946/05/26  [facility] CM/SW Contact  Shade Flood, LCSW Phone Number: 11/14/2019, 4:11 PM  Clinical Narrative:     TOC following. Pt status discussed with attending in Progression this AM. Per MD, awaiting Oncology for dc timeframe. Pt sent out for SNF. Pt's bed offers from her preferred facilities are still pending. Updated pt's husband who reiterates preferences. Voicemail messages left for admissions at each facility. No return call received as of yet.   Insurance auth for SNF is also still pending.  Pt will need outpatient palliative to follow at dc as well.  TOC will follow.  Expected Discharge Plan: Skilled Nursing Facility Barriers to Discharge: Continued Medical Work up  Expected Discharge Plan and Services Expected Discharge Plan: [place] arrangements for the past 2 months: Single Family Home                                       Social Determinants of Health (SDOH) Interventions    Readmission Risk Interventions Readmission Risk Prevention Plan 11/10/2019  Transportation Screening Complete  PCP or Specialist Appt within 3-5 Days Complete  HRI or [place] Complete  Social Work Consult for [place] Planning/Counseling Complete  Palliative Care Screening Complete  Medication Review [employer]) Complete  Some recent data might be hidden

## 2019-11-15 LAB — CBC
HCT: 28.7 % — ABNORMAL LOW (ref 36.0–46.0)
Hemoglobin: 8.4 g/dL — ABNORMAL LOW (ref 12.0–15.0)
MCH: 30.4 pg (ref 26.0–34.0)
MCHC: 29.3 g/dL — ABNORMAL LOW (ref 30.0–36.0)
MCV: 104 fL — ABNORMAL HIGH (ref 80.0–100.0)
Platelets: 149 10*3/uL — ABNORMAL LOW (ref 150–400)
RBC: 2.76 MIL/uL — ABNORMAL LOW (ref 3.87–5.11)
RDW: 21.9 % — ABNORMAL HIGH (ref 11.5–15.5)
WBC: 17.4 10*3/uL — ABNORMAL HIGH (ref 4.0–10.5)
nRBC: 0.1 % (ref 0.0–0.2)

## 2019-11-15 LAB — COMPREHENSIVE METABOLIC PANEL
ALT: 64 U/L — ABNORMAL HIGH (ref 0–44)
AST: 188 U/L — ABNORMAL HIGH (ref 15–41)
Albumin: 2.5 g/dL — ABNORMAL LOW (ref 3.5–5.0)
Alkaline Phosphatase: 1112 U/L — ABNORMAL HIGH (ref 38–126)
Anion gap: 11 (ref 5–15)
BUN: 39 mg/dL — ABNORMAL HIGH (ref 8–23)
CO2: 23 mmol/L (ref 22–32)
Calcium: 7.9 mg/dL — ABNORMAL LOW (ref 8.9–10.3)
Chloride: 105 mmol/L (ref 98–111)
Creatinine, Ser: 0.68 mg/dL (ref 0.44–1.00)
GFR calc Af Amer: >60 mL/min (ref >60)
GFR calc non Af Amer: >60 mL/min (ref >60)
Glucose, Bld: 106 mg/dL — ABNORMAL HIGH (ref 70–99)
Potassium: 3.9 mmol/L (ref 3.5–5.1)
Sodium: 139 mmol/L (ref 135–145)
Total Bilirubin: 4.9 mg/dL — ABNORMAL HIGH (ref 0.3–1.2)
Total Protein: 4.8 g/dL — ABNORMAL LOW (ref 6.5–8.1)

## 2019-11-15 LAB — MAGNESIUM: Magnesium: 1.9 mg/dL (ref 1.7–2.4)

## 2019-11-15 NOTE — Progress Notes (Signed)
HEMATOLOGY-ONCOLOGY PROGRESS NOTE  SUBJECTIVE: She reports mild nausea and abdominal discomfort this morning.  PHYSICAL EXAMINATION:  Vitals:   11/15/19 0642 11/15/19 1319  BP: 106/61 (!) 102/56  Pulse: (!) 104 84  Resp: 18 16  Temp: 98.1 F (36.7 C) 97.9 F (36.6 C)  SpO2: 95% 97%   Filed Weights   11/07/19 2041 11/08/19 2139 11/09/19 1609  Weight: 264 lb 12.4 oz (120.1 kg) 264 lb 12.6 oz (120.1 kg) 272 lb (123.4 kg)    Intake/Output from previous day: 05/28 0701 - 05/29 0700 In: 19 [IV Piggyback:19] Out: 2300 [Urine:2300]  Lungs: Clear anteriorly Cardiac: Regular rate and rhythm ABDOMEN: Soft, liver edge palpable in the upper abdomen, mild right upper abdomen tenderness  NEURO: Alert, not oriented to year.  Oriented to diagnosis Vascular: Pitting edema to the bilateral upper and lower extremities-partially improved  PICC without erythema or drainage  LABORATORY DATA:  I have reviewed the data as listed CMP Latest Ref Rng & Units 11/15/2019 11/14/2019 11/13/2019  Glucose 70 - 99 mg/dL 106(H) 117(H) 125(H)  BUN 8 - 23 mg/dL 39(H) 37(H) 32(H)  Creatinine 0.44 - 1.00 mg/dL 0.68 0.67 0.90  Sodium 135 - 145 mmol/L 139 142 137  Potassium 3.5 - 5.1 mmol/L 3.9 4.4 4.8  Chloride 98 - 111 mmol/L 105 106 103  CO2 22 - 32 mmol/L 23 22 19(L)  Calcium 8.9 - 10.3 mg/dL 7.9(L) 8.0(L) 7.8(L)  Total Protein 6.5 - 8.1 g/dL 4.8(L) 4.7(L) 4.9(L)  Total Bilirubin 0.3 - 1.2 mg/dL 4.9(H) 3.7(H) 2.6(H)  Alkaline Phos 38 - 126 U/L 1,112(H) 1,325(H) 1,434(H)  AST 15 - 41 U/L 188(H) 202(H) 273(H)  ALT 0 - 44 U/L 64(H) 74(H) 87(H)    Lab Results  Component Value Date   WBC 17.4 (H) 11/15/2019   HGB 8.4 (L) 11/15/2019   HCT 28.7 (L) 11/15/2019   MCV 104.0 (H) 11/15/2019   PLT 149 (L) 11/15/2019   NEUTROABS 14.4 (H) 11/12/2019    CT CHEST WO CONTRAST  Result Date: 10/30/2019 CLINICAL DATA:  Metastatic malignancy EXAM: CT CHEST WITHOUT CONTRAST TECHNIQUE: Multidetector CT imaging of the  chest was performed following the standard protocol without IV contrast. COMPARISON:  10/29/2019, 06/18/2019 FINDINGS: Cardiovascular: No pericardial effusion. Normal caliber of the thoracic aorta. Minimal atherosclerosis. Mediastinum/Nodes: Numerous enlarged mediastinal lymph nodes are identified. Index lymph node in the right paratracheal region measures 21 x 21 mm, previous having measured 27 x 22 mm. Increased number of enlarged lymph nodes throughout the mediastinum compared to prior neck CT. The trachea, esophagus, and thyroid are unremarkable. Lungs/Pleura: There is a small spiculated sub solid nodule in the right upper lobe image 35, measuring 5 mm. No other pulmonary nodules or masses. Mild upper lobe predominant emphysema. Hypoventilatory changes at the lung bases. No large effusion or pneumothorax. Upper Abdomen: Please refer to recent abdominal CT describing numerous hepatic metastases as well as omental caking and trace ascites. Musculoskeletal: There are no acute or destructive bony lesions. Reconstructed images demonstrate no additional findings. IMPRESSION: 1. Nonspecific sub solid 5 mm right upper lobe pulmonary nodule. Neoplasm cannot be excluded in light of findings elsewhere within the chest and abdomen. 2. Extensive mediastinal lymphadenopathy worrisome for nodal metastases. 3. Aortic Atherosclerosis (ICD10-I70.0) and Emphysema (ICD10-J43.9). 4. Please refer to recent abdominal CT describing diffuse hepatic metastases. Electronically Signed   By: Randa Ngo M.D.   On: 10/30/2019 21:56   MR BRAIN W WO CONTRAST  Result Date: 10/31/2019 CLINICAL DATA:  Weakness, metastases  EXAM: MRI HEAD WITHOUT AND WITH CONTRAST TECHNIQUE: Multiplanar, multiecho pulse sequences of the brain and surrounding structures were obtained without and with intravenous contrast. CONTRAST:  37m GADAVIST GADOBUTROL 1 MMOL/ML IV SOLN COMPARISON:  09/30/2018 FINDINGS: Motion artifact is present Brain: There is no acute  infarction or intracranial hemorrhage. There is no intracranial mass, mass effect, or edema. There is no hydrocephalus or extra-axial fluid collection. Ventricles and sulci are stable in size and configuration. Patchy T2 hyperintensity in the supratentorial and pontine white matter is nonspecific but probably reflects mild chronic microvascular ischemic changes. No abnormal enhancement. Vascular: Major vessel flow voids at the skull base are preserved. Skull and upper cervical spine: Normal marrow signal is preserved. There is susceptibility artifact related to partially imaged cervical spine fusion. Sinuses/Orbits: Paranasal sinuses are aerated. Orbits are unremarkable. Other: Sella is unremarkable.  Mastoid air cells are clear. IMPRESSION: Suboptimal evaluation due to motion artifact. No evidence of intracranial metastatic disease. No acute infarction or hemorrhage. Electronically Signed   By: PMacy MisM.D.   On: 10/31/2019 14:24   CT ABDOMEN PELVIS W CONTRAST  Result Date: 10/29/2019 CLINICAL DATA:  Hyperkalemia,, liver disease, emesis, weight loss EXAM: CT ABDOMEN AND PELVIS WITH CONTRAST TECHNIQUE: Multidetector CT imaging of the abdomen and pelvis was performed using the standard protocol following bolus administration of intravenous contrast. CONTRAST:  740mOMNIPAQUE IOHEXOL 300 MG/ML  SOLN COMPARISON:  06/17/2019, 01/05/2006 FINDINGS: Lower chest: No acute pleural or parenchymal lung disease. Hepatobiliary: There are innumerable masses replacing the majority of the liver parenchyma consistent with diffuse metastatic disease. The gallbladder is surgically absent. Pancreas: Unremarkable. No pancreatic ductal dilatation or surrounding inflammatory changes. Spleen: Normal in size without focal abnormality. Adrenals/Urinary Tract: Adrenal glands are unremarkable. Kidneys are normal, without renal calculi, focal lesion, or hydronephrosis. Bladder is unremarkable. Stomach/Bowel: No bowel obstruction or  ileus. No bowel wall thickening or inflammatory changes. Vascular/Lymphatic: There is pathologic adenopathy within the retroperitoneum. Largest pathologic lymph node in the left external iliac chain reference image 77 measures 3.5 x 3.1 cm. A second index lymph node at the right iliac bifurcation measures 2.6 x 2.2 cm reference image 62. There is minimal atherosclerosis of the aorta. Reproductive: Uterus is surgically absent. I do not see any adnexal lesions. Other: There is soft tissue fat stranding throughout the ventral omentum consistent with omental caking. Complex cystic mass within the left lower quadrant along the anterior abdominal wall measuring 5.7 x 7.2 cm is most consistent with a necrotic mass. Mural thickening along the anterior and lateral margin of the mass. There is trace free fluid within the upper abdomen surrounding the liver and spleen. No free intra-abdominal gas. Musculoskeletal: There are no acute or destructive bony lesions. Reconstructed images demonstrate no additional findings. IMPRESSION: 1. Diffuse hepatic metastases. 2. Omental caking. Complex cystic mass left lower quadrant consistent with necrotic lesion. 3. Pathologic retroperitoneal lymphadenopathy, most pronounced within the iliac distributions left greater than right. 4. Trace ascites. Electronically Signed   By: MiRanda Ngo.D.   On: 10/29/2019 22:15   [place]OPSY (LIVER)  Result Date: 10/31/2019 INDICATION: [age]ear old female with multifocal hepatic lesions concerning for metastatic disease. She presents for ultrasound-guided biopsy of the same. EXAM: ULTRASOUND BIOPSY CORE LIVER MEDICATIONS: None. ANESTHESIA/SEDATION: Moderate (conscious) sedation was employed during this procedure. A total of Versed 1 mg and Fentanyl 50 mcg was administered intravenously. Moderate Sedation Time: 13 minutes. The patient's level of consciousness and vital signs were monitored continuously by radiology nursing throughout the  procedure  under my direct supervision. FLUOROSCOPY TIME:  None. COMPLICATIONS: None immediate. PROCEDURE: Informed written consent was obtained from the patient after a thorough discussion of the procedural risks, benefits and alternatives. All questions were addressed. A timeout was performed prior to the initiation of the procedure. The liver was interrogated with ultrasound. There are innumerable hypoechoic solid lesions scattered throughout the liver. A suitable lesion in the left hepatic lobe was identified. The overlying skin was sterilely prepped and draped in the standard fashion. Local anesthesia was attained by infiltration with 1% lidocaine. A small dermatotomy was made. Under real-time ultrasound guidance, multiple 18 gauge core biopsies were obtained using the bio Pince automated biopsy device. Biopsies were performed coaxially through a 17 gauge introducer needle. As the introducer needle was withdrawn, the biopsy tract was embolized with a Gel-Foam slurry. Post ultrasound imaging demonstrates no evidence of immediate complication. IMPRESSION: Technically successful ultrasound-guided core biopsy of liver lesion. Electronically Signed   By: Jacqulynn Cadet M.D.   On: 10/31/2019 12:09   DG CHEST PORT 1 VIEW  Result Date: 11/05/2019 CLINICAL DATA:  Acute respiratory distress EXAM: PORTABLE CHEST 1 VIEW COMPARISON:  06/17/2019 FINDINGS: Single frontal view of the chest demonstrates an enlarged cardiac silhouette. Lung volumes are diminished. Mild increased interstitial prominence with central vascular congestion consistent with mild fluid overload. Small right pleural effusion. No pneumothorax. IMPRESSION: 1. Findings suggesting mild interstitial edema. Electronically Signed   By: Randa Ngo M.D.   On: 11/05/2019 22:51   DG Swallowing Func-Speech Pathology  Result Date: 11/06/2019 Objective Swallowing Evaluation: Type of Study: MBS-Modified Barium Swallow Study  Patient Details Name: [name]  MRN: [id number] Date of Birth: September 06, 1945 Today's Date: 11/06/2019 Time: SLP Start Time (ACUTE ONLY): 70 -SLP Stop Time (ACUTE ONLY): 1300 SLP Time Calculation (min) (ACUTE ONLY): 30 min Past Medical History: Past Medical History: Diagnosis Date . ADD (attention deficit disorder)  . Anxiety  . Arthritis   os . Breast cancer ([place])  . Diabetes mellitus without complication ([place])  . GERD (gastroesophageal reflux disease)   barretts . Headache  . High cholesterol  . Neck pain  . Obesity  . Personal history of radiation therapy 2005 . Sleep apnea   cpap   last sleep study> 3 yrs . Swelling of both ankles  . Weakness 06/2019 Past Surgical History: Past Surgical History: Procedure Laterality Date . ABDOMINAL HYSTERECTOMY   . ANTERIOR CERVICAL DECOMP/DISCECTOMY FUSION N/A 02/15/2016  Procedure: Cervical three four-Cervical five-six, Cervical six-seven Anterior cervical decompression/diskectomy/fusion;  Surgeon: Kristeen Miss, MD;  Location: [facility];  Service: Neurosurgery;  Laterality: N/A; . bicept surgery   . BREAST LUMPECTOMY   . CHOLECYSTECTOMY   . JOINT REPLACEMENT   . PAROTID GLAND TUMOR EXCISION   . TONSILLECTOMY   . TOTAL KNEE ARTHROPLASTY  07/19/2012  Procedure: TOTAL KNEE ARTHROPLASTY;  Surgeon: Yvette Rack., MD;  Location: [facility];  Service: Orthopedics;  Laterality: Right;  right total knee arthroplasty . TUBAL LIGATION   HPI: Pt is a 74 year old female admitted with medical decline and abnormal labs, found to have signs of malignant breast cancer with hepatic metastases. Admitted with subacute abdominal pain waxing waning over 3 months more acutely 3 weeks ago developed worsening weakness poor appetite nausea.  Has had difficulty swallowing with some question of a neck mass and thrust. CT shows Extensive mediastinal lymphadenopathy worrisome for nodal metastases.  No data recorded Assessment / Plan / Recommendation CHL IP CLINICAL IMPRESSIONS 11/06/2019 Clinical  Impression  Pt demonstrates mild oropharyngeal  dysphagia, appearing primarily due to decreased awareness and mentation. Pt kept her eyes closed during session but followed commands as needed, When taking sips, there were instances of brief oral holding and also slight premature spillage. Airway closure was not complete at times during the swallow leading to instances of flash frank penetration. Typically no cough was elicited during these events, but as study progressed pt coughed more and mroe often, not appearing to be related to pharyngeal impairment. Esophageal sweep showed some distal residue and pill needed several bolus to fully clear. Pt is capable of regular solids and thin liquids but she should only eat and drink when fully alert. Will f/u for tolerance of diet and further modifications as needed.  SLP Visit Diagnosis Dysphagia, unspecified (R13.10) Attention and concentration deficit following -- Frontal lobe and executive function deficit following -- Impact on safety and function Mild aspiration risk;Risk for inadequate nutrition/hydration   CHL IP TREATMENT RECOMMENDATION 11/06/2019 Treatment Recommendations Therapy as outlined in treatment plan below   No flowsheet data found. CHL IP DIET RECOMMENDATION 11/06/2019 SLP Diet Recommendations -- Liquid Administration via Cup;Straw Medication Administration Whole meds with liquid Compensations -- Postural Changes Seated upright at 90 degrees;Remain semi-upright after after feeds/meals (Comment)   No flowsheet data found.  CHL IP FOLLOW UP RECOMMENDATIONS 11/06/2019 Follow up Recommendations 24 hour supervision/assistance   CHL IP FREQUENCY AND DURATION 11/06/2019 Speech Therapy Frequency (ACUTE ONLY) min 1 x/week Treatment Duration 1 week      CHL IP ORAL PHASE 11/06/2019 Oral Phase Impaired Oral - Pudding Teaspoon -- Oral - Pudding Cup -- Oral - Honey Teaspoon -- Oral - Honey Cup -- Oral - Nectar Teaspoon -- Oral - Nectar Cup -- Oral - Nectar Straw -- Oral - Thin Teaspoon Premature spillage;Decreased  bolus cohesion Oral - Thin Cup -- Oral - Thin Straw Delayed oral transit Oral - Puree Delayed oral transit Oral - Mech Soft -- Oral - Regular Delayed oral transit Oral - Multi-Consistency -- Oral - Pill -- Oral Phase - Comment --  CHL IP PHARYNGEAL PHASE 11/06/2019 Pharyngeal Phase Impaired Pharyngeal- Pudding Teaspoon -- Pharyngeal -- Pharyngeal- Pudding Cup -- Pharyngeal -- Pharyngeal- Honey Teaspoon -- Pharyngeal -- Pharyngeal- Honey Cup -- Pharyngeal -- Pharyngeal- Nectar Teaspoon -- Pharyngeal -- Pharyngeal- Nectar Cup -- Pharyngeal -- Pharyngeal- Nectar Straw -- Pharyngeal -- Pharyngeal- Thin Teaspoon Penetration/Aspiration before swallow;Penetration/Aspiration during swallow Pharyngeal Material enters airway, remains ABOVE vocal cords then ejected out Pharyngeal- Thin Cup -- Pharyngeal -- Pharyngeal- Thin Straw Penetration/Aspiration before swallow;Penetration/Aspiration during swallow Pharyngeal Material enters airway, CONTACTS cords and then ejected out;Material enters airway, remains ABOVE vocal cords then ejected out;Material does not enter airway Pharyngeal- Puree WFL Pharyngeal -- Pharyngeal- Mechanical Soft -- Pharyngeal -- Pharyngeal- Regular WFL Pharyngeal -- Pharyngeal- Multi-consistency -- Pharyngeal -- Pharyngeal- Pill WFL Pharyngeal -- Pharyngeal Comment --  No flowsheet data found. DeBlois, Katherene Ponto 11/06/2019, 2:48 PM              VAS [place] UPPER EXTREMITY VENOUS DUPLEX  Result Date: 11/07/2019 UPPER VENOUS STUDY  Indications: Edema Limitations: Body habitus, poor ultrasound/tissue interface and patient unable to cooperate. Patient position. Comparison Study: No prior study Performing Technologist: Maudry Mayhew MHA, RDMS, RVT, RDCS  Examination Guidelines: A complete evaluation includes B-mode imaging, spectral Doppler, color Doppler, and power Doppler as needed of all accessible portions of each vessel. Bilateral testing is considered an integral part of a complete examination.  Limited examinations for reoccurring indications may  be performed as noted.  Right Findings: +----------+------------+---------+-----------+----------+--------------+ RIGHT     CompressiblePhasicitySpontaneousProperties   Summary     +----------+------------+---------+-----------+----------+--------------+ Subclavian                                          Not visualized +----------+------------+---------+-----------+----------+--------------+  Left Findings: +----------+------------+---------+-----------+----------+--------------+ LEFT      CompressiblePhasicitySpontaneousProperties   Summary     +----------+------------+---------+-----------+----------+--------------+ IJV           Full       Yes       Yes                             +----------+------------+---------+-----------+----------+--------------+ Subclavian    Full       Yes       Yes                             +----------+------------+---------+-----------+----------+--------------+ Axillary      Full       Yes       Yes                             +----------+------------+---------+-----------+----------+--------------+ Brachial      Full       Yes       Yes                             +----------+------------+---------+-----------+----------+--------------+ Radial        Full                                                 +----------+------------+---------+-----------+----------+--------------+ Ulnar                                               Not visualized +----------+------------+---------+-----------+----------+--------------+ Cephalic      Full                                                 +----------+------------+---------+-----------+----------+--------------+ Basilic                                             Not visualized +----------+------------+---------+-----------+----------+--------------+  Summary:  Left: No evidence of deep vein thrombosis in the  upper extremity. No evidence of superficial vein thrombosis in the upper extremity. This study was limited due to technical limitations listed above.  *See table(s) above for measurements and observations.  Diagnosing physician: Servando Snare MD Electronically signed by Servando Snare MD on 11/07/2019 at 3:03:30 PM.    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  EXAM: ULTRASOUND AND FLUOROSCOPIC GUIDED PICC LINE INSERTION MEDICATIONS: 1% lidocaine to skin and subcutaneous tissue ANESTHESIA/SEDATION: None FLUOROSCOPY TIME:  Fluoroscopy Time:  42 seconds (7 mGy). COMPLICATIONS: None immediate. TECHNIQUE: The procedure, risks, benefits, and alternatives were explained to the patient and informed written consent was obtained. A timeout was performed prior to the initiation of the procedure. The right upper extremity was prepped with chlorhexidine in a sterile fashion, and a sterile drape was applied covering the operative field. Maximum barrier sterile technique with sterile gowns and gloves were used for the procedure. A timeout was performed prior to the initiation of the procedure. Local anesthesia was provided with 1% lidocaine. Under direct ultrasound guidance, the right brachial vein was accessed with a micropuncture kit after the overlying soft tissues were anesthetized with 1% lidocaine. An ultrasound image was saved for documentation purposes. A guidewire was advanced to the level of the superior caval-atrial junction for measurement purposes and the PICC line was cut to length. A peel-away sheath was placed and a 39 cm, 5 [place], dual lumen was inserted to level of the superior caval-atrial junction. A post procedure spot fluoroscopic was obtained. The catheter easily aspirated and flushed and was sutured in place. A dressing was  placed. The patient tolerated the procedure well without immediate post procedural complication. FINDINGS: After catheter placement, the tip lies within the superior cavoatrial junction the catheter aspirates and flushes normally and is ready for immediate use. IMPRESSION: Successful ultrasound and fluoroscopic guided placement of a right brachial vein approach, 39 cm, 5 French, dual lumen PICC with tip at the superior caval-atrial junction. The PICC line is ready for immediate use. Read by: Rowe Robert, PA-C Electronically Signed   By: Aletta Edouard M.D.   On: 11/10/2019 13:39   [place] EKG SITE RITE  Result Date: 11/07/2019 If Site Rite image not attached, placement could not be confirmed due to current cardiac rhythm.  US Abdomen Limited RUQ  Result Date: 11/14/2019 CLINICAL DATA:  74 year old female with transaminitis. History of breast cancer. EXAM: ULTRASOUND ABDOMEN LIMITED RIGHT UPPER QUADRANT COMPARISON:  CT of the abdomen pelvis dated 10/29/2019. FINDINGS: Gallbladder: Cholecystectomy. Common bile duct: Poorly visualized. Liver: The liver demonstrates a heterogeneous echotexture with innumerable hypoechoic lesions consistent with metastatic disease. 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 Portal vein is patent on color Doppler imaging with normal direction of blood flow towards the liver. Other: There is a small perihepatic ascites. Moderate right renal atrophy. IMPRESSION: 1. Extensive hepatic metastatic disease. 2. Small perihepatic ascites. 3. Patent main portal vein with hepatopetal flow. Electronically Signed   By: Anner Crete M.D.   On: 11/14/2019 16:37    ASSESSMENT AND PLAN: 1.    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  Primary peritoneal carcinoma favored given history of  hysterectomy with negative pathology.  -10/29/2019 CEA 4.8, CA 19.9 441, AFP 2.1   -10/30/2019 CA 27.29 6728  -11/06/2019 CA-125 14,072  Cycle 1 Taxol/carboplatin 11/11/2019 2.  Elevated total bilirubin and LFTs 3.  AKI secondary to dehydration 4.  Hypercalcemia-status post Zometa and calcitonin, resolved 5.  Deconditioning 6.  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  Diabetes mellitus 8.  Temporal arteritis, status post treatment 9.  Migraine headaches 10.  Leg weakness-steroid myopathy?,  Paraneoplastic syndrome?  [name] is now at day 5  following cycle 1 Taxol/carboplatin.  She tolerated the chemotherapy without significant acute toxicity.  The platelet count is mildly low today.  She continues diuresis.  She hopes to return home if she can become ambulatory.  Recommendations: 1.  Continue diuresis per the medical service 2.  Continue physical therapy, out of bed as tolerated, consider evaluation for inpatient rehabilitation stay 3.  Check CBC 11/17/2019 4.  Please call oncology as needed on 11/16/2019   LOS: 16 days   Betsy Coder 11/15/19

## 2019-11-15 NOTE — Progress Notes (Signed)
PROGRESS NOTE    [name]    Code Status: DNR  YV:[id number] DOB: 12/25/1945 DOA: 10/29/2019 LOS: 16 days  PCP: Leonides Sake, MD CC:  Chief Complaint  Patient presents with  . Abdominal Pain  . Abnormal Lab       Hospital Summary   This is a 74 year old female with history of type 2 diabetes, temporal arteritis, migraine, OSA and cervical radiculopathy who was admitted with waxing waning abdominal pain x3 months which had become worse 3 weeks prior to admission with generalized weakness and poor appetite as well as nausea who presented to her PCP and sent to the ED due to concerns of underlying malignancy.  In the ED she was found to have hepatic metastases, omental caking, complex cystic left lower quadrant mass and retroperitoneal adenopathy as well as hyponatremia, hypocalcemia and AKI.  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  Oncology and palliative care have been consulted.  She started on chemotherapy this hospitalization.    A & P   Principal Problem:   Metastatic neoplastic disease ([place]) Active Problems:   Uncontrolled type 2 diabetes mellitus with hyperglycemia, with long-term current use of insulin (HCC)   Hypercalcemia   AKI (acute kidney injury) ([place])   Dehydration with hyponatremia   GERD without esophagitis   Metastatic malignant neoplasm [facility])   Palliative care by specialist   Goals of care, counseling/discussion   DNR (do not resuscitate)   Hypercalcemia of malignancy   Hepatic encephalopathy ([place])   Palliative care encounter   Pressure injury of skin   Hepatic dysfunction   1. 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 Had liver biopsy on 5/14 b. PICC line placement on 5/24 c. Started chemotherapy with carboplatin and Taxol on  5/25 without significant toxicity d. Appreciate oncology and palliative care recommendations  2. Acute encephalopathy, likely multifactorial: Metabolic and hepatic a. Had elevated ammonia level which improved initially with lactulose however she had multiple BMs and this was decreased b. Continue lactulose and titrate for 2-3 loose BMs daily  3. Transaminitis secondary to metastases a. Possibly from chemo as well? b. RUQ ultrasound: extensive hepatic metastatic disease, patent main portal vein  c. Overall improved with the exception of increasing T bili: 4.9  4. Anasarca, slightly improved a. Good output with diuresis  b. Continue Lasix to 60 mg IV twice daily c. Unable to get accurate weights d. Nutrition on board  5. NAGMA, resolved  6. Nausea a. zofran PRN  7. Hypercalcemia, resolved a. Received Zometa x1  8. Hypophosphatemia a. Not obtained today, will order for tomorrow b. Nutrition consulted  9. Reactive Leukocytosis from underlying malignancy and illness, improving a. Continue to monitor for now 10. Leg weakness a. Concern for steroid myopathy vs. Paraneoplastic syndrome per oncology b. Off steroids c. PT on board recommending snf d. Candidate for CIR?   DVT prophylaxis: Heparin Family Communication: no family at bedside Disposition Plan:  Status is: Inpatient  Remains inpatient appropriate because:Ongoing diagnostic testing needed not appropriate for outpatient work up and IV treatments appropriate due to intensity of illness or inability to take PO   Dispo: The patient is from: Home              Anticipated d/c is to: SNF  Anticipated d/c date is: 3 days              Patient currently is not medically stable to d/c.           Pressure injury documentation   Pressure Injury 11/06/19 Sacrum Stage 2 -  Partial thickness loss of dermis presenting as a shallow open injury with a red, pink wound bed without slough. (Active)  11/06/19 1500   Location: Sacrum  Location Orientation:   Staging: Stage 2 -  Partial thickness loss of dermis presenting as a shallow open injury with a red, pink wound bed without slough.  Wound Description (Comments):   Present on Admission:    None  Consultants  Oncology Palliative care   Procedures  5/24 PICC line placement Started chemotherapy 5/25   Antibiotics   Anti-infectives (From admission, onward)   Start     Dose/Rate Route Frequency Ordered Stop   11/01/19 1615  fluconazole (DIFLUCAN) tablet 150 mg     150 mg Oral  Once 11/01/19 1609 11/01/19 1713        Subjective   Still with nausea without vomiting or appetite. No overnight events  Objective   Vitals:   11/14/19 1312 11/14/19 1948 11/15/19 0642 11/15/19 1319  BP: (!) 111/46 114/67 106/61 (!) 102/56  Pulse: (!) 105 (!) 109 (!) 104 84  Resp: 16 18 18 16   Temp: 97.8 F (36.6 C) 98.6 F (37 C) 98.1 F (36.7 C) 97.9 F (36.6 C)  TempSrc: Oral Oral Oral Oral  SpO2: 95% 96% 95% 97%  Weight:      Height:        Intake/Output Summary (Last 24 hours) at 11/15/2019 1544 Last data filed at 11/15/2019 1251 Gross per 24 hour  Intake 139.02 ml  Output 3850 ml  Net -3710.98 ml   Filed Weights   11/07/19 2041 11/08/19 2139 11/09/19 1609  Weight: 120.1 kg 120.1 kg 123.4 kg    Examination:  Physical Exam Vitals and nursing note reviewed.  Constitutional:      Appearance: She is obese.  HENT:     Head: Normocephalic.  Cardiovascular:     Rate and Rhythm: Normal rate.  Pulmonary:     Effort: Pulmonary effort is normal.  Abdominal:     General: Abdomen is flat.     Tenderness: There is no abdominal tenderness.     Comments: Firm abdomen  Skin:    Coloration: Skin is not cyanotic or jaundiced.  Neurological:     Mental Status: She is alert.     Data Reviewed: I have personally reviewed following labs and imaging studies  CBC: Recent Labs  Lab 11/11/19 0543 11/12/19 0336 11/13/19 0500  11/14/19 0705 11/15/19 0557  WBC 20.4* 17.5* 14.8* 18.2* 17.4*  NEUTROABS 17.0* 14.4*  --   --   --   HGB 9.3* 9.7* 10.4* 9.3* 8.4*  HCT 30.4* 33.2* 34.9* 30.4* 28.7*  MCV 100.3* 104.1* 102.6* 104.1* 104.0*  PLT 264 280 264 195 123456*   Basic Metabolic Panel: Recent Labs  Lab 11/10/19 0443 11/10/19 0443 11/11/19 0543 11/12/19 0336 11/13/19 0500 11/14/19 0705 11/15/19 0557  NA 136   < > 137 137 137 142 139  K 4.4   < > 4.1 4.5 4.8 4.4 3.9  CL 107   < > 106 107 103 106 105  CO2 20*   < > 20* 21* 19* 22 23  GLUCOSE 103*   < > 125* 117* 125* 117* 106*  BUN 24*   < > 26* 28* 32* 37* 39*  CREATININE 0.60   < > 0.64 0.56 0.90 0.67 0.68  CALCIUM 7.9*   < > 8.3* 8.2* 7.8* 8.0* 7.9*  MG 1.9  --  1.9 2.1 1.8  --  1.9  PHOS 2.6  --  2.1* 2.1*  --   --   --    < > = values in this interval not displayed.   GFR: Estimated Creatinine Clearance: 82.5 mL/min (by C-G formula based on SCr of 0.68 mg/dL). Liver Function Tests: Recent Labs  Lab 11/11/19 0543 11/12/19 0336 11/13/19 0500 11/14/19 0705 11/15/19 0557  AST 178* 212* 273* 202* 188*  ALT 57* 63* 87* 74* 64*  ALKPHOS 1,261* 1,440* 1,434* 1,325* 1,112*  BILITOT 2.1* 2.3* 2.6* 3.7* 4.9*  PROT 5.0* 5.1* 4.9* 4.7* 4.8*  ALBUMIN 2.0* 1.9* 1.8* 2.3* 2.5*   No results for input(s): LIPASE, AMYLASE in the last 168 hours. Recent Labs  Lab 11/10/19 0443 11/12/19 1130  AMMONIA 54* 47*   Coagulation Profile: Recent Labs  Lab 11/10/19 0443  INR 1.2   Cardiac Enzymes: No results for input(s): CKTOTAL, CKMB, CKMBINDEX, TROPONINI in the last 168 hours. BNP (last 3 results) No results for input(s): PROBNP in the last 8760 hours. HbA1C: No results for input(s): HGBA1C in the last 72 hours. CBG: No results for input(s): GLUCAP in the last 168 hours. Lipid Profile: No results for input(s): CHOL, HDL, LDLCALC, TRIG, CHOLHDL, LDLDIRECT in the last 72 hours. Thyroid Function Tests: No results for input(s): TSH, T4TOTAL, FREET4,  T3FREE, THYROIDAB in the last 72 hours. Anemia Panel: No results for input(s): VITAMINB12, FOLATE, FERRITIN, TIBC, IRON, RETICCTPCT in the last 72 hours. Sepsis Labs: No results for input(s): PROCALCITON, LATICACIDVEN in the last 168 hours.  Recent Results (from the past 240 hour(s))  Culture, Urine     Status: Abnormal   Collection Time: 11/11/19  6:45 PM   Specimen: Urine, Random  Result Value Ref Range Status   Specimen Description   Final    URINE, RANDOM Performed at [facility] [address], [place], [place] [address]    Special Requests   Final    NONE Performed at [facility], [place] [address], [place],  [address]    Culture (A)  Final    >=100,000 COLONIES/mL PROTEUS VULGARIS >=100,000 COLONIES/mL ESCHERICHIA COLI    Report Status 11/14/2019 FINAL  Final   Organism ID, Bacteria PROTEUS VULGARIS (A)  Final   Organism ID, Bacteria ESCHERICHIA COLI (A)  Final      Susceptibility   Escherichia coli - MIC*    AMPICILLIN 4 SENSITIVE Sensitive     CEFAZOLIN <=4 SENSITIVE Sensitive     CEFTRIAXONE <=1 SENSITIVE Sensitive     CIPROFLOXACIN <=0.25 SENSITIVE Sensitive     GENTAMICIN <=1 SENSITIVE Sensitive     IMIPENEM <=0.25 SENSITIVE Sensitive     NITROFURANTOIN <=16 SENSITIVE Sensitive     TRIMETH/SULFA <=20 SENSITIVE Sensitive     AMPICILLIN/SULBACTAM <=2 SENSITIVE Sensitive     PIP/TAZO <=4 SENSITIVE Sensitive     * >=100,000 COLONIES/mL ESCHERICHIA COLI   Proteus vulgaris - MIC*    AMPICILLIN >=32 RESISTANT Resistant     CEFAZOLIN >=64 RESISTANT Resistant     CEFTRIAXONE <=1 SENSITIVE Sensitive     CIPROFLOXACIN <=0.25 SENSITIVE Sensitive     GENTAMICIN <=1 SENSITIVE Sensitive     IMIPENEM 2 SENSITIVE Sensitive     NITROFURANTOIN RESISTANT  Resistant     TRIMETH/SULFA <=20 SENSITIVE Sensitive     AMPICILLIN/SULBACTAM 8 SENSITIVE Sensitive     PIP/TAZO <=4 SENSITIVE Sensitive     * >=100,000 COLONIES/mL PROTEUS  VULGARIS         Radiology Studies: US Abdomen Limited RUQ  Result Date: 11/14/2019 CLINICAL DATA:  74 year old female with transaminitis. History of breast cancer. EXAM: ULTRASOUND ABDOMEN LIMITED RIGHT UPPER QUADRANT COMPARISON:  CT of the abdomen pelvis dated 10/29/2019. FINDINGS: Gallbladder: Cholecystectomy. Common bile duct: Poorly visualized. Liver: The liver demonstrates a heterogeneous echotexture with innumerable hypoechoic lesions consistent with metastatic disease. 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 Portal vein is patent on color Doppler imaging with normal direction of blood flow towards the liver. Other: There is a small perihepatic ascites. Moderate right renal atrophy. IMPRESSION: 1. Extensive hepatic metastatic disease. 2. Small perihepatic ascites. 3. Patent main portal vein with hepatopetal flow. Electronically Signed   By: Anner Crete M.D.   On: 11/14/2019 16:37        Scheduled Meds: . Chlorhexidine Gluconate Cloth  6 each Topical Daily  . citalopram  10 mg Oral Daily  . feeding supplement (ENSURE ENLIVE)  237 mL Oral TID BM  . fluticasone  1 spray Each Nare BID  . furosemide  60 mg Intravenous BID  . lactulose  30 g Oral Daily  . nystatin  5 mL Mouth/Throat QID  . nystatin   Topical TID  . pantoprazole  40 mg Oral Q supper   Continuous Infusions: . sodium chloride    . famotidine (PEPCID) IV (ONCOLOGY)       Time spent: 25 minutes with over 50% of the time coordinating the patient's care    Harold Hedge, DO [facility] Pager [phone]  Call night coverage person covering after 7pm

## 2019-11-16 LAB — COMPREHENSIVE METABOLIC PANEL
ALT: 68 U/L — ABNORMAL HIGH (ref 0–44)
AST: 197 U/L — ABNORMAL HIGH (ref 15–41)
Albumin: 2.3 g/dL — ABNORMAL LOW (ref 3.5–5.0)
Alkaline Phosphatase: 1185 U/L — ABNORMAL HIGH (ref 38–126)
Anion gap: 12 (ref 5–15)
BUN: 39 mg/dL — ABNORMAL HIGH (ref 8–23)
CO2: 23 mmol/L (ref 22–32)
Calcium: 8.4 mg/dL — ABNORMAL LOW (ref 8.9–10.3)
Chloride: 109 mmol/L (ref 98–111)
Creatinine, Ser: 0.7 mg/dL (ref 0.44–1.00)
GFR calc Af Amer: >60 mL/min (ref >60)
GFR calc non Af Amer: >60 mL/min (ref >60)
Glucose, Bld: 129 mg/dL — ABNORMAL HIGH (ref 70–99)
Potassium: 3.9 mmol/L (ref 3.5–5.1)
Sodium: 144 mmol/L (ref 135–145)
Total Bilirubin: 4.4 mg/dL — ABNORMAL HIGH (ref 0.3–1.2)
Total Protein: 4.9 g/dL — ABNORMAL LOW (ref 6.5–8.1)

## 2019-11-16 MED ORDER — ALBUMIN HUMAN 5 % IV SOLN
25.0000 g | Freq: Once | INTRAVENOUS | Status: AC
  Administered 2019-11-16: 12.5 g via INTRAVENOUS
  Filled 2019-11-16: qty 500

## 2019-11-16 NOTE — Progress Notes (Signed)
PROGRESS NOTE    [name]    Code Status: DNR  YV:[id number] DOB: Mar 02, 1946 DOA: 10/29/2019 LOS: 17 days  PCP: Leonides Sake, MD CC:  Chief Complaint  Patient presents with  . Abdominal Pain  . Abnormal Lab       Hospital Summary   This is a 74 year old female with history of type 2 diabetes, temporal arteritis, migraine, OSA and cervical radiculopathy who was admitted with waxing waning abdominal pain x3 months which had become worse 3 weeks prior to admission with generalized weakness and poor appetite as well as nausea who presented to her PCP and sent to the ED due to concerns of underlying malignancy.  In the ED she was found to have hepatic metastases, omental caking, complex cystic left lower quadrant mass and retroperitoneal adenopathy as well as hyponatremia, hypocalcemia and AKI.  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  Oncology and palliative care have been consulted.  She started on chemotherapy this hospitalization.  Has since been responding to diuresis for anasarca.   A & P   Principal Problem:   Metastatic neoplastic disease ([place]) Active Problems:   Uncontrolled type 2 diabetes mellitus with hyperglycemia, with long-term current use of insulin (HCC)   Hypercalcemia   AKI (acute kidney injury) ([place])   Dehydration with hyponatremia   GERD without esophagitis   Metastatic malignant neoplasm [facility])   Palliative care by specialist   Goals of care, counseling/discussion   DNR (do not resuscitate)   Hypercalcemia of malignancy   Hepatic encephalopathy ([place])   Palliative care encounter   Pressure injury of skin   Hepatic dysfunction   1. 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 Had liver biopsy on 5/14 b. PICC line placement on 5/24 c.  Started chemotherapy with carboplatin and Taxol on 5/25 without significant toxicity d. Appreciate oncology and palliative care recommendations  2. Acute encephalopathy, likely multifactorial: Metabolic and hepatic a. Had elevated ammonia level which improved initially with lactulose however she had multiple BMs and this was decreased b. Continue lactulose and titrate for 2-3 loose BMs daily  3. Transaminitis secondary to metastases a. RUQ ultrasound: extensive hepatic metastatic disease, patent main portal vein  b. Overall improved with the exception of increasing T bili: 4.9->4.4  4. Anasarca, slightly improved a. Good output with diuresis but still significantly overloaded b. Continue Lasix to 60 mg IV twice daily with albumin c. Unable to get accurate weights but finally net -2.5 L d. Nutrition on board  5. NAGMA, resolved  6. Nausea, likely exacerbated by bilirubinemia a. zofran PRN  7. Hypercalcemia, resolved a. Received Zometa x1  8. Hypophosphatemia a. Not obtained today, will order for tomorrow b. Nutrition consulted  9. Reactive Leukocytosis from underlying malignancy and illness, improving a. Continue to monitor for now  10. Leg weakness a. Concern for steroid myopathy vs. Paraneoplastic syndrome per oncology b. Off steroids c. PT on board recommending snf d. Candidate for CIR?   DVT prophylaxis: Heparin Family Communication: Husband at bedside Disposition Plan:  Status is: Inpatient  Remains inpatient appropriate because:Ongoing diagnostic testing needed not appropriate for outpatient work up and IV treatments appropriate due to intensity of illness or inability to take PO continues to need IV diuresis, likely for several more days.     Dispo: The patient is from: Home  Anticipated d/c is to: SNF              Anticipated d/c date is: 3 days              Patient currently is not medically stable to d/c.    Pressure injury documentation    Pressure Injury 11/06/19 Sacrum Stage 2 -  Partial thickness loss of dermis presenting as a shallow open injury with a red, pink wound bed without slough. (Active)  11/06/19 1500  Location: Sacrum  Location Orientation:   Staging: Stage 2 -  Partial thickness loss of dermis presenting as a shallow open injury with a red, pink wound bed without slough.  Wound Description (Comments):   Present on Admission:    None  Consultants  Oncology Palliative care   Procedures  5/24 PICC line placement Started chemotherapy 5/25   Antibiotics   Anti-infectives (From admission, onward)   Start     Dose/Rate Route Frequency Ordered Stop   11/01/19 1615  fluconazole (DIFLUCAN) tablet 150 mg     150 mg Oral  Once 11/01/19 1609 11/01/19 1713        Subjective   Admits to persistent nausea which improved with Zofran.  Husband at bedside.  Patient without any other complaints and no overnight events  Objective   Vitals:   11/15/19 1319 11/15/19 2008 11/16/19 0539 11/16/19 1324  BP: (!) 102/56 102/63 102/74 109/74  Pulse: 84 (!) 110 (!) 109 (!) 107  Resp: 16 18 18 16   Temp: 97.9 F (36.6 C) 98.6 F (37 C) 98.2 F (36.8 C) 98.5 F (36.9 C)  TempSrc: Oral Oral Oral Oral  SpO2: 97% 97% 94% 95%  Weight:      Height:        Intake/Output Summary (Last 24 hours) at 11/16/2019 1640 Last data filed at 11/16/2019 1030 Gross per 24 hour  Intake -  Output 1500 ml  Net -1500 ml   Filed Weights   11/07/19 2041 11/08/19 2139 11/09/19 1609  Weight: 120.1 kg 120.1 kg 123.4 kg    Examination:  Physical Exam Vitals and nursing note reviewed.  Constitutional:      General: She is not in acute distress.    Appearance: She is obese. She is ill-appearing.  Cardiovascular:     Rate and Rhythm: Normal rate and regular rhythm.  Pulmonary:     Effort: Pulmonary effort is normal. No respiratory distress.  Abdominal:     Tenderness: There is no abdominal tenderness.     Comments: Baseline  firm from metastases  Musculoskeletal:     Comments: Anasarca with improving volume  Skin:    Coloration: Skin is not pale.  Neurological:     Mental Status: Mental status is at baseline.  Psychiatric:        Mood and Affect: Mood normal.     Data Reviewed: I have personally reviewed following labs and imaging studies  CBC: Recent Labs  Lab 11/11/19 0543 11/12/19 0336 11/13/19 0500 11/14/19 0705 11/15/19 0557  WBC 20.4* 17.5* 14.8* 18.2* 17.4*  NEUTROABS 17.0* 14.4*  --   --   --   HGB 9.3* 9.7* 10.4* 9.3* 8.4*  HCT 30.4* 33.2* 34.9* 30.4* 28.7*  MCV 100.3* 104.1* 102.6* 104.1* 104.0*  PLT 264 280 264 195 123456*   Basic Metabolic Panel: Recent Labs  Lab 11/10/19 0443 11/10/19 0443 11/11/19 0543 11/11/19 0543 11/12/19 0336 11/13/19 0500 11/14/19 0705 11/15/19 0557 11/16/19 0559  NA 136   < >  137   < > 137 137 142 139 144  K 4.4   < > 4.1   < > 4.5 4.8 4.4 3.9 3.9  CL 107   < > 106   < > 107 103 106 105 109  CO2 20*   < > 20*   < > 21* 19* 22 23 23   GLUCOSE 103*   < > 125*   < > 117* 125* 117* 106* 129*  BUN 24*   < > 26*   < > 28* 32* 37* 39* 39*  CREATININE 0.60   < > 0.64   < > 0.56 0.90 0.67 0.68 0.70  CALCIUM 7.9*   < > 8.3*   < > 8.2* 7.8* 8.0* 7.9* 8.4*  MG 1.9  --  1.9  --  2.1 1.8  --  1.9  --   PHOS 2.6  --  2.1*  --  2.1*  --   --   --   --    < > = values in this interval not displayed.   GFR: Estimated Creatinine Clearance: 82.5 mL/min (by C-G formula based on SCr of 0.7 mg/dL). Liver Function Tests: Recent Labs  Lab 11/12/19 0336 11/13/19 0500 11/14/19 0705 11/15/19 0557 11/16/19 0559  AST 212* 273* 202* 188* 197*  ALT 63* 87* 74* 64* 68*  ALKPHOS 1,440* 1,434* 1,325* 1,112* 1,185*  BILITOT 2.3* 2.6* 3.7* 4.9* 4.4*  PROT 5.1* 4.9* 4.7* 4.8* 4.9*  ALBUMIN 1.9* 1.8* 2.3* 2.5* 2.3*   No results for input(s): LIPASE, AMYLASE in the last 168 hours. Recent Labs  Lab 11/10/19 0443 11/12/19 1130  AMMONIA 54* 47*   Coagulation Profile:  Recent Labs  Lab 11/10/19 0443  INR 1.2   Cardiac Enzymes: No results for input(s): CKTOTAL, CKMB, CKMBINDEX, TROPONINI in the last 168 hours. BNP (last 3 results) No results for input(s): PROBNP in the last 8760 hours. HbA1C: No results for input(s): HGBA1C in the last 72 hours. CBG: No results for input(s): GLUCAP in the last 168 hours. Lipid Profile: No results for input(s): CHOL, HDL, LDLCALC, TRIG, CHOLHDL, LDLDIRECT in the last 72 hours. Thyroid Function Tests: No results for input(s): TSH, T4TOTAL, FREET4, T3FREE, THYROIDAB in the last 72 hours. Anemia Panel: No results for input(s): VITAMINB12, FOLATE, FERRITIN, TIBC, IRON, RETICCTPCT in the last 72 hours. Sepsis Labs: No results for input(s): PROCALCITON, LATICACIDVEN in the last 168 hours.  Recent Results (from the past 240 hour(s))  Culture, Urine     Status: Abnormal   Collection Time: 11/11/19  6:45 PM   Specimen: Urine, Random  Result Value Ref Range Status   Specimen Description   Final    URINE, RANDOM Performed at [facility] [address], [place], [place] [address]    Special Requests   Final    NONE Performed at [facility], [place] [address] [place],  [address]    Culture (A)  Final    >=100,000 COLONIES/mL PROTEUS VULGARIS >=100,000 COLONIES/mL ESCHERICHIA COLI    Report Status 11/14/2019 FINAL  Final   Organism ID, Bacteria PROTEUS VULGARIS (A)  Final   Organism ID, Bacteria ESCHERICHIA COLI (A)  Final      Susceptibility   Escherichia coli - MIC*    AMPICILLIN 4 SENSITIVE Sensitive     CEFAZOLIN <=4 SENSITIVE Sensitive     CEFTRIAXONE <=1 SENSITIVE Sensitive     CIPROFLOXACIN <=0.25 SENSITIVE Sensitive     GENTAMICIN <=1 SENSITIVE Sensitive  IMIPENEM <=0.25 SENSITIVE Sensitive     NITROFURANTOIN <=16 SENSITIVE Sensitive     TRIMETH/SULFA <=20 SENSITIVE Sensitive     AMPICILLIN/SULBACTAM <=2 SENSITIVE Sensitive     PIP/TAZO <=4 SENSITIVE  Sensitive     * >=100,000 COLONIES/mL ESCHERICHIA COLI   Proteus vulgaris - MIC*    AMPICILLIN >=32 RESISTANT Resistant     CEFAZOLIN >=64 RESISTANT Resistant     CEFTRIAXONE <=1 SENSITIVE Sensitive     CIPROFLOXACIN <=0.25 SENSITIVE Sensitive     GENTAMICIN <=1 SENSITIVE Sensitive     IMIPENEM 2 SENSITIVE Sensitive     NITROFURANTOIN RESISTANT Resistant     TRIMETH/SULFA <=20 SENSITIVE Sensitive     AMPICILLIN/SULBACTAM 8 SENSITIVE Sensitive     PIP/TAZO <=4 SENSITIVE Sensitive     * >=100,000 COLONIES/mL PROTEUS VULGARIS         Radiology Studies: No results found.      Scheduled Meds: . Chlorhexidine Gluconate Cloth  6 each Topical Daily  . citalopram  10 mg Oral Daily  . feeding supplement (ENSURE ENLIVE)  237 mL Oral TID BM  . fluticasone  1 spray Each Nare BID  . furosemide  60 mg Intravenous BID  . lactulose  30 g Oral Daily  . nystatin  5 mL Mouth/Throat QID  . nystatin   Topical TID  . pantoprazole  40 mg Oral Q supper   Continuous Infusions: . sodium chloride    . famotidine (PEPCID) IV (ONCOLOGY)       Time spent: 20 minutes with over 50% of the time coordinating the patient's care    Harold Hedge, DO [facility] Pager [phone]  Call night coverage person covering after 7pm

## 2019-11-17 DIAGNOSIS — D702 Other drug-induced agranulocytosis: Secondary | ICD-10-CM

## 2019-11-17 DIAGNOSIS — R7989 Other specified abnormal findings of blood chemistry: Secondary | ICD-10-CM

## 2019-11-17 DIAGNOSIS — D701 Agranulocytosis secondary to cancer chemotherapy: Secondary | ICD-10-CM

## 2019-11-17 LAB — CBC WITH DIFFERENTIAL/PLATELET
Abs Immature Granulocytes: 0.01 10*3/uL (ref 0.00–0.07)
Basophils Absolute: 0 10*3/uL (ref 0.0–0.1)
Basophils Relative: 1 %
Eosinophils Absolute: 0 10*3/uL (ref 0.0–0.5)
Eosinophils Relative: 0 %
HCT: 28 % — ABNORMAL LOW (ref 36.0–46.0)
Hemoglobin: 8.5 g/dL — ABNORMAL LOW (ref 12.0–15.0)
Immature Granulocytes: 1 %
Lymphocytes Relative: 33 %
Lymphs Abs: 0.3 10*3/uL — ABNORMAL LOW (ref 0.7–4.0)
MCH: 31.4 pg (ref 26.0–34.0)
MCHC: 30.4 g/dL (ref 30.0–36.0)
MCV: 103.3 fL — ABNORMAL HIGH (ref 80.0–100.0)
Monocytes Absolute: 0.1 10*3/uL (ref 0.1–1.0)
Monocytes Relative: 9 %
Neutro Abs: 0.5 10*3/uL — ABNORMAL LOW (ref 1.7–7.7)
Neutrophils Relative %: 56 %
Platelets: 92 10*3/uL — ABNORMAL LOW (ref 150–400)
RBC: 2.71 MIL/uL — ABNORMAL LOW (ref 3.87–5.11)
RDW: 21.2 % — ABNORMAL HIGH (ref 11.5–15.5)
WBC: 0.9 10*3/uL — CL (ref 4.0–10.5)
nRBC: 0 % (ref 0.0–0.2)

## 2019-11-17 LAB — GLUCOSE, CAPILLARY: Glucose-Capillary: 150 mg/dL — ABNORMAL HIGH (ref 70–99)

## 2019-11-17 LAB — COMPREHENSIVE METABOLIC PANEL
ALT: 62 U/L — ABNORMAL HIGH (ref 0–44)
AST: 181 U/L — ABNORMAL HIGH (ref 15–41)
Albumin: 2.3 g/dL — ABNORMAL LOW (ref 3.5–5.0)
Alkaline Phosphatase: 995 U/L — ABNORMAL HIGH (ref 38–126)
Anion gap: 12 (ref 5–15)
BUN: 37 mg/dL — ABNORMAL HIGH (ref 8–23)
CO2: 25 mmol/L (ref 22–32)
Calcium: 8.3 mg/dL — ABNORMAL LOW (ref 8.9–10.3)
Chloride: 105 mmol/L (ref 98–111)
Creatinine, Ser: 0.67 mg/dL (ref 0.44–1.00)
GFR calc Af Amer: >60 mL/min (ref >60)
GFR calc non Af Amer: >60 mL/min (ref >60)
Glucose, Bld: 139 mg/dL — ABNORMAL HIGH (ref 70–99)
Potassium: 3.4 mmol/L — ABNORMAL LOW (ref 3.5–5.1)
Sodium: 142 mmol/L (ref 135–145)
Total Bilirubin: 4.4 mg/dL — ABNORMAL HIGH (ref 0.3–1.2)
Total Protein: 4.5 g/dL — ABNORMAL LOW (ref 6.5–8.1)

## 2019-11-17 LAB — LACTIC ACID, PLASMA
Lactic Acid, Venous: 6.6 mmol/L (ref 0.5–1.9)
Lactic Acid, Venous: 7 mmol/L (ref 0.5–1.9)

## 2019-11-17 LAB — MRSA PCR SCREENING: MRSA by PCR: NEGATIVE

## 2019-11-17 LAB — APTT: aPTT: 31 seconds (ref 24–36)

## 2019-11-17 LAB — PROTIME-INR
INR: 1.5 — ABNORMAL HIGH (ref 0.8–1.2)
Prothrombin Time: 17.8 seconds — ABNORMAL HIGH (ref 11.4–15.2)

## 2019-11-17 MED ORDER — CHLORHEXIDINE GLUCONATE CLOTH 2 % EX PADS
6.0000 | MEDICATED_PAD | Freq: Every day | CUTANEOUS | Status: DC
  Administered 2019-11-18 – 2019-11-19 (×2): 6 via TOPICAL

## 2019-11-17 MED ORDER — TBO-FILGRASTIM 300 MCG/0.5ML ~~LOC~~ SOSY
300.0000 ug | PREFILLED_SYRINGE | Freq: Every day | SUBCUTANEOUS | Status: DC
Start: 2019-11-17 — End: 2019-11-18
  Administered 2019-11-17: 300 ug via SUBCUTANEOUS
  Filled 2019-11-17: qty 0.5

## 2019-11-17 MED ORDER — ORAL CARE MOUTH RINSE
15.0000 mL | Freq: Two times a day (BID) | OROMUCOSAL | Status: DC
  Administered 2019-11-18 (×2): 15 mL via OROMUCOSAL

## 2019-11-17 MED ORDER — VANCOMYCIN HCL 10 G IV SOLR
2500.0000 mg | INTRAVENOUS | Status: AC
  Administered 2019-11-17: 2500 mg via INTRAVENOUS
  Filled 2019-11-17: qty 2000

## 2019-11-17 MED ORDER — LACTATED RINGERS IV BOLUS
500.0000 mL | Freq: Once | INTRAVENOUS | Status: AC
  Administered 2019-11-17: 500 mL via INTRAVENOUS

## 2019-11-17 MED ORDER — KETOROLAC TROMETHAMINE 30 MG/ML IJ SOLN
30.0000 mg | Freq: Once | INTRAMUSCULAR | Status: AC
  Administered 2019-11-18: 30 mg via INTRAVENOUS
  Filled 2019-11-17: qty 1

## 2019-11-17 MED ORDER — ORAL CARE MOUTH RINSE
15.0000 mL | Freq: Two times a day (BID) | OROMUCOSAL | Status: DC
  Administered 2019-11-18 – 2019-11-19 (×4): 15 mL via OROMUCOSAL

## 2019-11-17 MED ORDER — SODIUM CHLORIDE 0.9 % IV SOLN
2.0000 g | Freq: Three times a day (TID) | INTRAVENOUS | Status: DC
  Administered 2019-11-18 – 2019-11-19 (×5): 2 g via INTRAVENOUS
  Filled 2019-11-17 (×7): qty 2

## 2019-11-17 MED ORDER — LACTATED RINGERS IV BOLUS
1000.0000 mL | Freq: Once | INTRAVENOUS | Status: AC
  Administered 2019-11-17: 1000 mL via INTRAVENOUS

## 2019-11-17 MED ORDER — VANCOMYCIN HCL IN DEXTROSE 1-5 GM/200ML-% IV SOLN
1000.0000 mg | Freq: Two times a day (BID) | INTRAVENOUS | Status: DC
  Administered 2019-11-18 – 2019-11-19 (×3): 1000 mg via INTRAVENOUS
  Filled 2019-11-17 (×3): qty 200

## 2019-11-17 MED ORDER — CHLORHEXIDINE GLUCONATE 0.12 % MT SOLN
15.0000 mL | Freq: Two times a day (BID) | OROMUCOSAL | Status: DC
  Administered 2019-11-18 – 2019-11-19 (×4): 15 mL via OROMUCOSAL
  Filled 2019-11-17 (×4): qty 15

## 2019-11-17 MED ORDER — SODIUM CHLORIDE 0.9 % IV SOLN
2.0000 g | INTRAVENOUS | Status: AC
  Administered 2019-11-17: 2 g via INTRAVENOUS
  Filled 2019-11-17: qty 2

## 2019-11-17 NOTE — Progress Notes (Signed)
Physical Therapy Treatment Patient Details Name: [name] MRN: [id number] DOB: [birth year] Today's Date: 11/17/2019    History of Present Illness Patient is 74 y.o. female with PMH significant for DM, temporal arteritis, breast cancer, OSA on CPAP, Rt TKA, and ACDF. Pt presented to ED at [facility] on 10/29/19 and was admitted with waxing and waning abdominal pain for 3 months that has gotten worse over the last 3 weeks, generalized weakness, poor appetite and nausea. In ED, pt found to have hepatic metastasis, omental caking, complex cystic LLQ mass retroperitoneal adenopathy. Pt underwent liver biopsy by IR on 5/14. 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 Pt transferred to [facility] on 5/23. IR was placed PICC line on 5/24 and pt initiated chemotherapy on 5/25.     PT Comments    Patient remains limited in therapy session for fatigue. She requires MAX assist +2 or +3 for rolling in bed. Pt able to initiate LE knee flexion and with tactile cues at elbow and hand can initiate reaching to bed rails with assist. Pt performed ~3x rolling bil and required greater assist each time due to fatigue. New wounds noted around perineum, inner thigh, and skin fold of pannus/abdomen. Patient will benefit from wound consult and use of interdry at skin folds to help with moisture related skin breakdown. RN notified. She will continue to benefit from skilled PT interventions to address impairments.    Follow Up Recommendations  SNF     Equipment Recommendations  Hospital bed;Other (comment);Wheelchair (measurements PT);Wheelchair cushion (measurements PT)    Recommendations for Other Services       Precautions / Restrictions Precautions Precautions: Fall Restrictions Weight Bearing Restrictions: No    Mobility  Bed Mobility Overal bed mobility: Needs Assistance Bed Mobility: Rolling Rolling: Total assist;+2 for physical assistance;+2 for  safety/equipment         General bed mobility comments: Pt with BM at arrival. Pt able to initiate knee flexion and reaching with Lt UE more easily than Rt UE. She requried cues at elbows and assist for hand to grip therapist hand to guide to bed rail. MAX assist at hips to initiate lower trunk roll and pt able to pull with UE on bed rail for hold uppeer trunk on side but unable to maintain lower trunk rotation without Max assist at hips. Roll completed 3x each direction. Pt very fatigued after rolling and declined to perform further mobility at EOB.   Transfers       Ambulation/Gait      Stairs      Wheelchair Mobility    Modified Rankin (Stroke Patients Only)       Balance           Cognition Arousal/Alertness: Awake/alert Behavior During Therapy: WFL for tasks assessed/performed Overall Cognitive Status: Impaired/Different from baseline Area of Impairment: Memory;Following commands;Awareness;Problem solving          Memory: Decreased short-term memory Following Commands: Follows one step commands consistently;Follows one step commands inconsistently     Problem Solving: Decreased initiation General Comments: pt with depressed demeanor during session. decresed initiation throughout as she became more fatigued.      Exercises      General Comments General comments (skin integrity, edema, etc.): pt noted to have new wound developed around perenium, inner thigh, and along the pannus noted to have blisters forming and opening. RN and NT notified and suggested RN order "interdry" to use at abdoemn/panus where pt has significant skin  break down due to eccesive moisture. pillowcases applied in skin folds to aid with moisture wicking until interdry is brought up for pt. recommend wound consult and RN reports already in place.       Pertinent Vitals/Pain Pain Assessment: Faces Faces Pain Scale: Hurts even more Pain Location: generalized discomfort, buttock  pain Pain Descriptors / Indicators: Discomfort;Grimacing Pain Intervention(s): Limited activity within patient's tolerance;Monitored during session;Repositioned           PT Goals (current goals can now be found in the care plan section) Acute Rehab PT Goals Patient Stated Goal: to get stronger PT Goal Formulation: With patient Time For Goal Achievement: 11/25/19 Potential to Achieve Goals: Fair Progress towards PT goals: Not progressing toward goals - comment(not progressing today due to fatigue)    Frequency    Min 2X/week      PT Plan Current plan remains appropriate    Co-evaluation              AM-PAC PT "6 Clicks" Mobility   Outcome Measure  Help needed turning from your back to your side while in a flat bed without using bedrails?: Total Help needed moving from lying on your back to sitting on the side of a flat bed without using bedrails?: Total Help needed moving to and from a bed to a chair (including a wheelchair)?: Total Help needed standing up from a chair using your arms (e.g., wheelchair or bedside chair)?: Total Help needed to walk in hospital room?: Total Help needed climbing 3-5 steps with a railing? : Total 6 Click Score: 6    End of Session Equipment Utilized During Treatment: Other (comment)(bed pad) Activity Tolerance: Patient limited by pain;Patient limited by fatigue Patient left: in bed;with call bell/phone within reach;with bed alarm set Nurse Communication: Mobility status PT Visit Diagnosis: Muscle weakness (generalized) (M62.81);Other abnormalities of gait and mobility (R26.89)     Time: [id number] PT Time Calculation (min) (ACUTE ONLY): 37 min  Charges:  $Therapeutic Activity: 23-37 mins                    Verner Mould, DPT Physical Therapist with [facility] [phone]  11/17/2019 3:16 PM

## 2019-11-17 NOTE — Progress Notes (Addendum)
PROGRESS NOTE    TAMU MALSAM    Code Status: DNR  [id number] DOB: [birth year] DOA: 10/29/2019 LOS: 18 days  PCP: Leonides Sake, MD CC:  Chief Complaint  Patient presents with  . Abdominal Pain  . Abnormal Lab       Hospital Summary   This is a 74 year old female with history of type 2 diabetes, temporal arteritis, migraine, OSA and cervical radiculopathy who was admitted with waxing waning abdominal pain x3 months which had become worse 3 weeks prior to admission with generalized weakness and poor appetite as well as nausea who presented to her PCP and sent to the ED due to concerns of underlying malignancy.  In the ED she was found to have hepatic metastases, omental caking, complex cystic left lower quadrant mass and retroperitoneal adenopathy as well as hyponatremia, hypocalcemia and AKI.  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  Oncology and palliative care have been consulted.  She started on chemotherapy this hospitalization.  Has since been responding to diuresis for anasarca.  5/31: WBC 0.9 with low grade fever, Proteus and E coli UTI. Started on Vanc/Cefepime and Filgrastim.  A & P   Principal Problem:   Metastatic neoplastic disease ([place]) Active Problems:   Uncontrolled type 2 diabetes mellitus with hyperglycemia, with long-term current use of insulin (HCC)   Hypercalcemia   AKI (acute kidney injury) ([place])   Dehydration with hyponatremia   GERD without esophagitis   Metastatic malignant neoplasm ([place])   Palliative care by specialist   Goals of care, counseling/discussion   DNR (do not resuscitate)   Hypercalcemia of malignancy   Hepatic encephalopathy ([place])   Palliative care encounter   Pressure injury of skin   Hepatic dysfunction   Drug-induced neutropenia ([place])   1. Metastatic cancer most concerning for primary ovarian versus peritoneal carcinoma (peritoneal  carcinoma favored given history of hysterectomy with negative pathology) a. Had liver biopsy on 5/14 b. PICC line placement on 5/24 c. Started chemotherapy with carboplatin and Taxol on 5/25 without significant toxicity d. Appreciate oncology and palliative care recommendations. Oncology feels she has a poor prognosis but still too early to tell if she will have improvement from chemo e. [place] discussion today. Family considering hospice/comfort.  f. Palliative to discuss with family today.   2. Sepsis with E coli and Proteus UTI a. Previously was asymptomatic from UTI perspective without any urinary complaints, now change in clinical status b. WBC 18.2->17.9-> 0.9 overnight, BP 100/56, tachycardic with low grade fever this afternoon 100.78F c. LR bolus, hold lasix, monitor for volume overload d. Follow up blood cultures e. Filgrastim per oncology f. Poor overall prognosis   3. Acute encephalopathy, likely multifactorial: Metabolic, hepatic and now sepsis a. Had elevated ammonia level which improved initially with lactulose however she had multiple BMs and this was decreased b. Continue lactulose and titrate for 2-3 loose BMs daily  4. Transaminitis secondary to metastases, improving a. RUQ ultrasound: extensive hepatic metastatic disease, patent main portal vein  b. T bili: 4.9->4.4  5. Anasarca, slightly improved a. Hold lasix as above b. Unable to get accurate weights but -4.7 L c. Nutrition on board  6. NAGMA, resolved  7. Nausea, likely exacerbated by bilirubinemia a. zofran PRN  8. Hypercalcemia, resolved a. Received Zometa x1  9. Oral Candidiasis a. Getting Nystatin  10. Hypophosphatemia a. Nutrition consulted  11. Leg weakness a. 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 Off steroids c. PT on board recommending snf d. Candidate for CIR?   DVT prophylaxis: Heparin Family Communication: [place] discussion with husband at bedside    Disposition Plan:  Status is: Inpatient  Remains inpatient appropriate because:Ongoing diagnostic testing needed not appropriate for outpatient work up and IV treatments appropriate due to intensity of illness or inability to take PO Sepsis   Dispo: The patient is from: Home              Anticipated d/c is to: TBD              Anticipated d/c date is: > 3 days              Patient currently is not medically stable to d/c.    Pressure injury documentation   Pressure Injury 11/06/19 Sacrum Stage 2 -  Partial thickness loss of dermis presenting as a shallow open injury with a red, pink wound bed without slough. (Active)  11/06/19 1500  Location: Sacrum  Location Orientation:   Staging: Stage 2 -  Partial thickness loss of dermis presenting as a shallow open injury with a red, pink wound bed without slough.  Wound Description (Comments):   Present on Admission:    None  Consultants  Oncology Palliative care   Procedures  5/24 PICC line placement Started chemotherapy 5/25   Antibiotics   Anti-infectives (From admission, onward)   Start     Dose/Rate Route Frequency Ordered Stop   11/01/19 1615  fluconazole (DIFLUCAN) tablet 150 mg     150 mg Oral  Once 11/01/19 1609 11/01/19 1713        Subjective   Admits improved nausea with zofran. Husband at bedside is helping to feed the patient and states she is tolerating some Ensure and milk but not eating much solids. He stepped outside with me and was concerned for the patient's overall prognosis. He was concerned that he would not be able to take care of her at home when it came time for discharge and was not sure that SNF was the best place for her. He was asking questions regarding hospice services and mentioned that he wanted to talk to his kids regarding next steps.   Labs came back later today after my visit with the patient with critical result of WBC 0.9. Also, patient had an increased temp this afternoon of 100.89F.  Discussed case with Dr. Benay Spice and decided on plan as above.   Objective   Vitals:   11/16/19 2041 11/17/19 0552 11/17/19 1309 11/17/19 1315  BP: 137/69 108/63 (!) 100/56   Pulse: (!) 109 (!) 109 (!) 124 100  Resp: 18 18 16    Temp: 98.9 F (37.2 C) 99.3 F (37.4 C) 100.1 F (37.8 C)   TempSrc: Oral Axillary Oral   SpO2: 99% 95% 95%   Weight:      Height:        Intake/Output Summary (Last 24 hours) at 11/17/2019 1428 Last data filed at 11/17/2019 0551 Gross per 24 hour  Intake --  Output 1800 ml  Net -1800 ml   Filed Weights   11/07/19 2041 11/08/19 2139 11/09/19 1609  Weight: 120.1 kg 120.1 kg 123.4 kg    Examination:  Physical Exam Vitals and nursing note reviewed. Exam conducted with a chaperone present.  Constitutional:      General: She is not in acute distress.    Appearance: She is ill-appearing.  HENT:  Head:     Comments: thrush Abdominal:     General: Abdomen is flat. There is no distension.     Tenderness: There is no abdominal tenderness.  Neurological:     Mental Status: She is disoriented.     Comments: Oriented to place only  Psychiatric:        Mood and Affect: Mood is not anxious.     Data Reviewed: I have personally reviewed following labs and imaging studies  CBC: Recent Labs  Lab 11/11/19 0543 11/11/19 0543 11/12/19 0336 11/13/19 0500 11/14/19 0705 11/15/19 0557 11/17/19 1026  WBC 20.4*   < > 17.5* 14.8* 18.2* 17.4* 0.9*  NEUTROABS 17.0*  --  14.4*  --   --   --  0.5*  HGB 9.3*   < > 9.7* 10.4* 9.3* 8.4* 8.5*  HCT 30.4*   < > 33.2* 34.9* 30.4* 28.7* 28.0*  MCV 100.3*   < > 104.1* 102.6* 104.1* 104.0* 103.3*  PLT 264   < > 280 264 195 149* 92*   < > = values in this interval not displayed.   Basic Metabolic Panel: Recent Labs  Lab 11/11/19 0543 11/11/19 0543 11/12/19 [id number] 11/12/19 0336 11/13/19 0500 11/14/19 0705 11/15/19 0557 11/16/19 0559 11/17/19 0634  NA 137   < > 137   < > 137 142 139 144 142  K 4.1   < >  4.5   < > 4.8 4.4 3.9 3.9 3.4*  CL 106   < > 107   < > 103 106 105 109 105  CO2 20*   < > 21*   < > 19* 22 23 23 25   GLUCOSE 125*   < > 117*   < > 125* 117* 106* 129* 139*  BUN 26*   < > 28*   < > 32* 37* 39* 39* 37*  CREATININE 0.64   < > 0.56   < > 0.90 0.67 0.68 0.70 0.67  CALCIUM 8.3*   < > 8.2*   < > 7.8* 8.0* 7.9* 8.4* 8.3*  MG 1.9  --  2.1  --  1.8  --  1.9  --   --   PHOS 2.1*  --  2.1*  --   --   --   --   --   --    < > = values in this interval not displayed.   GFR: Estimated Creatinine Clearance: 82.5 mL/min (by C-G formula based on SCr of 0.67 mg/dL). Liver Function Tests: Recent Labs  Lab 11/13/19 0500 11/14/19 0705 11/15/19 0557 11/16/19 0559 11/17/19 0634  AST 273* 202* 188* 197* 181*  ALT 87* 74* 64* 68* 62*  ALKPHOS 1,434* 1,325* 1,112* 1,185* 995*  BILITOT 2.6* 3.7* 4.9* 4.4* 4.4*  PROT 4.9* 4.7* 4.8* 4.9* 4.5*  ALBUMIN 1.8* 2.3* 2.5* 2.3* 2.3*   No results for input(s): LIPASE, AMYLASE in the last 168 hours. Recent Labs  Lab 11/12/19 1130  AMMONIA 47*   Coagulation Profile: No results for input(s): INR, PROTIME in the last 168 hours. Cardiac Enzymes: No results for input(s): CKTOTAL, CKMB, CKMBINDEX, TROPONINI in the last 168 hours. BNP (last 3 results) No results for input(s): PROBNP in the last 8760 hours. HbA1C: No results for input(s): HGBA1C in the last 72 hours. CBG: No results for input(s): GLUCAP in the last 168 hours. Lipid Profile: No results for input(s): CHOL, HDL, LDLCALC, TRIG, CHOLHDL, LDLDIRECT in the last 72 hours. Thyroid Function Tests: No results for input(s): TSH, T4TOTAL,  FREET4, T3FREE, THYROIDAB in the last 72 hours. Anemia Panel: No results for input(s): VITAMINB12, FOLATE, FERRITIN, TIBC, IRON, RETICCTPCT in the last 72 hours. Sepsis Labs: No results for input(s): PROCALCITON, LATICACIDVEN in the last 168 hours.  Recent Results (from the past 240 hour(s))  Culture, Urine     Status: Abnormal   Collection Time:  11/11/19  6:45 PM   Specimen: Urine, Random  Result Value Ref Range Status   Specimen Description   Final    URINE, RANDOM Performed at [facility] [address], [place], [place] [address]    Special Requests   Final    NONE Performed at [facility], [place] [address] [place], [place] [address]    Culture (A)  Final    >=100,000 COLONIES/mL PROTEUS VULGARIS >=100,000 COLONIES/mL ESCHERICHIA COLI    Report Status 11/14/2019 FINAL  Final   Organism ID, Bacteria PROTEUS VULGARIS (A)  Final   Organism ID, Bacteria ESCHERICHIA COLI (A)  Final      Susceptibility   Escherichia coli - MIC*    AMPICILLIN 4 SENSITIVE Sensitive     CEFAZOLIN <=4 SENSITIVE Sensitive     CEFTRIAXONE <=1 SENSITIVE Sensitive     CIPROFLOXACIN <=0.25 SENSITIVE Sensitive     GENTAMICIN <=1 SENSITIVE Sensitive     IMIPENEM <=0.25 SENSITIVE Sensitive     NITROFURANTOIN <=16 SENSITIVE Sensitive     TRIMETH/SULFA <=20 SENSITIVE Sensitive     AMPICILLIN/SULBACTAM <=2 SENSITIVE Sensitive     PIP/TAZO <=4 SENSITIVE Sensitive     * >=100,000 COLONIES/mL ESCHERICHIA COLI   Proteus vulgaris - MIC*    AMPICILLIN >=32 RESISTANT Resistant     CEFAZOLIN >=64 RESISTANT Resistant     CEFTRIAXONE <=1 SENSITIVE Sensitive     CIPROFLOXACIN <=0.25 SENSITIVE Sensitive     GENTAMICIN <=1 SENSITIVE Sensitive     IMIPENEM 2 SENSITIVE Sensitive     NITROFURANTOIN RESISTANT Resistant     TRIMETH/SULFA <=20 SENSITIVE Sensitive     AMPICILLIN/SULBACTAM 8 SENSITIVE Sensitive     PIP/TAZO <=4 SENSITIVE Sensitive     * >=100,000 COLONIES/mL PROTEUS VULGARIS         Radiology Studies: No results found.      Scheduled Meds: . Chlorhexidine Gluconate Cloth  6 each Topical Daily  . citalopram  10 mg Oral Daily  . feeding supplement (ENSURE ENLIVE)  237 mL Oral TID BM  . fluticasone  1 spray Each Nare BID  . furosemide  60 mg Intravenous BID  . lactulose  30 g Oral Daily  .  nystatin  5 mL Mouth/Throat QID  . nystatin   Topical TID  . pantoprazole  40 mg Oral Q supper  . Tbo-Filgrastim  300 mcg Subcutaneous q1800   Continuous Infusions: . sodium chloride       Time spent: 35 minutes with over 50% of the time coordinating the patient's care    Harold Hedge, DO [facility] Pager [phone]  Call night coverage person covering after 7pm

## 2019-11-17 NOTE — Progress Notes (Signed)
CRITICAL VALUE ALERT  Critical Value:  WBC 0.9  Date & Time Notied: 11/17/19 @ [id number]  Provider Notified: Dr Neysa Bonito  Orders Received/Actions taken: yes

## 2019-11-17 NOTE — Progress Notes (Signed)
HEMATOLOGY-ONCOLOGY PROGRESS NOTE  SUBJECTIVE: She is alert this morning.  PHYSICAL EXAMINATION:  Vitals:   11/16/19 2041 11/17/19 0552  BP: 137/69 108/63  Pulse: (!) 109 (!) 109  Resp: 18 18  Temp: 98.9 F (37.2 C) 99.3 F (37.4 C)  SpO2: 99% 95%   Filed Weights   11/07/19 2041 11/08/19 2139 11/09/19 1609  Weight: 264 lb 12.4 oz (120.1 kg) 264 lb 12.6 oz (120.1 kg) 272 lb (123.4 kg)    Intake/Output from previous day: 05/30 0701 - 05/31 0700 In: -  Out: 2550 [Urine:2550] HEENT: No thrush, the mouth is dry Lungs: Clear anteriorly Cardiac: Irregular ABDOMEN: Soft, liver edge palpable in the upper abdomen, mild right upper abdomen tenderness, erythema at the right and lower abdomen pannus NEURO: Alert, not oriented to year.  Oriented to diagnosis Vascular: Pitting edema to the bilateral upper and lower extremities-improved  PICC without erythema or drainage  LABORATORY DATA:  I have reviewed the data as listed CMP Latest Ref Rng & Units 11/16/2019 11/15/2019 11/14/2019  Glucose 70 - 99 mg/dL 129(H) 106(H) 117(H)  BUN 8 - 23 mg/dL 39(H) 39(H) 37(H)  Creatinine 0.44 - 1.00 mg/dL 0.70 0.68 0.67  Sodium 135 - 145 mmol/L 144 139 142  Potassium 3.5 - 5.1 mmol/L 3.9 3.9 4.4  Chloride 98 - 111 mmol/L 109 105 106  CO2 22 - 32 mmol/L 23 23 22   Calcium 8.9 - 10.3 mg/dL 8.4(L) 7.9(L) 8.0(L)  Total Protein 6.5 - 8.1 g/dL 4.9(L) 4.8(L) 4.7(L)  Total Bilirubin 0.3 - 1.2 mg/dL 4.4(H) 4.9(H) 3.7(H)  Alkaline Phos 38 - 126 U/L 1,185(H) 1,112(H) 1,325(H)  AST 15 - 41 U/L 197(H) 188(H) 202(H)  ALT 0 - 44 U/L 68(H) 64(H) 74(H)    Lab Results  Component Value Date   WBC 0.9 (LL) 11/17/2019   HGB 8.5 (L) 11/17/2019   HCT 28.0 (L) 11/17/2019   MCV 103.3 (H) 11/17/2019   PLT PENDING 11/17/2019   NEUTROABS PENDING 11/17/2019    CT CHEST WO CONTRAST  Result Date: 10/30/2019 CLINICAL DATA:  Metastatic malignancy EXAM: CT CHEST WITHOUT CONTRAST TECHNIQUE: Multidetector CT imaging of the  chest was performed following the standard protocol without IV contrast. COMPARISON:  10/29/2019, 06/18/2019 FINDINGS: Cardiovascular: No pericardial effusion. Normal caliber of the thoracic aorta. Minimal atherosclerosis. Mediastinum/Nodes: Numerous enlarged mediastinal lymph nodes are identified. Index lymph node in the right paratracheal region measures 21 x 21 mm, previous having measured 27 x 22 mm. Increased number of enlarged lymph nodes throughout the mediastinum compared to prior neck CT. The trachea, esophagus, and thyroid are unremarkable. Lungs/Pleura: There is a small spiculated sub solid nodule in the right upper lobe image 35, measuring 5 mm. No other pulmonary nodules or masses. Mild upper lobe predominant emphysema. Hypoventilatory changes at the lung bases. No large effusion or pneumothorax. Upper Abdomen: Please refer to recent abdominal CT describing numerous hepatic metastases as well as omental caking and trace ascites. Musculoskeletal: There are no acute or destructive bony lesions. Reconstructed images demonstrate no additional findings. IMPRESSION: 1. Nonspecific sub solid 5 mm right upper lobe pulmonary nodule. Neoplasm cannot be excluded in light of findings elsewhere within the chest and abdomen. 2. Extensive mediastinal lymphadenopathy worrisome for nodal metastases. 3. Aortic Atherosclerosis (ICD10-I70.0) and Emphysema (ICD10-J43.9). 4. Please refer to recent abdominal CT describing diffuse hepatic metastases. Electronically Signed   By: Randa Ngo M.D.   On: 10/30/2019 21:56   MR BRAIN W WO CONTRAST  Result Date: 10/31/2019 CLINICAL DATA:  Weakness, metastases EXAM: MRI HEAD WITHOUT AND WITH CONTRAST TECHNIQUE: Multiplanar, multiecho pulse sequences of the brain and surrounding structures were obtained without and with intravenous contrast. CONTRAST:  26m GADAVIST GADOBUTROL 1 MMOL/ML IV SOLN COMPARISON:  09/30/2018 FINDINGS: Motion artifact is present Brain: There is no acute  infarction or intracranial hemorrhage. There is no intracranial mass, mass effect, or edema. There is no hydrocephalus or extra-axial fluid collection. Ventricles and sulci are stable in size and configuration. Patchy T2 hyperintensity in the supratentorial and pontine white matter is nonspecific but probably reflects mild chronic microvascular ischemic changes. No abnormal enhancement. Vascular: Major vessel flow voids at the skull base are preserved. Skull and upper cervical spine: Normal marrow signal is preserved. There is susceptibility artifact related to partially imaged cervical spine fusion. Sinuses/Orbits: Paranasal sinuses are aerated. Orbits are unremarkable. Other: Sella is unremarkable.  Mastoid air cells are clear. IMPRESSION: Suboptimal evaluation due to motion artifact. No evidence of intracranial metastatic disease. No acute infarction or hemorrhage. Electronically Signed   By: PMacy MisM.D.   On: 10/31/2019 14:24   CT ABDOMEN PELVIS W CONTRAST  Result Date: 10/29/2019 CLINICAL DATA:  Hyperkalemia,, liver disease, emesis, weight loss EXAM: CT ABDOMEN AND PELVIS WITH CONTRAST TECHNIQUE: Multidetector CT imaging of the abdomen and pelvis was performed using the standard protocol following bolus administration of intravenous contrast. CONTRAST:  749mOMNIPAQUE IOHEXOL 300 MG/ML  SOLN COMPARISON:  06/17/2019, 01/05/2006 FINDINGS: Lower chest: No acute pleural or parenchymal lung disease. Hepatobiliary: There are innumerable masses replacing the majority of the liver parenchyma consistent with diffuse metastatic disease. The gallbladder is surgically absent. Pancreas: Unremarkable. No pancreatic ductal dilatation or surrounding inflammatory changes. Spleen: Normal in size without focal abnormality. Adrenals/Urinary Tract: Adrenal glands are unremarkable. Kidneys are normal, without renal calculi, focal lesion, or hydronephrosis. Bladder is unremarkable. Stomach/Bowel: No bowel obstruction or  ileus. No bowel wall thickening or inflammatory changes. Vascular/Lymphatic: There is pathologic adenopathy within the retroperitoneum. Largest pathologic lymph node in the left external iliac chain reference image 77 measures 3.5 x 3.1 cm. A second index lymph node at the right iliac bifurcation measures 2.6 x 2.2 cm reference image 62. There is minimal atherosclerosis of the aorta. Reproductive: Uterus is surgically absent. I do not see any adnexal lesions. Other: There is soft tissue fat stranding throughout the ventral omentum consistent with omental caking. Complex cystic mass within the left lower quadrant along the anterior abdominal wall measuring 5.7 x 7.2 cm is most consistent with a necrotic mass. Mural thickening along the anterior and lateral margin of the mass. There is trace free fluid within the upper abdomen surrounding the liver and spleen. No free intra-abdominal gas. Musculoskeletal: There are no acute or destructive bony lesions. Reconstructed images demonstrate no additional findings. IMPRESSION: 1. Diffuse hepatic metastases. 2. Omental caking. Complex cystic mass left lower quadrant consistent with necrotic lesion. 3. Pathologic retroperitoneal lymphadenopathy, most pronounced within the iliac distributions left greater than right. 4. Trace ascites. Electronically Signed   By: MiRanda Ngo.D.   On: 10/29/2019 22:15   [place]OPSY (LIVER)  Result Date: 10/31/2019 INDICATION: [age]ear old female with multifocal hepatic lesions concerning for metastatic disease. She presents for ultrasound-guided biopsy of the same. EXAM: ULTRASOUND BIOPSY CORE LIVER MEDICATIONS: None. ANESTHESIA/SEDATION: Moderate (conscious) sedation was employed during this procedure. A total of Versed 1 mg and Fentanyl 50 mcg was administered intravenously. Moderate Sedation Time: 13 minutes. The patient's level of consciousness and vital signs were monitored continuously by radiology nursing  throughout the procedure  under my direct supervision. FLUOROSCOPY TIME:  None. COMPLICATIONS: None immediate. PROCEDURE: Informed written consent was obtained from the patient after a thorough discussion of the procedural risks, benefits and alternatives. All questions were addressed. A timeout was performed prior to the initiation of the procedure. The liver was interrogated with ultrasound. There are innumerable hypoechoic solid lesions scattered throughout the liver. A suitable lesion in the left hepatic lobe was identified. The overlying skin was sterilely prepped and draped in the standard fashion. Local anesthesia was attained by infiltration with 1% lidocaine. A small dermatotomy was made. Under real-time ultrasound guidance, multiple 18 gauge core biopsies were obtained using the bio Pince automated biopsy device. Biopsies were performed coaxially through a 17 gauge introducer needle. As the introducer needle was withdrawn, the biopsy tract was embolized with a Gel-Foam slurry. Post ultrasound imaging demonstrates no evidence of immediate complication. IMPRESSION: Technically successful ultrasound-guided core biopsy of liver lesion. Electronically Signed   By: Jacqulynn Cadet M.D.   On: 10/31/2019 12:09   DG CHEST PORT 1 VIEW  Result Date: 11/05/2019 CLINICAL DATA:  Acute respiratory distress EXAM: PORTABLE CHEST 1 VIEW COMPARISON:  06/17/2019 FINDINGS: Single frontal view of the chest demonstrates an enlarged cardiac silhouette. Lung volumes are diminished. Mild increased interstitial prominence with central vascular congestion consistent with mild fluid overload. Small right pleural effusion. No pneumothorax. IMPRESSION: 1. Findings suggesting mild interstitial edema. Electronically Signed   By: Randa Ngo M.D.   On: 11/05/2019 22:51   DG Swallowing Func-Speech Pathology  Result Date: 11/06/2019 Objective Swallowing Evaluation: Type of Study: MBS-Modified Barium Swallow Study  Patient Details Name: TIMOTHEA BODENHEIMER  MRN: [id number] Date of Birth: [birth year] Today's Date: 11/06/2019 Time: SLP Start Time (ACUTE ONLY): 67 -SLP Stop Time (ACUTE ONLY): 1300 SLP Time Calculation (min) (ACUTE ONLY): 30 min Past Medical History: Past Medical History: Diagnosis Date . ADD (attention deficit disorder)  . Anxiety  . Arthritis   os . Breast cancer ([place])  . Diabetes mellitus without complication ([place])  . GERD (gastroesophageal reflux disease)   barretts . Headache  . High cholesterol  . Neck pain  . Obesity  . Personal history of radiation therapy 2005 . Sleep apnea   cpap   last sleep study> 3 yrs . Swelling of both ankles  . Weakness 06/2019 Past Surgical History: Past Surgical History: Procedure Laterality Date . ABDOMINAL HYSTERECTOMY   . ANTERIOR CERVICAL DECOMP/DISCECTOMY FUSION N/A 02/15/2016  Procedure: Cervical three four-Cervical five-six, Cervical six-seven Anterior cervical decompression/diskectomy/fusion;  Surgeon: Kristeen Miss, MD;  Location: [facility];  Service: Neurosurgery;  Laterality: N/A; . bicept surgery   . BREAST LUMPECTOMY   . CHOLECYSTECTOMY   . JOINT REPLACEMENT   . PAROTID GLAND TUMOR EXCISION   . TONSILLECTOMY   . TOTAL KNEE ARTHROPLASTY  07/19/2012  Procedure: TOTAL KNEE ARTHROPLASTY;  Surgeon: Yvette Rack., MD;  Location: [facility];  Service: Orthopedics;  Laterality: Right;  right total knee arthroplasty . TUBAL LIGATION   HPI: Pt is a 74 year old female admitted with medical decline and abnormal labs, found to have signs of malignant breast cancer with hepatic metastases. Admitted with subacute abdominal pain waxing waning over 3 months more acutely 3 weeks ago developed worsening weakness poor appetite nausea.  Has had difficulty swallowing with some question of a neck mass and thrust. CT shows Extensive mediastinal lymphadenopathy worrisome for nodal metastases.  No data recorded Assessment / Plan / Recommendation CHL IP CLINICAL IMPRESSIONS  11/06/2019 Clinical Impression  Pt demonstrates mild oropharyngeal  dysphagia, appearing primarily due to decreased awareness and mentation. Pt kept her eyes closed during session but followed commands as needed, When taking sips, there were instances of brief oral holding and also slight premature spillage. Airway closure was not complete at times during the swallow leading to instances of flash frank penetration. Typically no cough was elicited during these events, but as study progressed pt coughed more and mroe often, not appearing to be related to pharyngeal impairment. Esophageal sweep showed some distal residue and pill needed several bolus to fully clear. Pt is capable of regular solids and thin liquids but she should only eat and drink when fully alert. Will f/u for tolerance of diet and further modifications as needed.  SLP Visit Diagnosis Dysphagia, unspecified (R13.10) Attention and concentration deficit following -- Frontal lobe and executive function deficit following -- Impact on safety and function Mild aspiration risk;Risk for inadequate nutrition/hydration   CHL IP TREATMENT RECOMMENDATION 11/06/2019 Treatment Recommendations Therapy as outlined in treatment plan below   No flowsheet data found. CHL IP DIET RECOMMENDATION 11/06/2019 SLP Diet Recommendations -- Liquid Administration via Cup;Straw Medication Administration Whole meds with liquid Compensations -- Postural Changes Seated upright at 90 degrees;Remain semi-upright after after feeds/meals (Comment)   No flowsheet data found.  CHL IP FOLLOW UP RECOMMENDATIONS 11/06/2019 Follow up Recommendations 24 hour supervision/assistance   CHL IP FREQUENCY AND DURATION 11/06/2019 Speech Therapy Frequency (ACUTE ONLY) min 1 x/week Treatment Duration 1 week      CHL IP ORAL PHASE 11/06/2019 Oral Phase Impaired Oral - Pudding Teaspoon -- Oral - Pudding Cup -- Oral - Honey Teaspoon -- Oral - Honey Cup -- Oral - Nectar Teaspoon -- Oral - Nectar Cup -- Oral - Nectar Straw -- Oral - Thin Teaspoon Premature spillage;Decreased  bolus cohesion Oral - Thin Cup -- Oral - Thin Straw Delayed oral transit Oral - Puree Delayed oral transit Oral - Mech Soft -- Oral - Regular Delayed oral transit Oral - Multi-Consistency -- Oral - Pill -- Oral Phase - Comment --  CHL IP PHARYNGEAL PHASE 11/06/2019 Pharyngeal Phase Impaired Pharyngeal- Pudding Teaspoon -- Pharyngeal -- Pharyngeal- Pudding Cup -- Pharyngeal -- Pharyngeal- Honey Teaspoon -- Pharyngeal -- Pharyngeal- Honey Cup -- Pharyngeal -- Pharyngeal- Nectar Teaspoon -- Pharyngeal -- Pharyngeal- Nectar Cup -- Pharyngeal -- Pharyngeal- Nectar Straw -- Pharyngeal -- Pharyngeal- Thin Teaspoon Penetration/Aspiration before swallow;Penetration/Aspiration during swallow Pharyngeal Material enters airway, remains ABOVE vocal cords then ejected out Pharyngeal- Thin Cup -- Pharyngeal -- Pharyngeal- Thin Straw Penetration/Aspiration before swallow;Penetration/Aspiration during swallow Pharyngeal Material enters airway, CONTACTS cords and then ejected out;Material enters airway, remains ABOVE vocal cords then ejected out;Material does not enter airway Pharyngeal- Puree WFL Pharyngeal -- Pharyngeal- Mechanical Soft -- Pharyngeal -- Pharyngeal- Regular WFL Pharyngeal -- Pharyngeal- Multi-consistency -- Pharyngeal -- Pharyngeal- Pill WFL Pharyngeal -- Pharyngeal Comment --  No flowsheet data found. DeBlois, Katherene Ponto 11/06/2019, 2:48 PM              VAS [place] UPPER EXTREMITY VENOUS DUPLEX  Result Date: 11/07/2019 UPPER VENOUS STUDY  Indications: Edema Limitations: Body habitus, poor ultrasound/tissue interface and patient unable to cooperate. Patient position. Comparison Study: No prior study Performing Technologist: Maudry Mayhew MHA, RDMS, RVT, RDCS  Examination Guidelines: A complete evaluation includes B-mode imaging, spectral Doppler, color Doppler, and power Doppler as needed of all accessible portions of each vessel. Bilateral testing is considered an integral part of a complete examination.  Limited examinations for reoccurring  indications may be performed as noted.  Right Findings: +----------+------------+---------+-----------+----------+--------------+ RIGHT     CompressiblePhasicitySpontaneousProperties   Summary     +----------+------------+---------+-----------+----------+--------------+ Subclavian                                          Not visualized +----------+------------+---------+-----------+----------+--------------+  Left Findings: +----------+------------+---------+-----------+----------+--------------+ LEFT      CompressiblePhasicitySpontaneousProperties   Summary     +----------+------------+---------+-----------+----------+--------------+ IJV           Full       Yes       Yes                             +----------+------------+---------+-----------+----------+--------------+ Subclavian    Full       Yes       Yes                             +----------+------------+---------+-----------+----------+--------------+ Axillary      Full       Yes       Yes                             +----------+------------+---------+-----------+----------+--------------+ Brachial      Full       Yes       Yes                             +----------+------------+---------+-----------+----------+--------------+ Radial        Full                                                 +----------+------------+---------+-----------+----------+--------------+ Ulnar                                               Not visualized +----------+------------+---------+-----------+----------+--------------+ Cephalic      Full                                                 +----------+------------+---------+-----------+----------+--------------+ Basilic                                             Not visualized +----------+------------+---------+-----------+----------+--------------+  Summary:  Left: No evidence of deep vein thrombosis in the  upper extremity. No evidence of superficial vein thrombosis in the upper extremity. This study was limited due to technical limitations listed above.  *See table(s) above for measurements and observations.  Diagnosing physician: Servando Snare MD Electronically signed by Servando Snare MD on 11/07/2019 at 3:03:30 PM.    Final    IR PICC PLACEMENT RIGHT >5 YRS INC IMG GUIDE  Result Date: 11/10/2019 INDICATION: Patient with history of likely primary peritoneal carcinoma, poor venous access ; central venous access requested  for chemotherapy. EXAM: ULTRASOUND AND FLUOROSCOPIC GUIDED PICC LINE INSERTION MEDICATIONS: 1% lidocaine to skin and subcutaneous tissue ANESTHESIA/SEDATION: None FLUOROSCOPY TIME:  Fluoroscopy Time:  42 seconds (7 mGy). COMPLICATIONS: None immediate. TECHNIQUE: The procedure, risks, benefits, and alternatives were explained to the patient and informed written consent was obtained. A timeout was performed prior to the initiation of the procedure. The right upper extremity was prepped with chlorhexidine in a sterile fashion, and a sterile drape was applied covering the operative field. Maximum barrier sterile technique with sterile gowns and gloves were used for the procedure. A timeout was performed prior to the initiation of the procedure. Local anesthesia was provided with 1% lidocaine. Under direct ultrasound guidance, the right brachial vein was accessed with a micropuncture kit after the overlying soft tissues were anesthetized with 1% lidocaine. An ultrasound image was saved for documentation purposes. A guidewire was advanced to the level of the superior caval-atrial junction for measurement purposes and the PICC line was cut to length. A peel-away sheath was placed and a 39 cm, 5 [place], dual lumen was inserted to level of the superior caval-atrial junction. A post procedure spot fluoroscopic was obtained. The catheter easily aspirated and flushed and was sutured in place. A dressing was  placed. The patient tolerated the procedure well without immediate post procedural complication. FINDINGS: After catheter placement, the tip lies within the superior cavoatrial junction the catheter aspirates and flushes normally and is ready for immediate use. IMPRESSION: Successful ultrasound and fluoroscopic guided placement of a right brachial vein approach, 39 cm, 5 French, dual lumen PICC with tip at the superior caval-atrial junction. The PICC line is ready for immediate use. Read by: Rowe Robert, PA-C Electronically Signed   By: Aletta Edouard M.D.   On: 11/10/2019 13:39   [place] EKG SITE RITE  Result Date: 11/07/2019 If Site Rite image not attached, placement could not be confirmed due to current cardiac rhythm.  US Abdomen Limited RUQ  Result Date: 11/14/2019 CLINICAL DATA:  74 year old female with transaminitis. History of breast cancer. EXAM: ULTRASOUND ABDOMEN LIMITED RIGHT UPPER QUADRANT COMPARISON:  CT of the abdomen pelvis dated 10/29/2019. FINDINGS: Gallbladder: Cholecystectomy. Common bile duct: Poorly visualized. Liver: The liver demonstrates a heterogeneous echotexture with innumerable hypoechoic lesions consistent with metastatic disease. 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 Portal vein is patent on color Doppler imaging with normal direction of blood flow towards the liver. Other: There is a small perihepatic ascites. Moderate right renal atrophy. IMPRESSION: 1. Extensive hepatic metastatic disease. 2. Small perihepatic ascites. 3. Patent main portal vein with hepatopetal flow. Electronically Signed   By: Anner Crete M.D.   On: 11/14/2019 16:37    ASSESSMENT AND PLAN: 1.    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  Primary peritoneal carcinoma favored given history of  hysterectomy with negative pathology.  -10/29/2019 CEA 4.8, CA 19.9 441, AFP 2.1   -10/30/2019 CA 27.29 6728  -11/06/2019 CA-125 14,072  Cycle 1 Taxol/carboplatin 11/11/2019 2.  Elevated total bilirubin and LFTs 3.  AKI secondary to dehydration 4.  Hypercalcemia-status post Zometa and calcitonin, resolved 5.  Deconditioning 6.  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  Diabetes mellitus 8.  Temporal arteritis, status post treatment 9.  Migraine headaches 10.  Leg weakness-steroid myopathy?,  Paraneoplastic syndrome? 11.  Pancytopenia secondary to chemotherapy  Ms Chalupa is now at day 6 following cycle 1 Taxol/carboplatin.  She tolerated the chemotherapy without significant acute toxicity.  The platelet and neutrophil counts are pending today.  She appears to be developing cytopenias secondary to chemotherapy.  We will recommend starting broad-spectrum antibiotics for a fever spike or progressive erythema at the abdominal wall.  The anasarca has improved and her mouth is dry.  We should decrease the diuresis.  Recommendations: 1.  Decrease diuretic dose 2.  Continue physical therapy, out of bed as tolerated, consider evaluation for inpatient rehabilitation stay 3.  Follow-up platelet count and WBC differential from today 4.  Start broad-spectrum intravenous antibiotics and obtain blood cultures for a fever spike    LOS: 18 days   Betsy Coder 11/17/19

## 2019-11-17 NOTE — Progress Notes (Signed)
Pharmacy Antibiotic Note  [name] is a 74 y.o. 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 Given fever and neutropenia today, patient being started on broad spectrum antibiotics. Pharmacy has been consulted for Vancomycin and Cefepime dosing.  Plan: Vancomycin 2500mg  IV x 1, then 1g IV q12h Cefepime 2g IV q8h Monitor renal function, cultures, clinical course  Height: 5\' 4"  (162.6 cm) Weight: 123.4 kg (272 lb) IBW/kg (Calculated) : 54.7  Temp (24hrs), Avg:99.4 F (37.4 C), Min:98.9 F (37.2 C), Max:100.1 F (37.8 C)  Recent Labs  Lab 11/12/19 0336 11/12/19 0336 11/13/19 0500 11/14/19 0705 11/15/19 0557 11/16/19 0559 11/17/19 0634 11/17/19 1026  WBC 17.5*  --  14.8* 18.2* 17.4*  --   --  0.9*  CREATININE 0.56   < > 0.90 0.67 0.68 0.70 0.67  --    < > = values in this interval not displayed.    Estimated Creatinine Clearance: 82.5 mL/min (by C-G formula based on SCr of 0.67 mg/dL).    Allergies  Allergen Reactions  . Nickel Hives and Rash    Antimicrobials this admission: 5/15 Fluconazole x 1 5/31 Vancomycin >> 5/31 Cefepime >>  Dose adjustments this admission: --  Microbiology results: 5/12 COVID: negative 5/25 UCx: > 100K Proteus vulgaris (R ampicillin, cefazolin, nitrofurantoin), E.coli (pan-sensitive)  5/31 BCx: ordered  Thank you for allowing pharmacy to be a part of this patient's care.   Lindell Spar, PharmD, BCPS Clinical Pharmacist  11/17/2019 2:41 PM

## 2019-11-17 NOTE — Progress Notes (Signed)
CRITICAL VALUE ALERT  Critical Value:  Lacric Acid 6.6  Date & Time Notied:  5/31 1905  Provider Notified: Ardith Dark  Orders Received/Actions taken: RR called, see new orders

## 2019-11-17 NOTE — Progress Notes (Signed)
Spoke with M. Sharlet Salina NP at patient bedside. She is aware of critical lab result of Lactic acid at 7.0. Decision was made to call family to bedside. Pt daughter Jinny Blossom notified via phone. Message left for patient husband.

## 2019-11-17 NOTE — Progress Notes (Signed)
Daily Progress Note   Patient Name: [name]       Date: 11/17/2019 DOB: 07-19-1945  Age: 74 y.o. MRN#: [id number] Attending Physician: Harold Hedge, MD Primary Care Physician: Leonides Sake, MD Admit Date: 10/29/2019  Reason for Consultation/Follow-up: To discuss complex medical decision making related to patient's goals of care  Called by Dr. Neysa Bonito to revisit.  Patient doing poorly and husband has questions about potentially shifting goals of care.     WBC dropped from 17 on 5/29 to < 1.0 today.  U/A positive for UTI.    Subjective: Spoke with husband [name] on the phone.  [name] feels as though his wife has not improved PO intake, not improved functionality, and will to live has not improved.  He feels like chemo may be having a strong impact on [name] but thus far he has not seen positive effects.  We discussed shifting to comfort and transferring to [facility]. I explained what comfort measures are - pain/anxiety/dyspnea are treated, life prolonging interventions are not provided.  Patient will be offered meals.    I offered a prognosis of days to weeks if the goal shifted to comfort.  [name] suggested that [name]'s prognosis may be not too much longer even if we did not shift to comfort.  [name] wanted to talk with his son and daughter.  He suggested that a meeting in the hospital together would be helpful in order to make decisions.  [name] also confided that he is trying to get his wife's affairs in order before final decisions are made.  Family meeting with PMT offered. We will touch base with [name] again tomorrow.  After speaking with [name] I spoke with [name].  She is lethargic but gives me delayed appropriate responses.   I ask how do you feel?  She responds -  not so good.  I  asked what do you think of chemotherapy?  She responds -  I'm thinking of stopping it. She is unable to tell me much more and simply stares straight ahead.  Assessment:  Metastatic cancer with omental caking - presumed to be peritoneal cancer.   Patient eating very poorly - but will drink ensure with encouragement from her family.  Lethargic.  Patient now appears septic with WBC < 1.0 and lactic acid greater than  6.0.  Vanc and cefepime started.  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 Recently getting extremely debilitated and unable to walk since last 2 weeks. In the emergency room, hemodynamically stable. Clinically dehydrated. CT scan abdomen pelvis shows extensive hepatic metastatic disease, omental caking and complex cyst left lower quadrant, multiple lymphadenopathy.    Length of Stay: 18   Vital Signs: BP (!) 100/56 (BP Location: Left Arm)   Pulse 100   Temp 100.1 F (37.8 C) (Oral)   Resp 16   Ht 5\' 4"  (1.626 m)   Wt 123.4 kg   SpO2 95%   BMI 46.69 kg/m  SpO2: SpO2: 95 % O2 Device: O2 Device: Room Air O2 Flow Rate:         Palliative Assessment/Data: 20%     Palliative Care Plan    Recommendations/Plan:  PMT family meeting to be held to reassess goals of care.  PMT will touch base with [name] to determine a time.  Recommend a shift to comfort measures and hospice facility at this point.  Code Status:  DNR  Prognosis:  Days to weeks secondary to acute sepsis with lactic of > 6.0, metastatic peritoneal carcinomatosis, drastic drop in functional status  - now bedbound with very poor PO intake.  She has had AMS since admission 19 days ago.  Discharge Planning:  To Be  Determined.    Care plan was discussed with Drs. Neysa Bonito and [place], husband.  Thank you for allowing the Palliative Medicine Team to assist in the care of this patient.  Total time spent:  60 min.     Greater than 50%  of this time was spent counseling and coordinating care related to the above assessment and plan.  Florentina Jenny, PA-C Palliative Medicine  Please contact Palliative MedicineTeam phone at [phone] for questions and concerns between 7 am - 7 pm.   Please see AMION for individual provider pager numbers.

## 2019-11-17 NOTE — Progress Notes (Addendum)
Assigned RN called in lactic acid of 6.6 to house coverage, also noted Pt to be more lethargic than previous shift she had Pt.  RR was called per order. VSS with the exception of ST 126. House coverage & RR coordinated move to stepdown. Assigned RN called family to notify of change.

## 2019-11-18 DIAGNOSIS — D701 Agranulocytosis secondary to cancer chemotherapy: Secondary | ICD-10-CM

## 2019-11-18 DIAGNOSIS — R6521 Severe sepsis with septic shock: Secondary | ICD-10-CM

## 2019-11-18 DIAGNOSIS — D61818 Other pancytopenia: Secondary | ICD-10-CM

## 2019-11-18 DIAGNOSIS — T451X5A Adverse effect of antineoplastic and immunosuppressive drugs, initial encounter: Secondary | ICD-10-CM

## 2019-11-18 DIAGNOSIS — A419 Sepsis, unspecified organism: Secondary | ICD-10-CM

## 2019-11-18 LAB — CBC WITH DIFFERENTIAL/PLATELET
Abs Immature Granulocytes: 0.03 10*3/uL (ref 0.00–0.07)
Basophils Absolute: 0 10*3/uL (ref 0.0–0.1)
Basophils Relative: 1 %
Eosinophils Absolute: 0 10*3/uL (ref 0.0–0.5)
Eosinophils Relative: 0 %
HCT: 22.2 % — ABNORMAL LOW (ref 36.0–46.0)
Hemoglobin: 6.8 g/dL — CL (ref 12.0–15.0)
Immature Granulocytes: 4 %
Lymphocytes Relative: 53 %
Lymphs Abs: 0.4 10*3/uL — ABNORMAL LOW (ref 0.7–4.0)
MCH: 32.4 pg (ref 26.0–34.0)
MCHC: 30.6 g/dL (ref 30.0–36.0)
MCV: 105.7 fL — ABNORMAL HIGH (ref 80.0–100.0)
Monocytes Absolute: 0.1 10*3/uL (ref 0.1–1.0)
Monocytes Relative: 15 %
Neutro Abs: 0.2 10*3/uL — ABNORMAL LOW (ref 1.7–7.7)
Neutrophils Relative %: 27 %
Platelets: 63 10*3/uL — ABNORMAL LOW (ref 150–400)
RBC: 2.1 MIL/uL — ABNORMAL LOW (ref 3.87–5.11)
RDW: 21.2 % — ABNORMAL HIGH (ref 11.5–15.5)
WBC: 0.8 10*3/uL — CL (ref 4.0–10.5)
nRBC: 4 % — ABNORMAL HIGH (ref 0.0–0.2)

## 2019-11-18 LAB — BASIC METABOLIC PANEL
Anion gap: 16 — ABNORMAL HIGH (ref 5–15)
BUN: 43 mg/dL — ABNORMAL HIGH (ref 8–23)
CO2: 20 mmol/L — ABNORMAL LOW (ref 22–32)
Calcium: 8 mg/dL — ABNORMAL LOW (ref 8.9–10.3)
Chloride: 107 mmol/L (ref 98–111)
Creatinine, Ser: 0.86 mg/dL (ref 0.44–1.00)
GFR calc Af Amer: >60 mL/min (ref >60)
GFR calc non Af Amer: >60 mL/min (ref >60)
Glucose, Bld: 132 mg/dL — ABNORMAL HIGH (ref 70–99)
Potassium: 3.2 mmol/L — ABNORMAL LOW (ref 3.5–5.1)
Sodium: 143 mmol/L (ref 135–145)

## 2019-11-18 LAB — MAGNESIUM: Magnesium: 1.7 mg/dL (ref 1.7–2.4)

## 2019-11-18 LAB — ABO/RH: ABO/RH(D): O NEG

## 2019-11-18 LAB — LACTIC ACID, PLASMA
Lactic Acid, Venous: 5.5 mmol/L (ref 0.5–1.9)
Lactic Acid, Venous: 5.2 mmol/L (ref 0.5–1.9)

## 2019-11-18 LAB — PREPARE RBC (CROSSMATCH)

## 2019-11-18 MED ORDER — ALBUMIN HUMAN 5 % IV SOLN
12.5000 g | Freq: Once | INTRAVENOUS | Status: AC
  Administered 2019-11-18: 12.5 g via INTRAVENOUS
  Filled 2019-11-18: qty 250

## 2019-11-18 MED ORDER — SODIUM CHLORIDE 0.9 % IV BOLUS
1000.0000 mL | Freq: Once | INTRAVENOUS | Status: AC
  Administered 2019-11-18: 1000 mL via INTRAVENOUS

## 2019-11-18 MED ORDER — ALBUMIN HUMAN 25 % IV SOLN
12.5000 g | INTRAVENOUS | Status: AC
  Administered 2019-11-18 (×2): 12.5 g via INTRAVENOUS
  Filled 2019-11-18: qty 50

## 2019-11-18 MED ORDER — SODIUM CHLORIDE 0.9% IV SOLUTION: Freq: Once | INTRAVENOUS | Status: DC

## 2019-11-18 MED ORDER — TBO-FILGRASTIM 480 MCG/0.8ML ~~LOC~~ SOSY
480.0000 ug | PREFILLED_SYRINGE | Freq: Every day | SUBCUTANEOUS | Status: DC
  Administered 2019-11-18: 480 ug via SUBCUTANEOUS
  Filled 2019-11-18: qty 0.8

## 2019-11-18 NOTE — TOC Transition Note (Signed)
Transition of Care [facility]) - CM/SW Discharge Note   Patient Details  Name: DAFFNEY COLL MRN: [id number] Date of Birth: [birth year]  [facility] CM/SW Contact:  Lynnell Catalan, RN Phone Number: 11/18/2019, 11:20 AM   Clinical Narrative:    Per [place] pt SNF auth runs out on 6/3. If pt to dc after that date, will need to restart SNF auth.   Final next level of care: Skilled Nursing Facility Barriers to Discharge: Continued Medical Work up   Patient Goals and CMS Choice   CMS Medicare.gov Compare Post Acute Care list provided to:: Patient Choice offered to / list presented to : Patient   Readmission Risk Interventions Readmission Risk Prevention Plan 11/10/2019  Transportation Screening Complete  PCP or Specialist Appt within 3-5 Days Complete  HRI or Home Care Consult Complete  Social Work Consult for [place] Planning/Counseling Complete  Palliative Care Screening Complete  Medication Review [employer]) Complete  Some recent data might be hidden

## 2019-11-18 NOTE — Progress Notes (Addendum)
HEMATOLOGY-ONCOLOGY PROGRESS NOTE  SUBJECTIVE: Transferred to the ICU last evening secondary to hypotension.  No family at the bedside this morning.  The patient is more somnolent but does awaken to answer questions.  IV fluid bolus in process.  Still with significant anasarca and weeping particularly to the left arm.  PHYSICAL EXAMINATION:  Vitals:   11/18/19 0800 11/18/19 0830  BP: (!) 79/40 (!) 95/46  Pulse: (!) 107 (!) 107  Resp: 17 18  Temp: 98.4 F (36.9 C)   SpO2: 93% 96%   Filed Weights   11/07/19 2041 11/08/19 2139 11/09/19 1609  Weight: 120.1 kg 120.1 kg 123.4 kg    Intake/Output from previous day: 05/31 0701 - 06/01 0700 In: 240 [P.O.:240] Out: 750 [Urine:750] HEENT: No thrush Lungs: Clear anteriorly Cardiac: Regular, anasarca in all extremities ABDOMEN: Soft, liver edge palpable in the upper abdomen, mild right upper abdomen tenderness, erythema at the right and lower abdomen pannus-improved from 11/17/2019 NEURO: Alert, not oriented to year.  Oriented to diagnosis Vascular: Pitting edema to the bilateral upper and lower extremities  PICC without erythema or drainage  LABORATORY DATA:  I have reviewed the data as listed CMP Latest Ref Rng & Units 11/18/2019 11/17/2019 11/16/2019  Glucose 70 - 99 mg/dL 132(H) 139(H) 129(H)  BUN 8 - 23 mg/dL 43(H) 37(H) 39(H)  Creatinine 0.44 - 1.00 mg/dL 0.86 0.67 0.70  Sodium 135 - 145 mmol/L 143 142 144  Potassium 3.5 - 5.1 mmol/L 3.2(L) 3.4(L) 3.9  Chloride 98 - 111 mmol/L 107 105 109  CO2 22 - 32 mmol/L 20(L) 25 23  Calcium 8.9 - 10.3 mg/dL 8.0(L) 8.3(L) 8.4(L)  Total Protein 6.5 - 8.1 g/dL - 4.5(L) 4.9(L)  Total Bilirubin 0.3 - 1.2 mg/dL - 4.4(H) 4.4(H)  Alkaline Phos 38 - 126 U/L - 995(H) 1,185(H)  AST 15 - 41 U/L - 181(H) 197(H)  ALT 0 - 44 U/L - 62(H) 68(H)    Lab Results  Component Value Date   WBC 0.8 (LL) 11/18/2019   HGB 6.8 (LL) 11/18/2019   HCT 22.2 (L) 11/18/2019   MCV 105.7 (H) 11/18/2019   PLT 63 (L)  11/18/2019   NEUTROABS 0.2 (L) 11/18/2019    CT CHEST WO CONTRAST  Result Date: 10/30/2019 CLINICAL DATA:  Metastatic malignancy EXAM: CT CHEST WITHOUT CONTRAST TECHNIQUE: Multidetector CT imaging of the chest was performed following the standard protocol without IV contrast. COMPARISON:  10/29/2019, 06/18/2019 FINDINGS: Cardiovascular: No pericardial effusion. Normal caliber of the thoracic aorta. Minimal atherosclerosis. Mediastinum/Nodes: Numerous enlarged mediastinal lymph nodes are identified. Index lymph node in the right paratracheal region measures 21 x 21 mm, previous having measured 27 x 22 mm. Increased number of enlarged lymph nodes throughout the mediastinum compared to prior neck CT. The trachea, esophagus, and thyroid are unremarkable. Lungs/Pleura: There is a small spiculated sub solid nodule in the right upper lobe image 35, measuring 5 mm. No other pulmonary nodules or masses. Mild upper lobe predominant emphysema. Hypoventilatory changes at the lung bases. No large effusion or pneumothorax. Upper Abdomen: Please refer to recent abdominal CT describing numerous hepatic metastases as well as omental caking and trace ascites. Musculoskeletal: There are no acute or destructive bony lesions. Reconstructed images demonstrate no additional findings. IMPRESSION: 1. Nonspecific sub solid 5 mm right upper lobe pulmonary nodule. Neoplasm cannot be excluded in light of findings elsewhere within the chest and abdomen. 2. Extensive mediastinal lymphadenopathy worrisome for nodal metastases. 3. Aortic Atherosclerosis (ICD10-I70.0) and Emphysema (ICD10-J43.9). 4. Please refer to  recent abdominal CT describing diffuse hepatic metastases. Electronically Signed   By: Randa Ngo M.D.   On: 10/30/2019 21:56   MR BRAIN W WO CONTRAST  Result Date: 10/31/2019 CLINICAL DATA:  Weakness, metastases EXAM: MRI HEAD WITHOUT AND WITH CONTRAST TECHNIQUE: Multiplanar, multiecho pulse sequences of the brain and  surrounding structures were obtained without and with intravenous contrast. CONTRAST:  71m GADAVIST GADOBUTROL 1 MMOL/ML IV SOLN COMPARISON:  09/30/2018 FINDINGS: Motion artifact is present Brain: There is no acute infarction or intracranial hemorrhage. There is no intracranial mass, mass effect, or edema. There is no hydrocephalus or extra-axial fluid collection. Ventricles and sulci are stable in size and configuration. Patchy T2 hyperintensity in the supratentorial and pontine white matter is nonspecific but probably reflects mild chronic microvascular ischemic changes. No abnormal enhancement. Vascular: Major vessel flow voids at the skull base are preserved. Skull and upper cervical spine: Normal marrow signal is preserved. There is susceptibility artifact related to partially imaged cervical spine fusion. Sinuses/Orbits: Paranasal sinuses are aerated. Orbits are unremarkable. Other: Sella is unremarkable.  Mastoid air cells are clear. IMPRESSION: Suboptimal evaluation due to motion artifact. No evidence of intracranial metastatic disease. No acute infarction or hemorrhage. Electronically Signed   By: PMacy MisM.D.   On: 10/31/2019 14:24   CT ABDOMEN PELVIS W CONTRAST  Result Date: 10/29/2019 CLINICAL DATA:  Hyperkalemia,, liver disease, emesis, weight loss EXAM: CT ABDOMEN AND PELVIS WITH CONTRAST TECHNIQUE: Multidetector CT imaging of the abdomen and pelvis was performed using the standard protocol following bolus administration of intravenous contrast. CONTRAST:  754mOMNIPAQUE IOHEXOL 300 MG/ML  SOLN COMPARISON:  06/17/2019, 01/05/2006 FINDINGS: Lower chest: No acute pleural or parenchymal lung disease. Hepatobiliary: There are innumerable masses replacing the majority of the liver parenchyma consistent with diffuse metastatic disease. The gallbladder is surgically absent. Pancreas: Unremarkable. No pancreatic ductal dilatation or surrounding inflammatory changes. Spleen: Normal in size without  focal abnormality. Adrenals/Urinary Tract: Adrenal glands are unremarkable. Kidneys are normal, without renal calculi, focal lesion, or hydronephrosis. Bladder is unremarkable. Stomach/Bowel: No bowel obstruction or ileus. No bowel wall thickening or inflammatory changes. Vascular/Lymphatic: There is pathologic adenopathy within the retroperitoneum. Largest pathologic lymph node in the left external iliac chain reference image 77 measures 3.5 x 3.1 cm. A second index lymph node at the right iliac bifurcation measures 2.6 x 2.2 cm reference image 62. There is minimal atherosclerosis of the aorta. Reproductive: Uterus is surgically absent. I do not see any adnexal lesions. Other: There is soft tissue fat stranding throughout the ventral omentum consistent with omental caking. Complex cystic mass within the left lower quadrant along the anterior abdominal wall measuring 5.7 x 7.2 cm is most consistent with a necrotic mass. Mural thickening along the anterior and lateral margin of the mass. There is trace free fluid within the upper abdomen surrounding the liver and spleen. No free intra-abdominal gas. Musculoskeletal: There are no acute or destructive bony lesions. Reconstructed images demonstrate no additional findings. IMPRESSION: 1. Diffuse hepatic metastases. 2. Omental caking. Complex cystic mass left lower quadrant consistent with necrotic lesion. 3. Pathologic retroperitoneal lymphadenopathy, most pronounced within the iliac distributions left greater than right. 4. Trace ascites. Electronically Signed   By: MiRanda Ngo.D.   On: 10/29/2019 22:15   [place]OPSY (LIVER)  Result Date: 10/31/2019 INDICATION: [age]ear old female with multifocal hepatic lesions concerning for metastatic disease. She presents for ultrasound-guided biopsy of the same. EXAM: ULTRASOUND BIOPSY CORE LIVER MEDICATIONS: None. ANESTHESIA/SEDATION: Moderate (conscious) sedation was employed  during this procedure. A total of Versed 1 mg  and Fentanyl 50 mcg was administered intravenously. Moderate Sedation Time: 13 minutes. The patient's level of consciousness and vital signs were monitored continuously by radiology nursing throughout the procedure under my direct supervision. FLUOROSCOPY TIME:  None. COMPLICATIONS: None immediate. PROCEDURE: Informed written consent was obtained from the patient after a thorough discussion of the procedural risks, benefits and alternatives. All questions were addressed. A timeout was performed prior to the initiation of the procedure. The liver was interrogated with ultrasound. There are innumerable hypoechoic solid lesions scattered throughout the liver. A suitable lesion in the left hepatic lobe was identified. The overlying skin was sterilely prepped and draped in the standard fashion. Local anesthesia was attained by infiltration with 1% lidocaine. A small dermatotomy was made. Under real-time ultrasound guidance, multiple 18 gauge core biopsies were obtained using the bio Pince automated biopsy device. Biopsies were performed coaxially through a 17 gauge introducer needle. As the introducer needle was withdrawn, the biopsy tract was embolized with a Gel-Foam slurry. Post ultrasound imaging demonstrates no evidence of immediate complication. IMPRESSION: Technically successful ultrasound-guided core biopsy of liver lesion. Electronically Signed   By: Jacqulynn Cadet M.D.   On: 10/31/2019 12:09   DG CHEST PORT 1 VIEW  Result Date: 11/05/2019 CLINICAL DATA:  Acute respiratory distress EXAM: PORTABLE CHEST 1 VIEW COMPARISON:  06/17/2019 FINDINGS: Single frontal view of the chest demonstrates an enlarged cardiac silhouette. Lung volumes are diminished. Mild increased interstitial prominence with central vascular congestion consistent with mild fluid overload. Small right pleural effusion. No pneumothorax. IMPRESSION: 1. Findings suggesting mild interstitial edema. Electronically Signed   By: Randa Ngo  M.D.   On: 11/05/2019 22:51   DG Swallowing Func-Speech Pathology  Result Date: 11/06/2019 Objective Swallowing Evaluation: Type of Study: MBS-Modified Barium Swallow Study  Patient Details Name: [name] MRN: [id number] Date of Birth: 11-15-1945 Today's Date: 11/06/2019 Time: SLP Start Time (ACUTE ONLY): 23 -SLP Stop Time (ACUTE ONLY): 1300 SLP Time Calculation (min) (ACUTE ONLY): 30 min Past Medical History: Past Medical History: Diagnosis Date . ADD (attention deficit disorder)  . Anxiety  . Arthritis   os . Breast cancer ([place])  . Diabetes mellitus without complication ([place])  . GERD (gastroesophageal reflux disease)   barretts . Headache  . High cholesterol  . Neck pain  . Obesity  . Personal history of radiation therapy 2005 . Sleep apnea   cpap   last sleep study> 3 yrs . Swelling of both ankles  . Weakness 06/2019 Past Surgical History: Past Surgical History: Procedure Laterality Date . ABDOMINAL HYSTERECTOMY   . ANTERIOR CERVICAL DECOMP/DISCECTOMY FUSION N/A 02/15/2016  Procedure: Cervical three four-Cervical five-six, Cervical six-seven Anterior cervical decompression/diskectomy/fusion;  Surgeon: Kristeen Miss, MD;  Location: [facility];  Service: Neurosurgery;  Laterality: N/A; . bicept surgery   . BREAST LUMPECTOMY   . CHOLECYSTECTOMY   . JOINT REPLACEMENT   . PAROTID GLAND TUMOR EXCISION   . TONSILLECTOMY   . TOTAL KNEE ARTHROPLASTY  07/19/2012  Procedure: TOTAL KNEE ARTHROPLASTY;  Surgeon: Yvette Rack., MD;  Location: [place];  Service: Orthopedics;  Laterality: Right;  right total knee arthroplasty . TUBAL LIGATION   HPI: Pt is a 74 year old female admitted with medical decline and abnormal labs, found to have signs of malignant breast cancer with hepatic metastases. Admitted with subacute abdominal pain waxing waning over 3 months more acutely 3 weeks ago developed worsening weakness poor appetite nausea.  Has had difficulty swallowing with some question of a neck mass and thrust. CT shows  Extensive mediastinal lymphadenopathy worrisome for nodal metastases.  No data recorded Assessment / Plan / Recommendation CHL IP CLINICAL IMPRESSIONS 11/06/2019 Clinical Impression  Pt demonstrates mild oropharyngeal dysphagia, appearing primarily due to decreased awareness and mentation. Pt kept her eyes closed during session but followed commands as needed, When taking sips, there were instances of brief oral holding and also slight premature spillage. Airway closure was not complete at times during the swallow leading to instances of flash frank penetration. Typically no cough was elicited during these events, but as study progressed pt coughed more and mroe often, not appearing to be related to pharyngeal impairment. Esophageal sweep showed some distal residue and pill needed several bolus to fully clear. Pt is capable of regular solids and thin liquids but she should only eat and drink when fully alert. Will f/u for tolerance of diet and further modifications as needed.  SLP Visit Diagnosis Dysphagia, unspecified (R13.10) Attention and concentration deficit following -- Frontal lobe and executive function deficit following -- Impact on safety and function Mild aspiration risk;Risk for inadequate nutrition/hydration   CHL IP TREATMENT RECOMMENDATION 11/06/2019 Treatment Recommendations Therapy as outlined in treatment plan below   No flowsheet data found. CHL IP DIET RECOMMENDATION 11/06/2019 SLP Diet Recommendations -- Liquid Administration via Cup;Straw Medication Administration Whole meds with liquid Compensations -- Postural Changes Seated upright at 90 degrees;Remain semi-upright after after feeds/meals (Comment)   No flowsheet data found.  CHL IP FOLLOW UP RECOMMENDATIONS 11/06/2019 Follow up Recommendations 24 hour supervision/assistance   CHL IP FREQUENCY AND DURATION 11/06/2019 Speech Therapy Frequency (ACUTE ONLY) min 1 x/week Treatment Duration 1 week      CHL IP ORAL PHASE 11/06/2019 Oral Phase Impaired  Oral - Pudding Teaspoon -- Oral - Pudding Cup -- Oral - Honey Teaspoon -- Oral - Honey Cup -- Oral - Nectar Teaspoon -- Oral - Nectar Cup -- Oral - Nectar Straw -- Oral - Thin Teaspoon Premature spillage;Decreased bolus cohesion Oral - Thin Cup -- Oral - Thin Straw Delayed oral transit Oral - Puree Delayed oral transit Oral - Mech Soft -- Oral - Regular Delayed oral transit Oral - Multi-Consistency -- Oral - Pill -- Oral Phase - Comment --  CHL IP PHARYNGEAL PHASE 11/06/2019 Pharyngeal Phase Impaired Pharyngeal- Pudding Teaspoon -- Pharyngeal -- Pharyngeal- Pudding Cup -- Pharyngeal -- Pharyngeal- Honey Teaspoon -- Pharyngeal -- Pharyngeal- Honey Cup -- Pharyngeal -- Pharyngeal- Nectar Teaspoon -- Pharyngeal -- Pharyngeal- Nectar Cup -- Pharyngeal -- Pharyngeal- Nectar Straw -- Pharyngeal -- Pharyngeal- Thin Teaspoon Penetration/Aspiration before swallow;Penetration/Aspiration during swallow Pharyngeal Material enters airway, remains ABOVE vocal cords then ejected out Pharyngeal- Thin Cup -- Pharyngeal -- Pharyngeal- Thin Straw Penetration/Aspiration before swallow;Penetration/Aspiration during swallow Pharyngeal Material enters airway, CONTACTS cords and then ejected out;Material enters airway, remains ABOVE vocal cords then ejected out;Material does not enter airway Pharyngeal- Puree WFL Pharyngeal -- Pharyngeal- Mechanical Soft -- Pharyngeal -- Pharyngeal- Regular WFL Pharyngeal -- Pharyngeal- Multi-consistency -- Pharyngeal -- Pharyngeal- Pill WFL Pharyngeal -- Pharyngeal Comment --  No flowsheet data found. DeBlois, Katherene Ponto 11/06/2019, 2:48 PM              VAS [place] UPPER EXTREMITY VENOUS DUPLEX  Result Date: 11/07/2019 UPPER VENOUS STUDY  Indications: Edema Limitations: Body habitus, poor ultrasound/tissue interface and patient unable to cooperate. Patient position. Comparison Study: No prior study Performing Technologist: Maudry Mayhew MHA, RDMS, RVT, RDCS  Examination Guidelines: A complete  evaluation includes B-mode imaging, spectral Doppler, color Doppler, and power Doppler as needed of all accessible portions of each vessel. Bilateral testing is considered an integral part of a complete examination. Limited examinations for reoccurring indications may be performed as noted.  Right Findings: +----------+------------+---------+-----------+----------+--------------+ RIGHT     CompressiblePhasicitySpontaneousProperties   Summary     +----------+------------+---------+-----------+----------+--------------+ Subclavian                                          Not visualized +----------+------------+---------+-----------+----------+--------------+  Left Findings: +----------+------------+---------+-----------+----------+--------------+ LEFT      CompressiblePhasicitySpontaneousProperties   Summary     +----------+------------+---------+-----------+----------+--------------+ IJV           Full       Yes       Yes                             +----------+------------+---------+-----------+----------+--------------+ Subclavian    Full       Yes       Yes                             +----------+------------+---------+-----------+----------+--------------+ Axillary      Full       Yes       Yes                             +----------+------------+---------+-----------+----------+--------------+ Brachial      Full       Yes       Yes                             +----------+------------+---------+-----------+----------+--------------+ Radial        Full                                                 +----------+------------+---------+-----------+----------+--------------+ Ulnar                                               Not visualized +----------+------------+---------+-----------+----------+--------------+ Cephalic      Full                                                  +----------+------------+---------+-----------+----------+--------------+ Basilic                                             Not visualized +----------+------------+---------+-----------+----------+--------------+  Summary:  Left: No evidence of deep vein thrombosis in the upper extremity. No evidence of superficial vein thrombosis in the upper extremity. This study was limited due to technical limitations listed above.  *See table(s) above for measurements and observations.  Diagnosing physician: Servando Snare MD Electronically signed by Servando Snare MD on 11/07/2019 at 3:03:30 PM.  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 EXAM: ULTRASOUND AND FLUOROSCOPIC GUIDED PICC LINE INSERTION MEDICATIONS: 1% lidocaine to skin and subcutaneous tissue ANESTHESIA/SEDATION: None FLUOROSCOPY TIME:  Fluoroscopy Time:  42 seconds (7 mGy). COMPLICATIONS: None immediate. TECHNIQUE: The procedure, risks, benefits, and alternatives were explained to the patient and informed written consent was obtained. A timeout was performed prior to the initiation of the procedure. The right upper extremity was prepped with chlorhexidine in a sterile fashion, and a sterile drape was applied covering the operative field. Maximum barrier sterile technique with sterile gowns and gloves were used for the procedure. A timeout was performed prior to the initiation of the procedure. Local anesthesia was provided with 1% lidocaine. Under direct ultrasound guidance, the right brachial vein was accessed with a micropuncture kit after the overlying soft tissues were anesthetized with 1% lidocaine. An ultrasound image was saved for documentation purposes. A guidewire was advanced to the level of the superior caval-atrial junction for measurement purposes and the PICC  line was cut to length. A peel-away sheath was placed and a 39 cm, 5 [place], dual lumen was inserted to level of the superior caval-atrial junction. A post procedure spot fluoroscopic was obtained. The catheter easily aspirated and flushed and was sutured in place. A dressing was placed. The patient tolerated the procedure well without immediate post procedural complication. FINDINGS: After catheter placement, the tip lies within the superior cavoatrial junction the catheter aspirates and flushes normally and is ready for immediate use. IMPRESSION: Successful ultrasound and fluoroscopic guided placement of a right brachial vein approach, 39 cm, 5 French, dual lumen PICC with tip at the superior caval-atrial junction. The PICC line is ready for immediate use. Read by: Rowe Robert, PA-C Electronically Signed   By: Aletta Edouard M.D.   On: 11/10/2019 13:39   [place] EKG SITE RITE  Result Date: 11/07/2019 If Site Rite image not attached, placement could not be confirmed due to current cardiac rhythm.  US Abdomen Limited RUQ  Result Date: 11/14/2019 CLINICAL DATA:  74 year old female with transaminitis. History of breast cancer. EXAM: ULTRASOUND ABDOMEN LIMITED RIGHT UPPER QUADRANT COMPARISON:  CT of the abdomen pelvis dated 10/29/2019. FINDINGS: Gallbladder: Cholecystectomy. Common bile duct: Poorly visualized. Liver: The liver demonstrates a heterogeneous echotexture with innumerable hypoechoic lesions consistent with metastatic disease. 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 Portal vein is patent on color Doppler imaging with normal direction of blood flow towards the liver. Other: There is a small perihepatic ascites. Moderate right renal atrophy. IMPRESSION: 1. Extensive hepatic metastatic disease. 2. Small perihepatic ascites. 3. Patent main portal vein with hepatopetal flow. Electronically Signed   By: Anner Crete M.D.   On: 11/14/2019 16:37    ASSESSMENT AND PLAN: 1.     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  Primary peritoneal carcinoma favored given history of hysterectomy with negative pathology.  -10/29/2019 CEA 4.8, CA 19.9 441, AFP 2.1   -10/30/2019 CA 27.29 6728  -11/06/2019 CA-125 14,072  Cycle 1 Taxol/carboplatin 11/11/2019 2.  Elevated total bilirubin and LFTs 3.  AKI secondary to dehydration 4.  Hypercalcemia-status post Zometa and calcitonin, resolved 5.  Deconditioning 6.  History of breast cancer (high-grade DCIS) diagnosed in 2005 status  post right lumpectomy and radiation 7.  Diabetes mellitus 8.  Temporal arteritis, status post treatment 9.  Migraine headaches 10.  Leg weakness-steroid myopathy?,  Paraneoplastic syndrome? 11.  Pancytopenia secondary to chemotherapy 12.  E. coli and Proteus urinary tract infection 11/11/2019  Ms Scheidegger is now at day 8 following cycle 1 Taxol/carboplatin.  She has developed pancytopenia secondary to chemotherapy.  She has developed fevers and has been started on cefepime and vancomycin.  She is also noted to be hypotensive.  Still has significant anasarca.  She had a urinary tract infection with E. coli and Proteus last week.  There was erythema at the right lower abdominal wall yesterday.  This may be the source for infection.  Recommendations: 1.  Continue IV antibiotics and await cultures, continue antibiotics to cover the E. coli and Proteus found on urine culture 11/11/2019 2.  Granix has been increased to 480 mcg subcu daily. 3.  Agree with PRBC transfusion for hemoglobin of 6.8 today.  Continue to monitor hemoglobin closely and transfuse her hemoglobin less than 7. 4.  Monitor platelets closely.  Transfuse platelets if platelet count is less than 20,000 or active bleeding. 5.  Recommend PCCM consultation for management of  hypotension .   LOS: 19 days   Mikey Bussing 11/18/19  Ms. Garver was interviewed and examined.  Her family was at the bedside.  She has developed neutropenic sepsis.  She is now at day 8 following cycle 1 Taxol/carboplatin chemotherapy.  I discussed the situation with her family.  They understand she will likely die in hours without treatment of the sepsis.  They understand she has an incurable malignancy, but there is still a chance the cancer will improve with chemotherapy. Her performance status was much improved prior to declining yesterday.  She was alert, oriented, and beginning to participate in chemotherapy.  They patient and family agree to supportive care and treatment of the sepsis syndrome.  We will consult critical care medicine.  She is not a candidate for CPR or ventilator support.

## 2019-11-18 NOTE — Progress Notes (Signed)
CRITICAL VALUE ALERT  Critical Value:  HGB 6.8  Date & Time Notied:  11/18/19 @ [id number]   Provider Notified: M. Sharlet Salina NP via Amion   Orders Received/Actions taken: awaiting new orders

## 2019-11-18 NOTE — TOC Progression Note (Signed)
Transition of Care [facility]) - Progression Note    Patient Details  Name: [name] MRN: [id number] Date of Birth: February 28, 1946  Transition of Care [facility]) CM/SW Contact  Leeroy Cha, RN Phone Number: 11/18/2019, 9:39 AM  Clinical Narrative:    [facility] [place] [facility] Details [facility] Preferred SNF Details [facility] Preferred [facility] Details [facility] Details [facility] Details [facility] Preferred SN[facility] Details [facility] Above snf have accepted placement. Wbc0.8, hgb 6.8 one unit of prbc given.   Expected Discharge Plan: Skilled Nursing Facility Barriers to Discharge: Continued Medical Work up  Expected Discharge Plan and Services Expected Discharge Plan: [employer] arrangements for the past 2 months: Single Family Home                                       Social Determinants of Health (SDOH) Interventions    Readmission Risk Interventions Readmission Risk Prevention Plan 11/10/2019  Transportation Screening Complete  PCP or Specialist Appt within 3-5 Days Complete  HRI or [place] Complete  Social Work Consult for [place] Planning/Counseling Complete  Palliative Care Screening Complete  Medication Review [employer]) Complete  Some recent data might be hidden

## 2019-11-18 NOTE — Progress Notes (Signed)
PT Cancellation Note  Patient Details Name: SUMIYAH PIGG MRN: [id number] DOB: [birth year]   Cancelled Treatment:      Critical Lab values of HgB 6.8 along with K+ 3.2 are below parameters to perform Physical Therapy.  Will continue to monitor and check back another day  Rica Koyanagi  PTA Acute  Rehabilitation Services Pager      [phone] Office      [phone]

## 2019-11-18 NOTE — Consult Note (Signed)
NAME:  [name], MRN:  [id number], DOB:  05-10-1946, LOS: 41 ADMISSION DATE:  10/29/2019, CONSULTATION DATE:  11/18/2019 REFERRING MD:  Dr. Neysa Bonito, CHIEF COMPLAINT:  Hypotension    Brief History   74 year old female admitted with waxing and waning abdominal pain x3 months which has worsened over the last 3 weeks prior to admission.  Found to have hepatic metastasis, omental caking, and complex cystic left lower quadrant mass with retroperitoneal adenopathy.  PCCM consulted for management of hypotension.  History of present illness   [name] is a 74 year old female with history of type 2 diabetes, temporal arteritis, migraines, obstructive sleep apnea, and cervical radiculopathy who presented with waxing and waning complaints of abdominal pain x3 months which worsened 3 weeks prior to admission.  PCP encouraged presentation to ED for concern of underlying malignancy.  Found to have hepatic metastasis, omental caking, complex cystic left lower quadrant mass, and retroperitoneal adenopathy on admission.  Underwent biopsy by IR on 5/14 which was consistent with primary ovarian or primary peritoneal serous sarcoma.  Oncology and palliative care have been consulted.  Patient was started on chemotherapy during this hospitalization.  MRI of 6/1 patient was transferred to stepdown unit for elevated lactic acid of 7 coupled with hypotension.  Of note hemoglobin 6.8, 1 unit packed red blood cells currently infusing.  PCCM consulted for management of hypotension and concern of septic shock.  Past Medical History  Sleep apnea Obesity Hyperlipidemia GERD Type 2 diabetes Breast cancer Arthritis Anxiety  Significant Hospital Events   Admitted 5/12  Consults:  Oncology Palliative care PCCM  Procedures:  Ultrasound-guided core biopsy of liver lesion 5/14  Significant Diagnostic Tests:  Abdominal CT scan 5/12 > 1. Diffuse hepatic metastases. 2. Omental caking. Complex cystic mass left  lower quadrant consistent with necrotic lesion. 3. Pathologic retroperitoneal lymphadenopathy, most pronounced within the iliac distributions left greater than right. 4. Trace ascites.  CT chest without contrast 5/13 > 1. Nonspecific sub solid 5 mm right upper lobe pulmonary nodule. Neoplasm cannot be excluded in light of findings elsewhere within the chest and abdomen. 2. Extensive mediastinal lymphadenopathy worrisome for nodal metastases. 3. Aortic Atherosclerosis (ICD10-I70.0) and Emphysema (ICD10-J43.9). 4. Please refer to recent abdominal CT describing diffuse hepatic metastases.  MRI brain 5/14> Suboptimal evaluation due to motion artifact. No evidence of intracranial metastatic disease. No acute infarction or hemorrhage.  Abdominal ultrasound 5/28 > 1. Extensive hepatic metastatic disease. 2. Small perihepatic ascites. 3. Patent main portal vein with hepatopetal flow.   Micro Data:  COVID 5/12 > Negative Urine culture 5/25 > Positive Proteus and E.Coli > Resistant to Ampicillin, Cefazolin, Nitro Blood culture 5/31 > Negative  MRSA PCR 5/31 > Negative  Blood culture 6/1 >  Antimicrobials:  Cefepime 5/31 > Vancomycin 5/31  Interim history/subjective:  Lying in bed with flat affect, denies any acute complaints.   Objective   Blood pressure (!) 89/41, pulse (!) 104, temperature 98.4 F (36.9 C), temperature source Oral, resp. rate 17, height 5\' 4"  (1.626 m), weight 123.4 kg, SpO2 98 %.        Intake/Output Summary (Last 24 hours) at 11/18/2019 1108 Last data filed at 11/18/2019 0600 Gross per 24 hour  Intake 240 ml  Output 750 ml  Net -510 ml   Filed Weights   11/07/19 2041 11/08/19 2139 11/09/19 1609  Weight: 120.1 kg 120.1 kg 123.4 kg    Examination: General: Chronically ill appearing elderly very deconditioned female lying in bed in NAD HEENT: /AT,  MM pink/moist, PERRL, significant anasarca with weeping extremities   Neuro: Alert and oriented,  underlying confusion with flat affect,  CV: s1s2 regular rate and rhythm, no murmur, rubs, or gallops,  PULM:  Clear to ascultation bilaterally, no added breath sounds, oxygen saturations 89-98 on 2LNC GI: soft, bowel sounds active in all 4 quadrants, non-tender, non-distended Extremities: warm/dry, no edema  Skin: no rashes or lesions  Resolved Hospital Problem list     Assessment & Plan:  Ms. 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  During admission this far patient has received palliative chemotherapy with 1 cycle of Taxol/carboplatin 11/11/2019.  Since admission patient has progressively declined with worsening condition seen overnight and into morning of 6/1 requiring transfer to ICU.  Currently being treated for septic shock with known E. coli and Proteus UTI now with severe neutropenia post chemotherapy.  Critical care team consulted morning of 6/1 for further assistance and management including hypertension.  Long discussion held with patient's husband at the phone morning of 6/1.  He verbalizes understanding of maximum medical therapy in the context of advanced metastatic cancer.  He requests further discussion once he arrives at bedside.  He understands that patient is likely reached end-of-life and he understands that the next step would include comfort measures only.  Plan Status post fluid bolus, hold on any further maintenance IV hydration given severe anasarca Repeat albumin x2 Transfuse 1 unit packed red blood cells Continue to monitor blood pressure in the ICU setting Continue current medical management Goals of care discussion to be held with husband on arrival Hold on initiation of pressors at this time   Best practice:  Diet: Regular diet Pain/Anxiety/Delirium protocol (if indicated): As needed VAP protocol (if indicated): Not applicable DVT prophylaxis:  SCDs GI prophylaxis: PPI Glucose control: Monitor Mobility: Bedrest Code Status: DNR Family Communication: Updated extensively over the phone, awaiting in person conversation as well Disposition: ICU  Labs   CBC: Recent Labs  Lab 11/12/19 0336 11/12/19 0336 11/13/19 0500 11/14/19 0705 11/15/19 0557 11/17/19 1026 11/18/19 0517  WBC 17.5*   < > 14.8* 18.2* 17.4* 0.9* 0.8*  NEUTROABS 14.4*  --   --   --   --  0.5* 0.2*  HGB 9.7*   < > 10.4* 9.3* 8.4* 8.5* 6.8*  HCT 33.2*   < > 34.9* 30.4* 28.7* 28.0* 22.2*  MCV 104.1*   < > 102.6* 104.1* 104.0* 103.3* 105.7*  PLT 280   < > 264 195 149* 92* 63*   < > = values in this interval not displayed.    Basic Metabolic Panel: Recent Labs  Lab 11/12/19 0336 11/12/19 0336 11/13/19 0500 11/13/19 0500 11/14/19 0705 11/15/19 0557 11/16/19 0559 11/17/19 0634 11/18/19 0517  NA 137   < > 137   < > 142 139 144 142 143  K 4.5   < > 4.8   < > 4.4 3.9 3.9 3.4* 3.2*  CL 107   < > 103   < > 106 105 109 105 107  CO2 21*   < > 19*   < > 22 23 23 25  20*  GLUCOSE 117*   < > 125*   < > 117* 106* 129* 139* 132*  BUN 28*   < > 32*   < > 37* 39* 39* 37* 43*  CREATININE 0.56   < > 0.90   < > 0.67 0.68 0.70 0.67  0.86  CALCIUM 8.2*   < > 7.8*   < > 8.0* 7.9* 8.4* 8.3* 8.0*  MG 2.1  --  1.8  --   --  1.9  --   --  1.7  PHOS 2.1*  --   --   --   --   --   --   --   --    < > = values in this interval not displayed.   GFR: Estimated Creatinine Clearance: 76.7 mL/min (by C-G formula based on SCr of 0.86 mg/dL). Recent Labs  Lab 11/14/19 0705 11/15/19 0557 11/17/19 1026 11/17/19 1834 11/17/19 2255 11/18/19 0517  WBC 18.2* 17.4* 0.9*  --   --  0.8*  LATICACIDVEN  --   --   --  6.6* 7.0*  --     Liver Function Tests: Recent Labs  Lab 11/13/19 0500 11/14/19 0705 11/15/19 0557 11/16/19 0559 11/17/19 0634  AST 273* 202* 188* 197* 181*  ALT 87* 74* 64* 68* 62*  ALKPHOS 1,434* 1,325* 1,112* 1,185* 995*  BILITOT 2.6* 3.7* 4.9* 4.4* 4.4*    PROT 4.9* 4.7* 4.8* 4.9* 4.5*  ALBUMIN 1.8* 2.3* 2.5* 2.3* 2.3*   No results for input(s): LIPASE, AMYLASE in the last 168 hours. Recent Labs  Lab 11/12/19 1130  AMMONIA 47*    ABG    Component Value Date/Time   PHART 7.426 11/01/2019 1145   PCO2ART 36.0 11/01/2019 1145   PO2ART 71.4 (L) 11/01/2019 1145   HCO3 23.3 11/01/2019 1145   TCO2 33 (H) 06/17/2019 1723   ACIDBASEDEF 0.5 11/01/2019 1145   O2SAT 94.6 11/01/2019 1145     Coagulation Profile: Recent Labs  Lab 11/17/19 1600  INR 1.5*    Cardiac Enzymes: No results for input(s): CKTOTAL, CKMB, CKMBINDEX, TROPONINI in the last 168 hours.  HbA1C: Hgb A1c MFr Bld  Date/Time Value Ref Range Status  11/07/2019 03:40 PM 4.1 (L) 4.8 - 5.6 % Final    Comment:    (NOTE) Pre diabetes:          5.7%-6.4% Diabetes:              >6.4% Glycemic control for   <7.0% adults with diabetes   10/29/2019 11:29 PM 4.8 4.8 - 5.6 % Final    Comment:    (NOTE) Pre diabetes:          5.7%-6.4% Diabetes:              >6.4% Glycemic control for   <7.0% adults with diabetes     CBG: Recent Labs  Lab 11/17/19 2046  GLUCAP 150*     Signature:   Johnsie Cancel, NP-C [facility] Contact / Pager information can be found on Amion  11/18/2019, 12:04 PM

## 2019-11-18 NOTE — Progress Notes (Signed)
PROGRESS NOTE    [name]    Code Status: DNR  UN:[id number] DOB: 1945/06/21 DOA: 10/29/2019 LOS: 19 days  PCP: Leonides Sake, MD CC:  Chief Complaint  Patient presents with  . Abdominal Pain  . Abnormal Lab       Hospital Summary   This is a 74 year old female with history of type 2 diabetes, temporal arteritis, migraine, OSA and cervical radiculopathy who was admitted with waxing waning abdominal pain x3 months which had become worse 3 weeks prior to admission with generalized weakness and poor appetite as well as nausea who presented to her PCP and sent to the ED due to concerns of underlying malignancy.  In the ED she was found to have hepatic metastases, omental caking, complex cystic left lower quadrant mass and retroperitoneal adenopathy as well as hyponatremia, hypocalcemia and AKI.  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  Oncology and palliative care have been consulted.  She started on chemotherapy this hospitalization.  Has since been responding to diuresis for anasarca.  5/31: WBC 0.9 with low grade fever, Proteus and E coli UTI. Started on Vanc/Cefepime and Filgrastim. 6/1: Overnight, lactic acid 6.6->7.0, hypotension 61/29 with minimal improvement with albumin and fluids, Hb 6.8. 1u PRBC ordered. Transferred to SDU. PCCM consulted for septic shock  A & P   Principal Problem:   Metastatic neoplastic disease ([place]) Active Problems:   Uncontrolled type 2 diabetes mellitus with hyperglycemia, with long-term current use of insulin (HCC)   Hypercalcemia   AKI (acute kidney injury) ([place])   Dehydration with hyponatremia   GERD without esophagitis   Metastatic malignant neoplasm ([place])   Palliative care by specialist   Goals of care, counseling/discussion   DNR (do not resuscitate)   Hypercalcemia of malignancy   Hepatic encephalopathy ([place])   Palliative care encounter  Pressure injury of skin   Hepatic dysfunction   Drug-induced neutropenia (HCC)   Elevated lactic acid level   Chemotherapy-induced neutropenia (HCC)   1. 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 Had liver biopsy on 5/14 b. PICC line placement on 5/24 c. Started chemotherapy with carboplatin and Taxol on 5/25 without significant toxicity d. Appreciate oncology and palliative care recommendations. Oncology feels she has a poor prognosis but still too early to tell if she will have improvement from chemo e. [place] discussion today. Family considering hospice/comfort.  f. Palliative on board  2. Septic shock with E coli and Proteus UTI with severe neutropenia a. Previously was asymptomatic from UTI perspective without any urinary complaints, sudden change in clinical status on 5/31 b. WBC 18.2->17.9-> 0.9->0.8  c. Overnight 6/1: BP 61/29, tachycardic, hypoxic 89%, Hb 6.8, lactic acid 6.8->7.0. Received Albumin, IV fluid bolus and transfer to SDU d. Blood cultures unable to be obtained yesterday - only edematous fluid obtained. will order again today e. Oncology had a discussion with the patient and family today: continue with treatment and BP support f. 1L NS bolus g. PCCM consulted h. Filgrastim per oncology i. Appreciate further palliative input j. Poor overall prognosis   3. Acute encephalopathy, likely multifactorial: Metabolic, hepatic and now sepsis a. Had elevated ammonia level which improved initially with lactulose however she had multiple BMs and this was decreased b. Continue lactulose and titrate for 2-3 loose BMs daily  4. Pancytopenia a. Chemo/sepsis induced? b.  Transfuse 1 u PRBCs c. Oncology on board  5. Transaminitis secondary to metastases, improving a. RUQ ultrasound: extensive hepatic metastatic disease, patent main portal vein    6. Anasarca, slightly improved a. Hold lasix as above b. Unable to get accurate weights but -4.7 L c. Nutrition on board  7. AG metabolic acidosis likely from lactic acidosis a. As above  8. Nausea, likely exacerbated by bilirubinemia a. zofran PRN  9. Hypercalcemia, resolved a. Received Zometa x1  10. Oral Candidiasis a. Nystatin  11. Hypophosphatemia a. Nutrition consulted  12. Leg weakness a. Concern for steroid myopathy vs. Paraneoplastic syndrome per oncology b. Off steroids   DVT prophylaxis: Heparin Family Communication: Oncology updated patient's family today. I will discuss later Disposition Plan:  Status is: Inpatient  Remains inpatient appropriate because:Ongoing diagnostic testing needed not appropriate for outpatient work up and IV treatments appropriate due to intensity of illness or inability to take PO    Dispo: The patient is from: Home              Anticipated d/c is to: TBD              Anticipated d/c date is: > 3 days              Patient currently is not medically stable to d/c.    Pressure injury documentation   Pressure Injury 11/06/19 Sacrum Stage 2 -  Partial thickness loss of dermis presenting as a shallow open injury with a red, pink wound bed without slough. (Active)  11/06/19 1500  Location: Sacrum  Location Orientation:   Staging: Stage 2 -  Partial thickness loss of dermis presenting as a shallow open injury with a red, pink wound bed without slough.  Wound Description (Comments):   Present on Admission:    None  Consultants  Oncology Palliative care   Procedures  5/24 PICC line placement Started chemotherapy 5/25   Antibiotics   Anti-infectives (From admission, onward)   Start     Dose/Rate Route Frequency Ordered Stop   11/18/19 0400  vancomycin (VANCOCIN) IVPB 1000 mg/200 mL premix     1,000 mg 200 mL/hr over 60 Minutes Intravenous Every 12 hours 11/17/19 1441     11/17/19 2200  ceFEPIme (MAXIPIME) 2 g in sodium  chloride 0.9 % 100 mL IVPB     2 g 200 mL/hr over 30 Minutes Intravenous Every 8 hours 11/17/19 1435     11/17/19 1445  ceFEPIme (MAXIPIME) 2 g in sodium chloride 0.9 % 100 mL IVPB     2 g 200 mL/hr over 30 Minutes Intravenous STAT 11/17/19 1435 11/17/19 1614   11/17/19 1445  vancomycin (VANCOCIN) 2,500 mg in sodium chloride 0.9 % 500 mL IVPB     2,500 mg 250 mL/hr over 120 Minutes Intravenous STAT 11/17/19 1435 11/17/19 1821   11/01/19 1615  fluconazole (DIFLUCAN) tablet 150 mg     150 mg Oral  Once 11/01/19 1609 11/01/19 1713        Subjective   Overnight patient had further decline in clinical status with hypotension, lactic acidosis, anemia. Transferred to SDU with minimal improvement.   Patient complains of overall not feeling well but no specific complaints.   Objective   Vitals:   11/18/19 0330 11/18/19 0400 11/18/19 0600 11/18/19 0800  BP: (!) 77/32 (!) 82/36 (!) 74/39   Pulse: (!) 111 (!) 113 (!) 106   Resp: 18 (!) 21 14   Temp:    98.4 F (36.9  C)  TempSrc:    Oral  SpO2: (!) 89% 95% 95%   Weight:      Height:        Intake/Output Summary (Last 24 hours) at 11/18/2019 0822 Last data filed at 11/18/2019 0600 Gross per 24 hour  Intake 240 ml  Output 750 ml  Net -510 ml   Filed Weights   11/07/19 2041 11/08/19 2139 11/09/19 1609  Weight: 120.1 kg 120.1 kg 123.4 kg    Examination:  Physical Exam Vitals and nursing note reviewed.  Constitutional:      Appearance: She is obese. She is toxic-appearing.  HENT:     Head: Normocephalic.     Comments: Dry mucous membranes Cardiovascular:     Rate and Rhythm: Tachycardia present.     Heart sounds: Murmur present.     Comments: 2/6 systolic murmur Pulmonary:     Effort: No respiratory distress.  Abdominal:     Tenderness: There is abdominal tenderness.  Skin:    Coloration: Skin is pale. Skin is not cyanotic or jaundiced.  Neurological:     Mental Status: She is disoriented.  Psychiatric:        Mood  and Affect: Mood is not anxious.     Data Reviewed: I have personally reviewed following labs and imaging studies  CBC: Recent Labs  Lab 11/12/19 0336 11/12/19 0336 11/13/19 0500 11/14/19 0705 11/15/19 0557 11/17/19 1026 11/18/19 0517  WBC 17.5*   < > 14.8* 18.2* 17.4* 0.9* 0.8*  NEUTROABS 14.4*  --   --   --   --  0.5* 0.2*  HGB 9.7*   < > 10.4* 9.3* 8.4* 8.5* 6.8*  HCT 33.2*   < > 34.9* 30.4* 28.7* 28.0* 22.2*  MCV 104.1*   < > 102.6* 104.1* 104.0* 103.3* 105.7*  PLT 280   < > 264 195 149* 92* 63*   < > = values in this interval not displayed.   Basic Metabolic Panel: Recent Labs  Lab 11/12/19 0336 11/12/19 0336 11/13/19 0500 11/13/19 0500 11/14/19 0705 11/15/19 0557 11/16/19 0559 11/17/19 0634 11/18/19 0517  NA 137   < > 137   < > 142 139 144 142 143  K 4.5   < > 4.8   < > 4.4 3.9 3.9 3.4* 3.2*  CL 107   < > 103   < > 106 105 109 105 107  CO2 21*   < > 19*   < > 22 23 23 25  20*  GLUCOSE 117*   < > 125*   < > 117* 106* 129* 139* 132*  BUN 28*   < > 32*   < > 37* 39* 39* 37* 43*  CREATININE 0.56   < > 0.90   < > 0.67 0.68 0.70 0.67 0.86  CALCIUM 8.2*   < > 7.8*   < > 8.0* 7.9* 8.4* 8.3* 8.0*  MG 2.1  --  1.8  --   --  1.9  --   --  1.7  PHOS 2.1*  --   --   --   --   --   --   --   --    < > = values in this interval not displayed.   GFR: Estimated Creatinine Clearance: 76.7 mL/min (by C-G formula based on SCr of 0.86 mg/dL). Liver Function Tests: Recent Labs  Lab 11/13/19 0500 11/14/19 0705 11/15/19 0557 11/16/19 0559 11/17/19 0634  AST 273* 202* 188* 197* 181*  ALT 87* 74*  64* 68* 62*  ALKPHOS 1,434* 1,325* 1,112* 1,185* 995*  BILITOT 2.6* 3.7* 4.9* 4.4* 4.4*  PROT 4.9* 4.7* 4.8* 4.9* 4.5*  ALBUMIN 1.8* 2.3* 2.5* 2.3* 2.3*   No results for input(s): LIPASE, AMYLASE in the last 168 hours. Recent Labs  Lab 11/12/19 1130  AMMONIA 47*   Coagulation Profile: Recent Labs  Lab 11/17/19 1600  INR 1.5*   Cardiac Enzymes: No results for input(s):  CKTOTAL, CKMB, CKMBINDEX, TROPONINI in the last 168 hours. BNP (last 3 results) No results for input(s): PROBNP in the last 8760 hours. HbA1C: No results for input(s): HGBA1C in the last 72 hours. CBG: Recent Labs  Lab 11/17/19 2046  GLUCAP 150*   Lipid Profile: No results for input(s): CHOL, HDL, LDLCALC, TRIG, CHOLHDL, LDLDIRECT in the last 72 hours. Thyroid Function Tests: No results for input(s): TSH, T4TOTAL, FREET4, T3FREE, THYROIDAB in the last 72 hours. Anemia Panel: No results for input(s): VITAMINB12, FOLATE, FERRITIN, TIBC, IRON, RETICCTPCT in the last 72 hours. Sepsis Labs: Recent Labs  Lab 11/17/19 1834 11/17/19 2255  LATICACIDVEN 6.6* 7.0*    Recent Results (from the past 240 hour(s))  Culture, Urine     Status: Abnormal   Collection Time: 11/11/19  6:45 PM   Specimen: Urine, Random  Result Value Ref Range Status   Specimen Description   Final    URINE, RANDOM Performed at [facility] [address] [place], [place] [address]    Special Requests   Final    NONE Performed at [facility], [place] [address] [place], [place] [address]    Culture (A)  Final    >=100,000 COLONIES/mL PROTEUS VULGARIS >=100,000 COLONIES/mL ESCHERICHIA COLI    Report Status 11/14/2019 FINAL  Final   Organism ID, Bacteria PROTEUS VULGARIS (A)  Final   Organism ID, Bacteria ESCHERICHIA COLI (A)  Final      Susceptibility   Escherichia coli - MIC*    AMPICILLIN 4 SENSITIVE Sensitive     CEFAZOLIN <=4 SENSITIVE Sensitive     CEFTRIAXONE <=1 SENSITIVE Sensitive     CIPROFLOXACIN <=0.25 SENSITIVE Sensitive     GENTAMICIN <=1 SENSITIVE Sensitive     IMIPENEM <=0.25 SENSITIVE Sensitive     NITROFURANTOIN <=16 SENSITIVE Sensitive     TRIMETH/SULFA <=20 SENSITIVE Sensitive     AMPICILLIN/SULBACTAM <=2 SENSITIVE Sensitive     PIP/TAZO <=4 SENSITIVE Sensitive     * >=100,000 COLONIES/mL ESCHERICHIA COLI   Proteus vulgaris - MIC*    AMPICILLIN  >=32 RESISTANT Resistant     CEFAZOLIN >=64 RESISTANT Resistant     CEFTRIAXONE <=1 SENSITIVE Sensitive     CIPROFLOXACIN <=0.25 SENSITIVE Sensitive     GENTAMICIN <=1 SENSITIVE Sensitive     IMIPENEM 2 SENSITIVE Sensitive     NITROFURANTOIN RESISTANT Resistant     TRIMETH/SULFA <=20 SENSITIVE Sensitive     AMPICILLIN/SULBACTAM 8 SENSITIVE Sensitive     PIP/TAZO <=4 SENSITIVE Sensitive     * >=100,000 COLONIES/mL PROTEUS VULGARIS  MRSA PCR Screening     Status: None   Collection Time: 11/17/19  8:58 PM   Specimen: Nasal Mucosa; Nasopharyngeal  Result Value Ref Range Status   MRSA by PCR NEGATIVE NEGATIVE Final    Comment:        The GeneXpert MRSA Assay (FDA approved for NASAL specimens only), is one component of a comprehensive MRSA colonization surveillance program. It is not intended to diagnose MRSA infection nor to guide or monitor treatment for MRSA  infections. Performed at [facility], [place] [address], [place], [place] [address]          Radiology Studies: No results found.      Scheduled Meds: . sodium chloride   Intravenous Once  . chlorhexidine  15 mL Mouth Rinse BID  . Chlorhexidine Gluconate Cloth  6 each Topical Daily  . citalopram  10 mg Oral Daily  . feeding supplement (ENSURE ENLIVE)  237 mL Oral TID BM  . fluticasone  1 spray Each Nare BID  . lactulose  30 g Oral Daily  . mouth rinse  15 mL Mouth Rinse q12n4p  . mouth rinse  15 mL Mouth Rinse BID  . nystatin  5 mL Mouth/Throat QID  . nystatin   Topical TID  . pantoprazole  40 mg Oral Q supper  . Tbo-filgrastim (GRANIX) injection [facility]  480 mcg Subcutaneous q1800   Continuous Infusions: . sodium chloride    . ceFEPime (MAXIPIME) IV Stopped (11/18/19 0733)  . sodium chloride    . vancomycin 1,000 mg (11/18/19 0519)     Time spent: 40 minutes with over 50% of the time coordinating the patient's care    Harold Hedge, DO [facility] Pager [phone]  Call  night coverage person covering after 7pm

## 2019-11-19 ENCOUNTER — Telehealth: Payer: Self-pay | Admitting: *Deleted

## 2019-11-19 DIAGNOSIS — E1169 Type 2 diabetes mellitus with other specified complication: Secondary | ICD-10-CM

## 2019-11-19 LAB — COMPREHENSIVE METABOLIC PANEL
ALT: 51 U/L — ABNORMAL HIGH (ref 0–44)
AST: 168 U/L — ABNORMAL HIGH (ref 15–41)
Albumin: 2 g/dL — ABNORMAL LOW (ref 3.5–5.0)
Alkaline Phosphatase: 684 U/L — ABNORMAL HIGH (ref 38–126)
Anion gap: 14 (ref 5–15)
BUN: 43 mg/dL — ABNORMAL HIGH (ref 8–23)
CO2: 22 mmol/L (ref 22–32)
Calcium: 7.9 mg/dL — ABNORMAL LOW (ref 8.9–10.3)
Chloride: 108 mmol/L (ref 98–111)
Creatinine, Ser: 0.64 mg/dL (ref 0.44–1.00)
GFR calc Af Amer: >60 mL/min (ref >60)
GFR calc non Af Amer: >60 mL/min (ref >60)
Glucose, Bld: 141 mg/dL — ABNORMAL HIGH (ref 70–99)
Potassium: 2.6 mmol/L — CL (ref 3.5–5.1)
Sodium: 144 mmol/L (ref 135–145)
Total Bilirubin: 6 mg/dL — ABNORMAL HIGH (ref 0.3–1.2)
Total Protein: 4.5 g/dL — ABNORMAL LOW (ref 6.5–8.1)

## 2019-11-19 LAB — TYPE AND SCREEN
ABO/RH(D): O NEG
Antibody Screen: NEGATIVE
Unit division: 0

## 2019-11-19 LAB — DIFFERENTIAL
Abs Immature Granulocytes: 0.09 10*3/uL — ABNORMAL HIGH (ref 0.00–0.07)
Basophils Absolute: 0 10*3/uL (ref 0.0–0.1)
Basophils Relative: 1 %
Eosinophils Absolute: 0 10*3/uL (ref 0.0–0.5)
Eosinophils Relative: 2 %
Immature Granulocytes: 8 %
Lymphocytes Relative: 35 %
Lymphs Abs: 0.4 10*3/uL — ABNORMAL LOW (ref 0.7–4.0)
Monocytes Absolute: 0.2 10*3/uL (ref 0.1–1.0)
Monocytes Relative: 21 %
Neutro Abs: 0.4 10*3/uL — ABNORMAL LOW (ref 1.7–7.7)
Neutrophils Relative %: 33 %

## 2019-11-19 LAB — CBC
HCT: 25.5 % — ABNORMAL LOW (ref 36.0–46.0)
Hemoglobin: 7.9 g/dL — ABNORMAL LOW (ref 12.0–15.0)
MCH: 31.7 pg (ref 26.0–34.0)
MCHC: 31 g/dL (ref 30.0–36.0)
MCV: 102.4 fL — ABNORMAL HIGH (ref 80.0–100.0)
Platelets: 59 10*3/uL — ABNORMAL LOW (ref 150–400)
RBC: 2.49 MIL/uL — ABNORMAL LOW (ref 3.87–5.11)
RDW: 20.6 % — ABNORMAL HIGH (ref 11.5–15.5)
WBC: 1.1 10*3/uL — CL (ref 4.0–10.5)
nRBC: 23.6 % — ABNORMAL HIGH (ref 0.0–0.2)

## 2019-11-19 LAB — BPAM RBC
Blood Product Expiration Date: 202106062359
ISSUE DATE / TIME: 202106011144
Unit Type and Rh: 9500

## 2019-11-19 MED ORDER — SODIUM CHLORIDE 0.9% FLUSH: 10.0000 mL | INTRAVENOUS | Status: DC | PRN

## 2019-11-19 MED ORDER — HEPARIN SOD (PORK) LOCK FLUSH 100 UNIT/ML IV SOLN
250.0000 [IU] | INTRAVENOUS | Status: AC | PRN
  Administered 2019-11-19: 250 [IU]
  Filled 2019-11-19: qty 2.5

## 2019-11-19 MED ORDER — AMOXICILLIN-POT CLAVULANATE 875-125 MG PO TABS
1.0000 | ORAL_TABLET | Freq: Two times a day (BID) | ORAL | Status: DC
  Administered 2019-11-19: 1 via ORAL
  Filled 2019-11-19: qty 1

## 2019-11-19 MED ORDER — POTASSIUM CHLORIDE CRYS ER 20 MEQ PO TBCR
40.0000 meq | EXTENDED_RELEASE_TABLET | ORAL | Status: AC
  Administered 2019-11-19 (×2): 40 meq via ORAL
  Filled 2019-11-19 (×2): qty 2

## 2019-11-19 MED ORDER — ENSURE ENLIVE PO LIQD: 237.0000 mL | Freq: Three times a day (TID) | ORAL | 0 refills | Status: AC

## 2019-11-19 MED ORDER — ACETAMINOPHEN 325 MG PO TABS: 650.0000 mg | ORAL_TABLET | Freq: Four times a day (QID) | ORAL | 0 refills | Status: AC | PRN

## 2019-11-19 MED ORDER — HALOPERIDOL LACTATE 5 MG/ML IJ SOLN: 2.0000 mg | Freq: Four times a day (QID) | INTRAMUSCULAR | Status: DC | PRN

## 2019-11-19 MED ORDER — ALBUMIN HUMAN 25 % IV SOLN
12.5000 g | INTRAVENOUS | Status: DC
  Filled 2019-11-19: qty 50

## 2019-11-19 MED ORDER — HYDROMORPHONE HCL 1 MG/ML IJ SOLN
0.5000 mg | INTRAMUSCULAR | Status: DC | PRN
  Administered 2019-11-19 (×2): 0.5 mg via INTRAVENOUS
  Filled 2019-11-19 (×2): qty 1

## 2019-11-19 MED ORDER — CITALOPRAM HYDROBROMIDE 10 MG PO TABS: 10.0000 mg | ORAL_TABLET | Freq: Every day | ORAL | 0 refills | Status: AC

## 2019-11-19 MED ORDER — AMOXICILLIN-POT CLAVULANATE 875-125 MG PO TABS: 1.0000 | ORAL_TABLET | Freq: Two times a day (BID) | ORAL | 0 refills | Status: AC

## 2019-11-19 MED ORDER — OCTREOTIDE ACETATE 100 MCG/ML IJ SOLN
100.0000 ug | Freq: Once | INTRAMUSCULAR | Status: DC
  Filled 2019-11-19: qty 1

## 2019-11-19 MED ORDER — GERHARDT'S BUTT CREAM
TOPICAL_CREAM | Freq: Four times a day (QID) | CUTANEOUS | Status: DC
  Filled 2019-11-19: qty 1

## 2019-11-19 NOTE — Progress Notes (Signed)
PT Cancellation Note  Patient Details Name: [name] MRN: [id number] DOB: 1946/05/25   Cancelled Treatment:    Reason Eval/Treat Not Completed: Other (comment)(sign off) transitioning to comfort care   [facility] 11/19/2019, 12:28 PM

## 2019-11-19 NOTE — Progress Notes (Signed)
[place] Progress Note Patient Name: [name] DOB: 1946-05-02 MRN: [id number]   Date of Service  11/19/2019  HPI/Events of Note  K+ = 2.6 and Creatinine = 0.64.  eICU Interventions  Will replace K+.      Intervention Category Major Interventions: Electrolyte abnormality - evaluation and management  Tyrick Dunagan [place] 11/19/2019, 5:33 AM

## 2019-11-19 NOTE — Progress Notes (Signed)
Nutrition Follow-up  DOCUMENTATION CODES:   Morbid obesity  INTERVENTION:  - continue Ensure Enlive TID and Magic Cup BID.  NUTRITION DIAGNOSIS:   Inadequate oral intake related to poor appetite as evidenced by per patient/family report. -ongiong  GOAL:   Patient will meet greater than or equal to 90% of their needs -unmet on average  MONITOR:   PO intake, Supplement acceptance, Weight trends, Labs, I & O's  ASSESSMENT:   Pt presented with a 3 month h/o abdominal pain and a 3 week h/o generalized weakness, anorexia, N/V. 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  Patient has been accepting Ensure ~75% of the time offered. Per flow sheet documentation, she ate 50% of breakfast and 0% of dinner on 5/27; 25% of lunch on 5/31.  Flow sheet documentation indicates deep pitting edema to all extremities.   Patient noted to be a/o to self and place. Her husband is at bedside and expresses frustration in lack of assistance during meals (patient is unable to feed herself) and in being unclear on what the plan for care is moving forward. He requested that Palliative Care be contacted so that they can visit patient's room so he can talk with them; reached out to Palliative Care and they plan to stop by today.   From notes, it sounds like the plan is likely for d/c home with hospice vs to [facility].     Labs reviewed; K: 2.6 mmol/l, BUN: 43 mg/dl, Ca: 7.9 mg/dl, Alk Phos elevated, LFTs elevated.  Medications reviewed; 30 g lactulose/day, 5 ml mycostatin QID.   Diet Order:   Diet Order            Diet regular Room service appropriate? Yes; Fluid consistency: Thin  Diet effective now              EDUCATION NEEDS:   Not appropriate for education at this time  Skin:  Skin Assessment: Skin Integrity Issues: Skin Integrity Issues:: Stage II,  Incisions Stage II: sacrum Incisions: neck, abdomen  Last BM:  6/2  Height:   Ht Readings from Last 1 Encounters:  11/09/19 5' 4"  (1.626 m)    Weight:   Wt Readings from Last 1 Encounters:  11/19/19 123.3 kg    Ideal Body Weight:     BMI:  Body mass index is 46.66 kg/m.  Estimated Nutritional Needs:   Kcal:  1900-2100  Protein:  95-105 grams  Fluid:  >1.9L/d     Jarome Matin, MS, RD, LDN, CNSC Inpatient Clinical Dietitian RD pager # available in [facility]  After hours/weekend pager # available in [facility]

## 2019-11-19 NOTE — Discharge Summary (Signed)
Physician Discharge Summary  [name]:[id number] DOB: 1945/12/07 DOA: 10/29/2019  PCP: Leonides Sake, MD  Admit date: 10/29/2019 Discharge date: 11/19/2019  Admitted From: Home  Disposition:  Hospice house.   Recommendations for Outpatient Follow-up and new medication changes:  1. Follow up Hospice service. 2. Follow up with Dr. Lisbeth Ply in 7 days. 3. Continue oral antibiotic therapy with Augmentin.  4. Hold furosemide and statin therapy for now.  5. Citalopram changed from 40 mg to 10 mg daily.   Home Health: na   Equipment/Devices: na    Discharge Condition: stable  CODE STATUS: DNR   Diet recommendation: regular as tolerated.   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 coli and Proteus urinary tract infection (not present on admission).   74 year old female who presented with abdominal pain, she has the significant past medical history for lumbar spinal stenosis, type 2 diabetes mellitus, temporal arteritis, cervical radiculopathy and GERD.  At home patient had progressive abdominal pain for the last 3 months but worse over last 3 weeks, associated with poor oral intake, nausea, vomiting and generalized weakness.  Her primary care provider referred her to the hospital for further evaluation of abnormal blood work and concerned about malignancy.  In the emergency department further work-up with CT of the abdomen revealed hepatic metastasis, omental caking, complex cystic mass left lower quadrant consistent with necrotic lesion, pathologic retroperitoneal lymphadenopathy most pronounced within the iliac distributions left greater than right, trace ascites.  She had acute kidney injury, hyponatremia and hypocalcemia.  Prolonged hospitalization, she underwent IR liver biopsy 5/14 which resulted in primary ovarian or primary peritoneal serous carcinoma.   Oncology and palliative care were consulted.  Patient started chemotherapy on 05/24 (carboplatin and Taxol).  She received diuresis for anasarca.  5/31st, she developed severe neutropenia, and fever.  Her urine culture was positive for Proteus and E. Coli.  The following day 06/01 she developed lactic acidosis and hypotension.  Her hemoglobin dropped to 6.8, received 1 unit packed red blood cells and she was transferred to stepdown unit for further management.   1. Metastatic ovarian cancer/ peritoneal carcinoma, complicated with neutropenia/ pancytopenia/ elevated liver enzymes and anasarca.  Patient with advanced malignancy, she had 1 trial of chemotherapy with further significant deconditioning.  She does have a poor prognosis.  She has decided not to pursue further cancer treatment.    Patient received filgrastim for severe neutropenia per oncology recommendations.  Her acute on chronic anemia seems to be multifactorial, patient received 1 unit of packed red blood cells transfusion.  Palliative care has been consulted, goals of care were discussed and patient will be transferred to hospice for further care.  2. Urine infection E. Coli and Proteus, complicated with septic shock, not present on admission.  Patient was transferred to stepdown unit, she regained hemodynamic stability with IV fluids and IV antibiotics.  Her goals of care were addressed, IV antibiotics were discontinued and patient has been transferred to hospice.  Her PICC line will be removed before discharge. Patient will continue taking Augmentin for 5 more days.   3. Metabolic encephalopathy/ hepatic encephalopathy.  Patient received supportive medical therapy for her encephalopathy, at discharge she is awake and alert.  No agitation.  Patient did receive lactulose during her hospital stay.  4. AKI with anion gap metabolic acidosis/ hypercalcemia/ hypokalemia hypophosphatemia.  Patient with anasarca, and one-point  during her hospitalization  she was diuresed.  For hypercalcemia she received zoledronic acid and nutrition was consulted for her hypophosphatemia.  5. Oral candidiasis.  Patient received nystatin with good toleration. At discharge will continue with oral fluconazole.   6. Acute on chronic hepatic failure.  Multiple liver metastatic lesions.  Patient received lactulose during her hospitalization.  INR is 1.5.  7. Stage 2 pressure ulcer/ sacrum. Present on admission, continue with local wound care.   8.  T2Dm (Hgb A1c 4,1). Patient with very poor oral intake, will hold on insulin therapy for now.   9. Obesity. Calculated BMI is 46,6. Continue comfort care at hospice.    Discharge Diagnoses:  Principal Problem:   Metastatic neoplastic disease ([place]) Active Problems:   Hypercalcemia   AKI (acute kidney injury) ([place])   Dehydration with hyponatremia   GERD without esophagitis   Metastatic malignant neoplasm ([place])   Palliative care by specialist   Goals of care, counseling/discussion   DNR (do not resuscitate)   Hypercalcemia of malignancy   Hepatic encephalopathy ([place])   Palliative care encounter   Pressure injury of skin   Hepatic dysfunction   Drug-induced neutropenia (HCC)   Elevated lactic acid level   Chemotherapy-induced neutropenia (H[facility])   Septic shock (H[facility])   Type 2 diabetes mellitus with other specified complication [facility])    Discharge Instructions  Discharge Instructions    TREATMENT CONDITIONS   Complete by: As directed    Patient should have CBC & CMP within 7 days prior to chemotherapy administration. NOTIFY MD IF: ANC < 1500, Hemoglobin < 8, PLT < 100,000,  Total Bili > 1.5, Creatinine > 1.5, ALT & AST > 80 or if patient has unstable vital signs: Temperature > 38.5, SBP > 180 or < 90, RR > 30 or HR > 100.     Allergies as of 11/19/2019      Reactions   Nickel Hives, Rash      Medication List    STOP taking these medications   furosemide 80 MG  tablet Commonly known as: LASIX   simvastatin 20 MG tablet Commonly known as: ZOCOR     TAKE these medications   acetaminophen 325 MG tablet Commonly known as: TYLENOL Take 2 tablets (650 mg total) by mouth every 6 (six) hours as needed for mild pain (or Fever >/= 101).   amoxicillin-clavulanate 875-125 MG tablet Commonly known as: AUGMENTIN Take 1 tablet by mouth every 12 (twelve) hours for 5 days.   citalopram 10 MG tablet Commonly known as: CELEXA Take 1 tablet (10 mg total) by mouth daily. Start taking on: November 20, 2019 What changed:   medication strength  how much to take   feeding supplement (ENSURE ENLIVE) Liqd Take 237 mLs by mouth 3 (three) times daily between meals.   fluconazole 150 MG tablet Commonly known as: DIFLUCAN Take 150 mg by mouth daily.   fluticasone 50 MCG/ACT nasal spray Commonly known as: FLONASE Place 1 spray into the nose 2 (two) times daily.   pantoprazole 40 MG tablet Commonly known as: PROTONIX Take 40 mg by mouth daily.       Allergies  Allergen Reactions  . Nickel Hives and Rash    Consultations:  PCCM  Palliative [place]  IR  Oncology    Procedures/Studies: CT CHEST WO CONTRAST  Result Date: 10/30/2019 CLINICAL DATA:  Metastatic malignancy EXAM: CT CHEST WITHOUT CONTRAST TECHNIQUE: Multidetector CT imaging of the chest was performed following the standard protocol without IV contrast. COMPARISON:  10/29/2019, 06/18/2019  FINDINGS: Cardiovascular: No pericardial effusion. Normal caliber of the thoracic aorta. Minimal atherosclerosis. Mediastinum/Nodes: Numerous enlarged mediastinal lymph nodes are identified. Index lymph node in the right paratracheal region measures 21 x 21 mm, previous having measured 27 x 22 mm. Increased number of enlarged lymph nodes throughout the mediastinum compared to prior neck CT. The trachea, esophagus, and thyroid are unremarkable. Lungs/Pleura: There is a small spiculated sub solid nodule in the  right upper lobe image 35, measuring 5 mm. No other pulmonary nodules or masses. Mild upper lobe predominant emphysema. Hypoventilatory changes at the lung bases. No large effusion or pneumothorax. Upper Abdomen: Please refer to recent abdominal CT describing numerous hepatic metastases as well as omental caking and trace ascites. Musculoskeletal: There are no acute or destructive bony lesions. Reconstructed images demonstrate no additional findings. IMPRESSION: 1. Nonspecific sub solid 5 mm right upper lobe pulmonary nodule. Neoplasm cannot be excluded in light of findings elsewhere within the chest and abdomen. 2. Extensive mediastinal lymphadenopathy worrisome for nodal metastases. 3. Aortic Atherosclerosis (ICD10-I70.0) and Emphysema (ICD10-J43.9). 4. Please refer to recent abdominal CT describing diffuse hepatic metastases. Electronically Signed   By: Randa Ngo M.D.   On: 10/30/2019 21:56   MR BRAIN W WO CONTRAST  Result Date: 10/31/2019 CLINICAL DATA:  Weakness, metastases EXAM: MRI HEAD WITHOUT AND WITH CONTRAST TECHNIQUE: Multiplanar, multiecho pulse sequences of the brain and surrounding structures were obtained without and with intravenous contrast. CONTRAST:  34m GADAVIST GADOBUTROL 1 MMOL/ML IV SOLN COMPARISON:  09/30/2018 FINDINGS: Motion artifact is present Brain: There is no acute infarction or intracranial hemorrhage. There is no intracranial mass, mass effect, or edema. There is no hydrocephalus or extra-axial fluid collection. Ventricles and sulci are stable in size and configuration. Patchy T2 hyperintensity in the supratentorial and pontine white matter is nonspecific but probably reflects mild chronic microvascular ischemic changes. No abnormal enhancement. Vascular: Major vessel flow voids at the skull base are preserved. Skull and upper cervical spine: Normal marrow signal is preserved. There is susceptibility artifact related to partially imaged cervical spine fusion.  Sinuses/Orbits: Paranasal sinuses are aerated. Orbits are unremarkable. Other: Sella is unremarkable.  Mastoid air cells are clear. IMPRESSION: Suboptimal evaluation due to motion artifact. No evidence of intracranial metastatic disease. No acute infarction or hemorrhage. Electronically Signed   By: PMacy MisM.D.   On: 10/31/2019 14:24   CT ABDOMEN PELVIS W CONTRAST  Result Date: 10/29/2019 CLINICAL DATA:  Hyperkalemia,, liver disease, emesis, weight loss EXAM: CT ABDOMEN AND PELVIS WITH CONTRAST TECHNIQUE: Multidetector CT imaging of the abdomen and pelvis was performed using the standard protocol following bolus administration of intravenous contrast. CONTRAST:  740mOMNIPAQUE IOHEXOL 300 MG/ML  SOLN COMPARISON:  06/17/2019, 01/05/2006 FINDINGS: Lower chest: No acute pleural or parenchymal lung disease. Hepatobiliary: There are innumerable masses replacing the majority of the liver parenchyma consistent with diffuse metastatic disease. The gallbladder is surgically absent. Pancreas: Unremarkable. No pancreatic ductal dilatation or surrounding inflammatory changes. Spleen: Normal in size without focal abnormality. Adrenals/Urinary Tract: Adrenal glands are unremarkable. Kidneys are normal, without renal calculi, focal lesion, or hydronephrosis. Bladder is unremarkable. Stomach/Bowel: No bowel obstruction or ileus. No bowel wall thickening or inflammatory changes. Vascular/Lymphatic: There is pathologic adenopathy within the retroperitoneum. Largest pathologic lymph node in the left external iliac chain reference image 77 measures 3.5 x 3.1 cm. A second index lymph node at the right iliac bifurcation measures 2.6 x 2.2 cm reference image 62. There is minimal atherosclerosis of the aorta. Reproductive: Uterus is  surgically absent. I do not see any adnexal lesions. Other: There is soft tissue fat stranding throughout the ventral omentum consistent with omental caking. Complex cystic mass within the left  lower quadrant along the anterior abdominal wall measuring 5.7 x 7.2 cm is most consistent with a necrotic mass. Mural thickening along the anterior and lateral margin of the mass. There is trace free fluid within the upper abdomen surrounding the liver and spleen. No free intra-abdominal gas. Musculoskeletal: There are no acute or destructive bony lesions. Reconstructed images demonstrate no additional findings. IMPRESSION: 1. Diffuse hepatic metastases. 2. Omental caking. Complex cystic mass left lower quadrant consistent with necrotic lesion. 3. Pathologic retroperitoneal lymphadenopathy, most pronounced within the iliac distributions left greater than right. 4. Trace ascites. Electronically Signed   By: Randa Ngo M.D.   On: 10/29/2019 22:15   US BIOPSY (LIVER)  Result Date: 10/31/2019 INDICATION: 74 year old female with multifocal hepatic lesions concerning for metastatic disease. She presents for ultrasound-guided biopsy of the same. EXAM: ULTRASOUND BIOPSY CORE LIVER MEDICATIONS: None. ANESTHESIA/SEDATION: Moderate (conscious) sedation was employed during this procedure. A total of Versed 1 mg and Fentanyl 50 mcg was administered intravenously. Moderate Sedation Time: 13 minutes. The patient's level of consciousness and vital signs were monitored continuously by radiology nursing throughout the procedure under my direct supervision. FLUOROSCOPY TIME:  None. COMPLICATIONS: None immediate. PROCEDURE: Informed written consent was obtained from the patient after a thorough discussion of the procedural risks, benefits and alternatives. All questions were addressed. A timeout was performed prior to the initiation of the procedure. The liver was interrogated with ultrasound. There are innumerable hypoechoic solid lesions scattered throughout the liver. A suitable lesion in the left hepatic lobe was identified. The overlying skin was sterilely prepped and draped in the standard fashion. Local anesthesia was  attained by infiltration with 1% lidocaine. A small dermatotomy was made. Under real-time ultrasound guidance, multiple 18 gauge core biopsies were obtained using the bio Pince automated biopsy device. Biopsies were performed coaxially through a 17 gauge introducer needle. As the introducer needle was withdrawn, the biopsy tract was embolized with a Gel-Foam slurry. Post ultrasound imaging demonstrates no evidence of immediate complication. IMPRESSION: Technically successful ultrasound-guided core biopsy of liver lesion. Electronically Signed   By: Jacqulynn Cadet M.D.   On: 10/31/2019 12:09   DG CHEST PORT 1 VIEW  Result Date: 11/05/2019 CLINICAL DATA:  Acute respiratory distress EXAM: PORTABLE CHEST 1 VIEW COMPARISON:  06/17/2019 FINDINGS: Single frontal view of the chest demonstrates an enlarged cardiac silhouette. Lung volumes are diminished. Mild increased interstitial prominence with central vascular congestion consistent with mild fluid overload. Small right pleural effusion. No pneumothorax. IMPRESSION: 1. Findings suggesting mild interstitial edema. Electronically Signed   By: Randa Ngo M.D.   On: 11/05/2019 22:51   DG Swallowing Func-Speech Pathology  Result Date: 11/06/2019 Objective Swallowing Evaluation: Type of Study: MBS-Modified Barium Swallow Study  Patient Details Name: AIMIE WAGMAN MRN: [id number] Date of Birth: Jun 07, 1946 Today's Date: 11/06/2019 Time: SLP Start Time (ACUTE ONLY): 73 -SLP Stop Time (ACUTE ONLY): 1300 SLP Time Calculation (min) (ACUTE ONLY): 30 min Past Medical History: Past Medical History: Diagnosis Date . ADD (attention deficit disorder)  . Anxiety  . Arthritis   os . Breast cancer ([place])  . Diabetes mellitus without complication ([place])  . GERD (gastroesophageal reflux disease)   barretts . Headache  . High cholesterol  . Neck pain  . Obesity  . Personal history of radiation therapy 2005 . Sleep apnea  cpap   last sleep study> 3 yrs . Swelling of both ankles   . Weakness 06/2019 Past Surgical History: Past Surgical History: Procedure Laterality Date . ABDOMINAL HYSTERECTOMY   . ANTERIOR CERVICAL DECOMP/DISCECTOMY FUSION N/A 02/15/2016  Procedure: Cervical three four-Cervical five-six, Cervical six-seven Anterior cervical decompression/diskectomy/fusion;  Surgeon: Kristeen Miss, MD;  Location: [facility];  Service: Neurosurgery;  Laterality: N/A; . bicept surgery   . BREAST LUMPECTOMY   . CHOLECYSTECTOMY   . JOINT REPLACEMENT   . PAROTID GLAND TUMOR EXCISION   . TONSILLECTOMY   . TOTAL KNEE ARTHROPLASTY  07/19/2012  Procedure: TOTAL KNEE ARTHROPLASTY;  Surgeon: Yvette Rack., MD;  Location: [facility];  Service: Orthopedics;  Laterality: Right;  right total knee arthroplasty . TUBAL LIGATION   HPI: Pt is a 74 year old female admitted with medical decline and abnormal labs, found to have signs of malignant breast cancer with hepatic metastases. Admitted with subacute abdominal pain waxing waning over 3 months more acutely 3 weeks ago developed worsening weakness poor appetite nausea.  Has had difficulty swallowing with some question of a neck mass and thrust. CT shows Extensive mediastinal lymphadenopathy worrisome for nodal metastases.  No data recorded Assessment / Plan / Recommendation CHL IP CLINICAL IMPRESSIONS 11/06/2019 Clinical Impression  Pt demonstrates mild oropharyngeal dysphagia, appearing primarily due to decreased awareness and mentation. Pt kept her eyes closed during session but followed commands as needed, When taking sips, there were instances of brief oral holding and also slight premature spillage. Airway closure was not complete at times during the swallow leading to instances of flash frank penetration. Typically no cough was elicited during these events, but as study progressed pt coughed more and mroe often, not appearing to be related to pharyngeal impairment. Esophageal sweep showed some distal residue and pill needed several bolus to fully clear. Pt  is capable of regular solids and thin liquids but she should only eat and drink when fully alert. Will f/u for tolerance of diet and further modifications as needed.  SLP Visit Diagnosis Dysphagia, unspecified (R13.10) Attention and concentration deficit following -- Frontal lobe and executive function deficit following -- Impact on safety and function Mild aspiration risk;Risk for inadequate nutrition/hydration   CHL IP TREATMENT RECOMMENDATION 11/06/2019 Treatment Recommendations Therapy as outlined in treatment plan below   No flowsheet data found. CHL IP DIET RECOMMENDATION 11/06/2019 SLP Diet Recommendations -- Liquid Administration via Cup;Straw Medication Administration Whole meds with liquid Compensations -- Postural Changes Seated upright at 90 degrees;Remain semi-upright after after feeds/meals (Comment)   No flowsheet data found.  CHL IP FOLLOW UP RECOMMENDATIONS 11/06/2019 Follow up Recommendations 24 hour supervision/assistance   CHL IP FREQUENCY AND DURATION 11/06/2019 Speech Therapy Frequency (ACUTE ONLY) min 1 x/week Treatment Duration 1 week      CHL IP ORAL PHASE 11/06/2019 Oral Phase Impaired Oral - Pudding Teaspoon -- Oral - Pudding Cup -- Oral - Honey Teaspoon -- Oral - Honey Cup -- Oral - Nectar Teaspoon -- Oral - Nectar Cup -- Oral - Nectar Straw -- Oral - Thin Teaspoon Premature spillage;Decreased bolus cohesion Oral - Thin Cup -- Oral - Thin Straw Delayed oral transit Oral - Puree Delayed oral transit Oral - Mech Soft -- Oral - Regular Delayed oral transit Oral - Multi-Consistency -- Oral - Pill -- Oral Phase - Comment --  CHL IP PHARYNGEAL PHASE 11/06/2019 Pharyngeal Phase Impaired Pharyngeal- Pudding Teaspoon -- Pharyngeal -- Pharyngeal- Pudding Cup -- Pharyngeal -- Pharyngeal- Honey Teaspoon -- Pharyngeal --  Pharyngeal- Honey Cup -- Pharyngeal -- Pharyngeal- Nectar Teaspoon -- Pharyngeal -- Pharyngeal- Nectar Cup -- Pharyngeal -- Pharyngeal- Nectar Straw -- Pharyngeal -- Pharyngeal- Thin  Teaspoon Penetration/Aspiration before swallow;Penetration/Aspiration during swallow Pharyngeal Material enters airway, remains ABOVE vocal cords then ejected out Pharyngeal- Thin Cup -- Pharyngeal -- Pharyngeal- Thin Straw Penetration/Aspiration before swallow;Penetration/Aspiration during swallow Pharyngeal Material enters airway, CONTACTS cords and then ejected out;Material enters airway, remains ABOVE vocal cords then ejected out;Material does not enter airway Pharyngeal- Puree WFL Pharyngeal -- Pharyngeal- Mechanical Soft -- Pharyngeal -- Pharyngeal- Regular WFL Pharyngeal -- Pharyngeal- Multi-consistency -- Pharyngeal -- Pharyngeal- Pill WFL Pharyngeal -- Pharyngeal Comment --  No flowsheet data found. DeBlois, Katherene Ponto 11/06/2019, 2:48 PM              VAS [place] UPPER EXTREMITY VENOUS DUPLEX  Result Date: 11/07/2019 UPPER VENOUS STUDY  Indications: Edema Limitations: Body habitus, poor ultrasound/tissue interface and patient unable to cooperate. Patient position. Comparison Study: No prior study Performing Technologist: Maudry Mayhew MHA, RDMS, RVT, RDCS  Examination Guidelines: A complete evaluation includes B-mode imaging, spectral Doppler, color Doppler, and power Doppler as needed of all accessible portions of each vessel. Bilateral testing is considered an integral part of a complete examination. Limited examinations for reoccurring indications may be performed as noted.  Right Findings: +----------+------------+---------+-----------+----------+--------------+ RIGHT     CompressiblePhasicitySpontaneousProperties   Summary     +----------+------------+---------+-----------+----------+--------------+ Subclavian                                          Not visualized +----------+------------+---------+-----------+----------+--------------+  Left Findings: +----------+------------+---------+-----------+----------+--------------+ LEFT       CompressiblePhasicitySpontaneousProperties   Summary     +----------+------------+---------+-----------+----------+--------------+ IJV           Full       Yes       Yes                             +----------+------------+---------+-----------+----------+--------------+ Subclavian    Full       Yes       Yes                             +----------+------------+---------+-----------+----------+--------------+ Axillary      Full       Yes       Yes                             +----------+------------+---------+-----------+----------+--------------+ Brachial      Full       Yes       Yes                             +----------+------------+---------+-----------+----------+--------------+ Radial        Full                                                 +----------+------------+---------+-----------+----------+--------------+ Ulnar  Not visualized +----------+------------+---------+-----------+----------+--------------+ Cephalic      Full                                                 +----------+------------+---------+-----------+----------+--------------+ Basilic                                             Not visualized +----------+------------+---------+-----------+----------+--------------+  Summary:  Left: No evidence of deep vein thrombosis in the upper extremity. No evidence of superficial vein thrombosis in the upper extremity. This study was limited due to technical limitations listed above.  *See table(s) above for measurements and observations.  Diagnosing physician: Servando Snare MD Electronically signed by Servando Snare MD on 11/07/2019 at 3:03:30 PM.    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 EXAM: ULTRASOUND AND  FLUOROSCOPIC GUIDED PICC LINE INSERTION MEDICATIONS: 1% lidocaine to skin and subcutaneous tissue ANESTHESIA/SEDATION: None FLUOROSCOPY TIME:  Fluoroscopy Time:  42 seconds (7 mGy). COMPLICATIONS: None immediate. TECHNIQUE: The procedure, risks, benefits, and alternatives were explained to the patient and informed written consent was obtained. A timeout was performed prior to the initiation of the procedure. The right upper extremity was prepped with chlorhexidine in a sterile fashion, and a sterile drape was applied covering the operative field. Maximum barrier sterile technique with sterile gowns and gloves were used for the procedure. A timeout was performed prior to the initiation of the procedure. Local anesthesia was provided with 1% lidocaine. Under direct ultrasound guidance, the right brachial vein was accessed with a micropuncture kit after the overlying soft tissues were anesthetized with 1% lidocaine. An ultrasound image was saved for documentation purposes. A guidewire was advanced to the level of the superior caval-atrial junction for measurement purposes and the PICC line was cut to length. A peel-away sheath was placed and a 39 cm, 5 [place], dual lumen was inserted to level of the superior caval-atrial junction. A post procedure spot fluoroscopic was obtained. The catheter easily aspirated and flushed and was sutured in place. A dressing was placed. The patient tolerated the procedure well without immediate post procedural complication. FINDINGS: After catheter placement, the tip lies within the superior cavoatrial junction the catheter aspirates and flushes normally and is ready for immediate use. IMPRESSION: Successful ultrasound and fluoroscopic guided placement of a right brachial vein approach, 39 cm, 5 French, dual lumen PICC with tip at the superior caval-atrial junction. The PICC line is ready for immediate use. Read by: Rowe Robert, PA-C Electronically Signed   By: Aletta Edouard M.D.    On: 11/10/2019 13:39   [place] EKG SITE RITE  Result Date: 11/07/2019 If Site Rite image not attached, placement could not be confirmed due to current cardiac rhythm.  US Abdomen Limited RUQ  Result Date: 11/14/2019 CLINICAL DATA:  75 year old female with transaminitis. History of breast cancer. EXAM: ULTRASOUND ABDOMEN LIMITED RIGHT UPPER QUADRANT COMPARISON:  CT of the abdomen pelvis dated 10/29/2019. FINDINGS: Gallbladder: Cholecystectomy. Common bile duct: Poorly visualized. Liver: The liver demonstrates a heterogeneous echotexture with innumerable hypoechoic lesions consistent with metastatic disease. 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 Portal vein is patent on color Doppler imaging with normal direction of blood flow towards the liver. Other: There is a small perihepatic ascites. Moderate right renal atrophy. IMPRESSION: 1. Extensive hepatic metastatic disease. 2. Small perihepatic ascites. 3. Patent main portal vein with hepatopetal flow. Electronically Signed   By: Anner Crete M.D.   On: 11/14/2019 16:37      Procedures: liver biopsy   Subjective: Patient feeling very weak and deconditioned, no nausea or vomiting, no chest pain or dyspnea.   Discharge Exam: Vitals:   11/19/19 1100 11/19/19 1200  BP: (!) 105/50 (!) 114/56  Pulse: (!) 103 (!) 103  Resp: 19 19  Temp:  (!) 97.4 F (36.3 C)  SpO2: 98% 97%   Vitals:   11/19/19 0900 11/19/19 1000 11/19/19 1100 11/19/19 1200  BP: (!) 113/49 (!) 101/46 (!) 105/50 (!) 114/56  Pulse: (!) 101 95 (!) 103 (!) 103  Resp: 17 15 19 19   Temp:    (!) 97.4 F (36.3 C)  TempSrc:    Oral  SpO2: 99% 100% 98% 97%  Weight:      Height:        General: deconditioned and ill looking appearing  Neurology: Awake and alert, non focal  E ENT: positive pallor, no icterus, oral mucosa dry Cardiovascular: No JVD. S1-S2 present, rhythmic, no gallops, rubs, or murmurs.  +++ all 4 extremities pitting edema.   Pulmonary: decreased breath sounds bilaterally, adequate air movement, no wheezing, rhonchi or rales. Gastrointestinal. Abdomen protuberant with no organomegaly, non tender, no rebound or guarding Skin. No rashes Musculoskeletal: no joint deformities   The results of significant diagnostics from this hospitalization (including imaging, microbiology, ancillary and laboratory) are listed below for reference.     Microbiology: Recent Results (from the past 240 hour(s))  Culture, Urine     Status: Abnormal   Collection Time: 11/11/19  6:45 PM   Specimen: Urine, Random  Result Value Ref Range Status   Specimen Description   Final    URINE, RANDOM Performed at [facility] [address] [place], [place] [address]    Special Requests   Final    NONE Performed at [facility], [place] [address], [place], [place] [address]    Culture (A)  Final    >=100,000 COLONIES/mL PROTEUS VULGARIS >=100,000 COLONIES/mL ESCHERICHIA COLI    Report Status 11/14/2019 FINAL  Final   Organism ID, Bacteria PROTEUS VULGARIS (A)  Final   Organism ID, Bacteria ESCHERICHIA COLI (A)  Final      Susceptibility   Escherichia coli - MIC*    AMPICILLIN 4 SENSITIVE Sensitive     CEFAZOLIN <=4 SENSITIVE Sensitive     CEFTRIAXONE <=1 SENSITIVE Sensitive     CIPROFLOXACIN <=0.25 SENSITIVE Sensitive     GENTAMICIN <=1 SENSITIVE Sensitive     IMIPENEM <=0.25 SENSITIVE Sensitive     NITROFURANTOIN <=16 SENSITIVE Sensitive     TRIMETH/SULFA <=20 SENSITIVE Sensitive     AMPICILLIN/SULBACTAM <=2 SENSITIVE Sensitive     PIP/TAZO <=4 SENSITIVE Sensitive     * >=100,000 COLONIES/mL ESCHERICHIA COLI   Proteus vulgaris - MIC*    AMPICILLIN >=32 RESISTANT Resistant     CEFAZOLIN >=64 RESISTANT Resistant     CEFTRIAXONE <=1 SENSITIVE Sensitive     CIPROFLOXACIN <=0.25 SENSITIVE Sensitive     GENTAMICIN <=1 SENSITIVE Sensitive     IMIPENEM 2 SENSITIVE Sensitive     NITROFURANTOIN  RESISTANT Resistant  TRIMETH/SULFA <=20 SENSITIVE Sensitive     AMPICILLIN/SULBACTAM 8 SENSITIVE Sensitive     PIP/TAZO <=4 SENSITIVE Sensitive     * >=100,000 COLONIES/mL PROTEUS VULGARIS  MRSA PCR Screening     Status: None   Collection Time: 11/17/19  8:58 PM   Specimen: Nasal Mucosa; Nasopharyngeal  Result Value Ref Range Status   MRSA by PCR NEGATIVE NEGATIVE Final    Comment:        The GeneXpert MRSA Assay (FDA approved for NASAL specimens only), is one component of a comprehensive MRSA colonization surveillance program. It is not intended to diagnose MRSA infection nor to guide or monitor treatment for MRSA infections. Performed at [facility], [place] [address], [place], [place] [address]   Culture, blood (Routine X 2) w Reflex to ID Panel     Status: None (Preliminary result)   Collection Time: 11/18/19  9:00 AM   Specimen: BLOOD  Result Value Ref Range Status   Specimen Description   Final    BLOOD LEFT ARM Performed at [facility] [address] [place], [place] [address]    Special Requests   Final    BOTTLES DRAWN AEROBIC AND ANAEROBIC Blood Culture results may not be optimal due to an inadequate volume of blood received in culture bottles Performed at [facility] [address], [place], [place] [address]    Culture   Final    NO GROWTH < 24 HOURS Performed at [facility] [address], [place], [place] [address]    Report Status PENDING  Incomplete  Culture, blood (Routine X 2) w Reflex to ID Panel     Status: None (Preliminary result)   Collection Time: 11/18/19 10:37 AM   Specimen: BLOOD LEFT HAND  Result Value Ref Range Status   Specimen Description   Final    BLOOD LEFT HAND Performed at [facility] [address], [place], [place] [address]    Special Requests   Final    BOTTLES DRAWN AEROBIC ONLY Blood Culture adequate volume Performed at [facility] [address], [place], [place] [address]    Culture   Final    NO GROWTH < 24 HOURS Performed at [facility] [address], [place], [place] [address]    Report Status PENDING  Incomplete     Labs: BNP (last 3 results) Recent Labs    06/17/19 1248  BNP 384.5*   Basic Metabolic Panel: Recent Labs  Lab 11/13/19 0500 11/14/19 0705 11/15/19 0557 11/16/19 0559 11/17/19 0634 11/18/19 0517 11/19/19 0436  NA 137   < > 139 144 142 143 144  K 4.8   < > 3.9 3.9 3.4* 3.2* 2.6*  CL 103   < > 105 109 105 107 108  CO2 19*   < > 23 23 25  20* 22  GLUCOSE 125*   < > 106* 129* 139* 132* 141*  BUN 32*   < > 39* 39* 37* 43* 43*  CREATININE 0.90   < > 0.68 0.70 0.67 0.86 0.64  CALCIUM 7.8*   < > 7.9* 8.4* 8.3* 8.0* 7.9*  MG 1.8  --  1.9  --   --  1.7  --    < > = values in this interval not displayed.   Liver Function Tests: Recent Labs  Lab 11/14/19 0705 11/15/19 0557 11/16/19 0559 11/17/19 0634 11/19/19 0436  AST 202* 188* 197* 181* 168*  ALT 74* 64* 68* 62* 51*  ALKPHOS 1,325* 1,112* 1,185* 995* 684*  BILITOT 3.7* 4.9* 4.4* 4.4* 6.0*  PROT 4.7* 4.8* 4.9* 4.5* 4.5*  ALBUMIN 2.3* 2.5* 2.3* 2.3* 2.0*   No results for input(s): LIPASE, AMYLASE in the last 168 hours. No results for input(s): AMMONIA in the last 168 hours. CBC: Recent Labs  Lab 11/14/19 0705 11/15/19 0557 11/17/19 1026 11/18/19 0517 11/19/19 0436  WBC 18.2* 17.4* 0.9* 0.8* 1.1*  NEUTROABS  --   --  0.5* 0.2* 0.4*  HGB 9.3* 8.4* 8.5* 6.8* 7.9*  HCT 30.4* 28.7* 28.0* 22.2* 25.5*  MCV 104.1* 104.0* 103.3* 105.7* 102.4*  PLT 195 149* 92* 63* 59*   Cardiac Enzymes: No results for input(s): CKTOTAL, CKMB, CKMBINDEX, TROPONINI in the last 168 hours. BNP: Invalid input(s): POCBNP CBG: Recent Labs  Lab 11/17/19 2046  GLUCAP 150*   D-Dimer No results for input(s): DDIMER in the last 72 hours. Hgb A1c No results for input(s): HGBA1C in the last 72 hours. Lipid Profile No  results for input(s): CHOL, HDL, LDLCALC, TRIG, CHOLHDL, LDLDIRECT in the last 72 hours. Thyroid function studies No results for input(s): TSH, T4TOTAL, T3FREE, THYROIDAB in the last 72 hours.  Invalid input(s): FREET3 Anemia work up No results for input(s): VITAMINB12, FOLATE, FERRITIN, TIBC, IRON, RETICCTPCT in the last 72 hours. Urinalysis    Component Value Date/Time   COLORURINE AMBER (A) 11/11/2019 1845   APPEARANCEUR CLEAR 11/11/2019 1845   LABSPEC 1.024 11/11/2019 1845   PHURINE 6.0 11/11/2019 1845   GLUCOSEU NEGATIVE 11/11/2019 1845   HGBUR NEGATIVE 11/11/2019 1845   BILIRUBINUR NEGATIVE 11/11/2019 1845   KETONESUR 5 (A) 11/11/2019 1845   PROTEINUR 30 (A) 11/11/2019 1845   UROBILINOGEN 1.0 07/11/2012 1023   NITRITE NEGATIVE 11/11/2019 1845   LEUKOCYTESUR LARGE (A) 11/11/2019 1845   Sepsis Labs Invalid input(s): PROCALCITONIN,  WBC,  LACTICIDVEN Microbiology Recent Results (from the past 240 hour(s))  Culture, Urine     Status: Abnormal   Collection Time: 11/11/19  6:45 PM   Specimen: Urine, Random  Result Value Ref Range Status   Specimen Description   Final    URINE, RANDOM Performed at [facility], [place] [address], [place], [place] [address]    Special Requests   Final    NONE Performed at [facility], [place] [address] [place], [place] [address]    Culture (A)  Final    >=100,000 COLONIES/mL PROTEUS VULGARIS >=100,000 COLONIES/mL ESCHERICHIA COLI    Report Status 11/14/2019 FINAL  Final   Organism ID, Bacteria PROTEUS VULGARIS (A)  Final   Organism ID, Bacteria ESCHERICHIA COLI (A)  Final      Susceptibility   Escherichia coli - MIC*    AMPICILLIN 4 SENSITIVE Sensitive     CEFAZOLIN <=4 SENSITIVE Sensitive     CEFTRIAXONE <=1 SENSITIVE Sensitive     CIPROFLOXACIN <=0.25 SENSITIVE Sensitive     GENTAMICIN <=1 SENSITIVE Sensitive     IMIPENEM <=0.25 SENSITIVE Sensitive     NITROFURANTOIN <=16 SENSITIVE Sensitive      TRIMETH/SULFA <=20 SENSITIVE Sensitive     AMPICILLIN/SULBACTAM <=2 SENSITIVE Sensitive     PIP/TAZO <=4 SENSITIVE Sensitive     * >=100,000 COLONIES/mL ESCHERICHIA COLI   Proteus vulgaris - MIC*    AMPICILLIN >=32 RESISTANT Resistant     CEFAZOLIN >=64 RESISTANT Resistant     CEFTRIAXONE <=1 SENSITIVE Sensitive     CIPROFLOXACIN <=0.25 SENSITIVE Sensitive     GENTAMICIN <=  1 SENSITIVE Sensitive     IMIPENEM 2 SENSITIVE Sensitive     NITROFURANTOIN RESISTANT Resistant     TRIMETH/SULFA <=20 SENSITIVE Sensitive     AMPICILLIN/SULBACTAM 8 SENSITIVE Sensitive     PIP/TAZO <=4 SENSITIVE Sensitive     * >=100,000 COLONIES/mL PROTEUS VULGARIS  MRSA PCR Screening     Status: None   Collection Time: 11/17/19  8:58 PM   Specimen: Nasal Mucosa; Nasopharyngeal  Result Value Ref Range Status   MRSA by PCR NEGATIVE NEGATIVE Final    Comment:        The GeneXpert MRSA Assay (FDA approved for NASAL specimens only), is one component of a comprehensive MRSA colonization surveillance program. It is not intended to diagnose MRSA infection nor to guide or monitor treatment for MRSA infections. Performed at [facility], [place] [address] [place], [place] [address]   Culture, blood (Routine X 2) w Reflex to ID Panel     Status: None (Preliminary result)   Collection Time: 11/18/19  9:00 AM   Specimen: BLOOD  Result Value Ref Range Status   Specimen Description   Final    BLOOD LEFT ARM Performed at [facility] [address], [place], [place] [address]    Special Requests   Final    BOTTLES DRAWN AEROBIC AND ANAEROBIC Blood Culture results may not be optimal due to an inadequate volume of blood received in culture bottles Performed at [facility] [address] [place], [place] [address]    Culture   Final    NO GROWTH < 24 HOURS Performed at [facility] [address], [place], [place] [address]    Report Status PENDING   Incomplete  Culture, blood (Routine X 2) w Reflex to ID Panel     Status: None (Preliminary result)   Collection Time: 11/18/19 10:37 AM   Specimen: BLOOD LEFT HAND  Result Value Ref Range Status   Specimen Description   Final    BLOOD LEFT HAND Performed at [facility] [address], [place], [place] [address]    Special Requests   Final    BOTTLES DRAWN AEROBIC ONLY Blood Culture adequate volume Performed at [facility] [address], [place], [place] [address]    Culture   Final    NO GROWTH < 24 HOURS Performed at [facility] [address] [place], [place] [address]    Report Status PENDING  Incomplete     Time coordinating discharge: 45 minutes  SIGNED:   Tawni Millers, MD  [facility] 11/19/2019, 1:20 PM

## 2019-11-19 NOTE — Telephone Encounter (Signed)
Husband left VM asking to speak w/Dr. Benay Spice or his PA. Reports he missed him when he came in this morning. Called husband back and left VM that MD is out of town until Monday. Will have him call when he returns. Requested he call back if he needs someone to speak with his prior to this and can ask Dr. Marin Olp to call him, since it was arranged for him to follow her in Dr. Gearldine Shown absence.

## 2019-11-19 NOTE — Consult Note (Signed)
NAME:  [name], MRN:  [id number], DOB:  May 20, 1946, LOS: 29 ADMISSION DATE:  10/29/2019, CONSULTATION DATE:  11/18/2019 REFERRING MD:  Dr. Neysa Bonito, CHIEF COMPLAINT:  Hypotension    Brief History   74 year old female admitted with waxing and waning abdominal pain x3 months which has worsened over the last 3 weeks prior to admission.  Found to have hepatic metastasis, omental caking, and complex cystic left lower quadrant mass with retroperitoneal adenopathy.  PCCM consulted for management of hypotension.  History of present illness   [name] is a 74 year old female with history of type 2 diabetes, temporal arteritis, migraines, obstructive sleep apnea, and cervical radiculopathy who presented with waxing and waning complaints of abdominal pain x3 months which worsened 3 weeks prior to admission.  PCP encouraged presentation to ED for concern of underlying malignancy.  Found to have hepatic metastasis, omental caking, complex cystic left lower quadrant mass, and retroperitoneal adenopathy on admission.  Underwent biopsy by IR on 5/14 which was consistent with primary ovarian or primary peritoneal serous sarcoma.  Oncology and palliative care have been consulted.  Patient was started on chemotherapy during this hospitalization.  MRI of 6/1 patient was transferred to stepdown unit for elevated lactic acid of 7 coupled with hypotension.  Of note hemoglobin 6.8, 1 unit packed red blood cells currently infusing.  PCCM consulted for management of hypotension and concern of septic shock.  Past Medical History  Sleep apnea Obesity Hyperlipidemia GERD Type 2 diabetes Breast cancer Arthritis Anxiety  Significant Hospital Events   Admitted 5/12  Consults:  Oncology Palliative care PCCM  Procedures:  Ultrasound-guided core biopsy of liver lesion 5/14  Significant Diagnostic Tests:  Abdominal CT scan 5/12 > 1. Diffuse hepatic metastases. 2. Omental caking. Complex cystic mass left  lower quadrant consistent with necrotic lesion. 3. Pathologic retroperitoneal lymphadenopathy, most pronounced within the iliac distributions left greater than right. 4. Trace ascites.  CT chest without contrast 5/13 > 1. Nonspecific sub solid 5 mm right upper lobe pulmonary nodule. Neoplasm cannot be excluded in light of findings elsewhere within the chest and abdomen. 2. Extensive mediastinal lymphadenopathy worrisome for nodal metastases. 3. Aortic Atherosclerosis (ICD10-I70.0) and Emphysema (ICD10-J43.9). 4. Please refer to recent abdominal CT describing diffuse hepatic metastases.  MRI brain 5/14> Suboptimal evaluation due to motion artifact. No evidence of intracranial metastatic disease. No acute infarction or hemorrhage.  Abdominal ultrasound 5/28 > 1. Extensive hepatic metastatic disease. 2. Small perihepatic ascites. 3. Patent main portal vein with hepatopetal flow.   Micro Data:  COVID 5/12 > Negative Urine culture 5/25 > Positive Proteus and E.Coli > Resistant to Ampicillin, Cefazolin, Nitro Blood culture 5/31 > Negative  MRSA PCR 5/31 > Negative  Blood culture 6/1 >  Antimicrobials:  Cefepime 5/31 > Vancomycin 5/31  Interim history/subjective:   Very weak Laying in bed comfortably States that she wore her CPAP overnight last night  Objective   Blood pressure (!) 116/57, pulse (!) 103, temperature 98.6 F (37 C), temperature source Axillary, resp. rate 19, height 5\' 4"  (1.626 m), weight 123.3 kg, SpO2 98 %.        Intake/Output Summary (Last 24 hours) at 11/19/2019 0816 Last data filed at 11/19/2019 0600 Gross per 24 hour  Intake 613.29 ml  Output 1125 ml  Net -511.71 ml   Filed Weights   11/08/19 2139 11/09/19 1609 11/19/19 0500  Weight: 120.1 kg 123.4 kg 123.3 kg    Examination: General: Obese chronically ill-appearing debilitated woman.  No distress HEENT:  Oropharynx moist, pupils equal without icterus Neuro: She wakes easily to voice,  interacts appropriately, globally weak, moves upper extremities and lower extremities CV: Borderline tachycardic 105, distant, no murmur PULM: Very decreased at bilateral bases anterolaterally, no wheeze, no crackles GI: Obese, nondistended, positive bowel sounds Extremities: 2+ lower extremity edema Skin: Some erythema on bilateral shins but no evidence for cellulitis  Resolved Hospital Problem list     Assessment & Plan:   Multifactorial shock, principally septic shock in the setting of metastatic carcinoma, pancytopenia following chemotherapy.  Source appears to be urinary tract infection (E. coli, Proteus).  Other contributors to shock include her anemia.  Hemodynamically improved with IV fluid/albumin, 1 unit PRBC.  Hemoglobin rebounded to 7.9.  Lactic acid slowly clearing as of 6/1. Recs: Continue antibiotics.  Consider DC vancomycin as her WBC rebounds given culture data Volume resuscitation as able.  Note that she was aggressively diuresed earlier in the hospitalization given her anasarca Would like to avoid vasopressors if possible.  If necessary then would limit to peripheral IV administration. Based on discussions with patient and family 6/1 at bedside recommend medical care but avoid invasive maneuvers such as CVC placement.  She is clear that she would not want CPR, intubation, MV, etc.  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  Dr. Benay Spice is following and dosing chemotherapy based on her hepatic function which appears to be currently stable.  No further hypercalcemia noted.  Unclear whether she will be able to continue to tolerate.  Following for response to her initial cycle of therapy.  Pancytopenia.  Received Granix.  Following CBC closely  Hypokalemia.  Replaced this morning 6/2.  Follow BMP   Best practice:  Diet: Regular diet Pain/Anxiety/Delirium protocol (if indicated): As needed VAP protocol (if indicated): Not applicable DVT prophylaxis: SCDs GI  prophylaxis: PPI Glucose control: Monitor Mobility: Bedrest Code Status: DNR Family Communication: Updated family at bedside on 6/1 Disposition: SDU status. Hopefully she can move out 6/2 or 6/3 if remains hemodynamically stable.   PCCM will sign off. Please call if we can assist further.   Labs   CBC: Recent Labs  Lab 11/14/19 0705 11/15/19 0557 11/17/19 1026 11/18/19 0517 11/19/19 0436  WBC 18.2* 17.4* 0.9* 0.8* 1.1*  NEUTROABS  --   --  0.5* 0.2*  --   HGB 9.3* 8.4* 8.5* 6.8* 7.9*  HCT 30.4* 28.7* 28.0* 22.2* 25.5*  MCV 104.1* 104.0* 103.3* 105.7* 102.4*  PLT 195 149* 92* 63* 59*    Basic Metabolic Panel: Recent Labs  Lab 11/13/19 0500 11/14/19 0705 11/15/19 0557 11/16/19 0559 11/17/19 0634 11/18/19 0517 11/19/19 0436  NA 137   < > 139 144 142 143 144  K 4.8   < > 3.9 3.9 3.4* 3.2* 2.6*  CL 103   < > 105 109 105 107 108  CO2 19*   < > 23 23 25  20* 22  GLUCOSE 125*   < > 106* 129* 139* 132* 141*  BUN 32*   < > 39* 39* 37* 43* 43*  CREATININE 0.90   < > 0.68 0.70 0.67 0.86 0.64  CALCIUM 7.8*   < > 7.9* 8.4* 8.3* 8.0* 7.9*  MG 1.8  --  1.9  --   --  1.7  --    < > = values in this interval not displayed.   GFR: Estimated Creatinine Clearance: 82.4 mL/min (by C-G formula based on SCr of 0.64 mg/dL). Recent Labs  Lab 11/15/19 0557 11/17/19 1026 11/17/19 1834  11/17/19 2255 11/18/19 0517 11/18/19 1200 11/18/19 1600 11/19/19 0436  WBC 17.4* 0.9*  --   --  0.8*  --   --  1.1*  LATICACIDVEN  --   --  6.6* 7.0*  --  5.5* 5.2*  --     Liver Function Tests: Recent Labs  Lab 11/14/19 0705 11/15/19 0557 11/16/19 0559 11/17/19 0634 11/19/19 0436  AST 202* 188* 197* 181* 168*  ALT 74* 64* 68* 62* 51*  ALKPHOS 1,325* 1,112* 1,185* 995* 684*  BILITOT 3.7* 4.9* 4.4* 4.4* 6.0*  PROT 4.7* 4.8* 4.9* 4.5* 4.5*  ALBUMIN 2.3* 2.5* 2.3* 2.3* 2.0*   No results for input(s): LIPASE, AMYLASE in the last 168 hours. Recent Labs  Lab 11/12/19 1130  AMMONIA 47*     ABG    Component Value Date/Time   PHART 7.426 11/01/2019 1145   PCO2ART 36.0 11/01/2019 1145   PO2ART 71.4 (L) 11/01/2019 1145   HCO3 23.3 11/01/2019 1145   TCO2 33 (H) 06/17/2019 1723   ACIDBASEDEF 0.5 11/01/2019 1145   O2SAT 94.6 11/01/2019 1145     Coagulation Profile: Recent Labs  Lab 11/17/19 1600  INR 1.5*    Cardiac Enzymes: No results for input(s): CKTOTAL, CKMB, CKMBINDEX, TROPONINI in the last 168 hours.  HbA1C: Hgb A1c MFr Bld  Date/Time Value Ref Range Status  11/07/2019 03:40 PM 4.1 (L) 4.8 - 5.6 % Final    Comment:    (NOTE) Pre diabetes:          5.7%-6.4% Diabetes:              >6.4% Glycemic control for   <7.0% adults with diabetes   10/29/2019 11:29 PM 4.8 4.8 - 5.6 % Final    Comment:    (NOTE) Pre diabetes:          5.7%-6.4% Diabetes:              >6.4% Glycemic control for   <7.0% adults with diabetes     CBG: Recent Labs  Lab 11/17/19 2046  GLUCAP 150*     Signature:    Baltazar Apo, MD, PhD 11/19/2019, 8:31 AM  Pulmonary and Critical Care [phone] or if no answer [phone]

## 2019-11-19 NOTE — TOC Transition Note (Addendum)
Transition of Care [facility]) - CM/SW Discharge Note   Patient Details  Name: [name] MRN: [id number] Date of Birth: 1946-05-18  [facility] CM/SW Contact:  Leeroy Cha, RN Phone Number: 11/19/2019, 12:45 PM   Clinical Narrative:    tct-Cherise Heber Eunice with [facility]/did get a cfall from Dr. Nancy Nordmann and is working on certification.  They do have a bed available. TCF-cHERISE/patient may go at 1630pm via p-tar. 1500 -ptar called for 1630 pickup time,  number to call report to is [phone]. Floor nurse given packet and information.  Final next level of care: [place] Barriers to Discharge: Barriers Resolved   Patient Goals and CMS Choice   CMS Medicare.gov Compare Post Acute Care list provided to:: Patient Choice offered to / list presented to : Patient, Spouse  Discharge Placement                       Discharge Plan and Services                                     Social Determinants of Health (SDOH) Interventions     Readmission Risk Interventions Readmission Risk Prevention Plan 11/10/2019  Transportation Screening Complete  PCP or Specialist Appt within 3-5 Days Complete  HRI or [place] Complete  Social Work Consult for [place] Planning/Counseling Complete  Palliative Care Screening Complete  Medication Review [employer]) Complete  Some recent data might be hidden

## 2019-11-19 NOTE — Progress Notes (Signed)
HEMATOLOGY-ONCOLOGY PROGRESS NOTE  SUBJECTIVE: She is alert this morning.  No pain or nausea.  Her family is not present.  PHYSICAL EXAMINATION:  Vitals:   11/19/19 0630 11/19/19 0700  BP: (!) 103/49 (!) 116/57  Pulse: (!) 108 (!) 103  Resp: 19 19  Temp:    SpO2: 99% 98%   Filed Weights   11/08/19 2139 11/09/19 1609 11/19/19 0500  Weight: 264 lb 12.6 oz (120.1 kg) 272 lb (123.4 kg) 271 lb 13.2 oz (123.3 kg)    Intake/Output from previous day: 06/01 0701 - 06/02 0700 In: 613.3 [Blood:468; IV Piggyback:145.3] Out: 1125 [Urine:1125] HEENT: No thrush or ulcers Lungs: Clear anteriorly Cardiac: Regular rate and rhythm ABDOMEN: Soft, liver edge palpable in the upper abdomen, mild right upper abdomen tenderness, erythema at the right and lower abdomen pannus NEURO: Alert, not oriented to year.  Oriented to diagnosis and place Vascular: Pitting edema to the bilateral upper and lower extremities  PICC without erythema or drainage   LABORATORY DATA:  I have reviewed the data as listed CMP Latest Ref Rng & Units 11/19/2019 11/18/2019 11/17/2019  Glucose 70 - 99 mg/dL 141(H) 132(H) 139(H)  BUN 8 - 23 mg/dL 43(H) 43(H) 37(H)  Creatinine 0.44 - 1.00 mg/dL 0.64 0.86 0.67  Sodium 135 - 145 mmol/L 144 143 142  Potassium 3.5 - 5.1 mmol/L 2.6(LL) 3.2(L) 3.4(L)  Chloride 98 - 111 mmol/L 108 107 105  CO2 22 - 32 mmol/L 22 20(L) 25  Calcium 8.9 - 10.3 mg/dL 7.9(L) 8.0(L) 8.3(L)  Total Protein 6.5 - 8.1 g/dL 4.5(L) - 4.5(L)  Total Bilirubin 0.3 - 1.2 mg/dL 6.0(H) - 4.4(H)  Alkaline Phos 38 - 126 U/L 684(H) - 995(H)  AST 15 - 41 U/L 168(H) - 181(H)  ALT 0 - 44 U/L 51(H) - 62(H)    Lab Results  Component Value Date   WBC 1.1 (LL) 11/19/2019   HGB 7.9 (L) 11/19/2019   HCT 25.5 (L) 11/19/2019   MCV 102.4 (H) 11/19/2019   PLT 59 (L) 11/19/2019   NEUTROABS 0.2 (L) 11/18/2019    CT CHEST WO CONTRAST  Result Date: 10/30/2019 CLINICAL DATA:  Metastatic malignancy EXAM: CT CHEST WITHOUT  CONTRAST TECHNIQUE: Multidetector CT imaging of the chest was performed following the standard protocol without IV contrast. COMPARISON:  10/29/2019, 06/18/2019 FINDINGS: Cardiovascular: No pericardial effusion. Normal caliber of the thoracic aorta. Minimal atherosclerosis. Mediastinum/Nodes: Numerous enlarged mediastinal lymph nodes are identified. Index lymph node in the right paratracheal region measures 21 x 21 mm, previous having measured 27 x 22 mm. Increased number of enlarged lymph nodes throughout the mediastinum compared to prior neck CT. The trachea, esophagus, and thyroid are unremarkable. Lungs/Pleura: There is a small spiculated sub solid nodule in the right upper lobe image 35, measuring 5 mm. No other pulmonary nodules or masses. Mild upper lobe predominant emphysema. Hypoventilatory changes at the lung bases. No large effusion or pneumothorax. Upper Abdomen: Please refer to recent abdominal CT describing numerous hepatic metastases as well as omental caking and trace ascites. Musculoskeletal: There are no acute or destructive bony lesions. Reconstructed images demonstrate no additional findings. IMPRESSION: 1. Nonspecific sub solid 5 mm right upper lobe pulmonary nodule. Neoplasm cannot be excluded in light of findings elsewhere within the chest and abdomen. 2. Extensive mediastinal lymphadenopathy worrisome for nodal metastases. 3. Aortic Atherosclerosis (ICD10-I70.0) and Emphysema (ICD10-J43.9). 4. Please refer to recent abdominal CT describing diffuse hepatic metastases. Electronically Signed   By: Randa Ngo M.D.   On: 10/30/2019  21:56   MR BRAIN W WO CONTRAST  Result Date: 10/31/2019 CLINICAL DATA:  Weakness, metastases EXAM: MRI HEAD WITHOUT AND WITH CONTRAST TECHNIQUE: Multiplanar, multiecho pulse sequences of the brain and surrounding structures were obtained without and with intravenous contrast. CONTRAST:  4m GADAVIST GADOBUTROL 1 MMOL/ML IV SOLN COMPARISON:  09/30/2018 FINDINGS:  Motion artifact is present Brain: There is no acute infarction or intracranial hemorrhage. There is no intracranial mass, mass effect, or edema. There is no hydrocephalus or extra-axial fluid collection. Ventricles and sulci are stable in size and configuration. Patchy T2 hyperintensity in the supratentorial and pontine white matter is nonspecific but probably reflects mild chronic microvascular ischemic changes. No abnormal enhancement. Vascular: Major vessel flow voids at the skull base are preserved. Skull and upper cervical spine: Normal marrow signal is preserved. There is susceptibility artifact related to partially imaged cervical spine fusion. Sinuses/Orbits: Paranasal sinuses are aerated. Orbits are unremarkable. Other: Sella is unremarkable.  Mastoid air cells are clear. IMPRESSION: Suboptimal evaluation due to motion artifact. No evidence of intracranial metastatic disease. No acute infarction or hemorrhage. Electronically Signed   By: PMacy MisM.D.   On: 10/31/2019 14:24   CT ABDOMEN PELVIS W CONTRAST  Result Date: 10/29/2019 CLINICAL DATA:  Hyperkalemia,, liver disease, emesis, weight loss EXAM: CT ABDOMEN AND PELVIS WITH CONTRAST TECHNIQUE: Multidetector CT imaging of the abdomen and pelvis was performed using the standard protocol following bolus administration of intravenous contrast. CONTRAST:  728mOMNIPAQUE IOHEXOL 300 MG/ML  SOLN COMPARISON:  06/17/2019, 01/05/2006 FINDINGS: Lower chest: No acute pleural or parenchymal lung disease. Hepatobiliary: There are innumerable masses replacing the majority of the liver parenchyma consistent with diffuse metastatic disease. The gallbladder is surgically absent. Pancreas: Unremarkable. No pancreatic ductal dilatation or surrounding inflammatory changes. Spleen: Normal in size without focal abnormality. Adrenals/Urinary Tract: Adrenal glands are unremarkable. Kidneys are normal, without renal calculi, focal lesion, or hydronephrosis. Bladder is  unremarkable. Stomach/Bowel: No bowel obstruction or ileus. No bowel wall thickening or inflammatory changes. Vascular/Lymphatic: There is pathologic adenopathy within the retroperitoneum. Largest pathologic lymph node in the left external iliac chain reference image 77 measures 3.5 x 3.1 cm. A second index lymph node at the right iliac bifurcation measures 2.6 x 2.2 cm reference image 62. There is minimal atherosclerosis of the aorta. Reproductive: Uterus is surgically absent. I do not see any adnexal lesions. Other: There is soft tissue fat stranding throughout the ventral omentum consistent with omental caking. Complex cystic mass within the left lower quadrant along the anterior abdominal wall measuring 5.7 x 7.2 cm is most consistent with a necrotic mass. Mural thickening along the anterior and lateral margin of the mass. There is trace free fluid within the upper abdomen surrounding the liver and spleen. No free intra-abdominal gas. Musculoskeletal: There are no acute or destructive bony lesions. Reconstructed images demonstrate no additional findings. IMPRESSION: 1. Diffuse hepatic metastases. 2. Omental caking. Complex cystic mass left lower quadrant consistent with necrotic lesion. 3. Pathologic retroperitoneal lymphadenopathy, most pronounced within the iliac distributions left greater than right. 4. Trace ascites. Electronically Signed   By: MiRanda Ngo.D.   On: 10/29/2019 22:15   [place]OPSY (LIVER)  Result Date: 10/31/2019 INDICATION: [age]ear old female with multifocal hepatic lesions concerning for metastatic disease. She presents for ultrasound-guided biopsy of the same. EXAM: ULTRASOUND BIOPSY CORE LIVER MEDICATIONS: None. ANESTHESIA/SEDATION: Moderate (conscious) sedation was employed during this procedure. A total of Versed 1 mg and Fentanyl 50 mcg was administered intravenously. Moderate Sedation Time: 13  minutes. The patient's level of consciousness and vital signs were monitored  continuously by radiology nursing throughout the procedure under my direct supervision. FLUOROSCOPY TIME:  None. COMPLICATIONS: None immediate. PROCEDURE: Informed written consent was obtained from the patient after a thorough discussion of the procedural risks, benefits and alternatives. All questions were addressed. A timeout was performed prior to the initiation of the procedure. The liver was interrogated with ultrasound. There are innumerable hypoechoic solid lesions scattered throughout the liver. A suitable lesion in the left hepatic lobe was identified. The overlying skin was sterilely prepped and draped in the standard fashion. Local anesthesia was attained by infiltration with 1% lidocaine. A small dermatotomy was made. Under real-time ultrasound guidance, multiple 18 gauge core biopsies were obtained using the bio Pince automated biopsy device. Biopsies were performed coaxially through a 17 gauge introducer needle. As the introducer needle was withdrawn, the biopsy tract was embolized with a Gel-Foam slurry. Post ultrasound imaging demonstrates no evidence of immediate complication. IMPRESSION: Technically successful ultrasound-guided core biopsy of liver lesion. Electronically Signed   By: Jacqulynn Cadet M.D.   On: 10/31/2019 12:09   DG CHEST PORT 1 VIEW  Result Date: 11/05/2019 CLINICAL DATA:  Acute respiratory distress EXAM: PORTABLE CHEST 1 VIEW COMPARISON:  06/17/2019 FINDINGS: Single frontal view of the chest demonstrates an enlarged cardiac silhouette. Lung volumes are diminished. Mild increased interstitial prominence with central vascular congestion consistent with mild fluid overload. Small right pleural effusion. No pneumothorax. IMPRESSION: 1. Findings suggesting mild interstitial edema. Electronically Signed   By: Randa Ngo M.D.   On: 11/05/2019 22:51   DG Swallowing Func-Speech Pathology  Result Date: 11/06/2019 Objective Swallowing Evaluation: Type of Study: MBS-Modified  Barium Swallow Study  Patient Details Name: DASHONNA CHAGNON MRN: [id number] Date of Birth: Nov 10, 1945 Today's Date: 11/06/2019 Time: SLP Start Time (ACUTE ONLY): 63 -SLP Stop Time (ACUTE ONLY): 1300 SLP Time Calculation (min) (ACUTE ONLY): 30 min Past Medical History: Past Medical History: Diagnosis Date  ADD (attention deficit disorder)   Anxiety   Arthritis   os  Breast cancer ([place])   Diabetes mellitus without complication ([place])   GERD (gastroesophageal reflux disease)   barretts  Headache   High cholesterol   Neck pain   Obesity   Personal history of radiation therapy 2005  Sleep apnea   cpap   last sleep study> 3 yrs  Swelling of both ankles   Weakness 06/2019 Past Surgical History: Past Surgical History: Procedure Laterality Date  ABDOMINAL HYSTERECTOMY    ANTERIOR CERVICAL DECOMP/DISCECTOMY FUSION N/A 02/15/2016  Procedure: Cervical three four-Cervical five-six, Cervical six-seven Anterior cervical decompression/diskectomy/fusion;  Surgeon: Kristeen Miss, MD;  Location: [facility];  Service: Neurosurgery;  Laterality: N/A;  bicept surgery    BREAST LUMPECTOMY    CHOLECYSTECTOMY    JOINT REPLACEMENT    PAROTID GLAND TUMOR EXCISION    TONSILLECTOMY    TOTAL KNEE ARTHROPLASTY  07/19/2012  Procedure: TOTAL KNEE ARTHROPLASTY;  Surgeon: Yvette Rack., MD;  Location: [place];  Service: Orthopedics;  Laterality: Right;  right total knee arthroplasty  TUBAL LIGATION   HPI: Pt is a 74 year old female admitted with medical decline and abnormal labs, found to have signs of malignant breast cancer with hepatic metastases. Admitted with subacute abdominal pain waxing waning over 3 months more acutely 3 weeks ago developed worsening weakness poor appetite nausea.  Has had difficulty swallowing with some question of a neck mass and thrust. CT shows Extensive mediastinal lymphadenopathy worrisome for  nodal metastases.  No data recorded Assessment / Plan / Recommendation CHL IP CLINICAL IMPRESSIONS  11/06/2019 Clinical Impression  Pt demonstrates mild oropharyngeal dysphagia, appearing primarily due to decreased awareness and mentation. Pt kept her eyes closed during session but followed commands as needed, When taking sips, there were instances of brief oral holding and also slight premature spillage. Airway closure was not complete at times during the swallow leading to instances of flash frank penetration. Typically no cough was elicited during these events, but as study progressed pt coughed more and mroe often, not appearing to be related to pharyngeal impairment. Esophageal sweep showed some distal residue and pill needed several bolus to fully clear. Pt is capable of regular solids and thin liquids but she should only eat and drink when fully alert. Will f/u for tolerance of diet and further modifications as needed.  SLP Visit Diagnosis Dysphagia, unspecified (R13.10) Attention and concentration deficit following -- Frontal lobe and executive function deficit following -- Impact on safety and function Mild aspiration risk;Risk for inadequate nutrition/hydration   CHL IP TREATMENT RECOMMENDATION 11/06/2019 Treatment Recommendations Therapy as outlined in treatment plan below   No flowsheet data found. CHL IP DIET RECOMMENDATION 11/06/2019 SLP Diet Recommendations -- Liquid Administration via Cup;Straw Medication Administration Whole meds with liquid Compensations -- Postural Changes Seated upright at 90 degrees;Remain semi-upright after after feeds/meals (Comment)   No flowsheet data found.  CHL IP FOLLOW UP RECOMMENDATIONS 11/06/2019 Follow up Recommendations 24 hour supervision/assistance   CHL IP FREQUENCY AND DURATION 11/06/2019 Speech Therapy Frequency (ACUTE ONLY) min 1 x/week Treatment Duration 1 week      CHL IP ORAL PHASE 11/06/2019 Oral Phase Impaired Oral - Pudding Teaspoon -- Oral - Pudding Cup -- Oral - Honey Teaspoon -- Oral - Honey Cup -- Oral - Nectar Teaspoon -- Oral - Nectar Cup -- Oral -  Nectar Straw -- Oral - Thin Teaspoon Premature spillage;Decreased bolus cohesion Oral - Thin Cup -- Oral - Thin Straw Delayed oral transit Oral - Puree Delayed oral transit Oral - Mech Soft -- Oral - Regular Delayed oral transit Oral - Multi-Consistency -- Oral - Pill -- Oral Phase - Comment --  CHL IP PHARYNGEAL PHASE 11/06/2019 Pharyngeal Phase Impaired Pharyngeal- Pudding Teaspoon -- Pharyngeal -- Pharyngeal- Pudding Cup -- Pharyngeal -- Pharyngeal- Honey Teaspoon -- Pharyngeal -- Pharyngeal- Honey Cup -- Pharyngeal -- Pharyngeal- Nectar Teaspoon -- Pharyngeal -- Pharyngeal- Nectar Cup -- Pharyngeal -- Pharyngeal- Nectar Straw -- Pharyngeal -- Pharyngeal- Thin Teaspoon Penetration/Aspiration before swallow;Penetration/Aspiration during swallow Pharyngeal Material enters airway, remains ABOVE vocal cords then ejected out Pharyngeal- Thin Cup -- Pharyngeal -- Pharyngeal- Thin Straw Penetration/Aspiration before swallow;Penetration/Aspiration during swallow Pharyngeal Material enters airway, CONTACTS cords and then ejected out;Material enters airway, remains ABOVE vocal cords then ejected out;Material does not enter airway Pharyngeal- Puree WFL Pharyngeal -- Pharyngeal- Mechanical Soft -- Pharyngeal -- Pharyngeal- Regular WFL Pharyngeal -- Pharyngeal- Multi-consistency -- Pharyngeal -- Pharyngeal- Pill WFL Pharyngeal -- Pharyngeal Comment --  No flowsheet data found. DeBlois, Katherene Ponto 11/06/2019, 2:48 PM              VAS [place] UPPER EXTREMITY VENOUS DUPLEX  Result Date: 11/07/2019 UPPER VENOUS STUDY  Indications: Edema Limitations: Body habitus, poor ultrasound/tissue interface and patient unable to cooperate. Patient position. Comparison Study: No prior study Performing Technologist: Maudry Mayhew MHA, RDMS, RVT, RDCS  Examination Guidelines: A complete evaluation includes B-mode imaging, spectral Doppler, color Doppler, and power Doppler as needed of all accessible portions of each vessel. Bilateral  testing is considered an integral part of a complete examination. Limited examinations for reoccurring indications may be performed as noted.  Right Findings: +----------+------------+---------+-----------+----------+--------------+  RIGHT      Compressible Phasicity Spontaneous Properties    Summary      +----------+------------+---------+-----------+----------+--------------+  Subclavian                                               Not visualized  +----------+------------+---------+-----------+----------+--------------+  Left Findings: +----------+------------+---------+-----------+----------+--------------+  LEFT       Compressible Phasicity Spontaneous Properties    Summary      +----------+------------+---------+-----------+----------+--------------+  IJV            Full        Yes        Yes                                +----------+------------+---------+-----------+----------+--------------+  Subclavian     Full        Yes        Yes                                +----------+------------+---------+-----------+----------+--------------+  Axillary       Full        Yes        Yes                                +----------+------------+---------+-----------+----------+--------------+  Brachial       Full        Yes        Yes                                +----------+------------+---------+-----------+----------+--------------+  Radial         Full                                                      +----------+------------+---------+-----------+----------+--------------+  Ulnar                                                    Not visualized  +----------+------------+---------+-----------+----------+--------------+  Cephalic       Full                                                      +----------+------------+---------+-----------+----------+--------------+  Basilic                                                  Not visualized   +----------+------------+---------+-----------+----------+--------------+  Summary:  Left: No  evidence of deep vein thrombosis in the upper extremity. No evidence of superficial vein thrombosis in the upper extremity. This study was limited due to technical limitations listed above.  *See table(s) above for measurements and observations.  Diagnosing physician: Servando Snare MD Electronically signed by Servando Snare MD on 11/07/2019 at 3:03:30 PM.    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 EXAM: ULTRASOUND AND FLUOROSCOPIC GUIDED PICC LINE INSERTION MEDICATIONS: 1% lidocaine to skin and subcutaneous tissue ANESTHESIA/SEDATION: None FLUOROSCOPY TIME:  Fluoroscopy Time:  42 seconds (7 mGy). COMPLICATIONS: None immediate. TECHNIQUE: The procedure, risks, benefits, and alternatives were explained to the patient and informed written consent was obtained. A timeout was performed prior to the initiation of the procedure. The right upper extremity was prepped with chlorhexidine in a sterile fashion, and a sterile drape was applied covering the operative field. Maximum barrier sterile technique with sterile gowns and gloves were used for the procedure. A timeout was performed prior to the initiation of the procedure. Local anesthesia was provided with 1% lidocaine. Under direct ultrasound guidance, the right brachial vein was accessed with a micropuncture kit after the overlying soft tissues were anesthetized with 1% lidocaine. An ultrasound image was saved for documentation purposes. A guidewire was advanced to the level of the superior caval-atrial junction for measurement purposes and the PICC line was cut to length. A peel-away sheath was placed and a 39 cm, 5 [place], dual lumen was inserted to level of the superior caval-atrial junction. A  post procedure spot fluoroscopic was obtained. The catheter easily aspirated and flushed and was sutured in place. A dressing was placed. The patient tolerated the procedure well without immediate post procedural complication. FINDINGS: After catheter placement, the tip lies within the superior cavoatrial junction the catheter aspirates and flushes normally and is ready for immediate use. IMPRESSION: Successful ultrasound and fluoroscopic guided placement of a right brachial vein approach, 39 cm, 5 French, dual lumen PICC with tip at the superior caval-atrial junction. The PICC line is ready for immediate use. Read by: Rowe Robert, PA-C Electronically Signed   By: Aletta Edouard M.D.   On: 11/10/2019 13:39   [place] EKG SITE RITE  Result Date: 11/07/2019 If Site Rite image not attached, placement could not be confirmed due to current cardiac rhythm.  US Abdomen Limited RUQ  Result Date: 11/14/2019 CLINICAL DATA:  74 year old female with transaminitis. History of breast cancer. EXAM: ULTRASOUND ABDOMEN LIMITED RIGHT UPPER QUADRANT COMPARISON:  CT of the abdomen pelvis dated 10/29/2019. FINDINGS: Gallbladder: Cholecystectomy. Common bile duct: Poorly visualized. Liver: The liver demonstrates a heterogeneous echotexture with innumerable hypoechoic lesions consistent with metastatic disease. 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 Portal vein is patent on color Doppler imaging with normal direction of blood flow towards the liver. Other: There is a small perihepatic ascites. Moderate right renal atrophy. IMPRESSION: 1. Extensive hepatic metastatic disease. 2. Small perihepatic ascites. 3. Patent main portal vein with hepatopetal flow. Electronically Signed   By: Anner Crete M.D.   On: 11/14/2019 16:37    ASSESSMENT AND PLAN: 1.    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  Primary peritoneal carcinoma favored given history of hysterectomy with negative pathology.  -10/29/2019 CEA  4.8, CA 19.9 441, AFP 2.1   -10/30/2019 CA 27.29 6728  -11/06/2019 CA-125 14,072  Cycle 1 Taxol/carboplatin 11/11/2019 2.  Elevated total bilirubin and LFTs 3.  AKI secondary to dehydration 4.  Hypercalcemia-status post Zometa and calcitonin, resolved 5.  Deconditioning 6.  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  Diabetes mellitus 8.  Temporal arteritis, status post treatment 9.  Migraine headaches 10.  Leg weakness-steroid myopathy?,  Paraneoplastic syndrome? 11.  Pancytopenia secondary to chemotherapy 12.  E. coli and Proteus urinary tract infection 11/11/2019  Ms Feijoo is now at day 9 following cycle 1 Taxol/carboplatin.  She has developed pancytopenia secondary to chemotherapy.  The platelet count is stable today and the white count is slightly higher.  She is on G-CSF support.  Ms. Cuadra was transferred to the ICU for management of sepsis syndrome.  Hypotension has improved and she is afebrile.  She is being treated for a urinary tract infection.  Blood cultures are negative to date.  Ms. Piekarski is alert and communicative this morning.  We discussed her current status and treatment options.  We discussed continuing supportive care measures, physical therapy, and considering further chemotherapy.  We also discussed hospice care.  Ms. Tennyson indicated to me and the nursing staff that she would like to enter hospice care.  We discussed home hospice and [facility].  She agrees to a hospice referral to consider [facility]. Recommendations: 1.  Continue IV antibiotics and G-CSF for now 2.  [facility] referral to consider [facility] 3.  Check WBC differential 4.  Continue discussions with family this morning regarding conversion to  comfort care-I attempted to contact her husband, but he did not answer. 5.  Please call oncology as needed, I will be out until 11/24/2019, Dr. Marin Olp will see her 11/20/2019 and 12/07/2019    LOS: 20 days   Betsy Coder 11/19/19

## 2019-11-19 NOTE — Progress Notes (Signed)
OT Cancellation Note  Patient Details Name: [name] MRN: [id number] DOB: 1946/01/04   Cancelled Treatment:    Reason Eval/Treat Not Completed: Patient declined, no reason specified  Lenward Chancellor 11/19/2019, 11:39 AM

## 2019-11-19 NOTE — Progress Notes (Signed)
Patient's clothing and personal belonging were given to her daughter Laverle Hobby.

## 2019-11-19 NOTE — Progress Notes (Signed)
Pt. seen for CPAP placement, currently on 3 lpm n/c and is wanting to remain on current oxygen therapy, RN made aware, Pt. Is [facility]osp[facility].

## 2019-11-19 NOTE — Progress Notes (Signed)
Report given to Carin Hock, RN  at [facility]. No questions at this time.

## 2019-11-19 NOTE — Progress Notes (Signed)
Met with patient and her husband. Goals are comfort and QOL. She does not want to pursue treatment of her incurable cancer - she feels her health is too poor overall to continue and she is suffering.She is complaining of severe diarrhea and skin blisters that are very painful. They are requesting hospice house at [facility]. Spoke with F[facility] Chute have bed available- working on transfer details. In the meantime I have change her existing inpatient orders to reflect a more comfort oriented approach.  Prognosis: <2 weeks  Started hydromorphone for pain, barrier paste and will give one dose of octreotide for diarrhea.  Lane Hacker, DO Palliative Medicine

## 2019-11-19 NOTE — Progress Notes (Signed)
Patient with poor vascular access okay to discharge to residential hospice with PICC line per Dr. Hilma Favors. IV team notified.

## 2019-11-19 NOTE — Progress Notes (Signed)
Palliative Medicine RN Note: Rec'd a call from pt's family member [place]. They spoke with Dr Benay Spice this morning about residential hospice, specifically [facility].   Monica Martinez called to make sure that we are aware that the family preference is for [facility]. They are still having family discussions, but are leaning towards hospice. We are not ready to make a referral yet.   I spoke with RNCM Suanne Marker to make sure she is aware of family preference if/when hospice referral comes in.   Marjie Skiff Maisley Hainsworth, RN, BSN, [facility] Palliative Medicine Team 11/19/2019 11:16 AM Office [phone]

## 2019-11-20 MED ORDER — HEPARIN SOD (PORK) LOCK FLUSH 100 UNIT/ML IV SOLN: 250.0000 [IU] | Freq: Every day | INTRAVENOUS | Status: DC

## 2019-11-20 MED ORDER — HEPARIN SOD (PORK) LOCK FLUSH 100 UNIT/ML IV SOLN
250.0000 [IU] | INTRAVENOUS | Status: DC | PRN
  Administered 2019-11-20: 250 [IU]
  Filled 2019-11-20 (×2): qty 2.5

## 2019-11-20 NOTE — Progress Notes (Signed)
Pt successfully transported with [facility] to [facility] facility. Daughter called with update. No questions at this time. Report called by previous shift.

## 2019-11-23 LAB — CULTURE, BLOOD (ROUTINE X 2)
Culture: NO GROWTH
Culture: NO GROWTH
Special Requests: ADEQUATE

## 2019-11-24 ENCOUNTER — Telehealth: Payer: Self-pay | Admitting: *Deleted

## 2019-11-24 NOTE — Telephone Encounter (Signed)
Reports that patient died on 12/06/2019 at 6:08 pm at [facility]. MD notified.

## 2019-12-18 DEATH — deceased

## 2021-03-20 IMAGING — MR MR HEAD WO/W CM
13 of 18 series · 36 of 48 positions shown · IV contrast (10 G)
Comparison: 09/30/2018

CLINICAL DATA: Weakness, metastases

EXAM:
MRI HEAD WITHOUT AND WITH CONTRAST
TECHNIQUE: Multiplanar, multiecho pulse sequences of the brain and surrounding
structures were obtained without and with intravenous contrast.
CONTRAST:  10mL GADAVIST GADOBUTROL 1 MMOL/ML IV SOLN

[Series 2: DWI · axial · 3.0mm · 0.94mm/px · z∈[-57,+89]mm · 6 of 100 slices shown (1 of 6)]
[im 1/100]
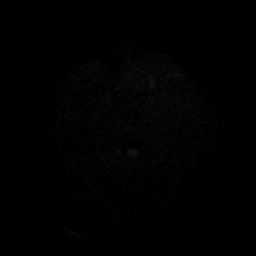
[im 20/100]
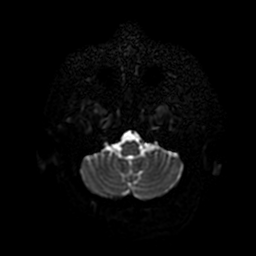
[im 40/100]
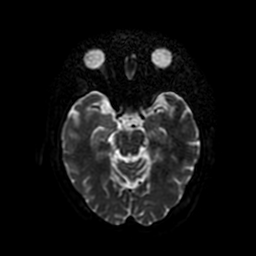
[im 60/100]
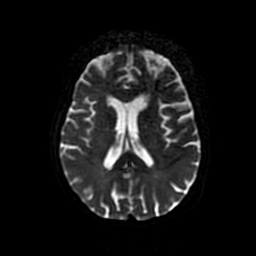
[im 80/100]
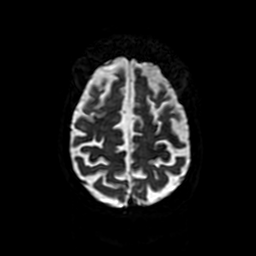
[im 100/100]
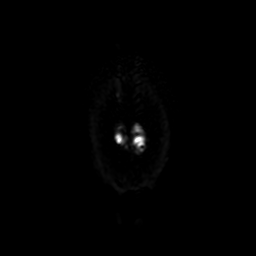

[Series 4: DWI · coronal · 4.0mm · 0.94mm/px · 3 of 74 slices shown (2 of 6)]
[im 1/74]
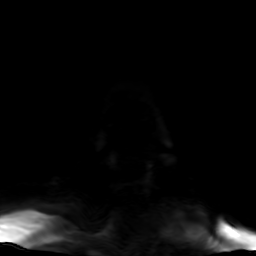
[im 37/74]
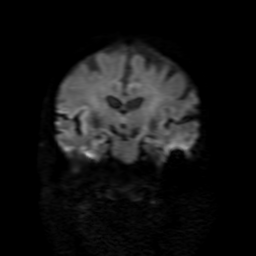
[im 74/74]
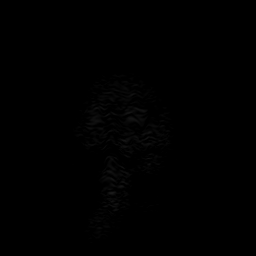

[Series 5: DWI · axial · 3.0mm · 0.94mm/px · z∈[-57,+89]mm · 5 of 100 slices shown (3 of 6)]
[im 1/100]
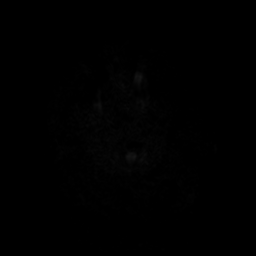
[im 25/100]
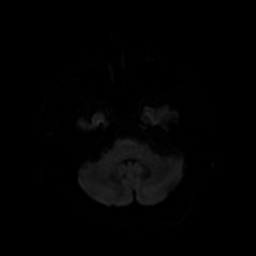
[im 50/100]
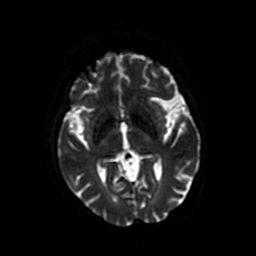
[im 75/100]
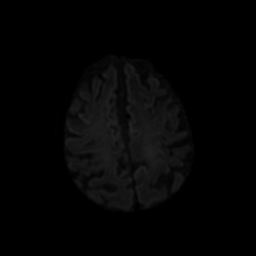
[im 100/100]
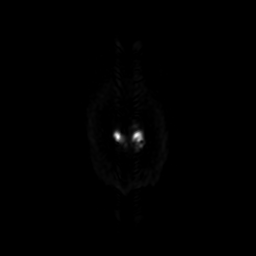

[Series 6: FLAIR · sagittal · 5.0mm · 0.23mm/px · 1 of 24 slices shown (1 of 2)]
[im 1/24]
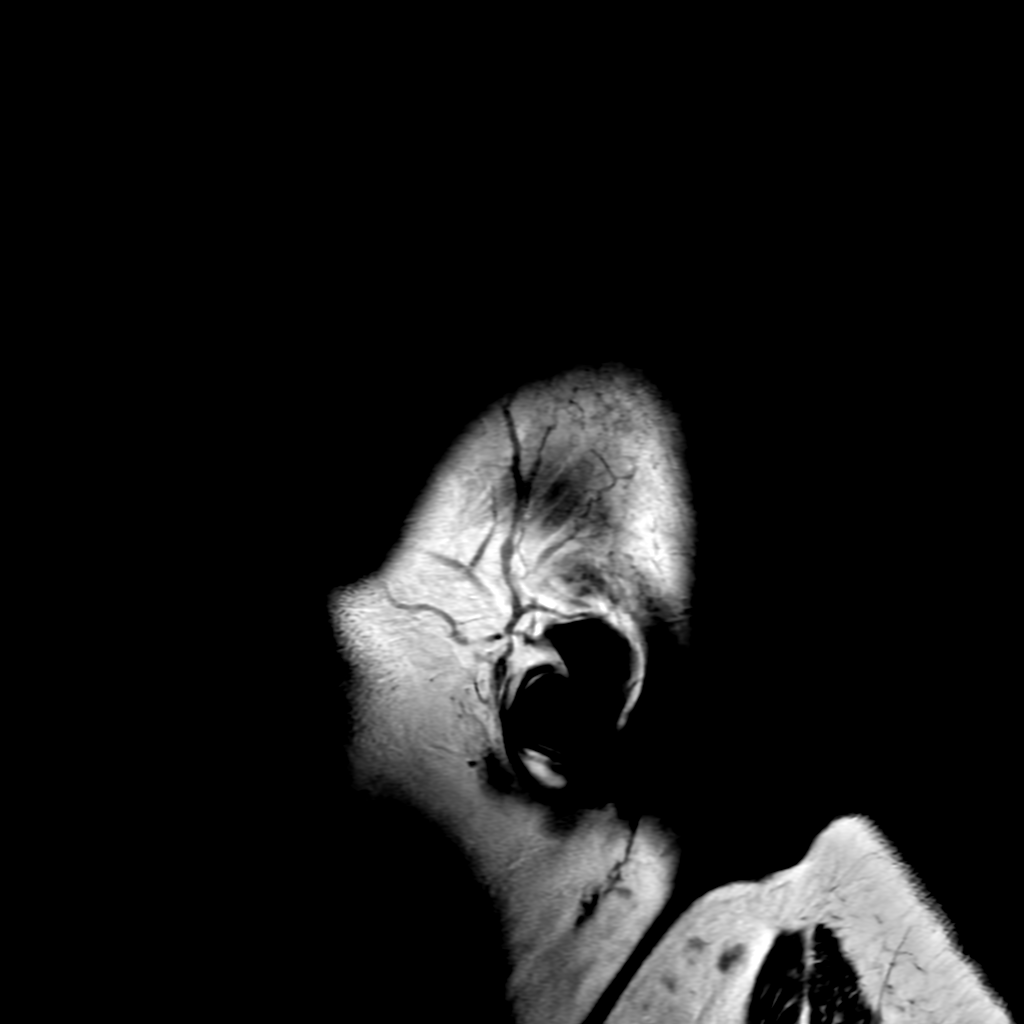

[Series 7: T2 · axial · 5.0mm · 0.23mm/px · 1 of 26 slices shown (1 of 2)]
[im 1/26]
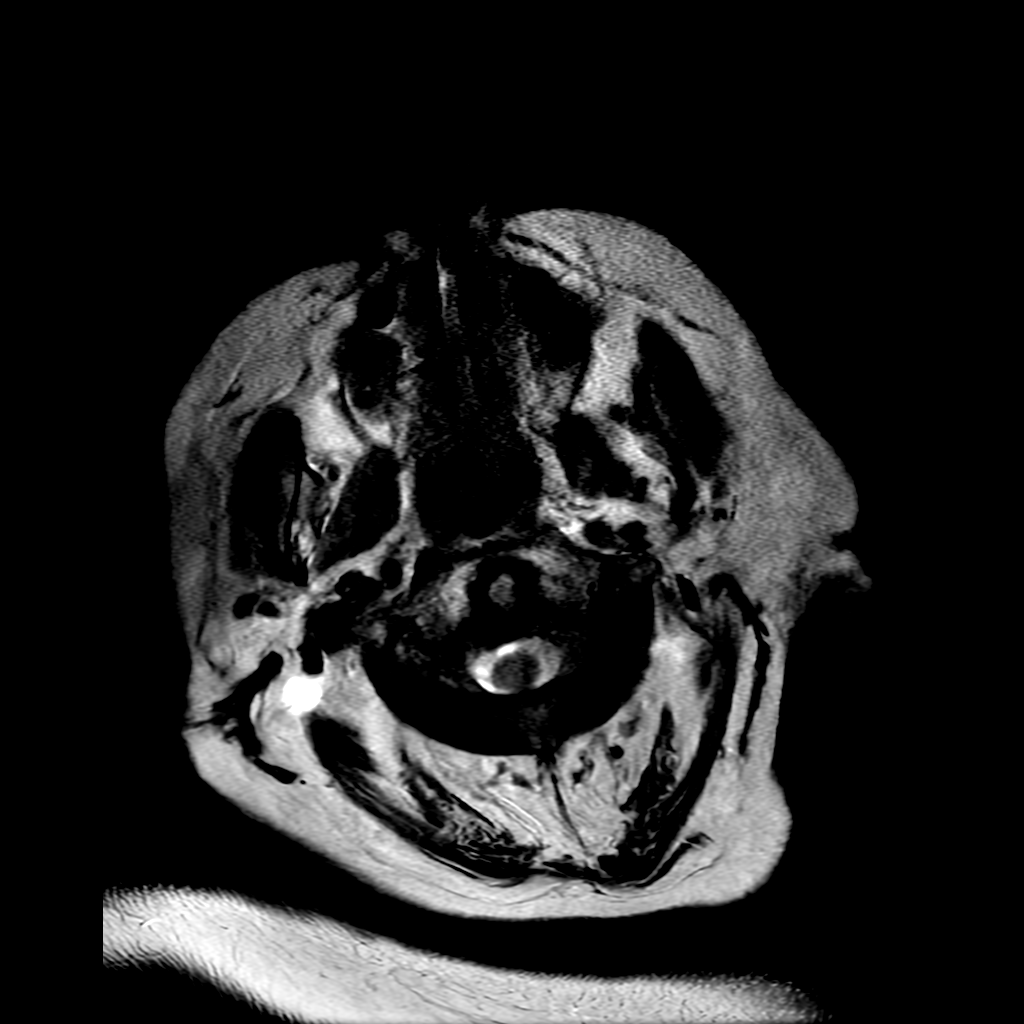

[Series 8: FLAIR · axial · 3.0mm · 0.47mm/px · 1 of 26 slices shown (2 of 2)]
[im 1/26]
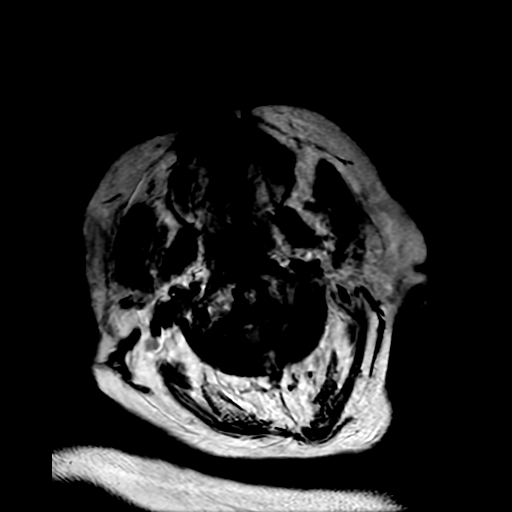

[Series 11: T2 · coronal · 5.0mm · 0.39mm/px · 1 of 29 slices shown (2 of 2)]
[im 1/29]
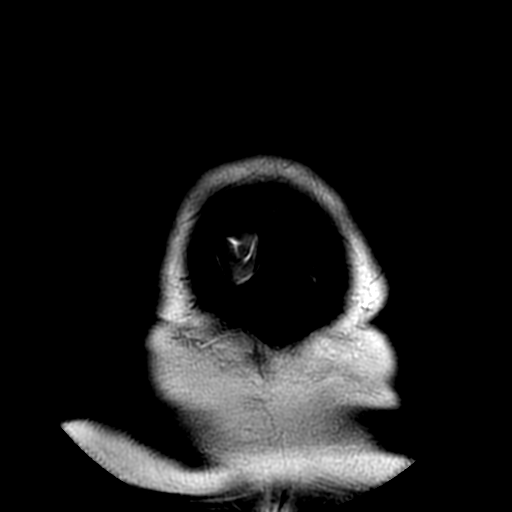

[Series 210: DWI · axial · 3.0mm · 0.94mm/px · z∈[-57,+89]mm · 4 of 99 slices shown (4 of 6)]
[im 1/99]
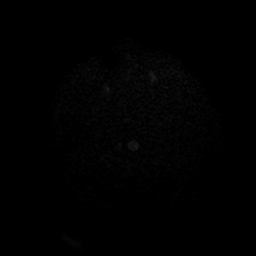
[im 33/99]
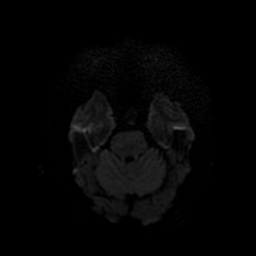
[im 66/99]
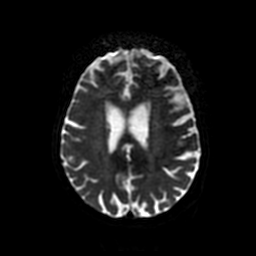
[im 99/99]
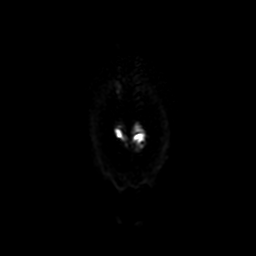

[Series 250: ADC · axial · 3.0mm · 0.94mm/px · z∈[-57,+89]mm · 2 of 50 slices shown (1 of 3)]
[im 1/50]
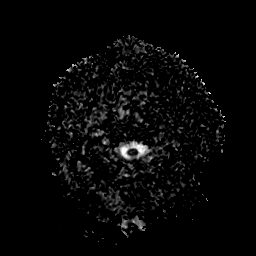
[im 50/50]
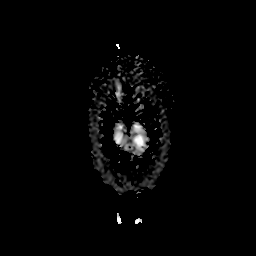

[Series 410: DWI · coronal · 4.0mm · 0.94mm/px · 3 of 73 slices shown (5 of 6)]
[im 1/73]
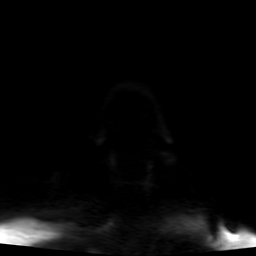
[im 37/73]
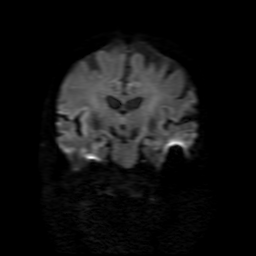
[im 73/73]
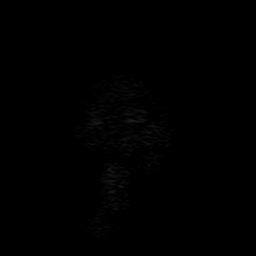

[Series 450: ADC · coronal · 4.0mm · 0.94mm/px · 2 of 37 slices shown (2 of 3)]
[im 1/37]
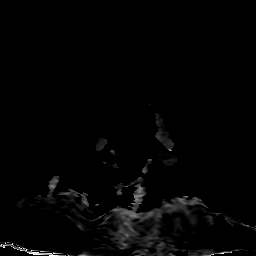
[im 37/37]
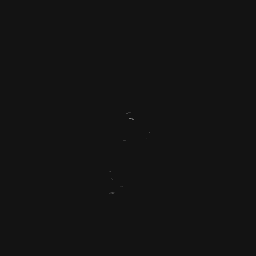

[Series 510: DWI · axial · 3.0mm · 0.94mm/px · z∈[-57,+89]mm · 5 of 100 slices shown (6 of 6)]
[im 1/100]
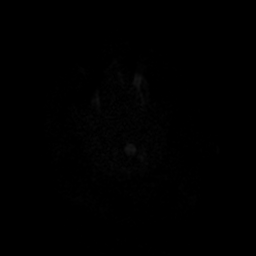
[im 25/100]
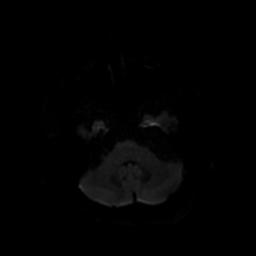
[im 50/100]
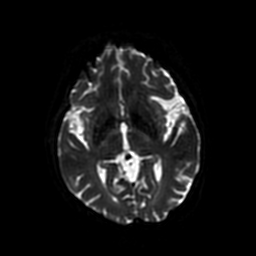
[im 75/100]
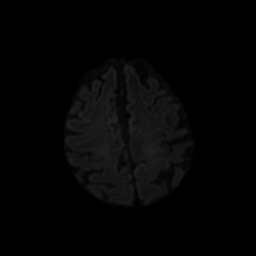
[im 100/100]
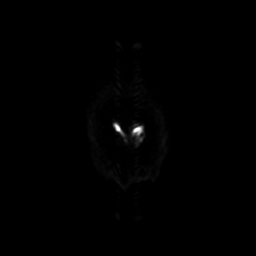

[Series 550: ADC · axial · 3.0mm · 0.94mm/px · z∈[-57,+89]mm · 2 of 50 slices shown (3 of 3)]
[im 1/50]
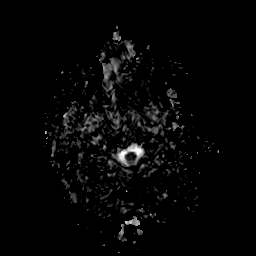
[im 50/50]
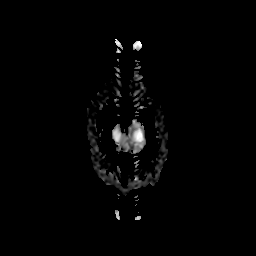

[36 of 48 positions shown; findings below may reference images not displayed]

FINDINGS: Motion artifact is present

Brain: There is no acute infarction or intracranial hemorrhage.
There is no intracranial mass, mass effect, or edema. There is no
hydrocephalus or extra-axial fluid collection. Ventricles and sulci
are stable in size and configuration. Patchy T2 hyperintensity in
the supratentorial and pontine white matter is nonspecific but
probably reflects mild chronic microvascular ischemic changes. No
abnormal enhancement.

Vascular: Major vessel flow voids at the skull base are preserved.

Skull and upper cervical spine: Normal marrow signal is preserved.
There is susceptibility artifact related to partially imaged
cervical spine fusion.

Sinuses/Orbits: Paranasal sinuses are aerated. Orbits are
unremarkable.

Other: Sella is unremarkable.  Mastoid air cells are clear.
IMPRESSION: Suboptimal evaluation due to motion artifact. No evidence of
intracranial metastatic disease. No acute infarction or hemorrhage.

## 2021-03-20 IMAGING — US US BIOPSY CORE LIVER
1 series · 6 of 6 positions shown · non-contrast
Comparison: none

INDICATION: 73-year-old female with multifocal hepatic lesions concerning for
metastatic disease. She presents for ultrasound-guided biopsy of the
same.

[Series 1: us biopsy (liver) · 6 of 6 slices shown]
[im 1/6]
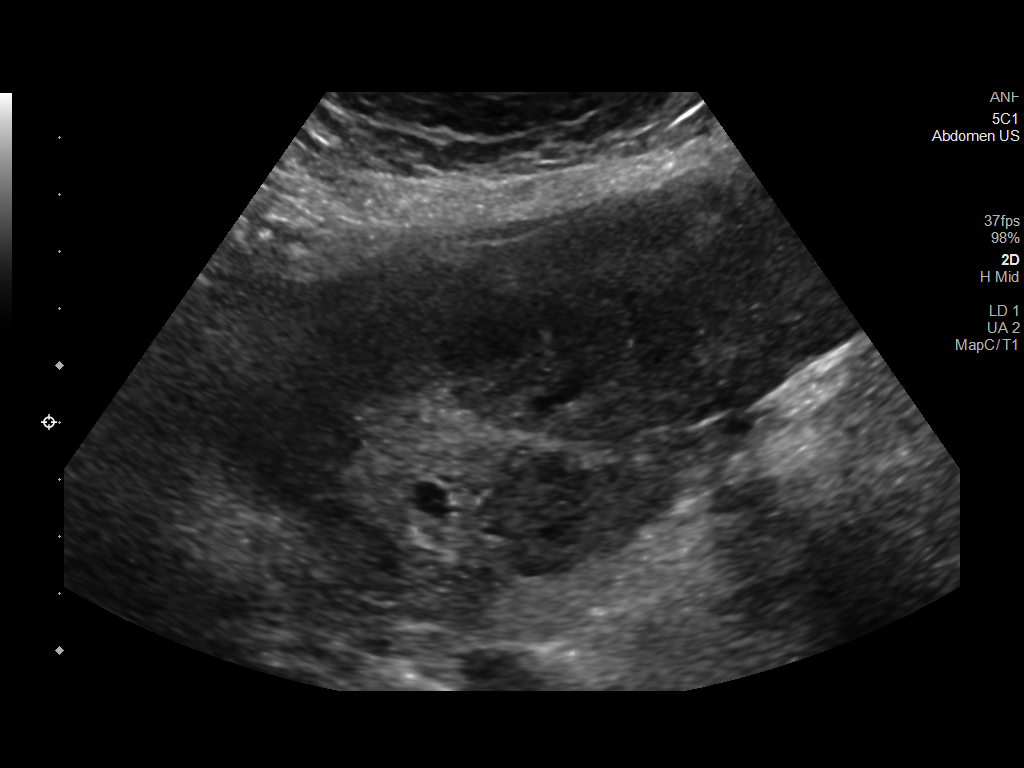
[im 2/6]
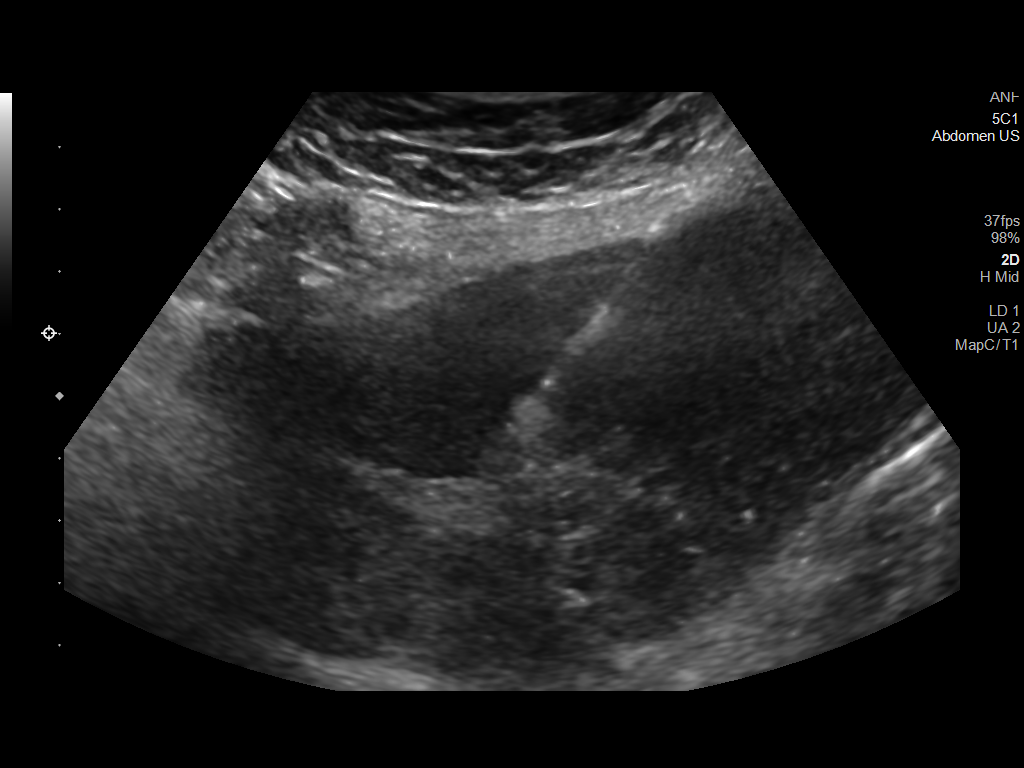
[im 3/6]
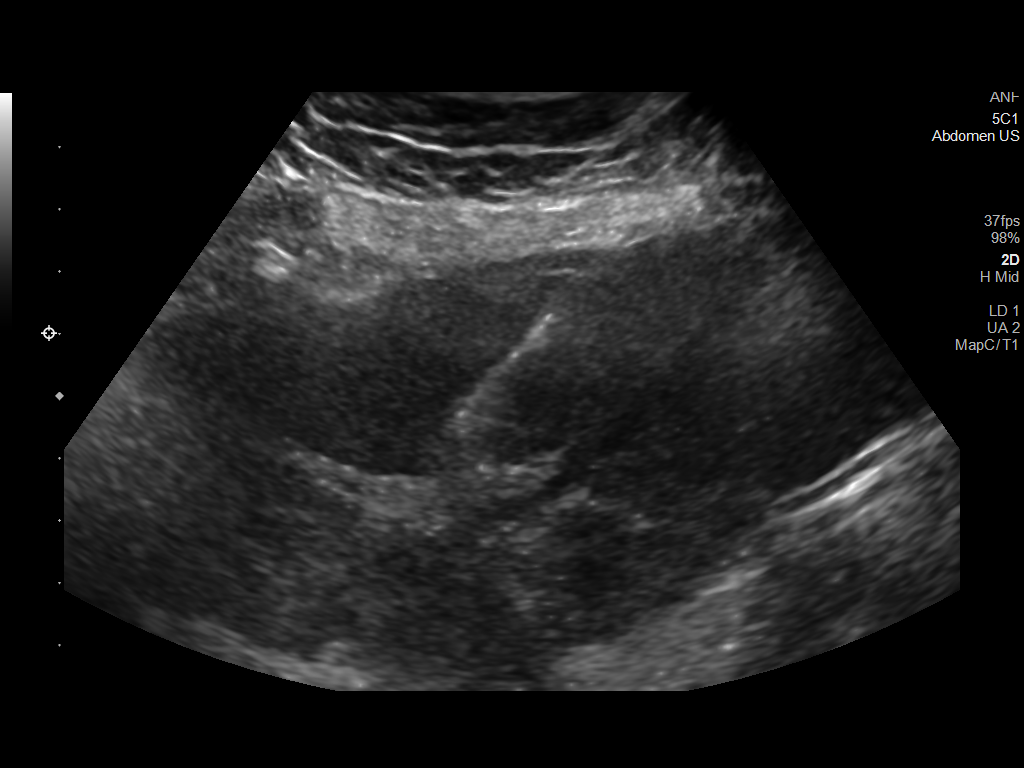
[im 4/6]
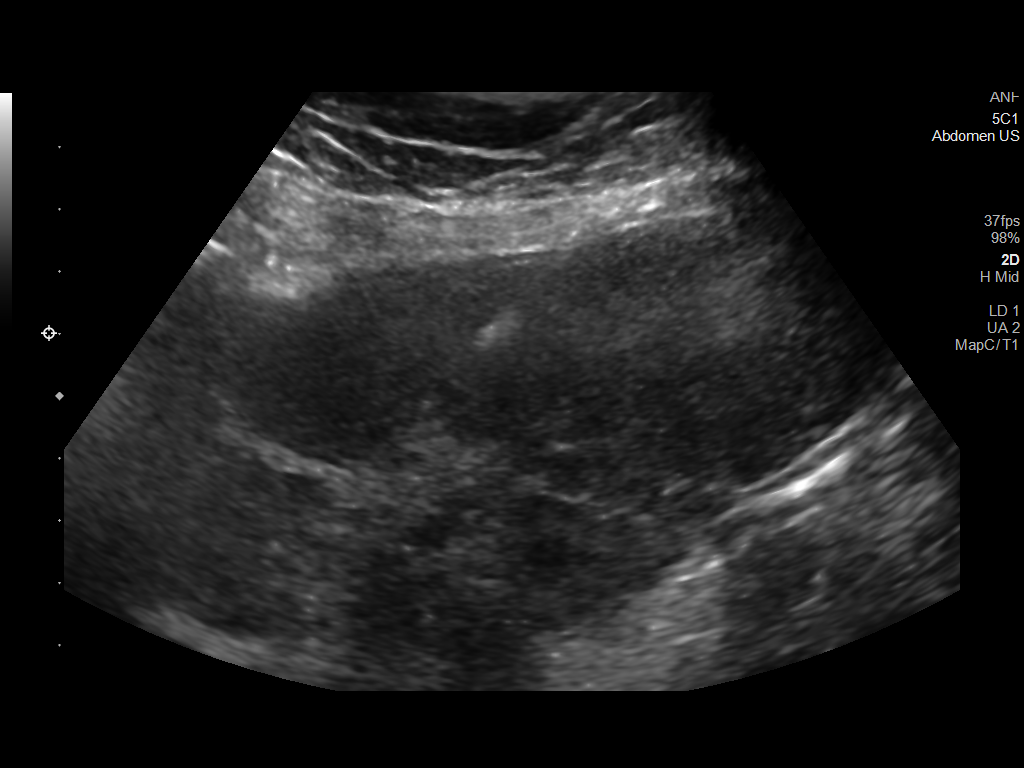
[im 5/6]
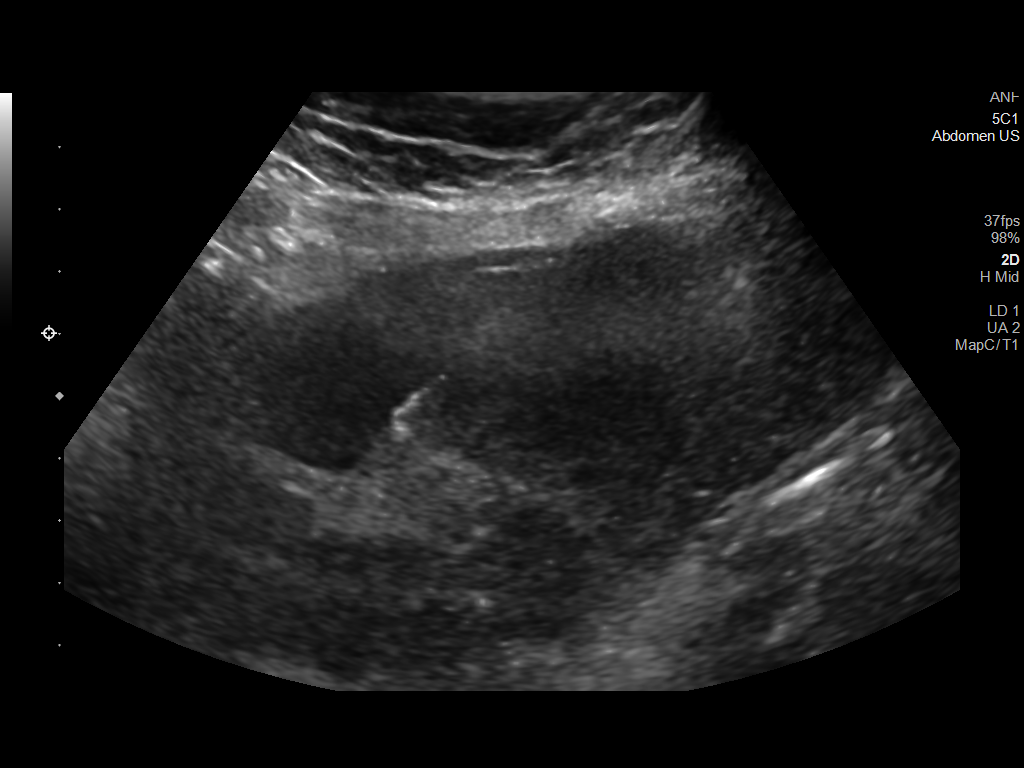
[im 6/6]
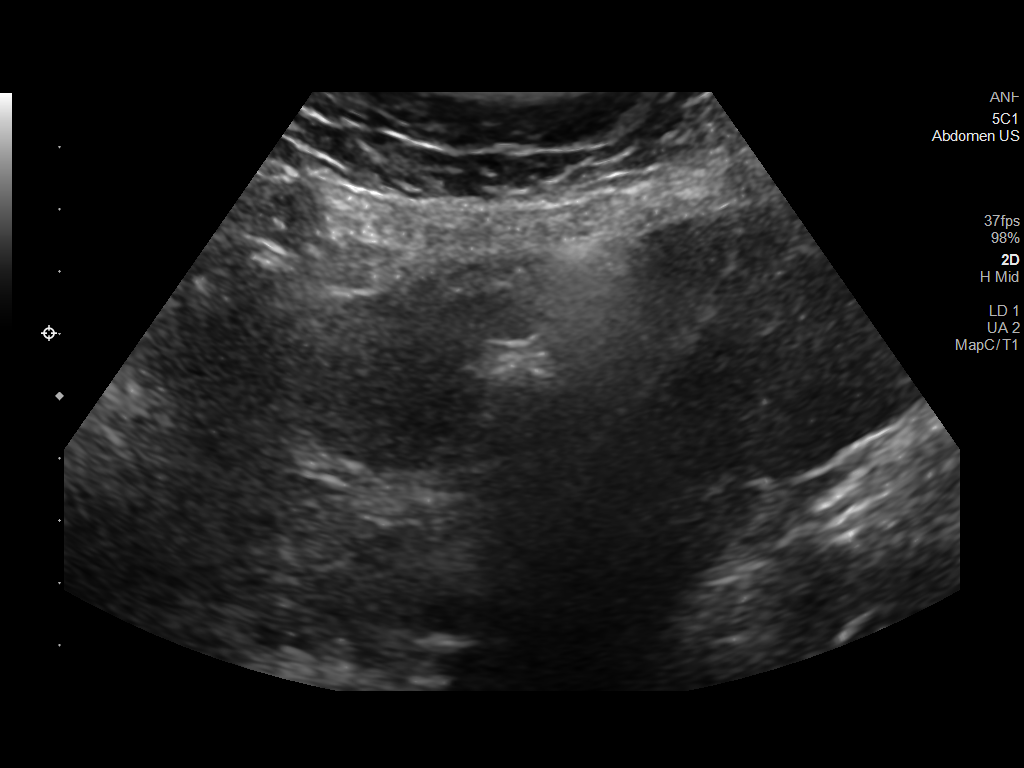

[6 of 6 positions shown; findings below may reference images not displayed]

EXAM:
ULTRASOUND BIOPSY CORE LIVER

MEDICATIONS:
None.

ANESTHESIA/SEDATION:
Moderate (conscious) sedation was employed during this procedure. A
total of Versed 1 mg and Fentanyl 50 mcg was administered
intravenously.

Moderate Sedation Time: 13 minutes. The patient's level of
consciousness and vital signs were monitored continuously by
radiology nursing throughout the procedure under my direct
supervision.

FLUOROSCOPY TIME:  None.

COMPLICATIONS:
None immediate.

PROCEDURE:
Informed written consent was obtained from the patient after a
thorough discussion of the procedural risks, benefits and
alternatives. All questions were addressed. A timeout was performed
prior to the initiation of the procedure.

The liver was interrogated with ultrasound. There are innumerable
hypoechoic solid lesions scattered throughout the liver. A suitable
lesion in the left hepatic lobe was identified. The overlying skin
was sterilely prepped and draped in the standard fashion. Local
anesthesia was attained by infiltration with 1% lidocaine. A small
dermatotomy was made. Under real-time ultrasound guidance, multiple
18 gauge core biopsies were obtained using the [employer] Ri automated
biopsy device. Biopsies were performed coaxially through a 17 gauge
introducer needle. As the introducer needle was withdrawn, the
biopsy tract was embolized with a Gel-Foam slurry.

Post ultrasound imaging demonstrates no evidence of immediate
complication.
IMPRESSION: Technically successful ultrasound-guided core biopsy of liver
lesion.
